# Patient Record
Sex: Male | Born: 1965
Health system: Southern US, Community
[De-identification: ages and names within clinical notes are randomized; demographics above are authoritative.]

## PROBLEM LIST (undated history)

## (undated) DIAGNOSIS — M1990 Unspecified osteoarthritis, unspecified site: Secondary | ICD-10-CM

## (undated) DIAGNOSIS — M25519 Pain in unspecified shoulder: Secondary | ICD-10-CM

## (undated) DIAGNOSIS — Z860100 Personal history of colon polyps, unspecified: Secondary | ICD-10-CM

## (undated) DIAGNOSIS — Z8709 Personal history of other diseases of the respiratory system: Secondary | ICD-10-CM

## (undated) DIAGNOSIS — Z8739 Personal history of other diseases of the musculoskeletal system and connective tissue: Secondary | ICD-10-CM

## (undated) DIAGNOSIS — K5140 Inflammatory polyps of colon without complications: Secondary | ICD-10-CM

## (undated) DIAGNOSIS — T7840XA Allergy, unspecified, initial encounter: Secondary | ICD-10-CM

## (undated) DIAGNOSIS — J45909 Unspecified asthma, uncomplicated: Secondary | ICD-10-CM

## (undated) DIAGNOSIS — S62101A Fracture of unspecified carpal bone, right wrist, initial encounter for closed fracture: Secondary | ICD-10-CM

## (undated) DIAGNOSIS — K625 Hemorrhage of anus and rectum: Secondary | ICD-10-CM

## (undated) DIAGNOSIS — K529 Noninfective gastroenteritis and colitis, unspecified: Secondary | ICD-10-CM

## (undated) HISTORY — DX: Pain in unspecified shoulder: M25.519

## (undated) HISTORY — PX: POLYPECTOMY: SHX149

## (undated) HISTORY — PX: OTHER SURGICAL HISTORY: SHX169

## (undated) HISTORY — PX: ROTATOR CUFF REPAIR: SHX139

## (undated) HISTORY — DX: Inflammatory polyps of colon without complications: K51.40

## (undated) HISTORY — PX: COLONOSCOPY: SHX174

## (undated) HISTORY — DX: Hemorrhage of anus and rectum: K62.5

## (undated) HISTORY — DX: Allergy, unspecified, initial encounter: T78.40XA

## (undated) HISTORY — DX: Noninfective gastroenteritis and colitis, unspecified: K52.9

---

## 1998-03-09 ENCOUNTER — Emergency Department (HOSPITAL_COMMUNITY): Admission: EM | Admit: 1998-03-09 | Discharge: 1998-03-09 | Payer: Self-pay | Admitting: Emergency Medicine

## 1999-08-07 ENCOUNTER — Emergency Department (HOSPITAL_COMMUNITY): Admission: EM | Admit: 1999-08-07 | Discharge: 1999-08-07 | Payer: Self-pay | Admitting: Emergency Medicine

## 1999-08-10 ENCOUNTER — Emergency Department (HOSPITAL_COMMUNITY): Admission: EM | Admit: 1999-08-10 | Discharge: 1999-08-10 | Payer: Self-pay | Admitting: Emergency Medicine

## 2000-05-28 HISTORY — PX: ANKLE SURGERY: SHX546

## 2001-01-01 ENCOUNTER — Ambulatory Visit (HOSPITAL_BASED_OUTPATIENT_CLINIC_OR_DEPARTMENT_OTHER): Admission: RE | Admit: 2001-01-01 | Discharge: 2001-01-01 | Payer: Self-pay | Admitting: Orthopedic Surgery

## 2004-11-03 ENCOUNTER — Emergency Department (HOSPITAL_COMMUNITY): Admission: EM | Admit: 2004-11-03 | Discharge: 2004-11-03 | Payer: Self-pay | Admitting: Emergency Medicine

## 2006-12-16 ENCOUNTER — Emergency Department (HOSPITAL_COMMUNITY): Admission: EM | Admit: 2006-12-16 | Discharge: 2006-12-16 | Payer: Self-pay | Admitting: Emergency Medicine

## 2008-05-07 ENCOUNTER — Ambulatory Visit (HOSPITAL_BASED_OUTPATIENT_CLINIC_OR_DEPARTMENT_OTHER): Admission: RE | Admit: 2008-05-07 | Discharge: 2008-05-07 | Payer: Self-pay | Admitting: Orthopedic Surgery

## 2008-05-07 HISTORY — PX: SHOULDER ARTHROSCOPY W/ SUBACROMIAL DECOMPRESSION AND DISTAL CLAVICLE EXCISION: SHX2401

## 2008-05-28 HISTORY — PX: SHOULDER SURGERY: SHX246

## 2009-05-10 ENCOUNTER — Emergency Department (HOSPITAL_COMMUNITY): Admission: EM | Admit: 2009-05-10 | Discharge: 2009-05-10 | Payer: Self-pay | Admitting: Emergency Medicine

## 2010-02-01 ENCOUNTER — Encounter (HOSPITAL_BASED_OUTPATIENT_CLINIC_OR_DEPARTMENT_OTHER): Admission: RE | Admit: 2010-02-01 | Discharge: 2010-02-07 | Payer: Self-pay | Admitting: Internal Medicine

## 2010-08-29 LAB — POCT CARDIAC MARKERS
CKMB, poc: <1 ng/mL — ABNORMAL LOW (ref 1.0–8.0)
Myoglobin, poc: 69.9 ng/mL (ref 12–200)
Troponin i, poc: <0.05 ng/mL (ref 0.00–0.09)

## 2010-08-29 LAB — TROPONIN I: Troponin I: 0.01 ng/mL (ref 0.00–0.06)

## 2010-08-29 LAB — D-DIMER, QUANTITATIVE: D-Dimer, Quant: 0.23 ug/mL-FEU (ref 0.00–0.48)

## 2010-10-07 ENCOUNTER — Emergency Department (HOSPITAL_COMMUNITY)
Admission: EM | Admit: 2010-10-07 | Discharge: 2010-10-08 | Disposition: A | Discharge status: Home / Self Care | Payer: Medicaid Other | Attending: Emergency Medicine | Admitting: Emergency Medicine

## 2010-10-07 DIAGNOSIS — K644 Residual hemorrhoidal skin tags: Secondary | ICD-10-CM | POA: Insufficient documentation

## 2010-10-07 DIAGNOSIS — K6289 Other specified diseases of anus and rectum: Secondary | ICD-10-CM | POA: Insufficient documentation

## 2010-10-10 NOTE — Op Note (Signed)
Randy Wilson, Randy Wilson                  ACCOUNT NO.:  [id number]   MEDICAL RECORD NO.:  [id number]          PATIENT TYPE:  AMB   LOCATION:  DSC                          FACILITY:  [facility]   PHYSICIAN:  Alta Corning, M.D.   DATE OF BIRTH:  Nov 18, 1965   DATE OF PROCEDURE:  05/07/2008  DATE OF DISCHARGE:                               OPERATIVE REPORT   PREOPERATIVE DIAGNOSES:  1. Impingement and acromioclavicular joint arthritis.  2. Suspect of the labral tear.   POSTOPERATIVE DIAGNOSES:  1. Impingement and acromioclavicular joint arthritis.  2. Suspect of the labral tear.  3. Superior labral tear anterior to posterior.   PERTINENT PROCEDURE:  1. Arthroscopic subacromial decompression for lateral and posterior      compartment.  2. Arthroscopic distal clavicle resection for the anterior      compartment.  3. Arthroscopic debridement of labral tear superior with anterior to      posterior extensions within the glenohumeral joint.   SURGEON:  Alta Corning, M.D.   ASSISTANT:  Gary Fleet, P.A.   ANESTHESIA:  General.   BRIEF HISTORY:  Randy Wilson is a gentleman with a long history of having  had significant left shoulder pain which was treated conservatively for  a period of time.  Injection therapy had controlled his pain quite well  and because of continuing complaints of pain and failure of conservative  care, he was also taken to the operating room for subacromial  decompression, distal clavicle resection, and evaluation of rotator  cuff.   PROCEDURE:  The patient was taken to the operating room after adequate  anesthesia was obtained under general anesthetic.  The patient was  placed supine on the operating table.  The left arm was prepped and  draped in usual sterile fashion.  After he was moved to the beach chair  position, all bony prominences were well padded.  After routine prep and  drape as outlined, the arthroscopic examination was performed and showed  that there  was an obvious labral tear anterior.  This went around under  the biceps tendon into the posterior superior region.  The biceps tendon  appear to be well anchored and could not be pushed down into the joint.  Once this was completed, attention was turned towards the rotator cuff  undersurface which had no evidence of full thickness tearing or even  high-grade partial.  The glenohumeral joint was then inspected and noted  to have no significant arthritic change.  After this, attention was  turned out to the glenohumeral joint and the subacromial space, an  arthroscopic subacromial decompression was performed from a lateral and  posterior compartment.  Once it was completed, attention was then turned  towards the clavicle where the distal clavicle was excised over 18 mm.  Once this was completed, an ArthroCare bovie was used to burn the end of  the clavicle to try to prevent any kind of regrowth.  Once this was  completed, attention was turned to the bursa.  The subtotal bursectomy  was performed and the rotator cuff was evaluated from  top side.  There  was some partial thickness tearing from the top side, but this was  probed at length and did not appear to have any full thickness extension  or even high-grade partial extension.  At this point, the shoulder was  copiously and thoroughly irrigated and suctioned dry.  The shoulder  portals were closed with a bandage.  Sterile compressive dressing was  applied and the patient taken to recovery and was noted to be in  satisfactory condition.  Estimated blood loss for this procedure was  none.      Alta Corning, M.D.  Electronically Signed     JLG/MEDQ  D:  05/07/2008  T:  05/07/2008  Job:  [id number]

## 2010-10-13 NOTE — Op Note (Signed)
. [facility]  Patient:    Randy Wilson, Randy Wilson                         MRN: [id number] Adm. Date:  [id number] Attending:  Debroah Baller                           Operative Report  Good morning, this is Frederik Pear dictating an operative note on [name]. Assuming this is the same person handling the dictation, the one I dictated on [name], medical record number [id number], was actually [name], medical record number [phone].  Please change the identity on the dictation that was on [name] to [employer]. DD:  01/01/01 TD:  01/02/01 Job: [id number]

## 2011-01-22 ENCOUNTER — Encounter (INDEPENDENT_AMBULATORY_CARE_PROVIDER_SITE_OTHER): Payer: Self-pay | Admitting: General Surgery

## 2011-01-24 ENCOUNTER — Ambulatory Visit (INDEPENDENT_AMBULATORY_CARE_PROVIDER_SITE_OTHER): Payer: Medicaid Other | Admitting: General Surgery

## 2011-01-24 ENCOUNTER — Encounter (INDEPENDENT_AMBULATORY_CARE_PROVIDER_SITE_OTHER): Payer: Self-pay | Admitting: General Surgery

## 2011-01-24 VITALS — BP 126/84 | HR 62 | Temp 97.0°F | Ht 74.25 in | Wt 222.0 lb

## 2011-01-24 DIAGNOSIS — K648 Other hemorrhoids: Secondary | ICD-10-CM

## 2011-01-24 NOTE — Progress Notes (Signed)
Chief Complaint  Patient presents with  . Other    new pt -eval of hemorrhoids     HPI Randy Wilson is a 45 y.o. male.  This patient is referred by Dr. Kenton Kingfisher for evaluation of bleeding and painful hemorrhoids. He has a history of what sounds like a thrombosed hemorrhoid which was treated with incision and drainage approximately 10 or 11 years ago. He has been fine since this time until approximately 4 months ago. At that time he began having increased bleeding and pain with bowel movements as well as bright red blood into the bowl with bowel movements. He had been taking Colace once a day and moves his bowels every other day. If he does not take Colace he has hard stools. He has been taking Advil for discomfort as well as taking suppositories, Tucks, as well as sitz baths. He has no family history of colon cancer and has had a normal colonoscopy roughly 6 years ago. HPI  Past Medical History  Diagnosis Date  . Asthma   . Shoulder pain   . Hemorrhoids   . Rectal bleeding     Past Surgical History  Procedure Date  . Shoulder surgery 2010    bone spurs removed   . Ankle surgery 2002    bone spurs removed    Family History  Problem Relation Age of Onset  . Asthma Mother 58    asthma attack     Social History History  Substance Use Topics  . Smoking status: Former Smoker    Quit date: 01/21/2006  . Smokeless tobacco: Former Systems developer  . Alcohol Use: 3.6 oz/week    6 Cans of beer per week    No Known Allergies  Current Outpatient Prescriptions  Medication Sig Dispense Refill  . PREDNISONE PO Take by mouth daily. Taking for poison ivy       . albuterol (PROAIR HFA) 108 (90 BASE) MCG/ACT inhaler Inhale 2 puffs into the lungs every 4 (four) hours as needed.        . triamcinolone (KENALOG) 0.1 % cream Apply topically 2 (two) times daily.          Review of Systems Review of Systems  Constitutional: Negative.   HENT: Negative.   Eyes: Negative.   Respiratory: Negative.     Cardiovascular: Negative.   Gastrointestinal: Positive for constipation and blood in stool.  Genitourinary: Negative.   Skin: Negative.   Endo/Heme/Allergies: Negative.   Psychiatric/Behavioral: Negative.   All other systems reviewed and are negative.    Blood pressure 126/84, pulse 62, temperature 97 F (36.1 C), height 6' 2.25" (1.886 m), weight 222 lb (100.699 kg).  Physical Exam Physical Exam  Constitutional: He is oriented to person, place, and time. He appears well-developed and well-nourished. No distress.  HENT:  Head: Normocephalic and atraumatic.  Mouth/Throat: No oropharyngeal exudate.  Eyes: Conjunctivae and EOM are normal. Right eye exhibits no discharge. Left eye exhibits no discharge. No scleral icterus.  Neck: Normal range of motion. No tracheal deviation present.  Cardiovascular: Normal rate, regular rhythm and normal heart sounds.   Respiratory: Effort normal and breath sounds normal. No respiratory distress. He has no wheezes.  GI: Soft. Bowel sounds are normal. He exhibits distension. He exhibits no mass. There is no tenderness. There is no guarding.  Genitourinary:       He has some external hemorrhoids visible greatest on the left lateral column. He is tight rectal tone and no internal masses no evidence of  bleeding no evidence of fissure tender rectal exam.  Musculoskeletal: Normal range of motion.  Neurological: He is alert and oriented to person, place, and time.  Skin: Skin is warm and dry. No rash noted. He is not diaphoretic. No erythema. No pallor.  Psychiatric: He has a normal mood and affect. His behavior is normal. Judgment and thought content normal.     Data Reviewed   Assessment    Internal and external hemorrhoids with bleeding and pain. At this point he has tried conservative management and is still having significant symptoms. I did recommend that he increase his fiber intake to 20-30 g of fiber daily as well as increase his water intake  and recommended that he increase his Colace twice daily if needed in order to titrate the effect to one bowel movement per day. I also discussed with him the options for surgical management of hemorrhoids. I think that he be a reasonable candidate for hemorrhoidectomy at this time. I did offer hemorrhoidectomy but at this time, I recommend that he try these conservative measures and if he desires to proceed with hemorrhoidectomy, then to give me a call.     Plan    He will continue with current conservative management and if he desires to proceed with hemorrhoidectomy and he will call me back in 3-4 weeks if he is ready for procedure       [place], [place] 01/24/2011, 9:36 AM

## 2013-09-23 ENCOUNTER — Ambulatory Visit: Payer: No Typology Code available for payment source

## 2013-09-23 ENCOUNTER — Ambulatory Visit (INDEPENDENT_AMBULATORY_CARE_PROVIDER_SITE_OTHER): Payer: No Typology Code available for payment source | Admitting: Family Medicine

## 2013-09-23 VITALS — BP 126/74 | HR 60 | Temp 98.2°F | Resp 17 | Ht 72.5 in | Wt 232.0 lb

## 2013-09-23 DIAGNOSIS — M940 Chondrocostal junction syndrome [Tietze]: Secondary | ICD-10-CM

## 2013-09-23 DIAGNOSIS — R079 Chest pain, unspecified: Secondary | ICD-10-CM

## 2013-09-23 NOTE — Patient Instructions (Signed)
It seems that you have strained the joint between your sternum and ribs (costochondriits).  Continue to use aleve as needed.  If your pain is not better in about one week or if it gets worse please let me know.    Costochondritis Costochondritis, sometimes called Tietze syndrome, is a swelling and irritation (inflammation) of the tissue (cartilage) that connects your ribs with your breastbone (sternum). It causes pain in the chest and rib area. Costochondritis usually goes away on its own over time. It can take up to 6 weeks or longer to get better, especially if you are unable to limit your activities. CAUSES  Some cases of costochondritis have no known cause. Possible causes include:  Injury (trauma).  Exercise or activity such as lifting.  Severe coughing. SIGNS AND SYMPTOMS  Pain and tenderness in the chest and rib area.  Pain that gets worse when coughing or taking deep breaths.  Pain that gets worse with specific movements. DIAGNOSIS  Your health care provider will do a physical exam and ask about your symptoms. Chest X-rays or other tests may be done to rule out other problems. TREATMENT  Costochondritis usually goes away on its own over time. Your health care provider may prescribe medicine to help relieve pain. HOME CARE INSTRUCTIONS   Avoid exhausting physical activity. Try not to strain your ribs during normal activity. This would include any activities using chest, abdominal, and side muscles, especially if heavy weights are used.  Apply ice to the affected area for the first 2 days after the pain begins.  Put ice in a plastic bag.  Place a towel between your skin and the bag.  Leave the ice on for 20 minutes, 2 3 times a day.  Only take over-the-counter or prescription medicines as directed by your health care provider. SEEK MEDICAL CARE IF:  You have redness or swelling at the rib joints. These are signs of infection.  Your pain does not go away despite rest or  medicine. SEEK IMMEDIATE MEDICAL CARE IF:   Your pain increases or you are very uncomfortable.  You have shortness of breath or difficulty breathing.  You cough up blood.  You have worse chest pains, sweating, or vomiting.  You have a fever or persistent symptoms for more than 2 3 days.  You have a fever and your symptoms suddenly get worse. MAKE SURE YOU:   Understand these instructions.  Will watch your condition.  Will get help right away if you are not doing well or get worse. Document Released: 02/21/2005 Document Revised: 03/04/2013 Document Reviewed: 12/16/2012 [facility] Patient Information 2014 [place].

## 2013-09-23 NOTE — Progress Notes (Signed)
[facility] [address], [place] [address] [phone]  Date:  09/23/2013   Name:  Randy Wilson   DOB:  08-21-1965   MRN:  [id number]  PCP:  Shirline Frees, MD    Chief Complaint: Chest Pain   History of Present Illness:  Randy Wilson is a 48 y.o. very pleasant male patient who presents with the following:  Today is Monday. He is here today with pain in his chest for about 3 days.  It feels "like I popped something loose."  He was working out on Saturday and did some chest work.  Started to notice the pain on Sunday or Monday.  The pain is present when he moves his chest or moves his arms.  Sitting at rest and still is ok.   He tried some aleve which did help He does not have any SOB He is generally healthy No history of heart problems.  No family history of heart problems.  He is quite active, usually able to exercise (run, etc) without any problem.  He has not noted any exertional CP.   Quit smoking nearly 10 years ago  There are no active problems to display for this patient.   Past Medical History  Diagnosis Date  . Asthma   . Shoulder pain   . Hemorrhoids   . Rectal bleeding     Past Surgical History  Procedure Laterality Date  . Shoulder surgery  2010    bone spurs removed   . Ankle surgery  2002    bone spurs removed    History  Substance Use Topics  . Smoking status: Former Smoker    Quit date: 01/21/2006  . Smokeless tobacco: Former Systems developer  . Alcohol Use: 3.6 oz/week    6 Cans of beer per week    Family History  Problem Relation Age of Onset  . Asthma Mother 72    asthma attack     No Known Allergies  Medication list has been reviewed and updated.  No current outpatient prescriptions on file prior to visit.   No current facility-administered medications on file prior to visit.    Review of Systems:  As per HPI- otherwise negative.   Physical Examination: Filed Vitals:   09/23/13 1022  BP: 126/74  Pulse: 60   Temp: 98.2 F (36.8 C)  Resp: 17   Filed Vitals:   09/23/13 1022  Height: 6' 0.5" (1.842 m)  Weight: 232 lb (105.235 kg)   Body mass index is 31.02 kg/(m^2). Ideal Body Weight: Weight in (lb) to have BMI = 25: 186.5  GEN: WDWN, NAD, Non-toxic, A & O x 3, looks well, fit build HEENT: Atraumatic, Normocephalic. Neck supple. No masses, No LAD. Ears and Nose: No external deformity. CV: RRR, No M/G/R. No JVD. No thrill. No extra heart sounds. PULM: CTA B, no wheezes, crackles, rhonchi. No retractions. No resp. distress. No accessory muscle use. He indicates pain over the left costochondral joints.  He is able to reproduce this pain by abducting his right arm.  No redness, swelling or evidence of cellulitis. EXTR: No c/c/e NEURO Normal gait.  PSYCH: Normally interactive. Conversant. Not depressed or anxious appearing.  Calm demeanor.   EKG: NSR, no ST elevation or depression  [facility] reading (PRIMARY) by  Dr. Lorelei Pont. SWN:IOEVOJJK Sternum: negative  CHEST 2 VIEW  COMPARISON: 05/10/2009  FINDINGS: Heart and mediastinal contours are within normal limits. No focal opacities or effusions. No acute bony abnormality.  IMPRESSION: No active cardiopulmonary disease.  STERNUM - 2+ VIEW  COMPARISON: Chest x-ray performed today.  FINDINGS: The sternum is actually better visualized on today's chest x-ray. No focal bony abnormality. No fracture or focal bone lesion.  IMPRESSION: No acute bony abnormality. d Assessment and Plan: Chest pain - Plan: EKG 12-Lead, DG Chest 2 View, DG Sternum  Costochondritis discussed in detail with pt.  Suspect his pain is MSK in origin, likely costochondritis.  He will continue to use aleve as needed, and expect he will be feeling better in the next few days.  Discussed the less likely possibility of an acute heart problem.  Offered to have him seen at the ED for a CP rule- out.  He declines at this time but will seek care for any worsening or  different CP  Signed Lamar Blinks, MD

## 2014-08-06 ENCOUNTER — Encounter: Payer: Self-pay | Admitting: Gastroenterology

## 2014-08-23 ENCOUNTER — Encounter (HOSPITAL_COMMUNITY): Payer: Self-pay | Admitting: Emergency Medicine

## 2014-08-23 ENCOUNTER — Emergency Department (HOSPITAL_COMMUNITY)
Admission: EM | Admit: 2014-08-23 | Discharge: 2014-08-24 | Disposition: A | Discharge status: Home / Self Care | Payer: 59 | Attending: Emergency Medicine | Admitting: Emergency Medicine

## 2014-08-23 DIAGNOSIS — Z87891 Personal history of nicotine dependence: Secondary | ICD-10-CM | POA: Insufficient documentation

## 2014-08-23 DIAGNOSIS — R519 Headache, unspecified: Secondary | ICD-10-CM

## 2014-08-23 DIAGNOSIS — Z8719 Personal history of other diseases of the digestive system: Secondary | ICD-10-CM | POA: Insufficient documentation

## 2014-08-23 DIAGNOSIS — R51 Headache: Secondary | ICD-10-CM | POA: Insufficient documentation

## 2014-08-23 DIAGNOSIS — Z8739 Personal history of other diseases of the musculoskeletal system and connective tissue: Secondary | ICD-10-CM | POA: Diagnosis not present

## 2014-08-23 DIAGNOSIS — J45909 Unspecified asthma, uncomplicated: Secondary | ICD-10-CM | POA: Diagnosis not present

## 2014-08-23 DIAGNOSIS — Z79899 Other long term (current) drug therapy: Secondary | ICD-10-CM | POA: Diagnosis not present

## 2014-08-23 NOTE — ED Notes (Signed)
Pt states has had a headache since yesterday, has taken multiple medications w/ no relief.

## 2014-08-23 NOTE — ED Provider Notes (Signed)
CSN: [id number]     Arrival date & time 08/23/14  2027 History   First MD Initiated Contact with Patient 08/23/14 2336     Chief Complaint  Patient presents with  . Headache     (Consider location/radiation/quality/duration/timing/severity/associated sxs/prior Treatment) HPI  This is a 49 year old male without a history of headaches. He is here with a headache that began yesterday evening about 7:30. The onset was fairly sudden. He describes the headache as feeling like someone is drilling a hole in the side of his head. It is located in the left temple and periorbital region. He taken Advil and Tylenol without relief. He describes the pain as severe. It is associated with photophobia, blurred vision and nausea but no vomiting. He denies any focal neurologic deficits.  Past Medical History  Diagnosis Date  . Asthma   . Shoulder pain   . Hemorrhoids   . Rectal bleeding    Past Surgical History  Procedure Laterality Date  . Shoulder surgery  2010    bone spurs removed   . Ankle surgery  2002    bone spurs removed   Family History  Problem Relation Age of Onset  . Asthma Mother 36    asthma attack    History  Substance Use Topics  . Smoking status: Former Smoker    Quit date: 01/21/2006  . Smokeless tobacco: Former Systems developer  . Alcohol Use: 3.6 oz/week    6 Cans of beer per week    Review of Systems  All other systems reviewed and are negative.   Allergies  Review of patient's allergies indicates no known allergies.  Home Medications   Prior to Admission medications   Medication Sig Start Date End Date Taking? Authorizing Provider  cetirizine (ZYRTEC) 10 MG tablet Take 10 mg by mouth daily.    Historical Provider, MD   BP 143/93 mmHg  Pulse 58  Temp(Src) 97.7 F (36.5 C) (Oral)  Resp 18  SpO2 99%   Physical Exam  General: Well-developed, well-nourished male in no acute distress; appearance consistent with age of record HENT: normocephalic; atraumatic Eyes:  pupils equal, round and reactive to light; extraocular muscles intact; photophobia Neck: supple Heart: regular rate and rhythm Lungs: clear to auscultation bilaterally Abdomen: soft; nondistended; nontender; bowel sounds present Extremities: No deformity; full range of motion; pulses normal Neurologic: Awake, alert and oriented; motor function intact in all extremities and symmetric; no facial droop; normal coordination speech; negative Romberg; normal finger to nose Skin: Warm and dry Psychiatric: Flat affect    ED Course  Procedures (including critical care time)   MDM  Nursing notes and vitals signs, including pulse oximetry, reviewed.  Summary of this visit's results, reviewed by myself:  Imaging Studies: Ct Head Wo Contrast  08/24/2014   CLINICAL DATA:  Acute onset of left temporal and parietal headache for 2 days. Initial encounter.  EXAM: CT HEAD WITHOUT CONTRAST  TECHNIQUE: Contiguous axial images were obtained from the base of the skull through the vertex without intravenous contrast.  COMPARISON:  None.  FINDINGS: There is no evidence of acute infarction, mass lesion, or intra- or extra-axial hemorrhage on CT.  The posterior fossa, including the cerebellum, brainstem and fourth ventricle, is within normal limits. The third and lateral ventricles, and basal ganglia are unremarkable in appearance. The cerebral hemispheres are symmetric in appearance, with normal gray-white differentiation. No mass effect or midline shift is seen.  There is no evidence of fracture; visualized osseous structures are unremarkable in appearance.  The orbits are within normal limits. The paranasal sinuses and mastoid air cells are well-aerated. No significant soft tissue abnormalities are seen.  IMPRESSION: Unremarkable noncontrast CT of the head.   Electronically Signed   By: Garald Balding M.D.   On: 08/24/2014 01:58   2:02 AM Headache resolved after IV fluids, IV Reglan and Benadryl and oxygen 100% by  nonrebreather. His relief with oxygen suggest cluster headache.     Shanon Rosser, MD 08/24/14 [phone]

## 2014-08-23 NOTE — ED Notes (Signed)
Pt states that he has a headache that started last night. Blurred vision. Alert and oriented.

## 2014-08-24 ENCOUNTER — Emergency Department (HOSPITAL_COMMUNITY): Payer: 59

## 2014-08-24 ENCOUNTER — Ambulatory Visit (AMBULATORY_SURGERY_CENTER): Payer: Self-pay | Admitting: *Deleted

## 2014-08-24 VITALS — Ht 74.0 in | Wt 241.4 lb

## 2014-08-24 DIAGNOSIS — Z1211 Encounter for screening for malignant neoplasm of colon: Secondary | ICD-10-CM

## 2014-08-24 MED ORDER — SODIUM CHLORIDE 0.9 % IV BOLUS (SEPSIS)
1000.0000 mL | Freq: Once | INTRAVENOUS | Status: AC
  Administered 2014-08-24: 1000 mL via INTRAVENOUS

## 2014-08-24 MED ORDER — DIPHENHYDRAMINE HCL 50 MG/ML IJ SOLN
25.0000 mg | Freq: Once | INTRAMUSCULAR | Status: AC
  Administered 2014-08-24: 25 mg via INTRAVENOUS
  Filled 2014-08-24: qty 1

## 2014-08-24 MED ORDER — METOCLOPRAMIDE HCL 5 MG/ML IJ SOLN
10.0000 mg | Freq: Once | INTRAMUSCULAR | Status: AC
  Administered 2014-08-24: 10 mg via INTRAVENOUS
  Filled 2014-08-24: qty 2

## 2014-08-24 MED ORDER — NA SULFATE-K SULFATE-MG SULF 17.5-3.13-1.6 GM/177ML PO SOLN: ORAL | Status: DC

## 2014-08-24 NOTE — Progress Notes (Signed)
No egg or soy allergy  No anesthesia or intubation problems per pt  No diet medications taken

## 2014-09-07 ENCOUNTER — Other Ambulatory Visit: Payer: Self-pay

## 2014-09-07 ENCOUNTER — Encounter: Payer: Self-pay | Admitting: Gastroenterology

## 2014-09-07 ENCOUNTER — Ambulatory Visit (AMBULATORY_SURGERY_CENTER): Payer: 59 | Admitting: Gastroenterology

## 2014-09-07 VITALS — BP 144/84 | HR 66 | Temp 97.7°F | Resp 19 | Ht 74.0 in | Wt 241.0 lb

## 2014-09-07 DIAGNOSIS — D120 Benign neoplasm of cecum: Secondary | ICD-10-CM

## 2014-09-07 DIAGNOSIS — K635 Polyp of colon: Secondary | ICD-10-CM | POA: Diagnosis not present

## 2014-09-07 DIAGNOSIS — Z1211 Encounter for screening for malignant neoplasm of colon: Secondary | ICD-10-CM

## 2014-09-07 MED ORDER — SODIUM CHLORIDE 0.9 % IV SOLN: 500.0000 mL | INTRAVENOUS | Status: DC

## 2014-09-07 NOTE — Progress Notes (Signed)
Called to room to assist during endoscopic procedure.  Patient ID and intended procedure confirmed with present staff. Received instructions for my participation in the procedure from the performing physician.  

## 2014-09-07 NOTE — Op Note (Signed)
[facility]  [address]. [place], [address]   COLONOSCOPY PROCEDURE REPORT  PATIENT: Randy Wilson, Randy Wilson  MR#: [id number] BIRTHDATE: [birth year] , 49  yrs. old GENDER: male ENDOSCOPIST: Inda Castle, MD REFERRED CA:REQJEAD Tisovec, M.D. PROCEDURE DATE:  09/07/2014 PROCEDURE:   Colonoscopy with biopsy, Colonoscopy with snare polypectomy, and Colonoscopy, screening First Screening Colonoscopy - Avg.  risk and is 50 yrs.  old or older Yes.  Prior Negative Screening - Now for repeat screening. N/A  History of Adenoma - Now for follow-up colonoscopy & has been > or = to 3 yrs.  N/A ASA CLASS:   Class I INDICATIONS:Colorectal Neoplasm Risk Assessment for this procedure is average risk. MEDICATIONS: Monitored anesthesia care and Propofol 450 mg IV  DESCRIPTION OF PROCEDURE:   After the risks benefits and alternatives of the procedure were thoroughly explained, informed consent was obtained.  The digital rectal exam revealed no abnormalities of the rectum.   The LB [id number]  endoscope was introduced through the anus and advanced to the ileum. No adverse events experienced.   The quality of the prep was (Suprep was used) good.  The instrument was then slowly withdrawn as the colon was fully examined.      COLON FINDINGS: A polypoid shaped mass measuring 3 X 5cm in size with friable surfaces was found at the ileocecal valve.  A polypectomy was performed in a piecemeal fashion using snare cautery.  The resection was incomplete but the polyp tissue was completely retrieved and sent to histology.  there appeared to be extension into the terminal ileum.  Nodular mucosa was noted in the terminal ileum.  Biopsies were taken. Internal hemorrhoids were found. The remainder of the colon was normal. Retroflexed views revealed no abnormalities. The time to cecum = 3.0 Withdrawal time = 28.4   The scope was withdrawn and the procedure completed. COMPLICATIONS: There were  no immediate complications.  ENDOSCOPIC IMPRESSION: mass (polyp) at the ileocecal valve extending into the terminal ileum Internal hemorrhoids  RECOMMENDATIONS: surgical consultation for resection Colonoscopy one year To consider band ligation of internal hemorrhoids at a later date  eSigned:  Inda Castle, MD 09/07/2014 2:22 PM   cc:   PATIENT NAME:  Nichalos, Brenton MR#: [id number]

## 2014-09-07 NOTE — Progress Notes (Signed)
Awake and alert. Pleased with MAC, Report to RN

## 2014-09-07 NOTE — Patient Instructions (Addendum)
YOU HAD AN ENDOSCOPIC PROCEDURE TODAY AT [facility]:   Refer to the procedure report that was given to you for any specific questions about what was found during the examination.  If the procedure report does not answer your questions, please call your gastroenterologist to clarify.  If you requested that your care partner not be given the details of your procedure findings, then the procedure report has been included in a sealed envelope for you to review at your convenience later.  YOU SHOULD EXPECT: Some feelings of bloating in the abdomen. Passage of more gas than usual.  Walking can help get rid of the air that was put into your GI tract during the procedure and reduce the bloating. If you had a lower endoscopy (such as a colonoscopy or flexible sigmoidoscopy) you may notice spotting of blood in your stool or on the toilet paper. If you underwent a bowel prep for your procedure, you may not have a normal bowel movement for a few days.  Please Note:  You might notice some irritation and congestion in your nose or some drainage.  This is from the oxygen used during your procedure.  There is no need for concern and it should clear up in a day or so.  SYMPTOMS TO REPORT IMMEDIATELY:   Following lower endoscopy (colonoscopy or flexible sigmoidoscopy):  Excessive amounts of blood in the stool  Significant tenderness or worsening of abdominal pains  Swelling of the abdomen that is new, acute  Fever of 100F or higher   For urgent or emergent issues, a gastroenterologist can be reached at any hour by calling [phone].   DIET: Your first meal following the procedure should be a small meal and then it is ok to progress to your normal diet. Heavy or fried foods are harder to digest and may make you feel nauseous or bloated.  Likewise, meals heavy in dairy and vegetables can increase bloating.  Drink plenty of fluids but you should avoid alcoholic beverages for 24  hours.  ACTIVITY:  You should plan to take it easy for the rest of today and you should NOT DRIVE or use heavy machinery until tomorrow (because of the sedation medicines used during the test).    FOLLOW UP: Our staff will call the number listed on your records the next business day following your procedure to check on you and address any questions or concerns that you may have regarding the information given to you following your procedure. If we do not reach you, we will leave a message.  However, if you are feeling well and you are not experiencing any problems, there is no need to return our call.  We will assume that you have returned to your regular daily activities without incident.  If any biopsies were taken you will be contacted by phone or by letter within the next 1-3 weeks.  Please call us at [phone] if you have not heard about the biopsies in 3 weeks.    SIGNATURES/CONFIDENTIALITY: You and/or your care partner have signed paperwork which will be entered into your electronic medical record.  These signatures attest to the fact that that the information above on your After Visit Summary has been reviewed and is understood.  Full responsibility of the confidentiality of this discharge information lies with you and/or your care-partner.  Recommendations Discharge instructions given to patient and/or care partner. Next colonoscopy in 1 year. Appointment with surgeon to be scheduled. Polyp handout provided.

## 2014-09-08 ENCOUNTER — Telehealth: Payer: Self-pay | Admitting: *Deleted

## 2014-09-08 ENCOUNTER — Telehealth: Payer: Self-pay | Admitting: Gastroenterology

## 2014-09-08 NOTE — Telephone Encounter (Signed)
  Follow up Call-  Call back number 09/07/2014  Post procedure Call Back phone  # [phone]  Permission to leave phone message Yes     Patient questions:  Do you have a fever, pain , or abdominal swelling? No. Pain Score  0 *  Have you tolerated food without any problems? Yes.    Have you been able to return to your normal activities? Yes.    Do you have any questions about your discharge instructions: Diet   No. Medications  No. Follow up visit  No.  Do you have questions or concerns about your Care? No.  Actions: * If pain score is 4 or above: No action needed, pain <4.

## 2014-09-08 NOTE — Telephone Encounter (Signed)
Left message for the patient. His PCP has been notified of the need for the referral to [facility], Dr Jonita Albee and his endoscopy report has been faxed. Awaiting notification of the referral from Maudie Mercury (2 message left on her voicemail).

## 2014-09-09 NOTE — Telephone Encounter (Signed)
PCP has done the referral for patient's insurance to assure his visits and surgery will be covered by his plan. Referral coordinator at [place] Becky Sax) is working on the appointments. She will call when she has this scheduled and I will notify the patient.

## 2014-09-10 ENCOUNTER — Other Ambulatory Visit: Payer: Self-pay | Admitting: *Deleted

## 2014-09-10 ENCOUNTER — Telehealth: Payer: Self-pay | Admitting: *Deleted

## 2014-09-10 DIAGNOSIS — K635 Polyp of colon: Secondary | ICD-10-CM

## 2014-09-10 NOTE — Telephone Encounter (Signed)
Dr Deatra Ina, Eustaquio Maize is in the middle of scheduling this appointment for patient, Patient is having insurance issues with the referral. Im confused about this. So the patient dont have cancer????  Im forwarding this message to you and beth.

## 2014-09-10 NOTE — Telephone Encounter (Signed)
Spoke with Dr Deatra Ina,   Scheduled patient an office visit for 09/17/2014   Dr Deatra Ina to discuss colon polyp and biospy with patient  Patient to have labs next week. Beth will contact the patient   Per Dr Deatra Ina, Pers[facility] Proud referral appointment with Dr Marcello Moores in May

## 2014-09-10 NOTE — Telephone Encounter (Signed)
==  View-only below this line===  ----- Message -----    From: Inda Castle, MD    Sent: 09/08/2014   9:27 AM      To: Tania Ade, RN, Oda Kilts, CMA  Shirlean Mylar, Please cancel this patient's appointment with Dr. Marcello Moores.  Inform him that there are no cancer cells or precancerous changes on the biopsies and that I am going to talk with other doctors about what, if anything, to do about the polyp.  He needs a CBC and office visit.  Manuela Schwartz, He is placed this patient on the GI cancer conference list for next week.  Diagnosis-inflammatory polyp

## 2014-09-10 NOTE — Telephone Encounter (Signed)
Patient is aware of an appointment with Dr Marcello Moores on 09/30/14 at 3:40 pm. See telephone note of 09/10/14.

## 2014-09-10 NOTE — Telephone Encounter (Signed)
Dr I see that a referral was done after this message. Do you still need me to cancel this appointment with Dr Marcello Moores. Im confused

## 2014-09-10 NOTE — Telephone Encounter (Signed)
I spoke with the patient. Advised him that the biopsy results show an inflammatory process. He agrees to come in 09/13/14 for CBC and on 09/17/14 at 10:30 to see Dr Deatra Ina. Patient is aware of the consult appointment with Dr Marcello Moores on 09/30/14 at 3:40 pm.

## 2014-09-13 ENCOUNTER — Other Ambulatory Visit (INDEPENDENT_AMBULATORY_CARE_PROVIDER_SITE_OTHER): Payer: 59

## 2014-09-13 DIAGNOSIS — K635 Polyp of colon: Secondary | ICD-10-CM

## 2014-09-13 LAB — CBC WITH DIFFERENTIAL/PLATELET
Basophils Absolute: 0 10*3/uL (ref 0.0–0.1)
Basophils Relative: 0.4 % (ref 0.0–3.0)
Eosinophils Absolute: 0.5 10*3/uL (ref 0.0–0.7)
Eosinophils Relative: 7.4 % — ABNORMAL HIGH (ref 0.0–5.0)
HCT: 40.8 % (ref 39.0–52.0)
Hemoglobin: 13.9 g/dL (ref 13.0–17.0)
Lymphocytes Relative: 24.6 % (ref 12.0–46.0)
Lymphs Abs: 1.7 10*3/uL (ref 0.7–4.0)
MCHC: 33.9 g/dL (ref 30.0–36.0)
MCV: 83.5 fl (ref 78.0–100.0)
Monocytes Absolute: 0.6 10*3/uL (ref 0.1–1.0)
Monocytes Relative: 9.1 % (ref 3.0–12.0)
NEUTROS PCT: 58.5 % (ref 43.0–77.0)
Neutro Abs: 4 10*3/uL (ref 1.4–7.7)
PLATELETS: 258 10*3/uL (ref 150.0–400.0)
RBC: 4.89 Mil/uL (ref 4.22–5.81)
RDW: 14.1 % (ref 11.5–15.5)
WBC: 6.8 10*3/uL (ref 4.0–10.5)

## 2014-09-17 ENCOUNTER — Encounter: Payer: Self-pay | Admitting: Gastroenterology

## 2014-09-17 ENCOUNTER — Ambulatory Visit (INDEPENDENT_AMBULATORY_CARE_PROVIDER_SITE_OTHER): Payer: 59 | Admitting: Gastroenterology

## 2014-09-17 VITALS — BP 110/76 | HR 64 | Ht 72.75 in | Wt 233.2 lb

## 2014-09-17 DIAGNOSIS — Z86010 Personal history of colonic polyps: Secondary | ICD-10-CM | POA: Insufficient documentation

## 2014-09-17 NOTE — Patient Instructions (Signed)
Follow up as needed

## 2014-09-17 NOTE — Progress Notes (Signed)
    _                                                                                                                History of Present Illness:  Randy Wilson is a  49 year old Afro-American male referred at the request of Dr. Rosana Hoes for colorectal cancer screening.  At colonoscopy a large poplyploid polyp was seen and partially removed in the area of the ileocecal valve.  I felt that he could not be removed in its entirety by colonoscopy.  To my surprise allergy demonstrated inflammatory changes only.  There were no adenomatous changes or atypia.  The patient feels well and has no GI complaints.  Recent CBC was normal.   Past Medical History  Diagnosis Date  . Shoulder pain   . Hemorrhoids   . Rectal bleeding   . Allergy    Past Surgical History  Procedure Laterality Date  . Shoulder surgery  2010    bone spurs removed   . Ankle surgery  2002    bone spurs removed   family history includes Asthma (age of onset: 11) in his mother. There is no history of Colon cancer, Stomach cancer, Rectal cancer, or Esophageal cancer. Current Outpatient Prescriptions  Medication Sig Dispense Refill  . cetirizine (ZYRTEC) 10 MG tablet Take 10 mg by mouth daily.    . Naproxen Sodium (ALEVE PO) Take by mouth as needed.     No current facility-administered medications for this visit.   Allergies as of 09/17/2014  . (No Known Allergies)    reports that he quit smoking about 8 years ago. He has quit using smokeless tobacco. He reports that he drinks about 3.6 oz of alcohol per week. He reports that he does not use illicit drugs.   Review of Systems: Pertinent positive and negative review of systems were noted in the above HPI section. All other review of systems were otherwise negative.  Vital signs were reviewed in today's medical record Physical Exam: General: Well developed , well nourished, no acute distress Skin: anicteric Head: Normocephalic and atraumatic Eyes:  sclerae  anicteric, EOMI Ears: Normal auditory acuity Mouth: No deformity or lesions Neck: Supple, no masses or thyromegaly Lymph Nodes: no lymphadenopathy Lungs: Clear throughout to auscultation Heart: Regular rate and rhythm; no murmurs, rubs or bruits Gastroinestinal: Soft, non tender and non distended. No masses, hepatosplenomegaly or hernias noted. Normal Bowel sounds Rectal:deferred Musculoskeletal: Symmetrical with no gross deformities  Skin: No lesions on visible extremities Pulses:  Normal pulses noted Extremities: No clubbing, cyanosis, edema or deformities noted Neurological: Alert oriented x 4, grossly nonfocal Cervical Nodes:  No significant cervical adenopathy Inguinal Nodes: No significant inguinal adenopathy Psychological:  Alert and cooperative. Normal mood and affect  See Assessment and Plan under Problem List

## 2014-09-17 NOTE — Assessment & Plan Note (Signed)
Unresectable (by colonoscopy) inflammatory polyp in the area of the ileocecal valve.  Case was discussed at GI tumor conference.  The consensus was that the polyp should be removed because of its potential for bleeding and obstruction.  He already has appointment to see Dr. Leighton Ruff from [facility] surgery.  Recommend follow-up colonoscopy in 10 years.

## 2014-09-23 ENCOUNTER — Ambulatory Visit (INDEPENDENT_AMBULATORY_CARE_PROVIDER_SITE_OTHER): Payer: 59 | Admitting: Emergency Medicine

## 2014-09-23 ENCOUNTER — Ambulatory Visit (HOSPITAL_COMMUNITY)
Admission: RE | Admit: 2014-09-23 | Discharge: 2014-09-23 | Disposition: A | Discharge status: Home / Self Care | Payer: 59 | Source: Ambulatory Visit | Attending: Emergency Medicine | Admitting: Emergency Medicine

## 2014-09-23 VITALS — BP 130/88 | HR 58 | Temp 97.9°F | Resp 16 | Ht 73.0 in | Wt 233.0 lb

## 2014-09-23 DIAGNOSIS — H538 Other visual disturbances: Secondary | ICD-10-CM | POA: Insufficient documentation

## 2014-09-23 DIAGNOSIS — J0140 Acute pansinusitis, unspecified: Secondary | ICD-10-CM | POA: Diagnosis not present

## 2014-09-23 DIAGNOSIS — R519 Headache, unspecified: Secondary | ICD-10-CM

## 2014-09-23 DIAGNOSIS — R51 Headache: Secondary | ICD-10-CM

## 2014-09-23 MED ORDER — PSEUDOEPHEDRINE-GUAIFENESIN ER 60-600 MG PO TB12: 1.0000 | ORAL_TABLET | Freq: Two times a day (BID) | ORAL | Status: DC

## 2014-09-23 MED ORDER — ACETAMINOPHEN-CODEINE #3 300-30 MG PO TABS: 1.0000 | ORAL_TABLET | ORAL | Status: DC | PRN

## 2014-09-23 MED ORDER — AMOXICILLIN-POT CLAVULANATE 875-125 MG PO TABS: 1.0000 | ORAL_TABLET | Freq: Two times a day (BID) | ORAL | Status: DC
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
## 2014-09-23 NOTE — Patient Instructions (Addendum)
Go to [facility] ER and register as an Outpatient for a MRI Brain without contrast NOW 09/23/14 Insurance prior [place] [phone] Call report [phone]

## 2014-09-24 ENCOUNTER — Telehealth: Payer: Self-pay | Admitting: Radiology

## 2014-09-24 NOTE — Telephone Encounter (Signed)
Called in rx and notified pt.

## 2014-09-28 ENCOUNTER — Other Ambulatory Visit: Payer: Self-pay | Admitting: Emergency Medicine

## 2014-09-28 ENCOUNTER — Telehealth: Payer: Self-pay

## 2014-09-28 DIAGNOSIS — R51 Headache: Principal | ICD-10-CM

## 2014-09-28 DIAGNOSIS — R519 Headache, unspecified: Secondary | ICD-10-CM

## 2014-09-28 NOTE — Telephone Encounter (Signed)
Pt is wanting to be referrered to dr lewitt   Best number is [phone]

## 2014-09-28 NOTE — Telephone Encounter (Signed)
Pt would like to be referred to neuro, Dr Catalina Gravel.

## 2014-09-28 NOTE — Telephone Encounter (Signed)
sent 

## 2014-10-01 ENCOUNTER — Telehealth: Payer: Self-pay | Admitting: Gastroenterology

## 2014-10-01 NOTE — Telephone Encounter (Signed)
Spoke with the patient. He has already been in contact with his insurance. He is waiting information on in-network surgeons that [facility] was going to email to him. He will call me with that information. Dr Samuel Germany of [facility] does participate with [facility].

## 2014-10-04 ENCOUNTER — Other Ambulatory Visit: Payer: Self-pay

## 2014-10-04 DIAGNOSIS — K621 Rectal polyp: Secondary | ICD-10-CM

## 2014-10-04 NOTE — Telephone Encounter (Signed)
Information given to the patient.

## 2014-10-04 NOTE — Telephone Encounter (Signed)
Left message for the patient to call back. Also left the information on Dr Samuel Germany in [facility] who is with [facility]

## 2014-10-05 ENCOUNTER — Ambulatory Visit (INDEPENDENT_AMBULATORY_CARE_PROVIDER_SITE_OTHER): Payer: 59 | Admitting: Family Medicine

## 2014-10-05 VITALS — BP 120/72 | HR 55 | Temp 97.9°F | Resp 18 | Ht 73.0 in | Wt 233.0 lb

## 2014-10-05 DIAGNOSIS — R51 Headache: Secondary | ICD-10-CM | POA: Diagnosis not present

## 2014-10-05 DIAGNOSIS — L608 Other nail disorders: Secondary | ICD-10-CM

## 2014-10-05 DIAGNOSIS — L609 Nail disorder, unspecified: Secondary | ICD-10-CM | POA: Diagnosis not present

## 2014-10-05 DIAGNOSIS — K0889 Other specified disorders of teeth and supporting structures: Secondary | ICD-10-CM

## 2014-10-05 DIAGNOSIS — K088 Other specified disorders of teeth and supporting structures: Secondary | ICD-10-CM | POA: Diagnosis not present

## 2014-10-05 DIAGNOSIS — J0100 Acute maxillary sinusitis, unspecified: Secondary | ICD-10-CM

## 2014-10-05 DIAGNOSIS — R519 Headache, unspecified: Secondary | ICD-10-CM

## 2014-10-05 MED ORDER — AMOXICILLIN-POT CLAVULANATE 875-125 MG PO TABS: 1.0000 | ORAL_TABLET | Freq: Two times a day (BID) | ORAL | Status: DC
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
## 2014-10-29 ENCOUNTER — Ambulatory Visit: Payer: 59 | Admitting: Podiatry

## 2014-11-08 DIAGNOSIS — Z86010 Personal history of colonic polyps: Secondary | ICD-10-CM | POA: Insufficient documentation

## 2014-11-22 ENCOUNTER — Ambulatory Visit: Payer: 59 | Admitting: Family Medicine

## 2014-12-01 ENCOUNTER — Telehealth: Payer: Self-pay | Admitting: Gastroenterology

## 2014-12-01 NOTE — Telephone Encounter (Signed)
Patient was told Dr Phineas Douglas had discussed his results with Dr Deatra Ina. Advised the patient Dr Deatra Ina is out of the office until next week. I contacted Dr Brown Human office. The plan is to contact the patient to give him information about his results. No results are here.

## 2014-12-07 ENCOUNTER — Ambulatory Visit (INDEPENDENT_AMBULATORY_CARE_PROVIDER_SITE_OTHER): Payer: 59

## 2014-12-07 ENCOUNTER — Ambulatory Visit (INDEPENDENT_AMBULATORY_CARE_PROVIDER_SITE_OTHER): Payer: 59 | Admitting: Family Medicine

## 2014-12-07 VITALS — BP 118/76 | HR 62 | Temp 97.7°F | Resp 18 | Ht 74.5 in | Wt 233.6 lb

## 2014-12-07 DIAGNOSIS — M542 Cervicalgia: Secondary | ICD-10-CM | POA: Diagnosis not present

## 2014-12-07 DIAGNOSIS — M4692 Unspecified inflammatory spondylopathy, cervical region: Secondary | ICD-10-CM

## 2014-12-07 DIAGNOSIS — M7541 Impingement syndrome of right shoulder: Secondary | ICD-10-CM | POA: Diagnosis not present

## 2014-12-07 DIAGNOSIS — M25511 Pain in right shoulder: Secondary | ICD-10-CM | POA: Diagnosis not present

## 2014-12-07 DIAGNOSIS — M47812 Spondylosis without myelopathy or radiculopathy, cervical region: Secondary | ICD-10-CM

## 2014-12-07 MED ORDER — CYCLOBENZAPRINE HCL 10 MG PO TABS: 10.0000 mg | ORAL_TABLET | Freq: Three times a day (TID) | ORAL | Status: DC | PRN

## 2014-12-07 MED ORDER — MELOXICAM 15 MG PO TABS: 15.0000 mg | ORAL_TABLET | Freq: Every day | ORAL | Status: DC

## 2014-12-07 MED ORDER — HYDROCODONE-ACETAMINOPHEN 5-325 MG PO TABS: 1.0000 | ORAL_TABLET | Freq: Four times a day (QID) | ORAL | Status: DC | PRN

## 2014-12-07 NOTE — Patient Instructions (Signed)

## 2014-12-07 NOTE — Progress Notes (Signed)
Subjective:    Patient ID: Randy Wilson, male    DOB: 04-27-1966, 49 y.o.   MRN: [id number]  HPI Patient presents for shoulder and neck pain.  Shoulder pain has been present for past 6 months and denies trauma, fall, overuse, or increased exercise. Pain is constant and 7/10. Denies loss of function/sensation, but endorses loss in ROM secondary to pain. Denies numbness/tingling of hands. Works as a Copywriter, advertising. Worse with activity.  Neck pain has been present for past month and denies trauma. Pain is constant and 9/10. Denies loss of function/sensation/ROM, numbness, tingling, or weakness. Feels better with stretching. Denies back pain.   Is right hand dominant.  NKDA.  Review of Systems  Musculoskeletal: Positive for myalgias, arthralgias, neck pain and neck stiffness. Negative for back pain and joint swelling.  Neurological: Positive for weakness (right shoulder). Negative for numbness and headaches.       Objective:   Physical Exam  Constitutional: He is oriented to person, place, and time. He appears well-developed and well-nourished. No distress.  Blood pressure 118/76, pulse 62, temperature 97.7 F (36.5 C), temperature source Oral, resp. rate 18, height 6' 2.5" (1.892 m), weight 233 lb 9.6 oz (105.96 kg), SpO2 98 %.  HENT:  Head: Normocephalic and atraumatic.  Right Ear: External ear normal.  Left Ear: External ear normal.  Eyes: Conjunctivae and EOM are normal. Pupils are equal, round, and reactive to light. Right eye exhibits no discharge. Left eye exhibits no discharge. No scleral icterus.  Neck: Normal range of motion. Neck supple. No thyromegaly present.  Pulmonary/Chest: Effort normal.  Musculoskeletal: Normal range of motion. He exhibits no edema or tenderness.       Right shoulder: He exhibits pain. He exhibits normal range of motion, no tenderness, no bony tenderness, no swelling, no effusion, no deformity, no laceration, no spasm, normal pulse  and normal strength.       Left shoulder: Normal.       Cervical back: He exhibits pain. He exhibits normal range of motion, no tenderness, no bony tenderness, no swelling, no edema, no deformity, no laceration, no spasm and normal pulse.  Positive Hawkins test. Negative O'brien, Speed's, crossover, Drop arm, and Yergasin tests.  Lymphadenopathy:    He has no cervical adenopathy.  Neurological: He is alert and oriented to person, place, and time. He has normal strength and normal reflexes. He displays no atrophy. No cranial nerve deficit. He exhibits normal muscle tone. Coordination normal.  Skin: Skin is warm and dry. No rash noted. He is not diaphoretic. No erythema. No pallor.  Psychiatric: He has a normal mood and affect. His behavior is normal. Judgment and thought content normal.   [facility] reading (PRIMARY) by  Dr. Brigitte Pulse. Degenerative changes at [facility] joint and between C3 and C4.    Assessment & Plan:  1. Shoulder impingement syndrome, right 2. Right shoulder pain Shoulder exercises given. - DG Shoulder Right; Future - cyclobenzaprine (FLEXERIL) 10 MG tablet; Take 1 tablet (10 mg total) by mouth 3 (three) times daily as needed for muscle spasms.  Dispense: 30 tablet; Refill: 0 - meloxicam (MOBIC) 15 MG tablet; Take 1 tablet (15 mg total) by mouth daily.  Dispense: 30 tablet; Refill: 1 - HYDROcodone-acetaminophen (NORCO) 5-325 MG per tablet; Take 1 tablet by mouth every 6 (six) hours as needed.  Dispense: 30 tablet; Refill: 0  3. Arthritis of neck 4. Cervical pain (neck) - DG Cervical Spine Complete; Future - cyclobenzaprine (FLEXERIL) 10  MG tablet; Take 1 tablet (10 mg total) by mouth 3 (three) times daily as needed for muscle spasms.  Dispense: 30 tablet; Refill: 0 - meloxicam (MOBIC) 15 MG tablet; Take 1 tablet (15 mg total) by mouth daily.  Dispense: 30 tablet; Refill: 1 - HYDROcodone-acetaminophen (NORCO) 5-325 MG per tablet; Take 1 tablet by mouth every 6 (six) hours as needed.   Dispense: 30 tablet; Refill: 0   Shakim Faith PA-C  [facility] 12/07/2014 1:53 PM

## 2015-01-11 ENCOUNTER — Other Ambulatory Visit: Payer: Self-pay | Admitting: Surgery

## 2015-01-11 DIAGNOSIS — R1032 Left lower quadrant pain: Secondary | ICD-10-CM

## 2015-01-14 ENCOUNTER — Other Ambulatory Visit: Payer: 59

## 2015-01-19 ENCOUNTER — Inpatient Hospital Stay: Admission: RE | Admit: 2015-01-19 | Payer: 59 | Source: Ambulatory Visit

## 2015-02-14 ENCOUNTER — Ambulatory Visit
Admission: RE | Admit: 2015-02-14 | Discharge: 2015-02-14 | Disposition: A | Discharge status: Home / Self Care | Payer: 59 | Source: Ambulatory Visit | Attending: Surgery | Admitting: Surgery

## 2015-02-14 DIAGNOSIS — R1032 Left lower quadrant pain: Secondary | ICD-10-CM

## 2015-02-14 MED ORDER — IOPAMIDOL (ISOVUE-300) INJECTION 61%: 125.0000 mL | Freq: Once | INTRAVENOUS | Status: DC | PRN

## 2015-02-18 ENCOUNTER — Telehealth: Payer: Self-pay | Admitting: Gastroenterology

## 2015-02-18 NOTE — Telephone Encounter (Signed)
Will route to dr Deatra Ina.Marland KitchenMarland Kitchen

## 2015-02-22 NOTE — Telephone Encounter (Signed)
Forwarding to [employer].Marland KitchenMarland Kitchen

## 2015-02-22 NOTE — Telephone Encounter (Signed)
Rod Mae at [facility]

## 2015-02-25 ENCOUNTER — Other Ambulatory Visit: Payer: Self-pay

## 2015-02-25 NOTE — Telephone Encounter (Signed)
Patient notified of referral to Dr Harlon Ditty at [facility]. Appointment 03/18/15 at 11:00 am. Records and CT report faxed to [phone]. Office number is [phone]. Patient will pick up the disc with his CT before the appointment and take it with him.

## 2015-04-24 ENCOUNTER — Ambulatory Visit (INDEPENDENT_AMBULATORY_CARE_PROVIDER_SITE_OTHER): Payer: 59 | Admitting: Emergency Medicine

## 2015-04-24 VITALS — BP 122/78 | HR 70 | Temp 98.4°F | Resp 16 | Ht 74.0 in | Wt 243.0 lb

## 2015-04-24 DIAGNOSIS — J45901 Unspecified asthma with (acute) exacerbation: Secondary | ICD-10-CM | POA: Insufficient documentation

## 2015-04-24 DIAGNOSIS — J209 Acute bronchitis, unspecified: Secondary | ICD-10-CM | POA: Diagnosis not present

## 2015-04-24 DIAGNOSIS — J4531 Mild persistent asthma with (acute) exacerbation: Secondary | ICD-10-CM

## 2015-04-24 DIAGNOSIS — J0140 Acute pansinusitis, unspecified: Secondary | ICD-10-CM

## 2015-04-24 MED ORDER — AMOXICILLIN-POT CLAVULANATE 875-125 MG PO TABS: 1.0000 | ORAL_TABLET | Freq: Two times a day (BID) | ORAL | Status: DC

## 2015-04-24 MED ORDER — PSEUDOEPHEDRINE-GUAIFENESIN ER 60-600 MG PO TB12: 1.0000 | ORAL_TABLET | Freq: Two times a day (BID) | ORAL | Status: DC

## 2015-04-24 MED ORDER — ALBUTEROL SULFATE HFA 108 (90 BASE) MCG/ACT IN AERS: 2.0000 | INHALATION_SPRAY | RESPIRATORY_TRACT | Status: DC | PRN

## 2015-04-24 MED ORDER — TRIAMCINOLONE ACETONIDE 0.1 % EX CREA: 1.0000 "application " | TOPICAL_CREAM | Freq: Two times a day (BID) | CUTANEOUS | Status: DC

## 2015-04-24 MED ORDER — HYDROCOD POLST-CPM POLST ER 10-8 MG/5ML PO SUER: 5.0000 mL | Freq: Two times a day (BID) | ORAL | Status: DC

## 2015-04-24 NOTE — Addendum Note (Signed)
Addended by: Kem Boroughs D on: 04/24/2015 03:09 PM   Modules accepted: Orders

## 2015-04-24 NOTE — Patient Instructions (Signed)

## 2015-04-24 NOTE — Progress Notes (Signed)
Subjective:  Patient ID: Randy Wilson, male    DOB: [birth year]  Age: 49 y.o. MRN: [id number]  CC: Sore Throat; Shortness of Breath; and Cough   HPI Randy Wilson presents  patient has a several day history of nasal congestion postnasal drainage is purulent in color. He has pressure in his cheeks and forehead. He has a cough productive of mucopurulent sputum. Has no wheezing but has some shortness of breath with exertion.. No fever or chills. No nausea vomiting or stool change. No rash. No improvement with over the counter medication.  History Randy Wilson has a past medical history of Shoulder pain; Hemorrhoids; Rectal bleeding; and Allergy.   He has past surgical history that includes Shoulder surgery (2010) and Ankle surgery (2002).   His  family history includes Asthma (age of onset: 2) in his mother. There is no history of Colon cancer, Stomach cancer, Rectal cancer, or Esophageal cancer.  He   reports that he quit smoking about 9 years ago. He has quit using smokeless tobacco. He reports that he drinks about 3.6 oz of alcohol per week. He reports that he does not use illicit drugs.  Outpatient Prescriptions Prior to Visit  Medication Sig Dispense Refill  . cetirizine (ZYRTEC) 10 MG tablet Take 10 mg by mouth daily.    Marland Kitchen acetaminophen-codeine (TYLENOL #3) 300-30 MG per tablet Take 1-2 tablets by mouth every 4 (four) hours as needed. (Patient not taking: Reported on 04/24/2015) 30 tablet 0  . amoxicillin-clavulanate (AUGMENTIN) 875-125 MG per tablet Take 1 tablet by mouth 2 (two) times daily. 20 tablet 0  . cyclobenzaprine (FLEXERIL) 10 MG tablet Take 1 tablet (10 mg total) by mouth 3 (three) times daily as needed for muscle spasms. 30 tablet 0  . HYDROcodone-acetaminophen (NORCO) 5-325 MG per tablet Take 1 tablet by mouth every 6 (six) hours as needed. 30 tablet 0  . meloxicam (MOBIC) 15 MG tablet Take 1 tablet (15 mg total) by mouth daily. 30 tablet 1  . Naproxen Sodium (ALEVE PO) Take  by mouth as needed.    . pseudoephedrine-guaifenesin (MUCINEX D) 60-600 MG per tablet Take 1 tablet by mouth every 12 (twelve) hours. (Patient not taking: Reported on 10/05/2014) 18 tablet 0   No facility-administered medications prior to visit.    Social History   Social History  . Marital Status: Single    Spouse Name: N/A  . Number of Children: N/A  . Years of Education: N/A   Social History Main Topics  . Smoking status: Former Smoker    Quit date: 01/21/2006  . Smokeless tobacco: Former Systems developer  . Alcohol Use: 3.6 oz/week    6 Cans of beer per week  . Drug Use: No  . Sexual Activity: Not Asked   Other Topics Concern  . None   Social History Narrative     Review of Systems  Constitutional: Positive for fatigue. Negative for fever, chills and appetite change.  HENT: Positive for congestion, postnasal drip, rhinorrhea, sinus pressure and sore throat. Negative for ear pain.   Eyes: Negative for pain and redness.  Respiratory: Positive for cough and wheezing. Negative for shortness of breath.   Cardiovascular: Negative for leg swelling.  Gastrointestinal: Negative for nausea, vomiting, abdominal pain, diarrhea, constipation and blood in stool.  Endocrine: Negative for polyuria.  Genitourinary: Negative for dysuria, urgency, frequency and flank pain.  Musculoskeletal: Negative for gait problem.  Skin: Negative for rash.  Neurological: Negative for weakness and headaches.  Psychiatric/Behavioral: Negative for  confusion and decreased concentration. The patient is not nervous/anxious.     Objective:  BP 122/78 mmHg  Pulse 70  Temp(Src) 98.4 F (36.9 C)  Resp 16  Ht 6' 2"  (1.88 m)  Wt 243 lb (110.224 kg)  BMI 31.19 kg/m2  SpO2 98%  Physical Exam  Constitutional: He is oriented to person, place, and time. He appears well-developed and well-nourished. No distress.  HENT:  Head: Normocephalic and atraumatic.  Right Ear: External ear normal.  Left Ear: External ear  normal.  Nose: Nose normal.  Eyes: Conjunctivae and EOM are normal. Pupils are equal, round, and reactive to light. No scleral icterus.  Neck: Normal range of motion. Neck supple. No tracheal deviation present.  Cardiovascular: Normal rate, regular rhythm and normal heart sounds.   Pulmonary/Chest: Effort normal. No respiratory distress. He has no wheezes. He has no rales.  Abdominal: He exhibits no mass. There is no tenderness. There is no rebound and no guarding.  Musculoskeletal: He exhibits no edema.  Lymphadenopathy:    He has no cervical adenopathy.  Neurological: He is alert and oriented to person, place, and time. Coordination normal.  Skin: Skin is warm and dry. No rash noted.  Psychiatric: He has a normal mood and affect. His behavior is normal.      Assessment & Plan:   Teresa was seen today for sore throat, shortness of breath and cough.  Diagnoses and all orders for this visit:  Asthma with acute exacerbation, mild persistent  Acute pansinusitis, recurrence not specified  Acute bronchitis, unspecified organism  Other orders -     Discontinue: amoxicillin-clavulanate (AUGMENTIN) 875-125 MG tablet; Take 1 tablet by mouth 2 (two) times daily. -     Discontinue: pseudoephedrine-guaifenesin (MUCINEX D) 60-600 MG 12 hr tablet; Take 1 tablet by mouth every 12 (twelve) hours. -     chlorpheniramine-HYDROcodone (TUSSIONEX PENNKINETIC ER) 10-8 MG/5ML SUER; Take 5 mLs by mouth 2 (two) times daily. -     Discontinue: albuterol (PROVENTIL HFA;VENTOLIN HFA) 108 (90 BASE) MCG/ACT inhaler; Inhale 2 puffs into the lungs every 4 (four) hours as needed for wheezing or shortness of breath (cough, shortness of breath or wheezing.). -     Discontinue: triamcinolone cream (KENALOG) 0.1 %; Apply 1 application topically 2 (two) times daily. -     albuterol (PROVENTIL HFA;VENTOLIN HFA) 108 (90 BASE) MCG/ACT inhaler; Inhale 2 puffs into the lungs every 4 (four) hours as needed for wheezing or  shortness of breath (cough, shortness of breath or wheezing.). -     amoxicillin-clavulanate (AUGMENTIN) 875-125 MG tablet; Take 1 tablet by mouth 2 (two) times daily. -     pseudoephedrine-guaifenesin (MUCINEX D) 60-600 MG 12 hr tablet; Take 1 tablet by mouth every 12 (twelve) hours. -     triamcinolone cream (KENALOG) 0.1 %; Apply 1 application topically 2 (two) times daily.   I have discontinued Mr. Crays Naproxen Sodium (ALEVE PO), acetaminophen-codeine, cyclobenzaprine, meloxicam, and HYDROcodone-acetaminophen. I am also having him start on chlorpheniramine-HYDROcodone. Additionally, I am having him maintain his cetirizine, albuterol, amoxicillin-clavulanate, pseudoephedrine-guaifenesin, and triamcinolone cream.  Meds ordered this encounter  Medications  . DISCONTD: amoxicillin-clavulanate (AUGMENTIN) 875-125 MG tablet    Sig: Take 1 tablet by mouth 2 (two) times daily.    Dispense:  20 tablet    Refill:  0  . DISCONTD: pseudoephedrine-guaifenesin (MUCINEX D) 60-600 MG 12 hr tablet    Sig: Take 1 tablet by mouth every 12 (twelve) hours.    Dispense:  18 tablet  Refill:  0  . chlorpheniramine-HYDROcodone (TUSSIONEX PENNKINETIC ER) 10-8 MG/5ML SUER    Sig: Take 5 mLs by mouth 2 (two) times daily.    Dispense:  60 mL    Refill:  0  . DISCONTD: albuterol (PROVENTIL HFA;VENTOLIN HFA) 108 (90 BASE) MCG/ACT inhaler    Sig: Inhale 2 puffs into the lungs every 4 (four) hours as needed for wheezing or shortness of breath (cough, shortness of breath or wheezing.).    Dispense:  1 Inhaler    Refill:  1  . DISCONTD: triamcinolone cream (KENALOG) 0.1 %    Sig: Apply 1 application topically 2 (two) times daily.    Dispense:  30 g    Refill:  0  . albuterol (PROVENTIL HFA;VENTOLIN HFA) 108 (90 BASE) MCG/ACT inhaler    Sig: Inhale 2 puffs into the lungs every 4 (four) hours as needed for wheezing or shortness of breath (cough, shortness of breath or wheezing.).    Dispense:  1 Inhaler     Refill:  1  . amoxicillin-clavulanate (AUGMENTIN) 875-125 MG tablet    Sig: Take 1 tablet by mouth 2 (two) times daily.    Dispense:  20 tablet    Refill:  0  . pseudoephedrine-guaifenesin (MUCINEX D) 60-600 MG 12 hr tablet    Sig: Take 1 tablet by mouth every 12 (twelve) hours.    Dispense:  18 tablet    Refill:  0  . triamcinolone cream (KENALOG) 0.1 %    Sig: Apply 1 application topically 2 (two) times daily.    Dispense:  30 g    Refill:  0    Appropriate red flag conditions were discussed with the patient as well as actions that should be taken.  Patient expressed his understanding.  Follow-up: Return if symptoms worsen or fail to improve.  Roselee Culver, MD

## 2015-05-29 DIAGNOSIS — K5000 Crohn's disease of small intestine without complications: Secondary | ICD-10-CM

## 2015-05-29 HISTORY — DX: Crohn's disease of small intestine without complications: K50.00

## 2015-06-02 ENCOUNTER — Ambulatory Visit (INDEPENDENT_AMBULATORY_CARE_PROVIDER_SITE_OTHER): Payer: BLUE CROSS/BLUE SHIELD | Admitting: Emergency Medicine

## 2015-06-02 VITALS — BP 126/80 | HR 76 | Temp 98.0°F | Resp 17 | Ht 73.5 in | Wt 235.0 lb

## 2015-06-02 DIAGNOSIS — L500 Allergic urticaria: Secondary | ICD-10-CM

## 2015-06-02 DIAGNOSIS — M5431 Sciatica, right side: Secondary | ICD-10-CM

## 2015-06-02 DIAGNOSIS — Z23 Encounter for immunization: Secondary | ICD-10-CM | POA: Diagnosis not present

## 2015-06-02 MED ORDER — NAPROXEN SODIUM 550 MG PO TABS: 550.0000 mg | ORAL_TABLET | Freq: Two times a day (BID) | ORAL | Status: DC

## 2015-06-02 MED ORDER — TRAMADOL HCL 50 MG PO TABS: 50.0000 mg | ORAL_TABLET | Freq: Four times a day (QID) | ORAL | Status: DC | PRN

## 2015-06-02 MED ORDER — HYDROXYZINE HCL 25 MG PO TABS: 25.0000 mg | ORAL_TABLET | Freq: Three times a day (TID) | ORAL | Status: DC | PRN

## 2015-06-02 NOTE — Patient Instructions (Signed)

## 2015-06-02 NOTE — Progress Notes (Signed)
Subjective:  Patient ID: Randy Wilson, male    DOB: [birth year]  Age: 50 y.o. MRN: [id number]  CC: Leg Pain; Rash; and Immunizations   HPI Randy Wilson presents  patient has a three-month history of pain in his calf. He said is worse when he sits or drives. He denies any back pain or history of injury. Has some tingling in his leg as well posteriorly. He has no weakness in his leg. No foot drop.  He's also developed a generalized skin rash that's pruritic. And he has dermatographia some when he scratches. He has recently changed his shampoo. And the rash began 2 days after he stopped using his previous product he has no antecedent illness or change and other products.  History Randy Wilson has a past medical history of Shoulder pain; Hemorrhoids; Rectal bleeding; and Allergy.   He has past surgical history that includes Shoulder surgery (2010) and Ankle surgery (2002).   His  family history includes Asthma (age of onset: 67) in his mother. There is no history of Colon cancer, Stomach cancer, Rectal cancer, or Esophageal cancer.  He   reports that he quit smoking about 9 years ago. He has quit using smokeless tobacco. He reports that he drinks about 3.6 oz of alcohol per week. He reports that he does not use illicit drugs.  Outpatient Prescriptions Prior to Visit  Medication Sig Dispense Refill  . albuterol (PROVENTIL HFA;VENTOLIN HFA) 108 (90 BASE) MCG/ACT inhaler Inhale 2 puffs into the lungs every 4 (four) hours as needed for wheezing or shortness of breath (cough, shortness of breath or wheezing.). 1 Inhaler 1  . cetirizine (ZYRTEC) 10 MG tablet Take 10 mg by mouth daily.    Marland Kitchen triamcinolone cream (KENALOG) 0.1 % Apply 1 application topically 2 (two) times daily. 30 g 0  . amoxicillin-clavulanate (AUGMENTIN) 875-125 MG tablet Take 1 tablet by mouth 2 (two) times daily. (Patient not taking: Reported on 06/02/2015) 20 tablet 0  . chlorpheniramine-HYDROcodone (TUSSIONEX PENNKINETIC ER) 10-8  MG/5ML SUER Take 5 mLs by mouth 2 (two) times daily. 60 mL 0  . pseudoephedrine-guaifenesin (MUCINEX D) 60-600 MG 12 hr tablet Take 1 tablet by mouth every 12 (twelve) hours. 18 tablet 0   No facility-administered medications prior to visit.    Social History   Social History  . Marital Status: Single    Spouse Name: N/A  . Number of Children: N/A  . Years of Education: N/A   Social History Main Topics  . Smoking status: Former Smoker    Quit date: 01/21/2006  . Smokeless tobacco: Former Systems developer  . Alcohol Use: 3.6 oz/week    6 Cans of beer per week  . Drug Use: No  . Sexual Activity: Not Asked   Other Topics Concern  . None   Social History Narrative     Review of Systems  Constitutional: Negative for fever, chills and appetite change.  HENT: Negative for congestion, ear pain, postnasal drip, sinus pressure and sore throat.   Eyes: Negative for pain and redness.  Respiratory: Negative for cough, shortness of breath and wheezing.   Cardiovascular: Negative for leg swelling.  Gastrointestinal: Negative for nausea, vomiting, abdominal pain, diarrhea, constipation and blood in stool.  Endocrine: Negative for polyuria.  Genitourinary: Negative for dysuria, urgency, frequency and flank pain.  Musculoskeletal: Negative for gait problem.  Skin: Positive for rash.  Neurological: Positive for numbness. Negative for weakness and headaches.  Psychiatric/Behavioral: Negative for confusion and decreased concentration. The patient is  not nervous/anxious.     Objective:  BP 126/80 mmHg  Pulse 76  Temp(Src) 98 F (36.7 C) (Oral)  Resp 17  Ht 6' 1.5" (1.867 m)  Wt 235 lb (106.595 kg)  BMI 30.58 kg/m2  SpO2 97%  Physical Exam  Constitutional: He is oriented to person, place, and time. He appears well-developed and well-nourished. No distress.  HENT:  Head: Normocephalic and atraumatic.  Right Ear: External ear normal.  Left Ear: External ear normal.  Nose: Nose normal.    Eyes: Conjunctivae and EOM are normal. Pupils are equal, round, and reactive to light. No scleral icterus.  Neck: Normal range of motion. Neck supple. No tracheal deviation present.  Cardiovascular: Normal rate, regular rhythm and normal heart sounds.   Pulmonary/Chest: Effort normal. No respiratory distress. He has no wheezes. He has no rales.  Abdominal: He exhibits no mass. There is no tenderness. There is no rebound and no guarding.  Musculoskeletal: He exhibits no edema.  Lymphadenopathy:    He has no cervical adenopathy.  Neurological: He is alert and oriented to person, place, and time. He exhibits abnormal muscle tone (right knee extension). Coordination normal. He displays no Babinski's sign on the right side. He displays no Babinski's sign on the left side.  Reflex Scores:      Patellar reflexes are 1+ on the right side and 2+ on the left side. Skin: Skin is warm and dry. No rash noted.  Psychiatric: He has a normal mood and affect. His behavior is normal.      Assessment & Plan:   Randy Wilson was seen today for leg pain, rash and immunizations.  Diagnoses and all orders for this visit:  Need for prophylactic vaccination and inoculation against influenza -     Flu Vaccine QUAD 36+ mos IM   I have discontinued Randy Wilson pseudoephedrine-guaifenesin and chlorpheniramine-HYDROcodone. I am also having him maintain his cetirizine, albuterol, amoxicillin-clavulanate, and triamcinolone cream.  No orders of the defined types were placed in this encounter.   I told we try him on some medication for his leg pain but it looks to me like a sciatic neuritis if the medication doesn't resolve his symptoms will come up with an MRI.   Appropriate red flag conditions were discussed with the patient as well as actions that should be taken.  Patient expressed his understanding.  Follow-up: No Follow-up on file.  Roselee Culver, MD

## 2015-07-26 ENCOUNTER — Ambulatory Visit (INDEPENDENT_AMBULATORY_CARE_PROVIDER_SITE_OTHER): Payer: BLUE CROSS/BLUE SHIELD | Admitting: Gastroenterology

## 2015-07-26 ENCOUNTER — Encounter: Payer: Self-pay | Admitting: Gastroenterology

## 2015-07-26 VITALS — BP 126/80 | HR 68 | Ht 73.5 in | Wt 243.6 lb

## 2015-07-26 DIAGNOSIS — K602 Anal fissure, unspecified: Secondary | ICD-10-CM | POA: Diagnosis not present

## 2015-07-26 DIAGNOSIS — K648 Other hemorrhoids: Secondary | ICD-10-CM

## 2015-07-26 DIAGNOSIS — K6289 Other specified diseases of anus and rectum: Secondary | ICD-10-CM

## 2015-07-26 MED ORDER — DILTIAZEM GEL 2 %: 1.0000 "application " | Freq: Two times a day (BID) | CUTANEOUS | Status: DC

## 2015-07-26 MED ORDER — NITROGLYCERIN 0.4 % RE OINT: 1.0000 "application " | TOPICAL_OINTMENT | Freq: Two times a day (BID) | RECTAL | Status: DC

## 2015-07-26 NOTE — Patient Instructions (Addendum)
Use diltiazem AND rectiv (lidocaine) ointment - a pea-sized amount of each, either on a gloved finger or applied to a glycerine suppository, twice daily for 6 weeks.  Then keep appointment with Dr Hilarie Fredrickson to reassess the fissure and do hemorrhoidal banding.

## 2015-07-26 NOTE — Progress Notes (Signed)
 GI Progress Note  Chief Complaint: rectal bleeding  Subjective History:   Last seen Randy Wilson 08/2014 - large ICV polyp on screening colon, not adenomatous (inflmatory pseudopolyp), but tumor conference rec resection due to risk of bleed/obstruction.  Randy Wilson did not take his insurance at the time, so he saw Randy Wilson at [facility], did colonoscopy 10/2014 - removed ?some of the polyp (report unclear in [place]).  Did another colonoscopy in Sept 2016, more Bx and then a CT scan?.  Patient frustrated and left his care. Now here for frequently bleeding and painful hemorrhoids x 2 months (worse than in past) Also had large int roids on 08/2014 colonoscopy. Has pain with BM and bleeding nearly every time.  ROS: Cardiovascular:  no chest pain Respiratory: no dyspnea  Past Medical History: Past Medical History  Diagnosis Date  . Shoulder pain   . Hemorrhoids   . Rectal bleeding   . Allergy     Objective:  Med list reviewed  Vital signs in last 24 hrs: Filed Vitals:   07/26/15 0928  BP: 126/80  Pulse: 68    Physical Exam   HEENT: sclera anicteric, oral mucosa moist without lesions  Neck: supple, no thyromegaly, JVD or lymphadenopathy  Cardiac: RRR without murmurs, S1S2 heard, no peripheral edema  Pulm: clear to auscultation bilaterally, normal RR and effort noted  Abdomen: soft, no tenderness, with active bowel sounds. No guarding or palpable hepatosplenomegaly  Skin; warm and dry, no jaundice or rash  Rectal/anosopy (performed by Randy Wilson, who came in for opinion): tender posterior, definitely right anterior HR, blood on scope.  Could not evaluate right posterior or left HR well due to patient discomfort.  Outside records reviewed as above.   @ASSESSMENTPLANBEGIN @ Assessment: Encounter Diagnoses  Name Primary?  . Internal hemorrhoid, bleeding Yes  . Rectal pain   . Anal fissure   non-adenomatous colon polyp (inflammatory  pseudopolyp)  Fissure likely cause of most of the pain now, HR are bleeding source. Must heal fissure before banding  Plan:  Diltiazem and rectiv ointment twice daily for 4-6 weeks Then see Randy Wilson for recheck and possible first banding. After all banding, needs repeat colonoscopy to assess ICV polyp and make management plan.  Over half of the 40 minute encounter was spent in counseling and coordination of care.   Topics discussed: hemorrhoid management (visual aids used), also fissure treatment.  Plan for later colonoscopy.   Randy Wilson

## 2015-07-27 ENCOUNTER — Telehealth: Payer: Self-pay

## 2015-07-27 MED ORDER — LIDOCAINE 5 % EX OINT: 1.0000 "application " | TOPICAL_OINTMENT | Freq: Two times a day (BID) | CUTANEOUS | Status: DC

## 2015-07-27 NOTE — Telephone Encounter (Signed)
NTG ointment is not the same as rectiv Perhaps they will give generic xylocaine 5% (or 2.5% if that is all they have) ointment.  If not, then just use the diltiazem gel

## 2015-07-27 NOTE — Telephone Encounter (Signed)
Xylocaine 5% ointment sent to [facility].

## 2015-07-27 NOTE — Telephone Encounter (Signed)
[facility] sent a fax stating Rectiv 0.4 % ointment was not covered with insurance. Alternative is NTG 0.4% ointment. Is it okay to change. Please advise.

## 2015-08-15 ENCOUNTER — Telehealth: Payer: Self-pay | Admitting: Gastroenterology

## 2015-08-15 NOTE — Telephone Encounter (Signed)
Pt has left sided abd pain for the past 3 weeks,  He saw  Dr Samuel Germany at [facility], did colonoscopy 10/2014 - removed ?some of the polyp (report unclear in [place]). Did another colonoscopy in Sept 2016, more Bx and then a CT scan?. Patient frustrated and left his care.  He now would like to see Dr Loletha Carrow again to reassess his left sided abd pain.  He does have an appt with Dr Hilarie Fredrickson for 08/29/15 for hemorrhoid banding.  I scheduled an appt for 09/16/15 with Dr Loletha Carrow to discuss the abd pain.  He will call back if the pain worsens or other symptoms develop.

## 2015-08-16 ENCOUNTER — Encounter: Payer: Self-pay | Admitting: *Deleted

## 2015-08-29 ENCOUNTER — Encounter: Payer: Self-pay | Admitting: Internal Medicine

## 2015-08-29 ENCOUNTER — Ambulatory Visit (INDEPENDENT_AMBULATORY_CARE_PROVIDER_SITE_OTHER): Payer: Self-pay | Admitting: Internal Medicine

## 2015-08-29 VITALS — BP 110/70 | HR 63 | Ht 72.75 in | Wt 240.8 lb

## 2015-08-29 DIAGNOSIS — K602 Anal fissure, unspecified: Secondary | ICD-10-CM

## 2015-08-29 DIAGNOSIS — Z86010 Personal history of colonic polyps: Secondary | ICD-10-CM

## 2015-08-29 MED ORDER — NA SULFATE-K SULFATE-MG SULF 17.5-3.13-1.6 GM/177ML PO SOLN: ORAL | Status: DC

## 2015-08-29 NOTE — Progress Notes (Signed)
Patient ID: Randy Wilson, male   DOB: Dec 03, 1965, 49 y.o.   MRN: [id number] Randy Wilson is a 50 year old male with a history of large IC valve polyp, anal fissure and internal hemorrhoids who presents for possible banding. Patient seen with Dr. Loletha Carrow at the time of office visit on 07/26/2015. At that visit he was having frequent bleeding and painful bowel movements over 2 month. We performed and a rectal exam and attempted endoscopy. This revealed internal hemorrhoids but also fissure felt is a subtle very tender pile. Primary symptoms causing symptoms at that time felt to be fissure. He was treated with diltiazem gel twice a day and returns for follow-up and consideration of banding All symptoms have resolved and he's had no further and rectal pain with defecation and no further bleeding. Time spent today discussing internal hemorrhoids and fissure disease. He definitely has internal hemorrhoids documented at the time of colonoscopy in April 2016 but currently they are asymptomatic. He has modified his diet and added fiber which has helped him avoid large hard stool. After our discussion he prefers to delay/defer hemorrhoidal banding given resolution of symptoms which were primarily fissure Dr. Loletha Carrow does wish to proceed with repeat colonoscopy to assess his previously seen IC valve polyp. We discussed the risks, benefits and alternatives to repeat colonoscopy, he is agreeable to proceed and this will be scheduled with Dr. Loletha Carrow.  15 minutes spent with the patient today. Greater than 50% was spent in counseling and coordination of care with the patient

## 2015-08-29 NOTE — Patient Instructions (Signed)
You have been scheduled for a colonoscopy with Dr Wilfrid Lund. Please follow written instructions given to you at your visit today.  Please pick up your prep supplies at the pharmacy within the next 1-3 days. If you use inhalers (even only as needed), please bring them with you on the day of your procedure. Your physician has requested that you go to www.startemmi.com and enter the access code given to you at your visit today. This web site gives a general overview about your procedure. However, you should still follow specific instructions given to you by our office regarding your preparation for the procedure.  If you are age 75 or older, your body mass index should be between 23-30. Your Body mass index is 31.98 kg/(m^2). If this is out of the aforementioned range listed, please consider follow up with your Primary Care Provider.  If you are age 82 or younger, your body mass index should be between 19-25. Your Body mass index is 31.98 kg/(m^2). If this is out of the aformentioned range listed, please consider follow up with your Primary Care Provider.

## 2015-09-06 ENCOUNTER — Telehealth: Payer: Self-pay | Admitting: Gastroenterology

## 2015-09-06 NOTE — Telephone Encounter (Signed)
Returned patient's call and he states that he ate a salad yesterday.  I advised him that we ask for patients not to eat those things 5 days prior and urged him not to eat anything today at all.  He states that he had a bag of chips an hour ago. He stated also that he lost his instructions and just now found them.  I advised him not to eat anything else today and to stay on clear liquids up until 5:30 am tomorrow morning, nothing after that.  He states that he would comply.  All questions were answered.

## 2015-09-07 ENCOUNTER — Encounter: Payer: Self-pay | Admitting: Gastroenterology

## 2015-09-07 ENCOUNTER — Ambulatory Visit (AMBULATORY_SURGERY_CENTER): Payer: BLUE CROSS/BLUE SHIELD | Admitting: Gastroenterology

## 2015-09-07 VITALS — BP 102/74 | HR 60 | Temp 97.1°F | Resp 12 | Ht 73.0 in | Wt 243.0 lb

## 2015-09-07 DIAGNOSIS — Z86010 Personal history of colonic polyps: Secondary | ICD-10-CM | POA: Diagnosis not present

## 2015-09-07 DIAGNOSIS — D120 Benign neoplasm of cecum: Secondary | ICD-10-CM

## 2015-09-07 DIAGNOSIS — K633 Ulcer of intestine: Secondary | ICD-10-CM | POA: Diagnosis not present

## 2015-09-07 MED ORDER — SODIUM CHLORIDE 0.9 % IV SOLN: 500.0000 mL | INTRAVENOUS | Status: DC

## 2015-09-07 NOTE — Patient Instructions (Signed)
Impression/recommendations:  Polyps lesion found at the ileocecal valve. Hemorrhoids (handout given)  YOU HAD AN ENDOSCOPIC PROCEDURE TODAY AT [facility]:   Refer to the procedure report that was given to you for any specific questions about what was found during the examination.  If the procedure report does not answer your questions, please call your gastroenterologist to clarify.  If you requested that your care partner not be given the details of your procedure findings, then the procedure report has been included in a sealed envelope for you to review at your convenience later.  YOU SHOULD EXPECT: Some feelings of bloating in the abdomen. Passage of more gas than usual.  Walking can help get rid of the air that was put into your GI tract during the procedure and reduce the bloating. If you had a lower endoscopy (such as a colonoscopy or flexible sigmoidoscopy) you may notice spotting of blood in your stool or on the toilet paper. If you underwent a bowel prep for your procedure, you may not have a normal bowel movement for a few days.  Please Note:  You might notice some irritation and congestion in your nose or some drainage.  This is from the oxygen used during your procedure.  There is no need for concern and it should clear up in a day or so.  SYMPTOMS TO REPORT IMMEDIATELY:   Following lower endoscopy (colonoscopy or flexible sigmoidoscopy):  Excessive amounts of blood in the stool  Significant tenderness or worsening of abdominal pains  Swelling of the abdomen that is new, acute  Fever of 100F or higher   For urgent or emergent issues, a gastroenterologist can be reached at any hour by calling [phone].   DIET: Your first meal following the procedure should be a small meal and then it is ok to progress to your normal diet. Heavy or fried foods are harder to digest and may make you feel nauseous or bloated.  Likewise, meals heavy in dairy and vegetables can  increase bloating.  Drink plenty of fluids but you should avoid alcoholic beverages for 24 hours.  ACTIVITY:  You should plan to take it easy for the rest of today and you should NOT DRIVE or use heavy machinery until tomorrow (because of the sedation medicines used during the test).    FOLLOW UP: Our staff will call the number listed on your records the next business day following your procedure to check on you and address any questions or concerns that you may have regarding the information given to you following your procedure. If we do not reach you, we will leave a message.  However, if you are feeling well and you are not experiencing any problems, there is no need to return our call.  We will assume that you have returned to your regular daily activities without incident.  If any biopsies were taken you will be contacted by phone or by letter within the next 1-3 weeks.  Please call us at [phone] if you have not heard about the biopsies in 3 weeks.    SIGNATURES/CONFIDENTIALITY: You and/or your care partner have signed paperwork which will be entered into your electronic medical record.  These signatures attest to the fact that that the information above on your After Visit Summary has been reviewed and is understood.  Full responsibility of the confidentiality of this discharge information lies with you and/or your care-partner.

## 2015-09-07 NOTE — Progress Notes (Signed)
A/ox3, pleased with MAC, report to RN 

## 2015-09-07 NOTE — Op Note (Signed)
[place] [place] Patient Name: Randy Wilson Procedure Date: 09/07/2015 8:21 AM MRN: [id number] Endoscopist: Nittany. Danis MD, MD Age: 50 Date of Birth: 1965-12-04 Gender: Male Procedure:                Colonoscopy Indications:              Follow up of an inflammatory polyp on the ICV Medicines:                Monitored Anesthesia Care Procedure:                Pre-Anesthesia Assessment:                           - Prior to the procedure, a History and Physical                            was performed, and patient medications and                            allergies were reviewed. The patient's tolerance of                            previous anesthesia was also reviewed. The risks                            and benefits of the procedure and the sedation                            options and risks were discussed with the patient.                            All questions were answered, and informed consent                            was obtained. Prior Anticoagulants: The patient has                            taken no previous anticoagulant or antiplatelet                            agents. ASA Grade Assessment: I - A normal, healthy                            patient. After reviewing the risks and benefits,                            the patient was deemed in satisfactory condition to                            undergo the procedure.                           After obtaining informed consent, the colonoscope  was passed under direct vision. Throughout the                            procedure, the patient's blood pressure, pulse, and                            oxygen saturations were monitored continuously. The                            Model CF-HQ190L [id number] scope was introduced                            through the anus and advanced to the the cecum,                            identified by appendiceal orifice and ileocecal        valve. The colonoscopy was performed without                            difficulty. The patient tolerated the procedure                            well. The quality of the bowel preparation was                            excellent. The ileocecal valve, appendiceal                            orifice, and rectum were photographed. Scope In: 8:29:03 AM Scope Out: 8:44:22 AM Scope Withdrawal Time: 0 hours 12 minutes 48 seconds  Total Procedure Duration: 0 hours 15 minutes 19 seconds  Findings:                 The perianal and digital rectal examinations were                            normal. Pertinent negatives include fissure has                            healed.                           A polypoid lesion was found at the ileocecal valve.                            The lesion was multi-lobulated. Inflamed and                            friable. It was approximately 2cm in greatest                            dimension, but irregular in shape and extending                            through the ICV  into the terminal ileum (as                            before). The scope could not be advanced into the                            TI, but the TI could be glimpsed with the scope                            positioned in the ICV. The polyp was biopsied with                            a cold forceps for histology to confirm that it                            remains an inflammatory polyp.                           The patient's most recent colonsocopy was done at                            an outside institution, and there are no photos                            from that for comparison. However, it is known that                            more tissue was removed from the polyp during that                            procedure.                           Internal hemorrhoids were found during                            retroflexion. The hemorrhoids were Grade I                             (internal hemorrhoids that do not prolapse).                           The exam was otherwise without abnormality. Complications:            No immediate complications. Estimated Blood Loss:     Estimated blood loss was minimal. Impression:               - Polypoid lesion at the ileocecal valve. Biopsied.                           - Internal hemorrhoids.                           - The examination was otherwise normal. Recommendation:           -  Patient has a contact number available for                            emergencies. The signs and symptoms of potential                            delayed complications were discussed with the                            patient. Return to normal activities tomorrow.                            Written discharge instructions were provided to the                            patient.                           - Resume previous diet.                           - Continue present medications.                           - Await pathology results.                           - Repeat colonoscopy is recommended for                            surveillance. The colonoscopy date will be                            determined after pathology results from today's                            exam become available for review.                           - Reconsider surgical resection due to ongoing risk                            of bleeding or obstruction from the polyp, as it                            cannot be removed endoscopically. [name] L. Danis MD, MD 09/07/2015 8:57:02 AM This report has been signed electronically.

## 2015-09-07 NOTE — Progress Notes (Signed)
Called to room to assist during endoscopic procedure.  Patient ID and intended procedure confirmed with present staff. Received instructions for my participation in the procedure from the performing physician.  

## 2015-09-08 ENCOUNTER — Telehealth: Payer: Self-pay | Admitting: *Deleted

## 2015-09-08 NOTE — Telephone Encounter (Signed)
  Follow up Call-  Call back number 09/07/2015 09/07/2014  Post procedure Call Back phone  # [phone]  Permission to leave phone message Yes Yes     Patient questions:  Do you have a fever, pain , or abdominal swelling? No. Pain Score  0 *  Have you tolerated food without any problems? Yes.    Have you been able to return to your normal activities? Yes.    Do you have any questions about your discharge instructions: Diet   No. Medications  No. Follow up visit  No.  Do you have questions or concerns about your Care? No.  Actions: * If pain score is 4 or above: No action needed, pain <4.

## 2015-09-15 ENCOUNTER — Encounter: Payer: Self-pay | Admitting: Gastroenterology

## 2015-09-16 ENCOUNTER — Encounter: Payer: Self-pay | Admitting: Gastroenterology

## 2015-09-16 ENCOUNTER — Ambulatory Visit (INDEPENDENT_AMBULATORY_CARE_PROVIDER_SITE_OTHER): Payer: BLUE CROSS/BLUE SHIELD | Admitting: Gastroenterology

## 2015-09-16 VITALS — BP 110/70 | HR 72 | Ht 72.75 in | Wt 235.4 lb

## 2015-09-16 DIAGNOSIS — R1032 Left lower quadrant pain: Secondary | ICD-10-CM

## 2015-09-16 DIAGNOSIS — K635 Polyp of colon: Secondary | ICD-10-CM

## 2015-09-16 NOTE — Patient Instructions (Signed)
We have you booked for a visit to see Dr Leighton Ruff at [facility] 12/26/13 230 pm arrival time.  Thank you for choosing  GI  Dr Wilfrid Lund III

## 2015-09-16 NOTE — Progress Notes (Signed)
[facility] Progress Note  Chief Complaint: Left lower quadrant pain  Subjective History:  Mr. Wandel follows up with me after his recent colonoscopy. About 2 weeks ago he was having persistent sharp left lower quadrant pain that lasted about a week, there were no clear triggers or relieving factors and it was nonradiating. There was no change in bowel habits or rectal bleeding. He says he had similar symptoms about 6 months prior which prompted a CT scan. That study was normal except for the abnormalities in the ileocecal region related to his inflammatory polyp. The left lower quadrant pain resolved about 10 days ago. He has not had right-sided abdominal pain nor diarrhea. His colonoscopy still showed a sizable inflammatory polyp arising from the IC valve and probably extending into the distal ileum. As a result, the TI could not be intubated and seen very well.  ROS: Cardiovascular:  no chest pain Respiratory: no dyspnea  The patient's Past Medical, Family and Social History were reviewed and are on file in the EMR.  Objective:  Med list reviewed  Vital signs in last 24 hrs: Filed Vitals:   09/16/15 1529  BP: 110/70  Pulse: 72    Physical Exam   HEENT: sclera anicteric, oral mucosa moist without lesions  Neck: supple, no thyromegaly, JVD or lymphadenopathy  Cardiac: RRR without murmurs, S1S2 heard, no peripheral edema  Pulm: clear to auscultation bilaterally, normal RR and effort noted  Abdomen: soft, No tenderness, with active bowel sounds. No guarding or palpable hepatosplenomegaly.  Skin; warm and dry, no jaundice or rash  Recent Labs:  Pathology confirms inflammatory polyp. I reviewed this result with the patient today.    CT scan of September 2016 was personally reviewed the findings as above  @ASSESSMENTPLANBEGIN @ Assessment: Encounter Diagnoses  Name Primary?  Marland Kitchen LLQ abdominal pain Yes  . Benign colon polyp     The nature of this inflammatory polyp is  unclear. He does not seem to have Crohn's disease, as he has no pain or diarrhea, and I have not seen a polypoid inflammatory reaction like that in the past from Crohn's. When he was first diagnosed last year, Dr. Deatra Ina sent him to surgery feeling he should have an ileocecectomy. There were concerns that he could develop obstruction or bleeding from this polyp. The time, he could not see [facility] surgery due to insurance issue, certainly wound up with a surgeon in [facility]. Incomplete records seem to indicate that he made 2 attempts to remove more of the polyp, but surgery was not entertained. I share the same concern that this polyp could eventually cause obstruction or bleeding, and surgical evaluation is warranted. He is going to see Dr. Leighton Ruff in about 2 weeks. I will have a discussion with her about it prior to that visit.     Nelida Meuse III

## 2015-09-26 ENCOUNTER — Other Ambulatory Visit: Payer: Self-pay | Admitting: General Surgery

## 2015-09-26 NOTE — H&P (Signed)
History of Present Illness Randy Ruff MD; 05/28/5518 3:18 PM) The patient is a 50 year old male who presents with a colonic polyp. 50 year old male who underwent a colonoscopy by Dr. Deatra Ina 1 year ago. This showed an inflammatory polyp of the ileocecal valve. It was recommended that he follow-up with surgery for resection. He ended up having to go to [place] due to insurance reasons. They performed 2 colonoscopies but were unable to endoscopically resect this. He went back to the bowel or GI and it was recommended that he undergo a repeat colonoscopy to evaluate for whether the polyp was still there. This showed a persistent inflammatory polyp. He is not thought to have Crohn's disease as he has no abdominal pain or diarrhea. His CT scan also shows inflammation in the ileocecal region. He denies any blood in his bowel movements.   Other Problems Marjean Donna, CMA; 09/26/2015 2:48 PM) Hemorrhoids No pertinent past medical history  Past Surgical History Marjean Donna, CMA; 09/26/2015 2:48 PM) Anal Fissure Repair Shoulder Surgery Left.  Diagnostic Studies History Marjean Donna, CMA; 09/26/2015 2:48 PM) Colonoscopy within last year  Allergies Marjean Donna, [place]; 09/26/2015 2:49 PM) Shellfish-derived Products  Medication History ([place]; 09/26/2015 2:49 PM) TraMADol HCl (50MG Tablet, Oral as needed) Active. Medications Reconciled  Social History Marjean Donna, CMA; 09/26/2015 2:48 PM) Alcohol use Occasional alcohol use. Caffeine use Coffee. Illicit drug use Remotely quit drug use. Tobacco use Former smoker.  Family History Marjean Donna, [place]; 09/26/2015 2:48 PM) Arthritis Mother. Arthritis Mother. Cerebrovascular Accident Brother. Hypertension Brother. Migraine Headache Sister. Respiratory Condition Mother.     Review of Systems Davy Pique Bynum CMA; 09/26/2015 2:48 PM) General Not Present- Appetite Loss, Chills, Fatigue, Fever, Night Sweats, Weight Gain and  Weight Loss. Skin Not Present- Change in Wart/Mole, Dryness, Hives, Jaundice, New Lesions, Non-Healing Wounds, Rash and Ulcer. HEENT Present- Seasonal Allergies. Not Present- Earache, Hearing Loss, Hoarseness, Nose Bleed, Oral Ulcers, Ringing in the Ears, Sinus Pain, Sore Throat, Visual Disturbances, Wears glasses/contact lenses and Yellow Eyes. Cardiovascular Not Present- Chest Pain, Difficulty Breathing Lying Down, Leg Cramps, Palpitations, Rapid Heart Rate, Shortness of Breath and Swelling of Extremities. Gastrointestinal Not Present- Abdominal Pain, Bloating, Bloody Stool, Change in Bowel Habits, Chronic diarrhea, Constipation, Difficulty Swallowing, Excessive gas, Gets full quickly at meals, Hemorrhoids, Indigestion, Nausea, Rectal Pain and Vomiting. Male Genitourinary Not Present- Blood in Urine, Change in Urinary Stream, Frequency, Impotence, Nocturia, Painful Urination, Urgency and Urine Leakage. Neurological Not Present- Decreased Memory, Fainting, Headaches, Numbness, Seizures, Tingling, Tremor, Trouble walking and Weakness. Psychiatric Not Present- Anxiety, Bipolar, Change in Sleep Pattern, Depression, Fearful and Frequent crying. Endocrine Not Present- Cold Intolerance, Excessive Hunger, Hair Changes, Heat Intolerance, Hot flashes and New Diabetes. Hematology Not Present- Easy Bruising, Excessive bleeding, Gland problems, HIV and Persistent Infections.  Vitals (Sonya Bynum CMA; 09/26/2015 2:49 PM) 09/26/2015 2:48 PM Weight: 238 lb Height: 73in Body Surface Area: 2.32 m Body Mass Index: 31.4 kg/m  Temp.: 68F(Temporal)  Pulse: 81 (Regular)  BP: 128/76 (Sitting, Left Arm, Standard)      Physical Exam Randy Ruff MD; 8/0/2233 4:03 PM)  General Mental Status-Alert. General Appearance-Not in acute distress. Build & Nutrition-Well nourished. Posture-Normal posture. Gait-Normal.  Head and Neck Head-normocephalic, atraumatic with no lesions or palpable  masses. Trachea-midline.  Chest and Lung Exam Chest and lung exam reveals -on auscultation, normal breath sounds, no adventitious sounds and normal vocal resonance.  Cardiovascular Cardiovascular examination reveals -normal heart sounds, regular rate and rhythm with no murmurs.  Abdomen Inspection Inspection of the abdomen reveals - No Hernias. Palpation/Percussion Palpation and Percussion of the abdomen reveal - Soft, Non Tender, No Rigidity (guarding), No hepatosplenomegaly and No Palpable abdominal masses.  Neurologic Neurologic evaluation reveals -alert and oriented x 3 with no impairment of recent or remote memory, normal attention span and ability to concentrate, normal sensation and normal coordination.  Musculoskeletal Normal Exam - Bilateral-Upper Extremity Strength Normal and Lower Extremity Strength Normal.    Assessment & Plan Randy Ruff MD; 07/05/9789 4:04 PM)  BENIGN NEOPLASM OF ILEOCECAL VALVE (D12.0) Impression: 50 year old male with ileocecal valve polyp seen on multiple colonoscopy studies. It is unable to be endoscopically resected. Biopsies show inflammatory conditions. There is concern for possible obstruction in the near future. I have recommended excision to prevent this from occurring. We will plan on doing a laparoscopic ileocecectomy. This was explained in detail to the patient and all questions were answered. The surgery and anatomy were described to the patient as well as the risks of surgery and the possible complications. These include: Bleeding, deep abdominal infections and possible wound complications such as hernia and infection, damage to adjacent structures, leak of surgical connections, which can lead to other surgeries and possibly an ostomy, possible need for other procedures, such as abscess drains in radiology, possible prolonged hospital stay, possible diarrhea from removal of part of the colon, possible constipation from narcotics,  possible bowel, bladder or sexual dysfunction if having rectal surgery, prolonged fatigue/weakness or appetite loss, possible early recurrence of of disease, possible complications of their medical problems such as heart disease or arrhythmias or lung problems, death (less than 1%). I believe the patient understands and wishes to proceed with the surgery.

## 2015-10-26 NOTE — Patient Instructions (Addendum)
EMAD BRECHTEL  10/26/2015   Your procedure is scheduled on: 6-717  Report to [facility] Main  Entrance take [facility]  elevators to 3rd floor to  [place] at 1030  AM.  Call this number if you have problems the morning of surgery [phone]   Remember: ONLY 1 PERSON MAY GO WITH YOU TO SHORT STAY TO GET  READY MORNING OF [place].  Do not eat food or drink liquids :After Midnight.     Take these medicines the morning of surgery with A SIP OF WATER: EYE DROP IF NEEDED             You may not have any metal on your body including hair pins and              piercings  Do not wear jewelry, make-up, lotions, powders or perfumes, deodorant             Do not wear nail polish.  Do not shave  48 hours prior to surgery.              Men may shave face and neck.   Do not bring valuables to the hospital. [place].  Contacts, dentures or bridgework may not be worn into surgery.  Leave suitcase in the car. After surgery it may be brought to your room.                  Please read over the following fact sheets you were given: _____________________________________________________________________             [facility] - Preparing for Surgery Before surgery, you can play an important role.  Because skin is not sterile, your skin needs to be as free of germs as possible.  You can reduce the number of germs on your skin by washing with CHG (chlorahexidine gluconate) soap before surgery.  CHG is an antiseptic cleaner which kills germs and bonds with the skin to continue killing germs even after washing. Please DO NOT use if you have an allergy to CHG or antibacterial soaps.  If your skin becomes reddened/irritated stop using the CHG and inform your nurse when you arrive at Short Stay. Do not shave (including legs and underarms) for at least 48 hours prior to the first CHG shower.  You may shave your face/neck. Please  follow these instructions carefully:  1.  Shower with CHG Soap the night before surgery and the  morning of Surgery.  2.  If you choose to wash your hair, wash your hair first as usual with your  normal  shampoo.  3.  After you shampoo, rinse your hair and body thoroughly to remove the  shampoo.                           4.  Use CHG as you would any other liquid soap.  You can apply chg directly  to the skin and wash                       Gently with a scrungie or clean washcloth.  5.  Apply the CHG Soap to your body ONLY FROM THE NECK DOWN.   Do not use on face/  open                           Wound or open sores. Avoid contact with eyes, ears mouth and genitals (private parts).                       Wash face,  Genitals (private parts) with your normal soap.             6.  Wash thoroughly, paying special attention to the area where your surgery  will be performed.  7.  Thoroughly rinse your body with warm water from the neck down.  8.  DO NOT shower/wash with your normal soap after using and rinsing off  the CHG Soap.                9.  Pat yourself dry with a clean towel.            10.  Wear clean pajamas.            11.  Place clean sheets on your bed the night of your first shower and do not  sleep with pets. Day of Surgery : Do not apply any lotions/deodorants the morning of surgery.  Please wear clean clothes to the hospital/surgery center.  FAILURE TO FOLLOW THESE INSTRUCTIONS MAY RESULT IN THE CANCELLATION OF YOUR SURGERY PATIENT SIGNATURE_________________________________  NURSE SIGNATURE__________________________________  ________________________________________________________________________  WHAT IS A BLOOD TRANSFUSION? Blood Transfusion Information  A transfusion is the replacement of blood or some of its parts. Blood is made up of multiple cells which provide different functions.  Red blood cells carry oxygen and are used for blood loss replacement.  White blood cells  fight against infection.  Platelets control bleeding.  Plasma helps clot blood.  Other blood products are available for specialized needs, such as hemophilia or other clotting disorders. BEFORE THE TRANSFUSION  Who gives blood for transfusions?   Healthy volunteers who are fully evaluated to make sure their blood is safe. This is blood bank blood. Transfusion therapy is the safest it has ever been in the practice of medicine. Before blood is taken from a donor, a complete history is taken to make sure that person has no history of diseases nor engages in risky social behavior (examples are intravenous drug use or sexual activity with multiple partners). The donor's travel history is screened to minimize risk of transmitting infections, such as malaria. The donated blood is tested for signs of infectious diseases, such as HIV and hepatitis. The blood is then tested to be sure it is compatible with you in order to minimize the chance of a transfusion reaction. If you or a relative donates blood, this is often done in anticipation of surgery and is not appropriate for emergency situations. It takes many days to process the donated blood. RISKS AND COMPLICATIONS Although transfusion therapy is very safe and saves many lives, the main dangers of transfusion include:   Getting an infectious disease.  Developing a transfusion reaction. This is an allergic reaction to something in the blood you were given. Every precaution is taken to prevent this. The decision to have a blood transfusion has been considered carefully by your caregiver before blood is given. Blood is not given unless the benefits outweigh the risks. AFTER THE TRANSFUSION  Right after receiving a blood transfusion, you will usually feel much better and more energetic. This is especially true if your red blood  cells have gotten low (anemic). The transfusion raises the level of the red blood cells which carry oxygen, and this usually  causes an energy increase.  The nurse administering the transfusion will monitor you carefully for complications. HOME CARE INSTRUCTIONS  No special instructions are needed after a transfusion. You may find your energy is better. Speak with your caregiver about any limitations on activity for underlying diseases you may have. SEEK MEDICAL CARE IF:   Your condition is not improving after your transfusion.  You develop redness or irritation at the intravenous (IV) site. SEEK IMMEDIATE MEDICAL CARE IF:  Any of the following symptoms occur over the next 12 hours:  Shaking chills.  You have a temperature by mouth above 102 F (38.9 C), not controlled by medicine.  Chest, back, or muscle pain.  People around you feel you are not acting correctly or are confused.  Shortness of breath or difficulty breathing.  Dizziness and fainting.  You get a rash or develop hives.  You have a decrease in urine output.  Your urine turns a dark color or changes to pink, red, or brown. Any of the following symptoms occur over the next 10 days:  You have a temperature by mouth above 102 F (38.9 C), not controlled by medicine.  Shortness of breath.  Weakness after normal activity.  The white part of the eye turns yellow (jaundice).  You have a decrease in the amount of urine or are urinating less often.  Your urine turns a dark color or changes to pink, red, or brown. Document Released: 05/11/2000 Document Revised: 08/06/2011 Document Reviewed: 12/29/2007 [facility] Patient Information 2014 [place], [place].  _______________________________________________________________________

## 2015-10-28 ENCOUNTER — Encounter (HOSPITAL_COMMUNITY)
Admission: RE | Admit: 2015-10-28 | Discharge: 2015-10-28 | Disposition: A | Discharge status: Home / Self Care | Payer: BLUE CROSS/BLUE SHIELD | Source: Ambulatory Visit | Attending: General Surgery | Admitting: General Surgery

## 2015-10-28 ENCOUNTER — Encounter (HOSPITAL_COMMUNITY): Payer: Self-pay

## 2015-10-28 DIAGNOSIS — Z01812 Encounter for preprocedural laboratory examination: Secondary | ICD-10-CM | POA: Diagnosis not present

## 2015-10-28 HISTORY — DX: Unspecified asthma, uncomplicated: J45.909

## 2015-10-28 LAB — CBC
HCT: 41.3 % (ref 39.0–52.0)
Hemoglobin: 14.3 g/dL (ref 13.0–17.0)
MCH: 28.4 pg (ref 26.0–34.0)
MCHC: 34.6 g/dL (ref 30.0–36.0)
MCV: 82.1 fL (ref 78.0–100.0)
PLATELETS: 206 10*3/uL (ref 150–400)
RBC: 5.03 MIL/uL (ref 4.22–5.81)
RDW: 13 % (ref 11.5–15.5)
WBC: 5.7 10*3/uL (ref 4.0–10.5)

## 2015-10-28 LAB — ABO/RH: ABO/RH(D): A POS

## 2015-10-29 LAB — HEMOGLOBIN A1C
Hgb A1c MFr Bld: 5.9 % — ABNORMAL HIGH (ref 4.8–5.6)
MEAN PLASMA GLUCOSE: 123 mg/dL

## 2015-10-29 NOTE — Anesthesia Preprocedure Evaluation (Addendum)
Anesthesia Evaluation  Patient identified by MRN, date of birth, ID band Patient awake    Reviewed: Allergy & Precautions, NPO status , Patient's Chart, lab work & pertinent test results  Airway Mallampati: II   Neck ROM: Full    Dental  (+) Partial Upper   Pulmonary neg pulmonary ROS, former smoker (quit 2007, 25 pack year hx),    breath sounds clear to auscultation       Cardiovascular negative cardio ROS   Rhythm:Regular     Neuro/Psych negative neurological ROS  negative psych ROS   GI/Hepatic negative GI ROS, Neg liver ROS, (+)     substance abuse (In past)  , Imflamatory polyp     Endo/Other  negative endocrine ROS  Renal/GU negative Renal ROS  negative genitourinary   Musculoskeletal negative musculoskeletal ROS (+)   Abdominal   Peds negative pediatric ROS (+)  Hematology negative hematology ROS (+) 14/41   Anesthesia Other Findings   Reproductive/Obstetrics negative OB ROS                            Anesthesia Physical Anesthesia Plan  ASA: II  Anesthesia Plan: General   Post-op Pain Management:    Induction: Intravenous  Airway Management Planned: Oral ETT  Additional Equipment:   Intra-op Plan:   Post-operative Plan: Extubation in OR  Informed Consent: I have reviewed the patients History and Physical, chart, labs and discussed the procedure including the risks, benefits and alternatives for the proposed anesthesia with the patient or authorized representative who has indicated his/her understanding and acceptance.     Plan Discussed with:   Anesthesia Plan Comments: (Multimodal pain RX)        Anesthesia Quick Evaluation

## 2015-11-02 ENCOUNTER — Inpatient Hospital Stay (HOSPITAL_COMMUNITY): Payer: BLUE CROSS/BLUE SHIELD | Admitting: Anesthesiology

## 2015-11-02 ENCOUNTER — Inpatient Hospital Stay (HOSPITAL_COMMUNITY)
Admission: RE | Admit: 2015-11-02 | Discharge: 2015-11-06 | DRG: 331 | Disposition: A | Discharge status: Home / Self Care | Payer: BLUE CROSS/BLUE SHIELD | Source: Ambulatory Visit | Attending: General Surgery | Admitting: General Surgery

## 2015-11-02 ENCOUNTER — Encounter (HOSPITAL_COMMUNITY)
Admission: RE | Disposition: A | Discharge status: Home / Self Care | Payer: Self-pay | Source: Ambulatory Visit | Attending: General Surgery

## 2015-11-02 ENCOUNTER — Encounter (HOSPITAL_COMMUNITY): Payer: Self-pay | Admitting: *Deleted

## 2015-11-02 DIAGNOSIS — Z87891 Personal history of nicotine dependence: Secondary | ICD-10-CM | POA: Diagnosis not present

## 2015-11-02 DIAGNOSIS — D120 Benign neoplasm of cecum: Secondary | ICD-10-CM | POA: Diagnosis present

## 2015-11-02 DIAGNOSIS — Z8261 Family history of arthritis: Secondary | ICD-10-CM | POA: Diagnosis not present

## 2015-11-02 DIAGNOSIS — Z91013 Allergy to seafood: Secondary | ICD-10-CM

## 2015-11-02 DIAGNOSIS — Z8249 Family history of ischemic heart disease and other diseases of the circulatory system: Secondary | ICD-10-CM | POA: Diagnosis not present

## 2015-11-02 DIAGNOSIS — Z823 Family history of stroke: Secondary | ICD-10-CM

## 2015-11-02 HISTORY — PX: LAPAROSCOPIC ILEOCECECTOMY: SHX5898

## 2015-11-02 SURGERY — EXCISION, CECUM WITH ILEUM, LAPAROSCOPIC
Anesthesia: General

## 2015-11-02 MED ORDER — BUPIVACAINE-EPINEPHRINE 0.25% -1:200000 IJ SOLN
INTRAMUSCULAR | Status: DC | PRN
  Administered 2015-11-02: 20 mL

## 2015-11-02 MED ORDER — MIDAZOLAM HCL 2 MG/2ML IJ SOLN
INTRAMUSCULAR | Status: AC
  Filled 2015-11-02: qty 2

## 2015-11-02 MED ORDER — HYDROMORPHONE HCL 1 MG/ML IJ SOLN
INTRAMUSCULAR | Status: AC
  Filled 2015-11-02: qty 1

## 2015-11-02 MED ORDER — FENTANYL CITRATE (PF) 100 MCG/2ML IJ SOLN
INTRAMUSCULAR | Status: AC
  Filled 2015-11-02: qty 2

## 2015-11-02 MED ORDER — LACTATED RINGERS IV SOLN
INTRAVENOUS | Status: DC
  Administered 2015-11-02 (×2): 1000 mL via INTRAVENOUS
  Administered 2015-11-02: 14:00:00 via INTRAVENOUS

## 2015-11-02 MED ORDER — MORPHINE SULFATE (PF) 2 MG/ML IV SOLN
2.0000 mg | INTRAVENOUS | Status: DC | PRN
  Administered 2015-11-02 – 2015-11-03 (×4): 6 mg via INTRAVENOUS
  Administered 2015-11-04: 2 mg via INTRAVENOUS
  Administered 2015-11-04 (×3): 4 mg via INTRAVENOUS
  Filled 2015-11-02 (×2): qty 3
  Filled 2015-11-02: qty 2
  Filled 2015-11-02: qty 1
  Filled 2015-11-02: qty 2
  Filled 2015-11-02: qty 3
  Filled 2015-11-02: qty 2
  Filled 2015-11-02: qty 3

## 2015-11-02 MED ORDER — 0.9 % SODIUM CHLORIDE (POUR BTL) OPTIME
TOPICAL | Status: DC | PRN
  Administered 2015-11-02: 1000 mL

## 2015-11-02 MED ORDER — MEPERIDINE HCL 50 MG/ML IJ SOLN: 6.2500 mg | INTRAMUSCULAR | Status: DC | PRN

## 2015-11-02 MED ORDER — DEXTROSE 5 % IV SOLN
2.0000 g | Freq: Two times a day (BID) | INTRAVENOUS | Status: AC
  Administered 2015-11-03: 2 g via INTRAVENOUS
  Filled 2015-11-02: qty 2

## 2015-11-02 MED ORDER — ALVIMOPAN 12 MG PO CAPS
12.0000 mg | ORAL_CAPSULE | Freq: Two times a day (BID) | ORAL | Status: DC
  Administered 2015-11-03 – 2015-11-06 (×7): 12 mg via ORAL
  Filled 2015-11-02 (×11): qty 1

## 2015-11-02 MED ORDER — ONDANSETRON HCL 4 MG PO TABS: 4.0000 mg | ORAL_TABLET | Freq: Four times a day (QID) | ORAL | Status: DC | PRN

## 2015-11-02 MED ORDER — FENTANYL CITRATE (PF) 250 MCG/5ML IJ SOLN
INTRAMUSCULAR | Status: AC
  Filled 2015-11-02: qty 5

## 2015-11-02 MED ORDER — DIPHENHYDRAMINE HCL 25 MG PO CAPS: 25.0000 mg | ORAL_CAPSULE | Freq: Four times a day (QID) | ORAL | Status: DC | PRN

## 2015-11-02 MED ORDER — CEFOTETAN DISODIUM-DEXTROSE 2-2.08 GM-% IV SOLR
INTRAVENOUS | Status: AC
  Filled 2015-11-02: qty 50

## 2015-11-02 MED ORDER — ROCURONIUM BROMIDE 100 MG/10ML IV SOLN
INTRAVENOUS | Status: DC | PRN
  Administered 2015-11-02 (×3): 10 mg via INTRAVENOUS
  Administered 2015-11-02: 50 mg via INTRAVENOUS
  Administered 2015-11-02: 20 mg via INTRAVENOUS

## 2015-11-02 MED ORDER — SUGAMMADEX SODIUM 500 MG/5ML IV SOLN
INTRAVENOUS | Status: AC
  Filled 2015-11-02: qty 5

## 2015-11-02 MED ORDER — BUPIVACAINE-EPINEPHRINE (PF) 0.25% -1:200000 IJ SOLN
INTRAMUSCULAR | Status: AC
  Filled 2015-11-02: qty 30

## 2015-11-02 MED ORDER — SODIUM CHLORIDE 0.9 % IJ SOLN
INTRAMUSCULAR | Status: AC
  Filled 2015-11-02: qty 20

## 2015-11-02 MED ORDER — SUGAMMADEX SODIUM 200 MG/2ML IV SOLN
INTRAVENOUS | Status: DC | PRN
  Administered 2015-11-02: 220 mg via INTRAVENOUS

## 2015-11-02 MED ORDER — ROCURONIUM BROMIDE 100 MG/10ML IV SOLN
INTRAVENOUS | Status: AC
  Filled 2015-11-02: qty 1

## 2015-11-02 MED ORDER — PROPOFOL 10 MG/ML IV BOLUS
INTRAVENOUS | Status: DC | PRN
  Administered 2015-11-02: 200 mg via INTRAVENOUS

## 2015-11-02 MED ORDER — MORPHINE SULFATE (PF) 2 MG/ML IV SOLN
2.0000 mg | INTRAVENOUS | Status: DC | PRN
  Administered 2015-11-02: 4 mg via INTRAVENOUS
  Filled 2015-11-02: qty 2

## 2015-11-02 MED ORDER — HYDRALAZINE HCL 20 MG/ML IJ SOLN
INTRAMUSCULAR | Status: DC | PRN
  Administered 2015-11-02 (×3): 5 mg via INTRAVENOUS

## 2015-11-02 MED ORDER — ALUM & MAG HYDROXIDE-SIMETH 200-200-20 MG/5ML PO SUSP: 30.0000 mL | Freq: Four times a day (QID) | ORAL | Status: DC | PRN

## 2015-11-02 MED ORDER — SUCCINYLCHOLINE CHLORIDE 20 MG/ML IJ SOLN
INTRAMUSCULAR | Status: DC | PRN
  Administered 2015-11-02: 140 mg via INTRAVENOUS

## 2015-11-02 MED ORDER — PROMETHAZINE HCL 25 MG/ML IJ SOLN: 6.2500 mg | INTRAMUSCULAR | Status: DC | PRN

## 2015-11-02 MED ORDER — ALVIMOPAN 12 MG PO CAPS
12.0000 mg | ORAL_CAPSULE | Freq: Once | ORAL | Status: AC
  Administered 2015-11-02: 12 mg via ORAL
  Filled 2015-11-02: qty 1

## 2015-11-02 MED ORDER — METOPROLOL TARTRATE 5 MG/5ML IV SOLN: 5.0000 mg | Freq: Four times a day (QID) | INTRAVENOUS | Status: DC | PRN

## 2015-11-02 MED ORDER — LACTATED RINGERS IR SOLN
Status: DC | PRN
  Administered 2015-11-02 (×2): 1000 mL

## 2015-11-02 MED ORDER — ENOXAPARIN SODIUM 40 MG/0.4ML ~~LOC~~ SOLN
40.0000 mg | SUBCUTANEOUS | Status: DC
  Administered 2015-11-03 – 2015-11-06 (×4): 40 mg via SUBCUTANEOUS
  Filled 2015-11-02 (×4): qty 0.4

## 2015-11-02 MED ORDER — HYDRALAZINE HCL 20 MG/ML IJ SOLN
INTRAMUSCULAR | Status: AC
  Filled 2015-11-02: qty 1

## 2015-11-02 MED ORDER — CETYLPYRIDINIUM CHLORIDE 0.05 % MT LIQD
7.0000 mL | Freq: Two times a day (BID) | OROMUCOSAL | Status: DC
  Administered 2015-11-02 – 2015-11-05 (×5): 7 mL via OROMUCOSAL

## 2015-11-02 MED ORDER — DEXTROSE 5 % IV SOLN
2.0000 g | INTRAVENOUS | Status: AC
  Administered 2015-11-02: 2 g via INTRAVENOUS

## 2015-11-02 MED ORDER — LABETALOL HCL 5 MG/ML IV SOLN
INTRAVENOUS | Status: AC
  Filled 2015-11-02: qty 4

## 2015-11-02 MED ORDER — MIDAZOLAM HCL 5 MG/5ML IJ SOLN
INTRAMUSCULAR | Status: DC | PRN
  Administered 2015-11-02: 2 mg via INTRAVENOUS

## 2015-11-02 MED ORDER — KCL IN DEXTROSE-NACL 20-5-0.45 MEQ/L-%-% IV SOLN
INTRAVENOUS | Status: DC
  Administered 2015-11-02 – 2015-11-05 (×4): via INTRAVENOUS
  Filled 2015-11-02 (×8): qty 1000

## 2015-11-02 MED ORDER — LABETALOL HCL 5 MG/ML IV SOLN
INTRAVENOUS | Status: DC | PRN
  Administered 2015-11-02 (×2): 5 mg via INTRAVENOUS

## 2015-11-02 MED ORDER — EPHEDRINE SULFATE 50 MG/ML IJ SOLN
INTRAMUSCULAR | Status: AC
  Filled 2015-11-02: qty 1

## 2015-11-02 MED ORDER — FENTANYL CITRATE (PF) 100 MCG/2ML IJ SOLN
25.0000 ug | INTRAMUSCULAR | Status: DC | PRN
  Administered 2015-11-02 (×3): 50 ug via INTRAVENOUS

## 2015-11-02 MED ORDER — KETOROLAC TROMETHAMINE 15 MG/ML IJ SOLN
15.0000 mg | Freq: Three times a day (TID) | INTRAMUSCULAR | Status: DC | PRN
  Administered 2015-11-03 – 2015-11-05 (×2): 30 mg via INTRAVENOUS
  Filled 2015-11-02 (×2): qty 2

## 2015-11-02 MED ORDER — FENTANYL CITRATE (PF) 100 MCG/2ML IJ SOLN
INTRAMUSCULAR | Status: DC | PRN
  Administered 2015-11-02: 100 ug via INTRAVENOUS
  Administered 2015-11-02 (×8): 50 ug via INTRAVENOUS

## 2015-11-02 MED ORDER — ONDANSETRON HCL 4 MG/2ML IJ SOLN
4.0000 mg | Freq: Four times a day (QID) | INTRAMUSCULAR | Status: DC | PRN
  Administered 2015-11-04 – 2015-11-05 (×2): 4 mg via INTRAVENOUS
  Filled 2015-11-02 (×2): qty 2

## 2015-11-02 MED ORDER — LIDOCAINE HCL (CARDIAC) 20 MG/ML IV SOLN
INTRAVENOUS | Status: AC
  Filled 2015-11-02: qty 5

## 2015-11-02 MED ORDER — DIPHENHYDRAMINE HCL 50 MG/ML IJ SOLN: 25.0000 mg | Freq: Four times a day (QID) | INTRAMUSCULAR | Status: DC | PRN

## 2015-11-02 MED ORDER — HYDROMORPHONE HCL 1 MG/ML IJ SOLN
0.2500 mg | INTRAMUSCULAR | Status: DC | PRN
  Administered 2015-11-02: 0.25 mg via INTRAVENOUS
  Administered 2015-11-02: 0.5 mg via INTRAVENOUS
  Administered 2015-11-02: 0.25 mg via INTRAVENOUS
  Administered 2015-11-02: 0.5 mg via INTRAVENOUS

## 2015-11-02 MED ORDER — PROPOFOL 10 MG/ML IV BOLUS
INTRAVENOUS | Status: AC
  Filled 2015-11-02: qty 20

## 2015-11-02 MED ORDER — ACETAMINOPHEN 500 MG PO TABS
1000.0000 mg | ORAL_TABLET | Freq: Four times a day (QID) | ORAL | Status: AC
Start: 2015-11-02 — End: 2015-11-03
  Administered 2015-11-02 – 2015-11-03 (×4): 1000 mg via ORAL
  Filled 2015-11-02 (×4): qty 2

## 2015-11-02 SURGICAL SUPPLY — 80 items
APPLIER CLIP 5 13 M/L LIGAMAX5 (MISCELLANEOUS)
APR CLP MED LRG 5 ANG JAW (MISCELLANEOUS)
BLADE EXTENDED COATED 6.5IN (ELECTRODE) ×1 IMPLANT
CABLE HIGH FREQUENCY MONO STRZ (ELECTRODE) IMPLANT
CELLS DAT CNTRL 66122 CELL SVR (MISCELLANEOUS) ×1 IMPLANT
CHLORAPREP W/TINT 26ML (MISCELLANEOUS) ×1 IMPLANT
CLIP APPLIE 5 13 M/L LIGAMAX5 (MISCELLANEOUS) IMPLANT
COVER MAYO STAND STRL (DRAPES) ×6 IMPLANT
COVER SURGICAL LIGHT HANDLE (MISCELLANEOUS) ×2 IMPLANT
DECANTER SPIKE VIAL GLASS SM (MISCELLANEOUS) ×1 IMPLANT
DRAIN CHANNEL 19F RND (DRAIN) IMPLANT
DRAPE LAPAROSCOPIC ABDOMINAL (DRAPES) ×2 IMPLANT
DRAPE SURG IRRIG POUCH 19X23 (DRAPES) ×2 IMPLANT
DRSG DERMACEA 8X12 NADH (GAUZE/BANDAGES/DRESSINGS) ×1 IMPLANT
DRSG OPSITE POSTOP 4X10 (GAUZE/BANDAGES/DRESSINGS) IMPLANT
DRSG OPSITE POSTOP 4X6 (GAUZE/BANDAGES/DRESSINGS) ×2 IMPLANT
DRSG OPSITE POSTOP 4X8 (GAUZE/BANDAGES/DRESSINGS) IMPLANT
ELECT PENCIL ROCKER SW 15FT (MISCELLANEOUS) ×4 IMPLANT
ELECT REM PT RETURN 15FT ADLT (MISCELLANEOUS) ×2 IMPLANT
ENDOLOOP SUT PDS II  0 18 (SUTURE)
ENDOLOOP SUT PDS II 0 18 (SUTURE) IMPLANT
EVACUATOR SILICONE 100CC (DRAIN) IMPLANT
GAUZE SPONGE 4X4 12PLY STRL (GAUZE/BANDAGES/DRESSINGS) IMPLANT
GLOVE BIO SURGEON STRL SZ 6.5 (GLOVE) ×4 IMPLANT
GLOVE BIOGEL PI IND STRL 7.0 (GLOVE) ×2 IMPLANT
GLOVE BIOGEL PI INDICATOR 7.0 (GLOVE) ×2
GOWN STRL REUS W/TWL 2XL LVL3 (GOWN DISPOSABLE) ×6 IMPLANT
GOWN STRL REUS W/TWL XL LVL3 (GOWN DISPOSABLE) ×12 IMPLANT
HANDLE SUCTION POOLE (INSTRUMENTS) IMPLANT
LEGGING LITHOTOMY PAIR STRL (DRAPES) ×1 IMPLANT
LIGASURE IMPACT 36 18CM CVD LR (INSTRUMENTS) IMPLANT
LUBRICANT JELLY K Y 4OZ (MISCELLANEOUS) ×2 IMPLANT
PACK COLON (CUSTOM PROCEDURE TRAY) ×2 IMPLANT
PAD POSITIONING PINK XL (MISCELLANEOUS) ×2 IMPLANT
PORT LAP GEL ALEXIS MED 5-9CM (MISCELLANEOUS) ×2 IMPLANT
POSITIONER SURGICAL ARM (MISCELLANEOUS) IMPLANT
RELOAD PROXIMATE 75MM BLUE (ENDOMECHANICALS) ×4 IMPLANT
RELOAD STAPLE 75 3.8 BLU REG (ENDOMECHANICALS) IMPLANT
RETRACTOR WND ALEXIS 18 MED (MISCELLANEOUS) IMPLANT
RTRCTR WOUND ALEXIS 18CM MED (MISCELLANEOUS) ×2
SCISSORS LAP 5X35 DISP (ENDOMECHANICALS) ×2 IMPLANT
SEALER TISSUE G2 STRG ARTC 35C (ENDOMECHANICALS) ×1 IMPLANT
SET IRRIG TUBING LAPAROSCOPIC (IRRIGATION / IRRIGATOR) ×2 IMPLANT
SLEEVE XCEL OPT CAN 5 100 (ENDOMECHANICALS) ×2 IMPLANT
SPONGE LAP 18X18 X RAY DECT (DISPOSABLE) IMPLANT
STAPLER GUN LINEAR PROX 60 (STAPLE) ×1 IMPLANT
STAPLER PROXIMATE 75MM BLUE (STAPLE) ×2 IMPLANT
STAPLER VISISTAT 35W (STAPLE) IMPLANT
SUCTION POOLE HANDLE (INSTRUMENTS)
SUT CHROMIC 0 BP (SUTURE) ×1 IMPLANT
SUT ETHILON 2 0 PS N (SUTURE) IMPLANT
SUT NOVA 1 T20/GS 25DT (SUTURE) ×3 IMPLANT
SUT NOVA NAB DX-16 0-1 5-0 T12 (SUTURE) IMPLANT
SUT PDS AB 1 CTX 36 (SUTURE) IMPLANT
SUT PDS AB 1 TP1 96 (SUTURE) IMPLANT
SUT PROLENE 2 0 KS (SUTURE) ×1 IMPLANT
SUT SILK 2 0 (SUTURE) ×4
SUT SILK 2 0 SH CR/8 (SUTURE) IMPLANT
SUT SILK 2-0 18XBRD TIE 12 (SUTURE) ×1 IMPLANT
SUT SILK 3 0 (SUTURE) ×2
SUT SILK 3 0 SH CR/8 (SUTURE) ×2 IMPLANT
SUT SILK 3-0 18XBRD TIE 12 (SUTURE) ×1 IMPLANT
SUT VIC AB 2-0 SH 18 (SUTURE) ×3 IMPLANT
SUT VIC AB 4-0 PS2 27 (SUTURE) ×4 IMPLANT
SUT VICRYL 0 UR6 27IN ABS (SUTURE) ×2 IMPLANT
SUT VICRYL 2 0 18  UND BR (SUTURE)
SUT VICRYL 2 0 18 UND BR (SUTURE) IMPLANT
SYS LAPSCP GELPORT 120MM (MISCELLANEOUS)
SYSTEM LAPSCP GELPORT 120MM (MISCELLANEOUS) IMPLANT
TAPE CLOTH 4X10 WHT NS (GAUZE/BANDAGES/DRESSINGS) IMPLANT
TOWEL OR 17X26 10 PK STRL BLUE (TOWEL DISPOSABLE) IMPLANT
TOWEL OR NON WOVEN STRL DISP B (DISPOSABLE) IMPLANT
TRAY FOLEY W/METER SILVER 14FR (SET/KITS/TRAYS/PACK) IMPLANT
TRAY FOLEY W/METER SILVER 16FR (SET/KITS/TRAYS/PACK) ×2 IMPLANT
TROCAR BLADELESS OPT 5 100 (ENDOMECHANICALS) ×2 IMPLANT
TROCAR XCEL 12X100 BLDLESS (ENDOMECHANICALS) IMPLANT
TROCAR XCEL BLUNT TIP 100MML (ENDOMECHANICALS) IMPLANT
TROCAR XCEL NON-BLD 11X100MML (ENDOMECHANICALS) IMPLANT
TUBING CONNECTING 10 (TUBING) ×2 IMPLANT
TUBING INSUF HEATED (TUBING) ×3 IMPLANT

## 2015-11-02 NOTE — Anesthesia Procedure Notes (Signed)
Procedure Name: Intubation Date/Time: 11/02/2015 12:44 PM Performed by: Anne Fu Pre-anesthesia Checklist: Patient identified, Emergency Drugs available, Suction available, Patient being monitored and Timeout performed Patient Re-evaluated:Patient Re-evaluated prior to inductionOxygen Delivery Method: Circle system utilized Preoxygenation: Pre-oxygenation with 100% oxygen Intubation Type: IV induction Ventilation: Mask ventilation without difficulty Laryngoscope Size: Mac and 4 Grade View: Grade I Tube type: Oral Tube size: 8.0 mm Number of attempts: 1 Airway Equipment and Method: Stylet Placement Confirmation: ETT inserted through vocal cords under direct vision,  positive ETCO2,  CO2 detector and breath sounds checked- equal and bilateral Secured at: 23 cm Tube secured with: Tape Dental Injury: Teeth and Oropharynx as per pre-operative assessment

## 2015-11-02 NOTE — Op Note (Signed)
11/02/2015  2:49 PM  PATIENT:  Randy Wilson  50 y.o. male  Patient Care Team: Roselee Culver, MD as PCP - General (Family Medicine)  PRE-OPERATIVE DIAGNOSIS:  ileocecal valve polyp  POST-OPERATIVE DIAGNOSIS:  ileocecal valve polyp  PROCEDURE:  LAPAROSCOPIC ILEOCECECTOMY  Surgeon(s): Leighton Ruff, MD  ASSISTANT: none   ANESTHESIA:   general  EBL:  Total I/O In: 1000 [I.V.:1000] Out: 200 [Urine:200]  SPECIMEN:  Source of Specimen:  terminal ileum and cecum  DISPOSITION OF SPECIMEN:  PATHOLOGY  COUNTS:  YES  PLAN OF CARE: Admit to inpatient   PATIENT DISPOSITION:  PACU - hemodynamically stable.   INDICATIONS: This is a 50 y.o. male who presented to my office with a terminal ileum inflammatory polyp which was partially obstructive. The risk and benefits and alternative treatments were explained to the patient prior to the OR and the patient has elected to proceed with laparoscopic ileocecectomy.  Consent was signed and placed on chart prior to the OR.   OR FINDINGS: Chronically inflamed ileocecal valve, friable mucosa, no creeping fat identified  DESCRIPTION:  The patient was identified & brought into the operating room. The patient was positioned supine with both arms tucked. SCDs were active during the entire case. The patient underwent general anesthesia without any difficulty. A foley catheter was inserted under sterile conditions. The abdomen was prepped and draped in a sterile fashion. A Surgical Timeout confirmed our plan.  I made a vertical incision around the umbilicus. Dissection was carried down to the fascia using cautery. The fascia was divided at midline and the peritoneum was entered using Metzenbaum scissors.  The fascia was enlarged and an [facility] wound protector was placed.  The cap was placed on the wound protector and the abdomen was insufflated to approximately 15 mmHg. Camera inspection revealed no injury. I placed additional ports under direct  laparoscopic visualization.  I evaluated the entire abdomen laparoscopically.  The liver appeared normal, the large and small bowel were normal as well.  The small bowel was normal with no creeping fat. There was no sign of any other disease except for some peritoneal sigmoid adhesions.  I then freed the appendix off its attachments to the pelvic wall. I mobilized the terminal ileum.  I took care to avoid injuring any retroperitoneal structures.  After this I began to mobilize laterally down the white line of [place] and to the hepatic flexure using the Enseal device. I mobilized the omentum off of the right transverse colon. The entire colon was then flipped medially and mobilized off of the retroperitoneal structures until I could mobilize the cecum past the midline incision.  At that point, the terminal ileum and right colon were then removed from the wound. The terminal ileum was transected using a GIA blue load stapler. The remaining mesentery was divided close to the bowel using the Enseal device. I identified a portion of the ascending colon just distal to the cecum. This was transected using another blue load GIA stapler.  An anastomosis was created between the terminal ileum and the ascending colon using a GIA blue load stapler.  The common enterotomy channel was closed using a TA 60 blue load stapler. Hemostasis was good at the staple line. Several 3-0 silk sutures were used to imbricate the edge of the anastomosis. An anti-tension suture was placed in the crotch of the anastomosis. This was then placed back into the abdomen.  The mesentery was closed gently with a 0 chromic suture to control some  mild oozing.  The abdomen was then irrigated with normal saline. All 4 quadrants of the abdomen were inspected. There was no signs of additional bleeding.  The omentum was then brought down over the anastomosis. The Alexis wound protector was removed, and we switched to clean instruments, gowns and drapes.  The  fascia was then closed using #0 Novafil interrupted sutures.  The subcutaneous tissue of the umbilical incision was closed using interrupted 2-0 Vicryl sutures. The skin was then closed using 4-0 Vicryl sutures. Dermabond was placed on the port sites and a sterile dressing was placed over the abdominal incision. All counts were correct per operating room staff. The patient was then awakened from anesthesia and sent to the post anesthesia care unit in stable condition.

## 2015-11-02 NOTE — H&P (Signed)
History of Present Illness  The patient is a 49 year old male who presents with a colonic polyp. 50 year old male who underwent a colonoscopy by Dr. Deatra Ina 1 year ago. This showed an inflammatory polyp of the ileocecal valve. It was recommended that he follow-up with surgery for resection. He ended up having to go to [place] due to insurance reasons. They performed 2 colonoscopies but were unable to endoscopically resect this. He went back to the bowel or GI and it was recommended that he undergo a repeat colonoscopy to evaluate for whether the polyp was still there. This showed a persistent inflammatory polyp. He is not thought to have Crohn's disease as he has no abdominal pain or diarrhea. His CT scan also shows inflammation in the ileocecal region. He denies any blood in his bowel movements.   Other Problems Marjean Donna, CMA; 09/26/2015 2:48 PM) Hemorrhoids No pertinent past medical history  Past Surgical History Marjean Donna, CMA; 09/26/2015 2:48 PM) Anal Fissure Repair Shoulder Surgery Left.  Diagnostic Studies History Marjean Donna, CMA; 09/26/2015 2:48 PM) Colonoscopy within last year  Allergies Marjean Donna, [place]; 09/26/2015 2:49 PM) Shellfish-derived Products  Medication History ([place]; 09/26/2015 2:49 PM) TraMADol HCl (50MG Tablet, Oral as needed) Active. Medications Reconciled  Social History Marjean Donna, CMA; 09/26/2015 2:48 PM) Alcohol use Occasional alcohol use. Caffeine use Coffee. Illicit drug use Remotely quit drug use. Tobacco use Former smoker.  Family History Marjean Donna, [place]; 09/26/2015 2:48 PM) Arthritis Mother. Arthritis Mother. Cerebrovascular Accident Brother. Hypertension Brother. Migraine Headache Sister. Respiratory Condition Mother.     Review of Systems  General Not Present- Appetite Loss, Chills, Fatigue, Fever, Night Sweats, Weight Gain and Weight Loss. Skin Not Present- Change in Wart/Mole, Dryness, Hives,  Jaundice, New Lesions, Non-Healing Wounds, Rash and Ulcer. HEENT Present- Seasonal Allergies. Not Present- Earache, Hearing Loss, Hoarseness, Nose Bleed, Oral Ulcers, Ringing in the Ears, Sinus Pain, Sore Throat, Visual Disturbances, Wears glasses/contact lenses and Yellow Eyes. Cardiovascular Not Present- Chest Pain, Difficulty Breathing Lying Down, Leg Cramps, Palpitations, Rapid Heart Rate, Shortness of Breath and Swelling of Extremities. Gastrointestinal Not Present- Abdominal Pain, Bloating, Bloody Stool, Change in Bowel Habits, Chronic diarrhea, Constipation, Difficulty Swallowing, Excessive gas, Gets full quickly at meals, Hemorrhoids, Indigestion, Nausea, Rectal Pain and Vomiting. Male Genitourinary Not Present- Blood in Urine, Change in Urinary Stream, Frequency, Impotence, Nocturia, Painful Urination, Urgency and Urine Leakage. Neurological Not Present- Decreased Memory, Fainting, Headaches, Numbness, Seizures, Tingling, Tremor, Trouble walking and Weakness. Psychiatric Not Present- Anxiety, Bipolar, Change in Sleep Pattern, Depression, Fearful and Frequent crying. Endocrine Not Present- Cold Intolerance, Excessive Hunger, Hair Changes, Heat Intolerance, Hot flashes and New Diabetes. Hematology Not Present- Easy Bruising, Excessive bleeding, Gland problems, HIV and Persistent Infections.  BP 127/84 mmHg  Pulse 61  Temp(Src) 98.3 F (36.8 C) (Oral)  Resp 18  Ht 6' 1.5" (1.867 m)  Wt 108.863 kg (240 lb)  BMI 31.23 kg/m2  SpO2 97%    Physical Exam   General Mental Status-Alert. General Appearance-Not in acute distress. Build & Nutrition-Well nourished. Posture-Normal posture. Gait-Normal.  Head and Neck Head-normocephalic, atraumatic with no lesions or palpable masses. Trachea-midline.  Chest and Lung Exam Chest and lung exam reveals -on auscultation, normal breath sounds, no adventitious sounds and normal vocal resonance.  Cardiovascular Cardiovascular  examination reveals -normal heart sounds, regular rate and rhythm with no murmurs.  Abdomen Inspection Inspection of the abdomen reveals - No Hernias. Palpation/Percussion Palpation and Percussion of the abdomen reveal - Soft,  Non Tender, No Rigidity (guarding), No hepatosplenomegaly and No Palpable abdominal masses.  Neurologic Neurologic evaluation reveals -alert and oriented x 3 with no impairment of recent or remote memory, normal attention span and ability to concentrate, normal sensation and normal coordination.  Musculoskeletal Normal Exam - Bilateral-Upper Extremity Strength Normal and Lower Extremity Strength Normal.    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 It is unable to be endoscopically resected. Biopsies show inflammatory conditions. There is concern for possible obstruction in the near future. I have recommended excision to prevent this from occurring. We will plan on doing a laparoscopic ileocecectomy. This was explained in detail to the patient and all questions were answered. The surgery and anatomy were described to the patient as well as the risks of surgery and the possible complications. These include: Bleeding, deep abdominal infections and possible wound complications such as hernia and infection, damage to adjacent structures, leak of surgical connections, which can lead to other surgeries and possibly an ostomy, possible need for other procedures, such as abscess drains in radiology, possible prolonged hospital stay, possible diarrhea from removal of part of the colon, possible constipation from narcotics, possible bowel, bladder or sexual dysfunction if having rectal surgery, prolonged fatigue/weakness or appetite loss, possible early recurrence of of disease, possible complications of their medical problems such as heart disease or arrhythmias or  lung problems, death (less than 1%). I believe the patient understands and wishes to proceed with the surgery.

## 2015-11-02 NOTE — Anesthesia Postprocedure Evaluation (Signed)
Anesthesia Post Note  Patient: Randy Wilson  Procedure(s) Performed: Procedure(s) (LRB): LAPAROSCOPIC ILEOCECECTOMY (N/A)  Patient location during evaluation: PACU Anesthesia Type: General Level of consciousness: awake and alert Pain management: pain level controlled Vital Signs Assessment: post-procedure vital signs reviewed and stable Respiratory status: spontaneous breathing, nonlabored ventilation, respiratory function stable and patient connected to nasal cannula oxygen Cardiovascular status: blood pressure returned to baseline and stable Postop Assessment: no signs of nausea or vomiting Anesthetic complications: no    Last Vitals:  Filed Vitals:   11/02/15 1545 11/02/15 1600  BP: 134/84 147/79  Pulse: 77 83  Temp:  36.8 C  Resp: 11 13    Last Pain:  Filed Vitals:   11/02/15 1604  PainSc: [employer]

## 2015-11-02 NOTE — Transfer of Care (Signed)
Immediate Anesthesia Transfer of Care Note  Patient: Randy Wilson  Procedure(s) Performed: Procedure(s): LAPAROSCOPIC ILEOCECECTOMY (N/A)  Patient Location: PACU  Anesthesia Type:General  Level of Consciousness:  sedated, patient cooperative and responds to stimulation  Airway & Oxygen Therapy:Patient Spontanous Breathing and Patient connected to face mask oxgen  Post-op Assessment:  Report given to PACU RN and Post -op Vital signs reviewed and stable  Post vital signs:  Reviewed and stable  Last Vitals:  Filed Vitals:   11/02/15 1025  BP: 127/84  Pulse: 61  Temp: 36.8 C  Resp: 18    Complications: No apparent anesthesia complications

## 2015-11-03 LAB — BASIC METABOLIC PANEL
ANION GAP: 8 (ref 5–15)
BUN: 12 mg/dL (ref 6–20)
CHLORIDE: 101 mmol/L (ref 101–111)
CO2: 27 mmol/L (ref 22–32)
CREATININE: 1.07 mg/dL (ref 0.61–1.24)
Calcium: 8.8 mg/dL — ABNORMAL LOW (ref 8.9–10.3)
GFR calc non Af Amer: >60 mL/min (ref >60)
Glucose, Bld: 129 mg/dL — ABNORMAL HIGH (ref 65–99)
Potassium: 3.9 mmol/L (ref 3.5–5.1)
SODIUM: 136 mmol/L (ref 135–145)

## 2015-11-03 LAB — CBC
HCT: 37.9 % — ABNORMAL LOW (ref 39.0–52.0)
HEMOGLOBIN: 13.2 g/dL (ref 13.0–17.0)
MCH: 28.3 pg (ref 26.0–34.0)
MCHC: 34.8 g/dL (ref 30.0–36.0)
MCV: 81.3 fL (ref 78.0–100.0)
PLATELETS: 249 10*3/uL (ref 150–400)
RBC: 4.66 MIL/uL (ref 4.22–5.81)
RDW: 12.9 % (ref 11.5–15.5)
WBC: 10.5 10*3/uL (ref 4.0–10.5)

## 2015-11-03 NOTE — Progress Notes (Signed)
1 Day Post-Op Lap Ileocecectomy Subjective: Had quite a bit of pain overnight.  Somewhat better this am.  No nausea.  Objective: Vital signs in last 24 hours: Temp:  [97.2 F (36.2 C)-99 F (37.2 C)] 98 F (36.7 C) (06/08 0953) Pulse Rate:  [71-88] 71 (06/08 0953) Resp:  [11-20] 16 (06/08 0953) BP: (123-167)/(66-98) 132/79 mmHg (06/08 0953) SpO2:  [94 %-100 %] 97 % (06/08 0953)   Intake/Output from previous day: 06/07 0701 - 06/08 0700 In: 3173.8 [I.V.:3173.8] Out: 2225 [Urine:2175; Blood:50] Intake/Output this shift:     General appearance: alert and cooperative GI: soft, appropriately tender  Incision: no significant drainage, no significant erythema  Lab Results:   Recent Labs  11/03/15 0418  WBC 10.5  HGB 13.2  HCT 37.9*  PLT 249   BMET  Recent Labs  11/03/15 0418  NA 136  K 3.9  CL 101  CO2 27  GLUCOSE 129*  BUN 12  CREATININE 1.07  CALCIUM 8.8*   PT/INR No results for input(s): LABPROT, INR in the last 72 hours. ABG No results for input(s): PHART, HCO3 in the last 72 hours.  Invalid input(s): PCO2, PO2  MEDS, Scheduled . alvimopan  12 mg Oral BID  . antiseptic oral rinse  7 mL Mouth Rinse BID  . enoxaparin (LOVENOX) injection  40 mg Subcutaneous Q24H    Studies/Results: No results found.  Assessment: s/p Procedure(s): LAPAROSCOPIC ILEOCECECTOMY Patient Active Problem List   Diagnosis Date Noted  . 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 Asthma 04/24/2015  . History of colonic polyps 09/17/2014    Expected post op course  Plan: Advance diet to clears Decrease MIV Ambulate and OOB Incentive spirometry   LOS: 1 day     .Rosario Adie, [facility], [place] [phone]   11/03/2015 11:29 AM

## 2015-11-04 LAB — CBC
HCT: 38.5 % — ABNORMAL LOW (ref 39.0–52.0)
HEMOGLOBIN: 13 g/dL (ref 13.0–17.0)
MCH: 28.6 pg (ref 26.0–34.0)
MCHC: 33.8 g/dL (ref 30.0–36.0)
MCV: 84.8 fL (ref 78.0–100.0)
PLATELETS: 236 10*3/uL (ref 150–400)
RBC: 4.54 MIL/uL (ref 4.22–5.81)
RDW: 13.2 % (ref 11.5–15.5)
WBC: 7.5 10*3/uL (ref 4.0–10.5)

## 2015-11-04 LAB — TYPE AND SCREEN
ABO/RH(D): A POS
Antibody Screen: NEGATIVE

## 2015-11-04 LAB — BASIC METABOLIC PANEL
Anion gap: 5 (ref 5–15)
BUN: 12 mg/dL (ref 6–20)
CHLORIDE: 105 mmol/L (ref 101–111)
CO2: 28 mmol/L (ref 22–32)
CREATININE: 1 mg/dL (ref 0.61–1.24)
Calcium: 8.8 mg/dL — ABNORMAL LOW (ref 8.9–10.3)
GFR calc non Af Amer: >60 mL/min (ref >60)
GLUCOSE: 107 mg/dL — AB (ref 65–99)
Potassium: 4.3 mmol/L (ref 3.5–5.1)
Sodium: 138 mmol/L (ref 135–145)

## 2015-11-04 NOTE — Progress Notes (Signed)
2 Days Post-Op Lap Ileocecectomy Subjective: Pain better. Drinking some water.  No nausea.  Good UOP  Objective: Vital signs in last 24 hours: Temp:  [98 F (36.7 C)-99 F (37.2 C)] 99 F (37.2 C) (06/09 0610) Pulse Rate:  [66-71] 70 (06/09 0610) Resp:  [16] 16 (06/09 0610) BP: (129-141)/(76-81) 129/79 mmHg (06/09 0610) SpO2:  [96 %-98 %] 97 % (06/09 0610)   Intake/Output from previous day: 06/08 0701 - 06/09 0700 In: 1499.2 [P.O.:960; I.V.:539.2] Out: 2050 [Urine:2050] Intake/Output this shift:   General appearance: alert and cooperative GI: soft, appropriately tender  Incision: no significant drainage, no significant erythema  Lab Results:   Recent Labs  11/03/15 0418 11/04/15 0349  WBC 10.5 7.5  HGB 13.2 13.0  HCT 37.9* 38.5*  PLT 249 236   BMET  Recent Labs  11/03/15 0418 11/04/15 0349  NA 136 138  K 3.9 4.3  CL 101 105  CO2 27 28  GLUCOSE 129* 107*  BUN 12 12  CREATININE 1.07 1.00  CALCIUM 8.8* 8.8*   PT/INR No results for input(s): LABPROT, INR in the last 72 hours. ABG No results for input(s): PHART, HCO3 in the last 72 hours.  Invalid input(s): PCO2, PO2  MEDS, Scheduled . alvimopan  12 mg Oral BID  . antiseptic oral rinse  7 mL Mouth Rinse BID  . enoxaparin (LOVENOX) injection  40 mg Subcutaneous Q24H    Studies/Results: No results found.  Assessment: s/p Procedure(s): LAPAROSCOPIC ILEOCECECTOMY Patient Active Problem List   Diagnosis Date Noted  . 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 Asthma 04/24/2015  . History of colonic polyps 09/17/2014    Expected post op course  Plan: Cont clears, advance diet to regular once tolerating clears better. Cont MIV Ambulate and OOB Incentive spirometry   LOS: 2 days    .Rosario Adie, [facility], [place]   11/04/2015 7:21 AM

## 2015-11-05 LAB — BASIC METABOLIC PANEL
ANION GAP: 7 (ref 5–15)
BUN: 12 mg/dL (ref 6–20)
CHLORIDE: 103 mmol/L (ref 101–111)
CO2: 26 mmol/L (ref 22–32)
Calcium: 9 mg/dL (ref 8.9–10.3)
Creatinine, Ser: 1.08 mg/dL (ref 0.61–1.24)
GFR calc Af Amer: >60 mL/min (ref >60)
GLUCOSE: 114 mg/dL — AB (ref 65–99)
POTASSIUM: 3.9 mmol/L (ref 3.5–5.1)
Sodium: 136 mmol/L (ref 135–145)

## 2015-11-05 LAB — CBC
HEMATOCRIT: 38.8 % — AB (ref 39.0–52.0)
HEMOGLOBIN: 13.3 g/dL (ref 13.0–17.0)
MCH: 28.8 pg (ref 26.0–34.0)
MCHC: 34.3 g/dL (ref 30.0–36.0)
MCV: 84 fL (ref 78.0–100.0)
Platelets: 255 10*3/uL (ref 150–400)
RBC: 4.62 MIL/uL (ref 4.22–5.81)
RDW: 13 % (ref 11.5–15.5)
WBC: 6.9 10*3/uL (ref 4.0–10.5)

## 2015-11-05 NOTE — Progress Notes (Signed)
[facility] Office:  [phone] General Surgery Progress Note   LOS: 3 days  POD -  3 Days Post-Op  Assessment/Plan: 1  LAPAROSCOPIC ILEOCECECTOMY - 11/04/2015 Marcello Moores  On Entereg  Looks good - not very sore  On Clear liquids  2. DVT prophylaxis - Lovenox   Active Problems:   Benign tumor of ileocecal valve   Subjective:  Doing well.  Minimal discomfort.  Objective:   Filed Vitals:   11/04/15 2120 11/05/15 0500  BP: 135/77 135/82  Pulse: 76 73  Temp: 98.6 F (37 C) 98.2 F (36.8 C)  Resp: 16 16     Intake/Output from previous day:  06/09 0701 - 06/10 0700 In: 2000 [I.V.:2000] Out: 3150 [Urine:2950; Emesis/NG output:200]  Intake/Output this shift:      Physical Exam:   General: WN AA M who is alert and oriented.    HEENT: Normal. Pupils equal. .   Lungs: Clear.  IS - 2,000+cc   Abdomen: Soft. Quiet.   Wound: Clean.   Lab Results:    Recent Labs  11/04/15 0349 11/05/15 0403  WBC 7.5 6.9  HGB 13.0 13.3  HCT 38.5* 38.8*  PLT 236 255    BMET   Recent Labs  11/04/15 0349 11/05/15 0403  NA 138 136  K 4.3 3.9  CL 105 103  CO2 28 26  GLUCOSE 107* 114*  BUN 12 12  CREATININE 1.00 1.08  CALCIUM 8.8* 9.0    PT/INR  No results for input(s): LABPROT, INR in the last 72 hours.  ABG  No results for input(s): PHART, HCO3 in the last 72 hours.  Invalid input(s): PCO2, PO2   Studies/Results:  No results found.   Anti-infectives:   Anti-infectives    Start     Dose/Rate Route Frequency Ordered Stop   11/02/15 2359  cefoTEtan (CEFOTAN) 2 g in dextrose 5 % 50 mL IVPB     2 g 100 mL/hr over 30 Minutes Intravenous Every 12 hours 11/02/15 1648 11/03/15 0047   11/02/15 1022  cefoTEtan (CEFOTAN) 2 g in dextrose 5 % 50 mL IVPB     2 g 100 mL/hr over 30 Minutes Intravenous On call to O.R. 11/02/15 1022 11/02/15 1248      Alphonsa Overall, MD, FACS Pager: [facility] Office: [phone] 11/05/2015

## 2015-11-06 MED ORDER — HYDROCODONE-ACETAMINOPHEN 5-325 MG PO TABS: 1.0000 | ORAL_TABLET | Freq: Four times a day (QID) | ORAL | Status: DC | PRN

## 2015-11-06 NOTE — Discharge Instructions (Signed)
[facility] - DISCHARGE INSTRUCTIONS TO PATIENT  Activity:  Driving - May drive in 3 or 4 days, if doing well and off pain meds   Lifting - No lifting more than 15 pounds for 2 weeks, then no limit  Wound Care:   May shower  Diet:  As tolerated. Eat light for a couple of days  Follow up appointment:  Call Dr. Manon Hilding office [facility]) at [phone] for an appointment in 2 to 3 weeks.  Medications and dosages:  Resume your home medications.  You have a prescription for:  Vicodin  Call Dr. Marcello Moores or his office  [phone]) if you have:  Temperature greater than 100.4,  Persistent nausea and vomiting,  Severe uncontrolled pain,  Redness, tenderness, or signs of infection (pain, swelling, redness, odor or green/yellow discharge around the site),  Difficulty breathing, headache or visual disturbances,  Any other questions or concerns you may have after discharge.  In an emergency, call 911 or go to an Emergency Department at a nearby hospital.

## 2015-11-06 NOTE — Progress Notes (Signed)
Noted pt with low grade temperature 99.4, 99.7. Will follow up with day shift RN to monitor.

## 2015-11-06 NOTE — Discharge Summary (Signed)
Physician Discharge Summary  Patient ID:  Randy Wilson  MRN: [id number]  DOB/AGE: [birth year] 50 y.o.  Admit date: 11/02/2015 Discharge date: 11/06/2015  Discharge Diagnoses:   Active Problems:   Benign tumor of ileocecal valve  (final path pending)  Operation: Procedure(s): LAPAROSCOPIC ILEOCECECTOMY on 11/02/2015 - A. Thomas  Discharged Condition: good  Hospital Course: Randy Wilson is an 50 y.o. male whose primary care physician is Roselee Culver, MD and who was admitted 11/02/2015 with a chief complaint of tumor of ileocecum. He was brought to the operating room on 11/02/2015 and underwent  LAPAROSCOPIC ILEOCECECTOMY.   He has done well post op.  He is tolerating liquids, passing flatus, and having minimal abdominal pain.  He will start some regular food today and, if tolerated, he is ready to go home.  The discharge instructions were reviewed with the patient.  Consults: None  Significant Diagnostic Studies: Results for orders placed or performed during the hospital encounter of 76/73/41  Basic metabolic panel  Result Value Ref Range   Sodium 136 135 - 145 mmol/L   Potassium 3.9 3.5 - 5.1 mmol/L   Chloride 101 101 - 111 mmol/L   CO2 27 22 - 32 mmol/L   Glucose, Bld 129 (H) 65 - 99 mg/dL   BUN 12 6 - 20 mg/dL   Creatinine, Ser 1.07 0.61 - 1.24 mg/dL   Calcium 8.8 (L) 8.9 - 10.3 mg/dL   GFR calc non Af Amer >60 >60 mL/min   GFR calc Af Amer >60 >60 mL/min   Anion gap 8 5 - 15  CBC  Result Value Ref Range   WBC 10.5 4.0 - 10.5 K/uL   RBC 4.66 4.22 - 5.81 MIL/uL   Hemoglobin 13.2 13.0 - 17.0 g/dL   HCT 37.9 (L) 39.0 - 52.0 %   MCV 81.3 78.0 - 100.0 fL   MCH 28.3 26.0 - 34.0 pg   MCHC 34.8 30.0 - 36.0 g/dL   RDW 12.9 11.5 - 15.5 %   Platelets 249 150 - 400 K/uL  Basic metabolic panel  Result Value Ref Range   Sodium 138 135 - 145 mmol/L   Potassium 4.3 3.5 - 5.1 mmol/L   Chloride 105 101 - 111 mmol/L   CO2 28 22 - 32 mmol/L   Glucose, Bld 107 (H) 65 - 99 mg/dL   BUN 12 6 - 20 mg/dL   Creatinine, Ser 1.00 0.61 - 1.24 mg/dL   Calcium 8.8 (L) 8.9 - 10.3 mg/dL   GFR calc non Af Amer >60 >60 mL/min   GFR calc Af Amer >60 >60 mL/min   Anion gap 5 5 - 15  CBC  Result Value Ref Range   WBC 7.5 4.0 - 10.5 K/uL   RBC 4.54 4.22 - 5.81 MIL/uL   Hemoglobin 13.0 13.0 - 17.0 g/dL   HCT 38.5 (L) 39.0 - 52.0 %   MCV 84.8 78.0 - 100.0 fL   MCH 28.6 26.0 - 34.0 pg   MCHC 33.8 30.0 - 36.0 g/dL   RDW 13.2 11.5 - 15.5 %   Platelets 236 150 - 400 K/uL  Basic metabolic panel  Result Value Ref Range   Sodium 136 135 - 145 mmol/L   Potassium 3.9 3.5 - 5.1 mmol/L   Chloride 103 101 - 111 mmol/L   CO2 26 22 - 32 mmol/L   Glucose, Bld 114 (H) 65 - 99 mg/dL   BUN 12 6 - 20 mg/dL   Creatinine, Ser  1.08 0.61 - 1.24 mg/dL   Calcium 9.0 8.9 - 10.3 mg/dL   GFR calc non Af Amer >60 >60 mL/min   GFR calc Af Amer >60 >60 mL/min   Anion gap 7 5 - 15  CBC  Result Value Ref Range   WBC 6.9 4.0 - 10.5 K/uL   RBC 4.62 4.22 - 5.81 MIL/uL   Hemoglobin 13.3 13.0 - 17.0 g/dL   HCT 38.8 (L) 39.0 - 52.0 %   MCV 84.0 78.0 - 100.0 fL   MCH 28.8 26.0 - 34.0 pg   MCHC 34.3 30.0 - 36.0 g/dL   RDW 13.0 11.5 - 15.5 %   Platelets 255 150 - 400 K/uL    No results found.  Discharge Exam:  Filed Vitals:   11/05/15 2220 11/06/15 0443  BP:  141/73  Pulse:  58  Temp: 99.4 F (37.4 C) 99.7 F (37.6 C)  Resp:  16    General: WN AA M who is alert and generally healthy appearing.  Lungs: Clear to auscultation and symmetric breath sounds. Heart:  RRR. No murmur or rub. Abdomen: Soft. No hernia. Normal bowel sounds. His incisions look good. Extremities:  Good strength and ROM  in upper and lower extremities.  Discharge Medications:     Medication List    TAKE these medications        cetirizine 10 MG tablet  Commonly known as:  ZYRTEC  Take 10 mg by mouth every evening.     HYDROcodone-acetaminophen 5-325 MG tablet  Commonly known as:  NORCO/VICODIN  Take 1-2 tablets  by mouth every 6 (six) hours as needed.     mineral oil-hydrophilic petrolatum ointment  Apply 1 application topically daily as needed (Applies to area for tattoo removal.).     tetrahydrozoline 0.05 % ophthalmic solution  Place 1 drop into both eyes 4 (four) times daily as needed (For eye irritation.).     traMADol 50 MG tablet  Commonly known as:  ULTRAM  Take 1 tablet (50 mg total) by mouth every 6 (six) hours as needed.        Disposition: 01-Home or Self Care      Discharge Instructions    Diet - low sodium heart healthy    Complete by:  As directed      Increase activity slowly    Complete by:  As directed             Activity:  Driving - May drive in 3 or 4 days, if doing well and off pain meds   Lifting - No lifting more than 15 pounds for 2 weeks, then no limit  Wound Care:   May shower  Diet:  As tolerated. Eat light for a couple of days  Follow up appointment:  Call Dr. Manon Hilding office [facility]) at [phone] for an appointment in 2 to 3 weeks.  Medications and dosages:  Resume your home medications.  You have a prescription for:  Vicodin   Signed: Alphonsa Overall, M.D., [facility] Office:  [phone]  11/06/2015, 7:52 AM

## 2015-11-06 NOTE — Progress Notes (Signed)
Assessment unchanged. Pt verbalized understanding of dc instructions through teach back including follow up care and when to call the doctor. Script provided by MD. Julienne Kass breakfast and lunch with no problems. Discharged via foot per request to front entrance to meet awaiting vehicle to carry home. Accompanied by NT and family.

## 2015-11-06 NOTE — Progress Notes (Signed)
[facility] Office:  [phone] General Surgery Progress Note   LOS: 4 days  POD -  4 Days Post-Op  Assessment/Plan: 1  LAPAROSCOPIC ILEOCECECTOMY - 11/04/2015 Randy Wilson  On Entereg  Looks good - not very sore  On Clear liquids - to advance to reg diet.  If he does well with diet, will go home this afternoon.  2. DVT prophylaxis - Lovenox   Active Problems:   Benign tumor of ileocecal valve   Subjective:  Doing well.  Passing a lot of flatus, no BM yet. But he is ready to eat more and go home.  Objective:   Filed Vitals:   11/05/15 2220 11/06/15 0443  BP:  141/73  Pulse:  58  Temp: 99.4 F (37.4 C) 99.7 F (37.6 C)  Resp:  16     Intake/Output from previous day:  06/10 0701 - 06/11 0700 In: 1300 [P.O.:900; I.V.:400] Out: 1025 [Urine:1025]  Intake/Output this shift:      Physical Exam:   General: WN AA M who is alert and oriented.    HEENT: Normal. Pupils equal. .   Lungs: Clear.  IS - 2,000+cc   Abdomen: Soft. Quiet.   Wound: Incisions look good.   Lab Results:     Recent Labs  11/04/15 0349 11/05/15 0403  WBC 7.5 6.9  HGB 13.0 13.3  HCT 38.5* 38.8*  PLT 236 255    BMET    Recent Labs  11/04/15 0349 11/05/15 0403  NA 138 136  K 4.3 3.9  CL 105 103  CO2 28 26  GLUCOSE 107* 114*  BUN 12 12  CREATININE 1.00 1.08  CALCIUM 8.8* 9.0    PT/INR  No results for input(s): LABPROT, INR in the last 72 hours.  ABG  No results for input(s): PHART, HCO3 in the last 72 hours.  Invalid input(s): PCO2, PO2   Studies/Results:  No results found.   Anti-infectives:   Anti-infectives    Start     Dose/Rate Route Frequency Ordered Stop   11/02/15 2359  cefoTEtan (CEFOTAN) 2 g in dextrose 5 % 50 mL IVPB     2 g 100 mL/hr over 30 Minutes Intravenous Every 12 hours 11/02/15 1648 11/03/15 0047   11/02/15 1022  cefoTEtan (CEFOTAN) 2 g in dextrose 5 % 50 mL IVPB     2 g 100 mL/hr over 30 Minutes Intravenous On call to O.R. 11/02/15 1022  11/02/15 1248      Alphonsa Overall, MD, FACS Pager: [facility] Office: [phone] 11/06/2015

## 2015-11-11 ENCOUNTER — Telehealth: Payer: Self-pay | Admitting: Gastroenterology

## 2015-11-11 NOTE — Telephone Encounter (Signed)
reviewed the report of surgical specimen forwarded to me by Dr. Marcello Moores.    Please arrange for Randy Wilson to see me in about 4 months to see how he is feeling. Also put him in for a recall colonoscopy a year from now

## 2015-11-11 NOTE — Telephone Encounter (Signed)
Recall ROV and Colon in EPIC as requested per Dr Loletha Carrow

## 2016-01-19 ENCOUNTER — Encounter: Payer: Self-pay | Admitting: Gastroenterology

## 2016-01-19 ENCOUNTER — Ambulatory Visit: Payer: BLUE CROSS/BLUE SHIELD | Admitting: Gastroenterology

## 2016-01-19 ENCOUNTER — Ambulatory Visit (INDEPENDENT_AMBULATORY_CARE_PROVIDER_SITE_OTHER): Payer: BLUE CROSS/BLUE SHIELD | Admitting: Gastroenterology

## 2016-01-19 VITALS — BP 124/80 | HR 68 | Ht 72.75 in | Wt 228.5 lb

## 2016-01-19 DIAGNOSIS — K648 Other hemorrhoids: Secondary | ICD-10-CM | POA: Diagnosis not present

## 2016-01-19 DIAGNOSIS — D120 Benign neoplasm of cecum: Secondary | ICD-10-CM

## 2016-01-19 DIAGNOSIS — D126 Benign neoplasm of colon, unspecified: Secondary | ICD-10-CM | POA: Diagnosis not present

## 2016-01-19 NOTE — Patient Instructions (Addendum)
Colonoscopy is due in June 2018.  If you are age 52 or older, your body mass index should be between 23-30. Your Body mass index is 30.35 kg/m. If this is out of the aforementioned range listed, please consider follow up with your Primary Care Provider.  If you are age 60 or younger, your body mass index should be between 19-25. Your Body mass index is 30.35 kg/m. If this is out of the aformentioned range listed, please consider follow up with your Primary Care Provider.   Thank you for choosing [facility]  Dr Wilfrid Lund III

## 2016-01-19 NOTE — Progress Notes (Signed)
[facility] Progress Note  Chief Complaint: giant inflammatory polyp of ICV  Subjective  History:  Underwent ileocecectomy for large inflammatory polyp.  Now feels well.  Has had 2 episodes of LLQ pain during recovery, otherwise feels well.  Denies rectal bleeding since anal fissure treated.  BMs regular.  Appetite good, no weight loss  ROS: Cardiovascular:  no chest pain Respiratory: no dyspnea  The patient's Past Medical, Family and Social History were reviewed and are on file in the EMR.  Objective:  Med list reviewed  Vital signs in last 24 hrs: Vitals:   01/19/16 0926  BP: 124/80  Pulse: 68    Physical Exam     Cardiac: RRR without murmurs, S1S2 heard, no peripheral edema  Pulm: clear to auscultation bilaterally, normal RR and effort noted  Abdomen: soft, no tenderness, with active bowel sounds. No guarding or palpable hepatosplenomegaly.    Recent Labs:  Pathology report reviewed.  No clear evidence of Crohn's   @ASSESSMENTPLANBEGIN @ Assessment: Encounter Diagnoses  Name Primary?  . Benign tumor of ileocecal valve Yes  . Internal hemorrhoids     Feeling well.  No ongoing symptoms.  Discussed how this could have been indolent , unusual presentation of CD.  Still, no symptoms now.  Plan:  Colonoscopy next June/July and see if any evidence CD.  Call sooner as needed.  Total time 15 minutes, over half spent in counseling and coordination of care.   Nelida Meuse III

## 2016-04-05 ENCOUNTER — Ambulatory Visit (INDEPENDENT_AMBULATORY_CARE_PROVIDER_SITE_OTHER): Payer: BLUE CROSS/BLUE SHIELD

## 2016-04-05 ENCOUNTER — Ambulatory Visit (INDEPENDENT_AMBULATORY_CARE_PROVIDER_SITE_OTHER): Payer: BLUE CROSS/BLUE SHIELD | Admitting: Physician Assistant

## 2016-04-05 VITALS — BP 126/86 | HR 59 | Temp 97.7°F | Resp 16 | Ht 73.0 in | Wt 229.0 lb

## 2016-04-05 DIAGNOSIS — I700 Atherosclerosis of aorta: Secondary | ICD-10-CM | POA: Diagnosis not present

## 2016-04-05 DIAGNOSIS — R0789 Other chest pain: Secondary | ICD-10-CM

## 2016-04-05 MED ORDER — MELOXICAM 15 MG PO TABS: 15.0000 mg | ORAL_TABLET | Freq: Every day | ORAL | 0 refills | Status: DC

## 2016-04-05 MED ORDER — CYCLOBENZAPRINE HCL 5 MG PO TABS: 5.0000 mg | ORAL_TABLET | Freq: Three times a day (TID) | ORAL | 0 refills | Status: DC | PRN

## 2016-04-05 NOTE — Progress Notes (Signed)
Randy Wilson  MRN: [id number] DOB: 1966/01/05  Subjective:  Randy Wilson is a 50 y.o. male seen in office today for a chief complaint of left sided dull chest pain x 4 days.Notes that it comes and goes and will last for about 45 seconds. Typically appears when he is winding down for the day,especially when he is lying down.  Denies SOB, nausea, diaphoresis, cough, palpitations. Pt walks about 6 miles a day and never has exertional chest pain. Pt does note that he was lifting his daughter up about a week ago and he twisted in an awkward position. No recent illness, hospitalization, lower leg swelling or pain, malignancy, or travel.  He has not tried anything for relief. Pt not currently having any symptoms in office.   Pt is a former smoker. Quit in 2007. Smoked 1 ppd x 25 years prior to this. Pt drinks about a 6 pack of beer a week.   He has no history of heart disease in the family.   Review of Systems  Constitutional: Negative for chills and fever.  HENT: Negative for congestion.   Respiratory: Negative for wheezing.     Patient Active Problem List   Diagnosis Date Noted  . 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 Asthma 04/24/2015  . History of colonic polyps 09/17/2014    Current Outpatient Prescriptions on File Prior to Visit  Medication Sig Dispense Refill  . mineral oil-hydrophilic petrolatum (AQUAPHOR) ointment Apply 1 application topically daily as needed (Applies to area for tattoo removal.).     No current facility-administered medications on file prior to visit.     Allergies  Allergen Reactions  . Tramadol Itching  . Shrimp [Shellfish Allergy] Hives      Social History   Social History  . Marital status: Single    Spouse name: N/A  . Number of children: N/A  . Years of education: N/A   Occupational History  . Not on file.   Social History Main Topics  . Smoking status: Former Smoker    Packs/day: 1.00    Years: 25.00    Types: Cigarettes    Quit  date: 01/21/2006  . Smokeless tobacco: Never Used  . Alcohol use 3.6 oz/week    6 Cans of beer per week     Comment: OCC  . Drug use: No  . Sexual activity: Not on file   Other Topics Concern  . Not on file   Social History Narrative  . No narrative on file    Objective:  BP 126/86 (BP Location: Right Arm, Cuff Size: Large)   Pulse (!) 59   Temp 97.7 F (36.5 C) (Oral)   Resp 16   Ht 6' 1"  (1.854 m)   Wt 229 lb (103.9 kg)   SpO2 96%   BMI 30.21 kg/m   Physical Exam  Constitutional: He is oriented to person, place, and time and well-developed, well-nourished, and in no distress.  HENT:  Head: Normocephalic and atraumatic.  Eyes: Conjunctivae are normal.  Neck: Trachea normal and normal range of motion.  Cardiovascular: Regular rhythm, normal heart sounds and intact distal pulses.  Bradycardia present.   Pulmonary/Chest: Effort normal and breath sounds normal.    Musculoskeletal:       Right lower leg: He exhibits no tenderness and no swelling.       Left lower leg: He exhibits no tenderness and no swelling.  Neurological: He is alert and oriented to person, place, and time.  Gait normal.  Skin: Skin is warm and dry.  Psychiatric: Affect normal.  Vitals reviewed.  EKG shows sinus rhythm at 55 bpm with no acute ST or T wave changes. EKG findings comparable to prior EKG on 09/23/13. EKG findings presented to Dr. Everlene Farrier.   Dg Chest 2 View  Result Date: 04/05/2016 CLINICAL DATA:  Chest pain for 5 days EXAM: CHEST  2 VIEW COMPARISON:  September 23, 2013 FINDINGS: There is no edema or consolidation. The heart size and pulmonary vascularity are normal. No adenopathy. No bone lesions. There is atherosclerotic calcification in the aorta. IMPRESSION: Aortic atherosclerosis.  No edema or consolidation. Electronically Signed   By: Lowella Grip III M.D.   On: 04/05/2016 09:53   Assessment and Plan :  1. Other chest pain -Likely musculoskeletal etiology as pain is reproducible on  exam. Will treat as muscle strain. Use heat to affected area. NSAIDs and muscle relaxer for inflammation/pain. Begin stretching once pain resolves.  - EKG 12-Lead - DG Chest 2 View; Future - meloxicam (MOBIC) 15 MG tablet; Take 1 tablet (15 mg total) by mouth daily.  Dispense: 14 tablet; Refill: 0 - cyclobenzaprine (FLEXERIL) 5 MG tablet; Take 1 tablet (5 mg total) by mouth 3 (three) times daily as needed for muscle spasms.  Dispense: 60 tablet; Refill: 0 -Return in one week if no improvement in symptoms.   2. Aortic atherosclerosis (HCC) -Incidental finding on CXR, will refer to cardiology for further evaluation as patient is 50 years old and is a former smoker with a 25 pack year history.  - Ambulatory referral to Cardiology  Tenna Delaine PA-C  [facility] 04/05/2016 9:31 AM

## 2016-04-05 NOTE — Patient Instructions (Addendum)
  Pt to schedule an appointment with me in December on a Wednesday in [address] for annual physical exam.    For chest discomfort, take meloxicam daily and use flexeril as needed for spasms. Use heat to the affected area 4-5 times a day until pain subsides. Start doing torso exercises once your pain goes away to help strengthen those muscles.    Follow up if no improvement with chest musculoskeletal pain in one week.    IF you received an x-ray today, you will receive an invoice from [facility]. Please contact [facility] at [phone] with questions or concerns regarding your invoice.   IF you received labwork today, you will receive an invoice from [employer]. Please contact [facility] at [phone] with questions or concerns regarding your invoice.   Our billing staff will not be able to assist you with questions regarding bills from these companies.  You will be contacted with the lab results as soon as they are available. The fastest way to get your results is to activate your My Chart account. Instructions are located on the last page of this paperwork. If you have not heard from [place] regarding the results in 2 weeks, please contact this office.

## 2016-05-02 ENCOUNTER — Encounter: Payer: Self-pay | Admitting: Physician Assistant

## 2016-05-02 ENCOUNTER — Ambulatory Visit (INDEPENDENT_AMBULATORY_CARE_PROVIDER_SITE_OTHER): Payer: BLUE CROSS/BLUE SHIELD | Admitting: Physician Assistant

## 2016-05-02 VITALS — BP 110/74 | HR 64 | Temp 98.3°F | Resp 16 | Ht 73.0 in | Wt 233.8 lb

## 2016-05-02 DIAGNOSIS — Z87898 Personal history of other specified conditions: Secondary | ICD-10-CM

## 2016-05-02 DIAGNOSIS — Z13228 Encounter for screening for other metabolic disorders: Secondary | ICD-10-CM

## 2016-05-02 DIAGNOSIS — Z1329 Encounter for screening for other suspected endocrine disorder: Secondary | ICD-10-CM | POA: Diagnosis not present

## 2016-05-02 DIAGNOSIS — Z23 Encounter for immunization: Secondary | ICD-10-CM

## 2016-05-02 DIAGNOSIS — Z0000 Encounter for general adult medical examination without abnormal findings: Secondary | ICD-10-CM

## 2016-05-02 DIAGNOSIS — Z13220 Encounter for screening for lipoid disorders: Secondary | ICD-10-CM | POA: Diagnosis not present

## 2016-05-02 DIAGNOSIS — Z130 Encounter for screening for diseases of the blood and blood-forming organs and certain disorders involving the immune mechanism: Secondary | ICD-10-CM

## 2016-05-02 MED ORDER — FLUTICASONE PROPIONATE 50 MCG/ACT NA SUSP
2.0000 | Freq: Every day | NASAL | 6 refills | Status: DC
Start: 2016-05-02 — End: 2017-03-07

## 2016-05-02 NOTE — Patient Instructions (Addendum)
We will contact you with your lab results.  Keep up the good work! Limit your sweets, increase your fruits, and keep that positive attitude! Thank you for letting me participate in your health and well being!!!   IF you received an x-ray today, you will receive an invoice from [facility]. Please contact [facility] at [phone] with questions or concerns regarding your invoice.   IF you received labwork today, you will receive an invoice from [employer]. Please contact [facility] at [phone] with questions or concerns regarding your invoice.   Our billing staff will not be able to assist you with questions regarding bills from these companies.  You will be contacted with the lab results as soon as they are available. The fastest way to get your results is to activate your My Chart account. Instructions are located on the last page of this paperwork. If you have not heard from [place] regarding the results in 2 weeks, please contact this office.

## 2016-05-02 NOTE — Progress Notes (Signed)
Randy Wilson  MRN: [id number] DOB: June 27, 1965  Subjective:  Pt is a 50 y.o. male who presents for annual physical exam.  Diet: Pt notes he has changed his diet a whole lot. He has decreased his salt intake. Started to eat more baked and grilled meat. Likes vegetables a lot but does not eat a lot of fruit. They have cut down eating out, he is only going 2-3 times a week now. He does like to eat sweets a lot.  Drinks water and ginger ale. He does not take a multivitamin.  Exercise: He walks 6 miles a day. He lifts weights 3 times a week.   Social: Pt grew up in [place]. He is married with three children (all girls). 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 He has a really good friend group here.   Bowel movements: Has a bowel movement once a day. Notes they are regular.   Prediabetes:He has been eating more sweets recently because he did not realize he was prediabetic. His last A1c was 6 months ago and it was 5.9.    Last annual exam: 6-7 years ago.  Last dental exam: 08/2015 Last vision exam: 06/2015 Last colonoscopy: 09/07/2014 Vaccinations      Tetanus: >10 years ago      Pt is not fasting today. His last meal was a hamburger at 11am Patient Active Problem List   Diagnosis Date Noted  . 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 Asthma 04/24/2015  . Hx of colonic polyp 11/08/2014  . History of colonic polyps 09/17/2014    Current Outpatient Prescriptions on File Prior to Visit  Medication Sig Dispense Refill  . mineral oil-hydrophilic petrolatum (AQUAPHOR) ointment Apply 1 application topically daily as needed (Applies to area for tattoo removal.).     No current facility-administered medications on file prior to visit.     Allergies  Allergen Reactions  . Tramadol Itching  . Shrimp [Shellfish Allergy] Hives    Social History   Social History  . Marital status: Single    Spouse name: N/A  . Number of children: N/A  . Years of education: N/A   Social  History Main Topics  . Smoking status: Former Smoker    Packs/day: 1.00    Years: 25.00    Types: Cigarettes    Quit date: 01/21/2006  . Smokeless tobacco: Never Used  . Alcohol use 3.6 oz/week    6 Cans of beer per week     Comment: OCC  . Drug use: No  . Sexual activity: Not Asked   Other Topics Concern  . None   Social History Narrative  . None    Past Surgical History:  Procedure Laterality Date  . ANKLE SURGERY Left 2002   bone spurs removed  . COLONSCOPY     X 4  . LAPAROSCOPIC ILEOCECECTOMY N/A 11/02/2015   Procedure: LAPAROSCOPIC ILEOCECECTOMY;  Surgeon: Leighton Ruff, MD;  Location: [facility];  Service: General;  Laterality: N/A;  . SHOULDER SURGERY Left 2010   bone spurs removed     Family History  Problem Relation Age of Onset  . Asthma Mother 29    asthma attack   . Colon cancer Neg Hx   . Stomach cancer Neg Hx   . Rectal cancer Neg Hx   . Esophageal cancer Neg Hx     Review of Systems  Constitutional: Negative.   HENT: Negative.   Eyes: Negative.   Respiratory: Negative.  Cardiovascular: Negative.   Gastrointestinal: Negative.   Endocrine: Negative.   Genitourinary: Negative.   Musculoskeletal: Negative.   Skin: Negative.   Allergic/Immunologic: Negative.   Neurological: Negative.   Hematological: Negative.   Psychiatric/Behavioral: Negative.     Objective:  BP 110/74 (BP Location: Right Arm, Patient Position: Sitting, Cuff Size: Large)   Pulse 64   Temp 98.3 F (36.8 C) (Oral)   Resp 16   Ht _0  (1.854 m)   Wt 233 lb 12.8 oz (106.1 kg)   SpO2 98%   BMI 30.85 kg/m   Physical Exam  Constitutional: He is oriented to person, place, and time and well-developed, well-nourished, and in no distress.  HENT:  Head: Normocephalic and atraumatic.  Right Ear: Hearing, tympanic membrane, external ear and ear canal normal.  Left Ear: Hearing, tympanic membrane, external ear and ear canal normal.  Nose: Mucosal edema ( more prominent on right)  present.  Mouth/Throat: Uvula is midline and mucous membranes are normal. Posterior oropharyngeal erythema present. No oropharyngeal exudate.  Eyes: Conjunctivae and EOM are normal. Pupils are equal, round, and reactive to light.  Neck: Trachea normal and normal range of motion.  Cardiovascular: Normal rate, regular rhythm, normal heart sounds and intact distal pulses.   Pulmonary/Chest: Effort normal and breath sounds normal.  Abdominal: Soft. Normal appearance and bowel sounds are normal.  Genitourinary:  Genitourinary Comments: Rectal exam deferred.    Musculoskeletal: Normal range of motion.  Lymphadenopathy:       Head (right side): No submental, no submandibular, no tonsillar, no preauricular, no posterior auricular and no occipital adenopathy present.       Head (left side): No submental, no submandibular, no tonsillar, no preauricular, no posterior auricular and no occipital adenopathy present.    He has no cervical adenopathy.       Right: No supraclavicular adenopathy present.       Left: No supraclavicular adenopathy present.  Neurological: He is alert and oriented to person, place, and time. He has normal sensation, normal strength and normal reflexes. Gait normal.  Skin: Skin is warm and dry.  Psychiatric: Affect normal.  Vitals reviewed.  No exam data present  Assessment and Plan :  Discussed healthy lifestyle, diet, exercise, preventative care, vaccinations, and addressed patient's concerns. Plan for follow up in one year. Otherwise, plan for specific conditions below.  1. Annual physical exam Await lab results   2. Need for prophylactic vaccination and inoculation against influenza - Flu Vaccine QUAD 36+ mos PF IM (Fluarix & Fluzone Quad PF)  3. Screening for metabolic disorder - [id number]  4. Screening for lipid disorders - Lipid panel  5. Screening for thyroid disorder - TSH  6. Screening, anemia, deficiency, iron - CBC with Differential/Platelet  7.  History of prediabetes - Hemoglobin A1c  Tenna Delaine PA-C  [facility] 05/02/2016 5:32 PM

## 2016-05-03 LAB — CBC WITH DIFFERENTIAL/PLATELET
BASOS: 0 %
Basophils Absolute: 0 10*3/uL (ref 0.0–0.2)
EOS (ABSOLUTE): 0.4 10*3/uL (ref 0.0–0.4)
EOS: 5 %
HEMATOCRIT: 42.8 % (ref 37.5–51.0)
Hemoglobin: 14.7 g/dL (ref 13.0–17.7)
IMMATURE GRANS (ABS): 0 10*3/uL (ref 0.0–0.1)
IMMATURE GRANULOCYTES: 1 %
Lymphocytes Absolute: 2.5 10*3/uL (ref 0.7–3.1)
Lymphs: 34 %
MCH: 28.7 pg (ref 26.6–33.0)
MCHC: 34.3 g/dL (ref 31.5–35.7)
MCV: 83 fL (ref 79–97)
MONOS ABS: 0.8 10*3/uL (ref 0.1–0.9)
Monocytes: 11 %
NEUTROS ABS: 3.6 10*3/uL (ref 1.4–7.0)
Neutrophils: 49 %
PLATELETS: 271 10*3/uL (ref 150–379)
RBC: 5.13 x10E6/uL (ref 4.14–5.80)
RDW: 14.4 % (ref 12.3–15.4)
WBC: 7.2 10*3/uL (ref 3.4–10.8)

## 2016-05-03 LAB — CMP14+EGFR
ALT: 37 IU/L (ref 0–44)
AST: 37 IU/L (ref 0–40)
Albumin/Globulin Ratio: 1.5 (ref 1.2–2.2)
Albumin: 4.8 g/dL (ref 3.5–5.5)
Alkaline Phosphatase: 78 IU/L (ref 39–117)
BILIRUBIN TOTAL: 0.5 mg/dL (ref 0.0–1.2)
BUN/Creatinine Ratio: 19 (ref 9–20)
BUN: 19 mg/dL (ref 6–24)
CALCIUM: 9.5 mg/dL (ref 8.7–10.2)
CHLORIDE: 102 mmol/L (ref 96–106)
CO2: 23 mmol/L (ref 18–29)
Creatinine, Ser: 0.99 mg/dL (ref 0.76–1.27)
GFR, EST AFRICAN AMERICAN: 101 mL/min/{1.73_m2} (ref >59)
GFR, EST NON AFRICAN AMERICAN: 87 mL/min/{1.73_m2} (ref >59)
GLOBULIN, TOTAL: 3.2 g/dL (ref 1.5–4.5)
Glucose: 88 mg/dL (ref 65–99)
POTASSIUM: 4.3 mmol/L (ref 3.5–5.2)
SODIUM: 141 mmol/L (ref 134–144)
TOTAL PROTEIN: 8 g/dL (ref 6.0–8.5)

## 2016-05-03 LAB — HEMOGLOBIN A1C
ESTIMATED AVERAGE GLUCOSE: 111 mg/dL
HEMOGLOBIN A1C: 5.5 % (ref 4.8–5.6)

## 2016-05-03 LAB — LIPID PANEL
CHOL/HDL RATIO: 3.5 ratio (ref 0.0–5.0)
Cholesterol, Total: 191 mg/dL (ref 100–199)
HDL: 54 mg/dL (ref >39)
LDL Calculated: 119 mg/dL — ABNORMAL HIGH (ref 0–99)
Triglycerides: 92 mg/dL (ref 0–149)
VLDL Cholesterol Cal: 18 mg/dL (ref 5–40)

## 2016-05-03 LAB — TSH: TSH: 1.86 u[IU]/mL (ref 0.450–4.500)

## 2016-05-05 ENCOUNTER — Encounter: Payer: Self-pay | Admitting: *Deleted

## 2016-05-09 ENCOUNTER — Other Ambulatory Visit: Payer: Self-pay | Admitting: Physician Assistant

## 2016-05-09 DIAGNOSIS — Z87891 Personal history of nicotine dependence: Secondary | ICD-10-CM

## 2016-06-08 NOTE — Progress Notes (Signed)
Referring: Tenna Delaine   HPI: 51 yo male for evaluation of chest pain. Chest xray 11/17 showed aortic atherosclerosis and no edema. Labs 12/17 showed normal renal function, LFTs, Hgb; LDL 119. Over the past 2 months he has had occasional pain in the left breast area. It is described as a sharp pain lasting 1-2 seconds. He notes it with walking at times. No radiation or associated symptoms. No other exertional chest pain. No dyspnea on exertion, orthopnea, PND, pedal edema, exertional chest pain or syncope. Because of the above we were asked to evaluate.  Current Outpatient Prescriptions  Medication Sig Dispense Refill  . fluticasone (FLONASE) 50 MCG/ACT nasal spray Place 2 sprays into both nostrils daily. 16 g 6  . mineral oil-hydrophilic petrolatum (AQUAPHOR) ointment Apply 1 application topically daily as needed (Applies to area for tattoo removal.).     No current facility-administered medications for this visit.     Allergies  Allergen Reactions  . Tramadol Itching  . Shrimp [Shellfish Allergy] Hives     Past Medical History:  Diagnosis Date  . Allergy   . Asthma AS CHILD   NONE NOW  . Chronic enteritis   . Hemorrhoids   . Inflammatory polyps of colon ([place])   . Rectal bleeding NONE IN LAST 3 MONTHS  . Shoulder pain    RIGHT    Past Surgical History:  Procedure Laterality Date  . ANKLE SURGERY Left 2002   bone spurs removed  . COLONSCOPY     X 4  . LAPAROSCOPIC ILEOCECECTOMY N/A 11/02/2015   Procedure: LAPAROSCOPIC ILEOCECECTOMY;  Surgeon: Leighton Ruff, MD;  Location: [facility];  Service: General;  Laterality: N/A;  . SHOULDER SURGERY Left 2010   bone spurs removed     Social History   Social History  . Marital status: Married    Spouse name: N/A  . Number of children: 3  . Years of education: N/A   Occupational History  . Not on file.   Social History Main Topics  . Smoking status: Former Smoker    Packs/day: 1.00    Years: 25.00    Types:  Cigarettes    Quit date: 01/21/2006  . Smokeless tobacco: Never Used  . Alcohol use 3.6 oz/week    6 Cans of beer per week     Comment: OCC  . Drug use: Yes    Types: Marijuana  . Sexual activity: Yes     Comment: with monogamous wife, she takes OCP   Other Topics Concern  . Not on file   Social History Narrative  . No narrative on file    Family History  Problem Relation Age of Onset  . Asthma Mother 43    asthma attack   . Stroke Brother     Myocardial infarction  . Stroke Brother   . Colon cancer Neg Hx   . Stomach cancer Neg Hx   . Rectal cancer Neg Hx   . Esophageal cancer Neg Hx     ROS: no fevers or chills, productive cough, hemoptysis, dysphasia, odynophagia, melena, hematochezia, dysuria, hematuria, rash, seizure activity, orthopnea, PND, pedal edema, claudication. Remaining systems are negative.  Physical Exam:   Blood pressure 120/90, pulse 68, height 6' 1"  (1.854 m), weight 242 lb (109.8 kg).  General:  Well developed/well nourished in NAD Skin warm/dry Patient not depressed No peripheral clubbing Back-normal HEENT-normal/normal eyelids Neck supple/normal carotid upstroke bilaterally; no bruits; no JVD; no thyromegaly chest - CTA/ normal expansion CV -  RRR/normal S1 and S2; no murmurs, rubs or gallops;  PMI nondisplaced Abdomen -NT/ND, no HSM, no mass, + bowel sounds, no bruit 2+ femoral pulses, no bruits Ext-no edema, chords, 2+ DP Neuro-grossly nonfocal  ECG 04/05/16-sinus bradycardia, no ST changes Today's electrocardiogram shows sinus rhythm with no ST changes.  A/P  1 chest pain-symptoms are extremely atypical. Likely not cardiac. His echocardiogram shows no ST changes. We discussed the possibility of an exercise treadmill today. He would like to defer for now. If he has more symptoms he will contact as and we will consider functional study at that point.  2 History of colon polyps-no recent symptoms. Management per primary care.   Kirk Ruths, MD

## 2016-06-18 ENCOUNTER — Encounter: Payer: Self-pay | Admitting: Cardiology

## 2016-06-18 ENCOUNTER — Ambulatory Visit (INDEPENDENT_AMBULATORY_CARE_PROVIDER_SITE_OTHER): Payer: BLUE CROSS/BLUE SHIELD | Admitting: Cardiology

## 2016-06-18 VITALS — BP 120/90 | HR 68 | Ht 73.0 in | Wt 242.0 lb

## 2016-06-18 DIAGNOSIS — R072 Precordial pain: Secondary | ICD-10-CM | POA: Diagnosis not present

## 2016-06-18 NOTE — Patient Instructions (Signed)
Your physician recommends that you schedule a follow-up appointment in: AS NEEDED  

## 2016-06-19 ENCOUNTER — Ambulatory Visit (INDEPENDENT_AMBULATORY_CARE_PROVIDER_SITE_OTHER): Payer: BLUE CROSS/BLUE SHIELD | Admitting: Physician Assistant

## 2016-06-19 VITALS — BP 128/80 | HR 58 | Temp 97.9°F | Resp 17 | Ht 74.5 in | Wt 242.0 lb

## 2016-06-19 DIAGNOSIS — M25561 Pain in right knee: Secondary | ICD-10-CM | POA: Diagnosis not present

## 2016-06-19 MED ORDER — MELOXICAM 15 MG PO TABS: 15.0000 mg | ORAL_TABLET | Freq: Every day | ORAL | 1 refills | Status: DC

## 2016-06-19 NOTE — Progress Notes (Signed)
   [name]  MRN: [id number] DOB: Dec 19, 1965  Subjective:  Randy Wilson is a 51 y.o. male seen in office today for a chief complaint of right knee pain x 2 weeks. Had associated swelling and tightness. Denies acute injury, warmth, redness, and loss of ROM. 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 States he tried ice with moderate relief. States the pain is actually not even present today.    Review of Systems  Constitutional: Negative for chills, diaphoresis and fever.  Musculoskeletal: Negative for gait problem.  Neurological: Negative for weakness and numbness.     Patient Active Problem List   Diagnosis Date Noted  . 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 Asthma 04/24/2015  . Hx of colonic polyp 11/08/2014  . History of colonic polyps 09/17/2014    Current Outpatient Prescriptions on File Prior to Visit  Medication Sig Dispense Refill  . fluticasone (FLONASE) 50 MCG/ACT nasal spray Place 2 sprays into both nostrils daily. 16 g 6   No current facility-administered medications on file prior to visit.     Allergies  Allergen Reactions  . Tramadol Itching  . Shrimp [Shellfish Allergy] Hives     Objective:  BP 128/80 (BP Location: Right Arm, Patient Position: Sitting, Cuff Size: Normal)   Pulse (!) 58   Temp 97.9 F (36.6 C)   Resp 17   Ht 6' 2.5" (1.892 m)   Wt 242 lb (109.8 kg)   SpO2 97%   BMI 30.66 kg/m   Physical Exam  Constitutional: He is oriented to person, place, and time and well-developed, well-nourished, and in no distress.  HENT:  Head: Normocephalic and atraumatic.  Eyes: Conjunctivae are normal.  Neck: Normal range of motion.  Pulmonary/Chest: Effort normal.  Musculoskeletal:       Right knee: He exhibits swelling (mild in suprapatellar region). He exhibits normal range of motion, no ecchymosis, no erythema and normal patellar mobility. No tenderness found.       Left knee: Normal.    Negative anterior/posterior drawer, McMurray, and valgus and varus test of right knee.   Neurological: He is alert and oriented to person, place, and time. Gait normal.  Skin: Skin is warm and dry.  Psychiatric: Affect normal.  Vitals reviewed.   Assessment and Plan :  1. Acute pain of right knee -Resolved today. Physical exam reassuring. Pt does exhibit some minor swelling in the right knee, which could be due to overuse injury. ACE bandage applied today in office. Instructed to use ice for the next week and to use NSAIDs daily for one week. Advance exercise as tolerated. Return to clinic if symptoms return, worsen, or as needed - meloxicam (MOBIC) 15 MG tablet; Take 1 tablet (15 mg total) by mouth daily.  Dispense: 30 tablet; Refill: [place] PA-C  [facility] 06/19/2016 9:40 AM

## 2016-06-19 NOTE — Patient Instructions (Addendum)
Start taking meloxicam daily for the next week. Use ice to affected area for 30 minutes at a time 4-5 times a day. Use the compression wrap over the next week while you are on your feet. Start doing exercise gradually. If pain persists, or returns to a more severe degree, come back and see me immediately. Keep your head up with your kids! You can do it!    IF you received an x-ray today, you will receive an invoice from [facility]. Please contact [facility] at [phone] with questions or concerns regarding your invoice.   IF you received labwork today, you will receive an invoice from [facility]. Please contact [facility] at [phone] with questions or concerns regarding your invoice.   Our billing staff will not be able to assist you with questions regarding bills from these companies.  You will be contacted with the lab results as soon as they are available. The fastest way to get your results is to activate your My Chart account. Instructions are located on the last page of this paperwork. If you have not heard from [place] regarding the results in 2 weeks, please contact this office.      Knee Effusion Introduction Knee effusion means that you have extra fluid in your knee. This can cause pain. Your knee may be more difficult to bend and move. Follow these instructions at home:  Use crutches as told by your doctor.  Wear a knee brace as told by your doctor.  Apply ice to the swollen area:  Put ice in a plastic bag.  Place a towel between your skin and the bag.  Leave the ice on for 20 minutes, 2-3 times per day.  Keep your knee raised (elevated) when you are sitting or lying down.  Take medicines only as told by your doctor.  Do any rehabilitation or strengthening exercises as told by your doctor.  Rest your knee as told by your doctor. You may start doing your normal activities again when your doctor says it is okay.  Keep all follow-up visits as told by  your doctor. This is important. Contact a doctor if:  You continue to have pain in your knee. Get help right away if:  You have increased swelling or redness of your knee.  You have severe pain in your knee.  You have a fever. This information is not intended to replace advice given to you by your health care provider. Make sure you discuss any questions you have with your health care provider. Document Released: 06/16/2010 Document Revised: 10/20/2015 Document Reviewed: 12/28/2013  2017 [employer]

## 2016-09-11 ENCOUNTER — Encounter: Payer: Self-pay | Admitting: Gastroenterology

## 2016-09-13 ENCOUNTER — Ambulatory Visit (INDEPENDENT_AMBULATORY_CARE_PROVIDER_SITE_OTHER): Payer: BLUE CROSS/BLUE SHIELD | Admitting: Physician Assistant

## 2016-09-13 VITALS — BP 132/86 | HR 60 | Temp 98.2°F | Resp 16 | Ht 74.5 in | Wt 239.8 lb

## 2016-09-13 DIAGNOSIS — R42 Dizziness and giddiness: Secondary | ICD-10-CM

## 2016-09-13 LAB — POCT URINALYSIS DIP (MANUAL ENTRY)
BILIRUBIN UA: NEGATIVE
Glucose, UA: NEGATIVE mg/dL
Ketones, POC UA: NEGATIVE mg/dL
LEUKOCYTES UA: NEGATIVE
NITRITE UA: NEGATIVE
PROTEIN UA: NEGATIVE mg/dL
Spec Grav, UA: 1.015 (ref 1.010–1.025)
Urobilinogen, UA: 0.2 E.U./dL
pH, UA: 6 (ref 5.0–8.0)

## 2016-09-13 LAB — POCT CBC
Granulocyte percent: 62.9 %G (ref 37–80)
HEMATOCRIT: 41.5 % — AB (ref 43.5–53.7)
Hemoglobin: 13.9 g/dL — AB (ref 14.1–18.1)
LYMPH, POC: 1.7 (ref 0.6–3.4)
MCH, POC: 29 pg (ref 27–31.2)
MCHC: 33.5 g/dL (ref 31.8–35.4)
MCV: 86.5 fL (ref 80–97)
MID (cbc): 0.5 (ref 0–0.9)
MPV: 7.6 fL (ref 0–99.8)
POC GRANULOCYTE: 3.7 (ref 2–6.9)
POC LYMPH %: 28.5 % (ref 10–50)
POC MID %: 8.6 % (ref 0–12)
Platelet Count, POC: 242 10*3/uL (ref 142–424)
RBC: 4.79 M/uL (ref 4.69–6.13)
RDW, POC: 13.2 %
WBC: 5.9 10*3/uL (ref 4.6–10.2)

## 2016-09-13 LAB — POC MICROSCOPIC URINALYSIS (UMFC): MUCUS RE: ABSENT

## 2016-09-13 MED ORDER — MECLIZINE HCL 32 MG PO TABS: 32.0000 mg | ORAL_TABLET | Freq: Three times a day (TID) | ORAL | 0 refills | Status: DC | PRN

## 2016-09-13 NOTE — Patient Instructions (Addendum)
Your physical exam findings and point of care labs are reassuring. This is likely BPPV, I have given you information on this below. If you ever have this again I have also given you info on how to perform the maneuver we did today in office. You can always come back here if that does not work. I have given you a prescription for medication to use for dizziness if you need it. If your symptoms persist or worsen, please come back.   In terms of elevated blood pressure, I would like you to check your blood pressure at least a couple times over the next week outside of the office and document these values. It is best if you check the blood pressure at different times in the day. Your goal is <140/90. If your values are consistently above this goal, please return to office for further evaluation. If you start to have chest pain, blurred vision, shortness of breath, severe headache, lower leg swelling, or nausea/vomiting please seek care immediately here or at the ED.    Benign Positional Vertigo Vertigo is the feeling that you or your surroundings are moving when they are not. Benign positional vertigo is the most common form of vertigo. The cause of this condition is not serious (is benign). This condition is triggered by certain movements and positions (is positional). This condition can be dangerous if it occurs while you are doing something that could endanger you or others, such as driving. What are the causes? In many cases, the cause of this condition is not known. It may be caused by a disturbance in an area of the inner ear that helps your brain to sense movement and balance. This disturbance can be caused by a viral infection (labyrinthitis), head injury, or repetitive motion. What increases the risk? This condition is more likely to develop in:  Women.  People who are 68 years of age or older. What are the signs or symptoms? Symptoms of this condition usually happen when you move your head or  your eyes in different directions. Symptoms may start suddenly, and they usually last for less than a minute. Symptoms may include:  Loss of balance and falling.  Feeling like you are spinning or moving.  Feeling like your surroundings are spinning or moving.  Nausea and vomiting.  Blurred vision.  Dizziness.  Involuntary eye movement (nystagmus). Symptoms can be mild and cause only slight annoyance, or they can be severe and interfere with daily life. Episodes of benign positional vertigo may return (recur) over time, and they may be triggered by certain movements. Symptoms may improve over time. How is this diagnosed? This condition is usually diagnosed by medical history and a physical exam of the head, neck, and ears. You may be referred to a health care provider who specializes in ear, nose, and throat (ENT) problems (otolaryngologist) or a provider who specializes in disorders of the nervous system (neurologist). You may have additional testing, including:  MRI.  A CT scan.  Eye movement tests. Your health care provider may ask you to change positions quickly while he or she watches you for symptoms of benign positional vertigo, such as nystagmus. Eye movement may be tested with an electronystagmogram (ENG), caloric stimulation, the Dix-Hallpike test, or the roll test.  An electroencephalogram (EEG). This records electrical activity in your brain.  Hearing tests. How is this treated? Usually, your health care provider will treat this by moving your head in specific positions to adjust your inner ear back  to normal. Surgery may be needed in severe cases, but this is rare. In some cases, benign positional vertigo may resolve on its own in 2-4 weeks. Follow these instructions at home: Safety   Move slowly.Avoid sudden body or head movements.  Avoid driving.  Avoid operating heavy machinery.  Avoid doing any tasks that would be dangerous to you or others if a vertigo episode  would occur.  If you have trouble walking or keeping your balance, try using a cane for stability. If you feel dizzy or unstable, sit down right away.  Return to your normal activities as told by your health care provider. Ask your health care provider what activities are safe for you. General instructions   Take over-the-counter and prescription medicines only as told by your health care provider.  Avoid certain positions or movements as told by your health care provider.  Drink enough fluid to keep your urine clear or pale yellow.  Keep all follow-up visits as told by your health care provider. This is important. Contact a health care provider if:  You have a fever.  Your condition gets worse or you develop new symptoms.  Your family or friends notice any behavioral changes.  Your nausea or vomiting gets worse.  You have numbness or a "pins and needles" sensation. Get help right away if:  You have difficulty speaking or moving.  You are always dizzy.  You faint.  You develop severe headaches.  You have weakness in your legs or arms.  You have changes in your hearing or vision.  You develop a stiff neck.  You develop sensitivity to light. This information is not intended to replace advice given to you by your health care provider. Make sure you discuss any questions you have with your health care provider. Document Released: 02/19/2006 Document Revised: 10/20/2015 Document Reviewed: 09/06/2014 Elsevier Interactive Patient Education  2017 [place]. How to Perform the Epley Maneuver The Epley maneuver is an exercise that relieves symptoms of vertigo. Vertigo is the feeling that you or your surroundings are moving when they are not. When you feel vertigo, you may feel like the room is spinning and have trouble walking. Dizziness is a little different than vertigo. When you are dizzy, you may feel unsteady or light-headed. You can do this maneuver at home whenever you  have symptoms of vertigo. You can do it up to 3 times a day until your symptoms go away. Even though the Epley maneuver may relieve your vertigo for a few weeks, it is possible that your symptoms will return. This maneuver relieves vertigo, but it does not relieve dizziness. What are the risks? If it is done correctly, the Epley maneuver is considered safe. Sometimes it can lead to dizziness or nausea that goes away after a short time. If you develop other symptoms, such as changes in vision, weakness, or numbness, stop doing the maneuver and call your health care provider. How to perform the Epley maneuver 1. Sit on the edge of a bed or table with your back straight and your legs extended or hanging over the edge of the bed or table. 2. Turn your head halfway toward the affected ear or side. 3. Lie backward quickly with your head turned until you are lying flat on your back. You may want to position a pillow under your shoulders. 4. Hold this position for 30 seconds. You may experience an attack of vertigo. This is normal. 5. Turn your head to the opposite direction until  your unaffected ear is facing the floor. 6. Hold this position for 30 seconds. You may experience an attack of vertigo. This is normal. Hold this position until the vertigo stops. 7. Turn your whole body to the same side as your head. Hold for another 30 seconds. 8. Sit back up. You can repeat this exercise up to 3 times a day. Follow these instructions at home:  After doing the Epley maneuver, you can return to your normal activities.  Ask your health care provider if there is anything you should do at home to prevent vertigo. He or she may recommend that you:  Keep your head raised (elevated) with two or more pillows while you sleep.  Do not sleep on the side of your affected ear.  Get up slowly from bed.  Avoid sudden movements during the day.  Avoid extreme head movement, like looking up or bending over. Contact a  health care provider if:  Your vertigo gets worse.  You have other symptoms, including:  Nausea.  Vomiting.  Headache. Get help right away if:  You have vision changes.  You have a severe or worsening headache or neck pain.  You cannot stop vomiting.  You have new numbness or weakness in any part of your body. Summary  Vertigo is the feeling that you or your surroundings are moving when they are not.  The Epley maneuver is an exercise that relieves symptoms of vertigo.  If the Epley maneuver is done correctly, it is considered safe. You can do it up to 3 times a day. This information is not intended to replace advice given to you by your health care provider. Make sure you discuss any questions you have with your health care provider. Document Released: 05/19/2013 Document Revised: 04/03/2016 Document Reviewed: 04/03/2016 Elsevier Interactive Patient Education  2017 [employer].    IF you received an x-ray today, you will receive an invoice from [facility]. Please contact [facility] at [phone] with questions or concerns regarding your invoice.   IF you received labwork today, you will receive an invoice from [place]. Please contact [facility] at [phone] with questions or concerns regarding your invoice.   Our billing staff will not be able to assist you with questions regarding bills from these companies.  You will be contacted with the lab results as soon as they are available. The fastest way to get your results is to activate your My Chart account. Instructions are located on the last page of this paperwork. If you have not heard from [place] regarding the results in 2 weeks, please contact this office.

## 2016-09-13 NOTE — Progress Notes (Signed)
Randy Wilson  MRN: [id number] DOB: 06-May-1966  Subjective:  Randy Wilson is a 51 y.o. male seen in office today for a chief complaint of dizziness x 2 days ago when he tried to get up from bed. Felt like the "room was spinning." It stops a few seconds after he stops moving his head. Has associated headache. Denies blurred vision, chest pian, SOB, nausea, vomiting, hematuria, and lower leg swelling. His allergies have been bothering him lately and he felt really stopped up a few days ago. He notes he has drank enough water today.  Review of Systems  Constitutional: Negative for chills, diaphoresis, fatigue and fever.  HENT: Negative for ear pain, hearing loss and tinnitus.   Cardiovascular: Negative for palpitations.  Genitourinary: Negative for difficulty urinating and dysuria.  Neurological: Negative for tremors, seizures, syncope, facial asymmetry, speech difficulty, weakness and numbness.  Psychiatric/Behavioral: Negative for confusion.    Patient Active Problem List   Diagnosis Date Noted  . 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 Asthma 04/24/2015  . Hx of colonic polyp 11/08/2014  . History of colonic polyps 09/17/2014    Current Outpatient Prescriptions on File Prior to Visit  Medication Sig Dispense Refill  . fluticasone (FLONASE) 50 MCG/ACT nasal spray Place 2 sprays into both nostrils daily. 16 g 6  . meloxicam (MOBIC) 15 MG tablet Take 1 tablet (15 mg total) by mouth daily. (Patient not taking: Reported on 09/13/2016) 30 tablet 1   No current facility-administered medications on file prior to visit.     Allergies  Allergen Reactions  . Tramadol Itching  . Shrimp [Shellfish Allergy] Hives     Objective:  BP 132/86   Pulse 60   Temp 98.2 F (36.8 C) (Oral)   Resp 16   Ht 6' 2.5" (1.892 m)   Wt 239 lb 12.8 oz (108.8 kg)   SpO2 98%   BMI 30.38 kg/m   Physical Exam  Constitutional: He is oriented to person, place, and time and well-developed,  well-nourished, and in no distress. No distress.  HENT:  Head: Normocephalic and atraumatic.  Right Ear: Tympanic membrane, external ear and ear canal normal.  Left Ear: Tympanic membrane, external ear and ear canal normal.  Nose: Mucosal edema (mild bilaterally) present.  Mouth/Throat: Uvula is midline, oropharynx is clear and moist and mucous membranes are normal.  Eyes: Conjunctivae are normal.  Neck: Normal range of motion.  Cardiovascular: Normal rate, regular rhythm, normal heart sounds and intact distal pulses.   Pulmonary/Chest: Effort normal and breath sounds normal. He has no wheezes. He has no rales.  Neurological: He is alert and oriented to person, place, and time. He has normal sensation, normal strength, normal reflexes and intact cranial nerves. He displays no weakness, no tremor and facial symmetry. He has a normal Heel to [employer] and a normal Tandem Gait Test. Gait normal. Gait normal.  +Dix Hallpike to the left.    Skin: Skin is warm and dry.  Psychiatric: Affect normal.  Vitals reviewed.   BP Readings from Last 3 Encounters:  09/13/16 132/86  06/19/16 128/80  06/18/16 120/90   EKG shows sinus bradycardia at 56 bpm. No acute ST or T wave changes. Findings presented to Dr. Mitchel Honour.   Orthostatic VS for the past 24 hrs:  BP- Lying Pulse- Lying BP- Sitting Pulse- Sitting BP- Standing at 0 minutes Pulse- Standing at 0 minutes  09/13/16 1136 131/81 56 131/87 58 132/86 64   Results for orders  placed or performed in visit on 09/13/16 (from the past 24 hour(s))  POCT CBC     Status: Abnormal   Collection Time: 09/13/16 11:49 AM  Result Value Ref Range   WBC 5.9 4.6 - 10.2 K/uL   Lymph, poc 1.7 0.6 - 3.4   POC LYMPH PERCENT 28.5 10 - 50 %L   MID (cbc) 0.5 0 - 0.9   POC MID % 8.6 0 - 12 %M   POC Granulocyte 3.7 2 - 6.9   Granulocyte percent 62.9 37 - 80 %G   RBC 4.79 4.69 - 6.13 M/uL   Hemoglobin 13.9 (A) 14.1 - 18.1 g/dL   HCT, POC 41.5 (A) 43.5 - 53.7 %   MCV  86.5 80 - 97 fL   MCH, POC 29.0 27 - 31.2 pg   MCHC 33.5 31.8 - 35.4 g/dL   RDW, POC 13.2 %   Platelet Count, POC 242 142 - 424 K/uL   MPV 7.6 0 - 99.8 fL  POCT urinalysis dipstick     Status: Abnormal   Collection Time: 09/13/16 11:51 AM  Result Value Ref Range   Color, UA yellow yellow   Clarity, UA clear clear   Glucose, UA negative negative mg/dL   Bilirubin, UA negative negative   Ketones, POC UA negative negative mg/dL   Spec Grav, UA 1.015 1.010 - 1.025   Blood, UA trace-intact (A) negative   pH, UA 6.0 5.0 - 8.0   Protein Ur, POC negative negative mg/dL   Urobilinogen, UA 0.2 0.2 or 1.0 E.U./dL   Nitrite, UA Negative Negative   Leukocytes, UA Negative Negative  POCT Microscopic Urinalysis ([facility])     Status: Abnormal   Collection Time: 09/13/16 12:01 PM  Result Value Ref Range   WBC,UR,HPF,POC None None WBC/hpf   RBC,UR,HPF,POC None None RBC/hpf   Bacteria None None, Too numerous to count   Mucus Absent Absent   Epithelial Cells, UR Per Microscopy Few (A) None, Too numerous to count cells/hpf   Epley Maneuver successfully performed.   Assessment and Plan :  1. Dizziness History and PE findings consistent with BPPV. Dizziness resolved in office with Epley Maneuver. POCT labs show mild anemia. Will repeat in 4-6 weeks. Instructed to return to clinic if symptoms worsen, do not improve, or as needed - POCT CBC - POCT urinalysis dipstick - POCT Microscopic Urinalysis ([facility]) - EKG 12-Lead - Orthostatic vital signs - CMP14+EGFR - meclizine (ANTIVERT) 32 MG tablet; Take 1 tablet (32 mg total) by mouth 3 (three) times daily as needed.  Dispense: 30 tablet; Refill: 0 - [place] PA-C  [facility] 09/13/2016 3:27 PM

## 2016-09-14 ENCOUNTER — Encounter: Payer: Self-pay | Admitting: Gastroenterology

## 2016-09-14 LAB — CMP14+EGFR
ALBUMIN: 4.5 g/dL (ref 3.5–5.5)
ALK PHOS: 67 IU/L (ref 39–117)
ALT: 19 IU/L (ref 0–44)
AST: 19 IU/L (ref 0–40)
Albumin/Globulin Ratio: 1.8 (ref 1.2–2.2)
BUN/Creatinine Ratio: 14 (ref 9–20)
BUN: 13 mg/dL (ref 6–24)
Bilirubin Total: 0.6 mg/dL (ref 0.0–1.2)
CO2: 25 mmol/L (ref 18–29)
CREATININE: 0.96 mg/dL (ref 0.76–1.27)
Calcium: 9.3 mg/dL (ref 8.7–10.2)
Chloride: 102 mmol/L (ref 96–106)
GFR calc Af Amer: 104 mL/min/{1.73_m2} (ref >59)
GFR calc non Af Amer: 90 mL/min/{1.73_m2} (ref >59)
GLUCOSE: 82 mg/dL (ref 65–99)
Globulin, Total: 2.5 g/dL (ref 1.5–4.5)
Potassium: 4.5 mmol/L (ref 3.5–5.2)
Sodium: 139 mmol/L (ref 134–144)
TOTAL PROTEIN: 7 g/dL (ref 6.0–8.5)

## 2016-09-15 ENCOUNTER — Telehealth: Payer: Self-pay

## 2016-09-15 NOTE — Telephone Encounter (Signed)
vertin 32  Needs pa  [id number] covermymeds

## 2016-09-17 ENCOUNTER — Encounter: Payer: Self-pay | Admitting: Emergency Medicine

## 2016-09-24 ENCOUNTER — Telehealth: Payer: Self-pay | Admitting: Physician Assistant

## 2016-09-24 DIAGNOSIS — R42 Dizziness and giddiness: Secondary | ICD-10-CM

## 2016-09-24 NOTE — Telephone Encounter (Signed)
PATIENT STATES HE SAW BRITTANY WISEMAN ABOUT A WEEK AGO FOR DIZZINESS. SHE GAVE HIM A PRESCRIPTION BUT HE HAS LOST IT. HE WOULD LIKE HER TO CALL IT INTO HIS PHARMACY. THE MEDICINE WAS MECLIZINE (ANTIVERT) 32 MG. PHARMACY CHOICE IS [facility] ON [address]. [place]

## 2016-09-25 NOTE — Telephone Encounter (Signed)
Regarding previous message, patient has not lost his RX because the pharmacy sent over a PA request for it today.  I am working on it now.

## 2016-09-25 NOTE — Telephone Encounter (Signed)
Thank you Renay!

## 2016-09-26 NOTE — Telephone Encounter (Signed)
Checked cover my meds, there was a cancellation on the medicine request.  This means that either they will not cover the medication or that the patient does not have active insurance.  I called the patient and he said that will just pay cash for it.

## 2016-09-26 NOTE — Telephone Encounter (Signed)
PT CALLING ABOUT MED REFILL HE STATES THAT DIZZINESS IS GETTING WORST

## 2016-09-27 MED ORDER — MECLIZINE HCL 32 MG PO TABS: 32.0000 mg | ORAL_TABLET | Freq: Three times a day (TID) | ORAL | 0 refills | Status: DC | PRN

## 2016-09-27 NOTE — Telephone Encounter (Signed)
I will give him a refill but if it is getting worse please inform him that he should return to clinic for further evaluation.

## 2016-09-28 NOTE — Telephone Encounter (Signed)
Pt advised, using otc meclizine now with relief. Will come see [place] if cont or worse dizziness

## 2016-10-29 ENCOUNTER — Ambulatory Visit (AMBULATORY_SURGERY_CENTER): Payer: Self-pay | Admitting: *Deleted

## 2016-10-29 VITALS — Ht 74.0 in | Wt 236.0 lb

## 2016-10-29 DIAGNOSIS — Z86010 Personal history of colonic polyps: Secondary | ICD-10-CM

## 2016-10-29 MED ORDER — NA SULFATE-K SULFATE-MG SULF 17.5-3.13-1.6 GM/177ML PO SOLN: 1.0000 | Freq: Once | ORAL | 0 refills | Status: AC

## 2016-10-29 NOTE — Progress Notes (Signed)
No egg or soy allergy known to patient  No issues with past sedation with any surgeries  or procedures, no intubation problems  No diet pills per patient No home 02 use per patient  No blood thinners per patient  Pt denies issues with constipation  No A fib or A flutter  EMMI video declined- pt states has had 4- PNM than 50$ coupon to pt for suprep

## 2016-11-12 ENCOUNTER — Encounter: Payer: BLUE CROSS/BLUE SHIELD | Admitting: Gastroenterology

## 2016-11-19 ENCOUNTER — Encounter: Payer: Self-pay | Admitting: Gastroenterology

## 2016-11-19 ENCOUNTER — Encounter: Payer: BLUE CROSS/BLUE SHIELD | Admitting: Gastroenterology

## 2016-12-03 ENCOUNTER — Encounter: Payer: BLUE CROSS/BLUE SHIELD | Admitting: Gastroenterology

## 2016-12-03 ENCOUNTER — Encounter: Payer: Self-pay | Admitting: Gastroenterology

## 2016-12-03 ENCOUNTER — Ambulatory Visit (AMBULATORY_SURGERY_CENTER): Payer: BLUE CROSS/BLUE SHIELD | Admitting: Gastroenterology

## 2016-12-03 VITALS — BP 118/77 | HR 58 | Temp 98.7°F | Resp 19 | Ht 74.5 in | Wt 239.0 lb

## 2016-12-03 DIAGNOSIS — Z86010 Personal history of colonic polyps: Secondary | ICD-10-CM

## 2016-12-03 MED ORDER — SODIUM CHLORIDE 0.9 % IV SOLN: 500.0000 mL | INTRAVENOUS | Status: DC

## 2016-12-03 NOTE — Patient Instructions (Signed)
YOU HAD AN ENDOSCOPIC PROCEDURE TODAY AT [facility]:   Refer to the procedure report that was given to you for any specific questions about what was found during the examination.  If the procedure report does not answer your questions, please call your gastroenterologist to clarify.  If you requested that your care partner not be given the details of your procedure findings, then the procedure report has been included in a sealed envelope for you to review at your convenience later.  YOU SHOULD EXPECT: Some feelings of bloating in the abdomen. Passage of more gas than usual.  Walking can help get rid of the air that was put into your GI tract during the procedure and reduce the bloating. If you had a lower endoscopy (such as a colonoscopy or flexible sigmoidoscopy) you may notice spotting of blood in your stool or on the toilet paper. If you underwent a bowel prep for your procedure, you may not have a normal bowel movement for a few days.  Please Note:  You might notice some irritation and congestion in your nose or some drainage.  This is from the oxygen used during your procedure.  There is no need for concern and it should clear up in a day or so.  SYMPTOMS TO REPORT IMMEDIATELY:   Following lower endoscopy (colonoscopy or flexible sigmoidoscopy):  Excessive amounts of blood in the stool  Significant tenderness or worsening of abdominal pains  Swelling of the abdomen that is new, acute  Fever of 100F or higher  For urgent or emergent issues, a gastroenterologist can be reached at any hour by calling [phone].   DIET:  We do recommend a small meal at first, but then you may proceed to your regular diet.  Drink plenty of fluids but you should avoid alcoholic beverages for 24 hours.  ACTIVITY:  You should plan to take it easy for the rest of today and you should NOT DRIVE or use heavy machinery until tomorrow (because of the sedation medicines used during the test).     FOLLOW UP: Our staff will call the number listed on your records the next business day following your procedure to check on you and address any questions or concerns that you may have regarding the information given to you following your procedure. If we do not reach you, we will leave a message.  However, if you are feeling well and you are not experiencing any problems, there is no need to return our call.  We will assume that you have returned to your regular daily activities without incident.  If any biopsies were taken you will be contacted by phone or by letter within the next 1-3 weeks.  Please call us at [phone] if you have not heard about the biopsies in 3 weeks.   Repeat Colonoscopy in 1 year to assess disease activity  SIGNATURES/CONFIDENTIALITY: You and/or your care partner have signed paperwork which will be entered into your electronic medical record.  These signatures attest to the fact that that the information above on your After Visit Summary has been reviewed and is understood.  Full responsibility of the confidentiality of this discharge information lies with you and/or your care-partner.

## 2016-12-03 NOTE — Progress Notes (Signed)
To PACU, vss patent aw report to rn

## 2016-12-03 NOTE — Op Note (Signed)
[place] Patient Name: Randy Wilson Procedure Date: 12/03/2016 3:32 PM MRN: [id number] Endoscopist: Beverly. Loletha Carrow , MD Age: 51 Referring MD:  Date of Birth: 18-Sep-1965 Gender: Male Account #: [id number] Procedure:                Colonoscopy Indications:              High risk colon cancer surveillance: Personal                            history of colonic polyps (giant inflammatory polyp                            resected in 2017) Repeat evaluation to see if                            underlying Crohn's disease. Medicines:                Monitored Anesthesia Care Procedure:                Pre-Anesthesia Assessment:                           - Prior to the procedure, a History and Physical                            was performed, and patient medications and                            allergies were reviewed. The patient's tolerance of                            previous anesthesia was also reviewed. The risks                            and benefits of the procedure and the sedation                            options and risks were discussed with the patient.                            All questions were answered, and informed consent                            was obtained. Prior Anticoagulants: The patient has                            taken no previous anticoagulant or antiplatelet                            agents. ASA Grade Assessment: II - A patient with                            mild systemic disease. After reviewing the risks  and benefits, the patient was deemed in                            satisfactory condition to undergo the procedure.                           After obtaining informed consent, the colonoscope                            was passed under direct vision. Throughout the                            procedure, the patient's blood pressure, pulse, and                            oxygen saturations were monitored continuously.  The                            Colonoscope was introduced through the anus and                            advanced to the the ileocolonic anastomosis. The                            colonoscopy was performed without difficulty. The                            patient tolerated the procedure well. The quality                            of the bowel preparation was good. The neo-terminal                            ileum, anastomosis and rectum were photographed. Scope In: 3:38:41 PM Scope Out: 3:49:10 PM Scope Withdrawal Time: 0 hours 7 minutes 55 seconds  Total Procedure Duration: 0 hours 10 minutes 29 seconds  Findings:                 The perianal and digital rectal examinations were                            normal.                           There was evidence of a prior side-to-side                            ileo-colonic anastomosis in the ascending colon.                            This was patent and was characterized by a few                            small erosions and an intact staple line.  Deeper intubation of the neo-terminal ileum was                            normal.                           The exam was otherwise without abnormality on                            direct and retroflexion views. Complications:            No immediate complications. Estimated Blood Loss:     Estimated blood loss: none. Impression:               - Patent side-to-side ileo-colonic anastomosis,                            characterized by erosion and an intact staple line.                           - The examination was otherwise normal on direct                            and retroflexion views.                           - No specimens collected. Recommendation:           - Patient has a contact number available for                            emergencies. The signs and symptoms of potential                            delayed complications were discussed with the                             patient. Return to normal activities tomorrow.                            Written discharge instructions were provided to the                            patient.                           - Resume previous diet.                           - Continue present medications.                           - Repeat colonoscopy in 1 year to assess disease                            activity. Henry L. Loletha Carrow, MD 12/03/2016 3:57:59 PM This report has been signed electronically.

## 2016-12-04 ENCOUNTER — Telehealth: Payer: Self-pay | Admitting: *Deleted

## 2016-12-04 NOTE — Telephone Encounter (Signed)
  Follow up Call-  Call back number 12/03/2016 09/07/2015 09/07/2014  Post procedure Call Back phone  # [phone]  Permission to leave phone message Yes Yes Yes  Some recent data might be hidden     Patient questions:  Do you have a fever, pain , or abdominal swelling? No. Pain Score  0 *  Have you tolerated food without any problems? Yes.    Have you been able to return to your normal activities? Yes.    Do you have any questions about your discharge instructions: Diet   No. Medications  No. Follow up visit  No.  Do you have questions or concerns about your Care? No.  Actions: * If pain score is 4 or above: No action needed, pain <4.

## 2016-12-21 ENCOUNTER — Ambulatory Visit (INDEPENDENT_AMBULATORY_CARE_PROVIDER_SITE_OTHER): Payer: BLUE CROSS/BLUE SHIELD

## 2016-12-21 ENCOUNTER — Ambulatory Visit (INDEPENDENT_AMBULATORY_CARE_PROVIDER_SITE_OTHER): Payer: BLUE CROSS/BLUE SHIELD | Admitting: Family Medicine

## 2016-12-21 VITALS — BP 115/78 | HR 53 | Temp 98.0°F | Resp 18 | Ht 74.5 in | Wt 235.8 lb

## 2016-12-21 DIAGNOSIS — M25512 Pain in left shoulder: Secondary | ICD-10-CM

## 2016-12-21 MED ORDER — FAMOTIDINE 20 MG PO TABS: 20.0000 mg | ORAL_TABLET | Freq: Two times a day (BID) | ORAL | 0 refills | Status: DC

## 2016-12-21 MED ORDER — PREDNISONE 10 MG (21) PO TBPK
ORAL_TABLET | ORAL | 0 refills | Status: DC
Start: 2016-12-21 — End: 2017-01-03

## 2016-12-21 MED ORDER — MELOXICAM 15 MG PO TABS: 15.0000 mg | ORAL_TABLET | Freq: Every day | ORAL | 1 refills | Status: DC

## 2016-12-21 NOTE — Progress Notes (Signed)
Chief Complaint  Patient presents with  . 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 Allergy   . Asthma AS CHILD   NONE NOW  . Chronic enteritis   . Hemorrhoids   . Inflammatory polyps of colon ([place])   . Rectal bleeding NONE IN LAST 3 MONTHS  . Shoulder pain    RIGHT    Current Outpatient Prescriptions  Medication Sig Dispense Refill  . acetaminophen (TYLENOL) 650 MG CR tablet Take 650 mg by mouth every 8 (eight) hours as needed for pain.    . cetirizine (ZYRTEC) 10 MG chewable tablet Chew 10 mg by mouth daily.    . famotidine (PEPCID) 20 MG tablet Take 1 tablet (20 mg total) by mouth 2 (two) times daily. 60 tablet 0  . fluticasone (FLONASE) 50 MCG/ACT nasal spray Place 2 sprays into both nostrils daily. (Patient not taking: Reported on 12/21/2016) 16 g 6  . meclizine (ANTIVERT) 32 MG tablet Take 1 tablet (32 mg total) by mouth 3 (three) times daily as needed. (Patient not taking: Reported on 12/21/2016) 30 tablet 0  . meloxicam (MOBIC) 15 MG tablet Take 1 tablet (15 mg total) by mouth daily. 30 tablet 1  . predniSONE (STERAPRED UNI-PAK 21 TAB) 10 MG (21) TBPK tablet Take 6 tablets on day 1, 5 tabs on day 2, 4 tabs on day 3, 3 tabs on day 4, 2 tabs on day 5, 1 tab on day 6 21 tablet 0   Current Facility-Administered Medications  Medication Dose Route Frequency  Provider Last Rate Last Dose  . 0.9 %  sodium chloride infusion  500 mL Intravenous Continuous Doran Stabler, MD        Allergies:  Allergies  Allergen Reactions  . Tramadol Itching  . Shrimp [Shellfish Allergy] Hives    Past Surgical History:  Procedure Laterality Date  . ANKLE SURGERY Left 2002   bone spurs removed  . COLONOSCOPY    . COLONSCOPY     X 4  . LAPAROSCOPIC ILEOCECECTOMY N/A 11/02/2015   Procedure: LAPAROSCOPIC ILEOCECECTOMY;  Surgeon: Leighton Ruff, MD;  Location: [facility];  Service: General;  Laterality: N/A;  . POLYPECTOMY    . SHOULDER SURGERY Left 2010   bone spurs removed     Social History   Social History  . Marital status: Married    Spouse name: N/A  . Number of children: 3  . Years of education: N/A   Social History Main Topics  . Smoking status: Former Smoker    Packs/day: 1.00    Years: 25.00    Types: Cigarettes    Quit date: 01/21/2006  . Smokeless tobacco: Never Used  . Alcohol use 3.6 oz/week    6 Cans of beer per week  Comment: OCC  . Drug use: No  . Sexual activity: Yes     Comment: with monogamous wife, she takes OCP   Other Topics Concern  . Not on file   Social History Narrative  . No narrative on file    ROS Review of Systems See HPI Constitution: No fevers or chills No malaise No diaphoresis Skin: No rash or itching Eyes: no blurry vision, no double vision GU: no dysuria or hematuria Neuro: no dizziness or headaches  Objective: Vitals:   12/21/16 1040  BP: 115/78  Pulse: (!) 53  Resp: 18  Temp: 98 F (36.7 C)  TempSrc: Oral  SpO2: 97%  Weight: 235 lb 12.8 oz (107 kg)  Height: 6' 2.5" (1.892 m)    Physical Exam  Constitutional: He is oriented to person, place, and time. He appears well-developed and well-nourished.  HENT:  Head: Normocephalic and atraumatic.  Eyes: Conjunctivae and EOM are normal.  Cardiovascular: Normal rate, regular rhythm and normal heart sounds.   Pulmonary/Chest: Effort  normal and breath sounds normal. No respiratory distress. He has no wheezes.  Musculoskeletal:       Arms: Neurological: He is alert and oriented to person, place, and time.      CLINICAL DATA:  51 year old male with a history of anterior shoulder pain.  EXAM: LEFT SHOULDER - 2+ VIEW  COMPARISON:  Chest x-ray 09/23/2013  FINDINGS: No acute fracture identified. Glenohumeral joint appears congruent. No focal soft tissue swelling. No radiopaque foreign body.  Osteolysis of the distal left clavicle, which was present on prior chest x-ray 09/23/2013.  IMPRESSION: No acute bony abnormality.  Left distal clavicle osteolysis, which was present on the plain film of the chest 09/23/2013.   Electronically Signed   By: Corrie Mckusick D.O.   On: 12/21/2016 11:26     Assessment and Plan Emrick was seen today for shoulder pain.  Diagnoses and all orders for this visit:  Left anterior shoulder pain- antiinflammatory prednisone with topical pain relieve with biofreeze  After completing prednisone can start meloxicam pepcid for reflux  Follow up with Orthopedics -     DG Shoulder Left -     meloxicam (MOBIC) 15 MG tablet; Take 1 tablet (15 mg total) by mouth daily. -     Ambulatory referral to Orthopedic Surgery  Other orders -     Discontinue: meloxicam (MOBIC) 15 MG tablet; Take 1 tablet (15 mg total) by mouth daily. -     predniSONE (STERAPRED UNI-PAK 21 TAB) 10 MG (21) TBPK tablet; Take 6 tablets on day 1, 5 tabs on day 2, 4 tabs on day 3, 3 tabs on day 4, 2 tabs on day 5, 1 tab on day 6 -     famotidine (PEPCID) 20 MG tablet; Take 1 tablet (20 mg total) by mouth 2 (two) times daily.  A total of 28 minutes were spent face-to-face with the patient during this encounter and over half of that time was spent on counseling and coordination of care.    [facility]

## 2016-12-21 NOTE — Patient Instructions (Addendum)
Try biofreeze topically    IF you received an x-ray today, you will receive an invoice from [facility]. Please contact [facility] at [phone] with questions or concerns regarding your invoice.   IF you received labwork today, you will receive an invoice from [facility]. Please contact [facility] at [phone] with questions or concerns regarding your invoice.   Our billing staff will not be able to assist you with questions regarding bills from these companies.  You will be contacted with the lab results as soon as they are available. The fastest way to get your results is to activate your My Chart account. Instructions are located on the last page of this paperwork. If you have not heard from [place] regarding the results in 2 weeks, please contact this office.     Shoulder Pain Many things can cause shoulder pain, including:  An injury to the area.  Overuse of the shoulder.  Arthritis.  The source of the pain can be:  Inflammation.  An injury to the shoulder joint.  An injury to a tendon, ligament, or bone.  Follow these instructions at home: Take these actions to help with your pain:  Squeeze a soft ball or a foam pad as much as possible. This helps to keep the shoulder from swelling. It also helps to strengthen the arm.  Take over-the-counter and prescription medicines only as told by your health care provider.  If directed, apply ice to the area: ? Put ice in a plastic bag. ? Place a towel between your skin and the bag. ? Leave the ice on for 20 minutes, 2-3 times per day. Stop applying ice if it does not help with the pain.  If you were given a shoulder sling or immobilizer: ? Wear it as told. ? Remove it to shower or bathe. ? Move your arm as little as possible, but keep your hand moving to prevent swelling.  Contact a health care provider if:  Your pain gets worse.  Your pain is not relieved with medicines.  New pain develops in your arm,  hand, or fingers. Get help right away if:  Your arm, hand, or fingers: ? Tingle. ? Become numb. ? Become swollen. ? Become painful. ? Turn white or blue. This information is not intended to replace advice given to you by your health care provider. Make sure you discuss any questions you have with your health care provider. Document Released: 02/21/2005 Document Revised: 01/08/2016 Document Reviewed: 09/06/2014 Elsevier Interactive Patient Education  2017 [employer].

## 2017-01-03 ENCOUNTER — Telehealth: Payer: Self-pay | Admitting: Family Medicine

## 2017-01-03 MED ORDER — PREDNISONE 10 MG (21) PO TBPK: ORAL_TABLET | ORAL | 0 refills | Status: DC

## 2017-01-03 NOTE — Telephone Encounter (Signed)
Please notify the patient that his prescription was sent in to the pharmacy.

## 2017-01-03 NOTE — Telephone Encounter (Signed)
PATIENT STATES HE SAW DR. Nolon Rod ABOUT 2 WEEKS AGO FOR (L) SHOULDER PAIN. SHE PRESCRIBED HIM TO HAVE PREDNISONE 10 MG. HE STATES HE HAS MISPLACED IT AND WOULD LIKE TO HAVE IT CALLED INTO HIS PHARMACY AGAIN. BEST PHONE [phone] (CELL) PHARMACY CHOICE IS [facility] ON [address]. [place]

## 2017-01-03 NOTE — Telephone Encounter (Signed)
Please advise 

## 2017-01-05 NOTE — Telephone Encounter (Signed)
Pt.notified

## 2017-01-30 ENCOUNTER — Telehealth: Payer: Self-pay | Admitting: Physician Assistant

## 2017-01-30 NOTE — Telephone Encounter (Signed)
Pt is needing a copy of his left shoulder xray to take with him to [facility] around 9

## 2017-01-30 NOTE — Telephone Encounter (Signed)
Patient received copy this am

## 2017-03-05 ENCOUNTER — Other Ambulatory Visit: Payer: Self-pay | Admitting: Orthopedic Surgery

## 2017-03-05 DIAGNOSIS — M7542 Impingement syndrome of left shoulder: Secondary | ICD-10-CM

## 2017-03-07 ENCOUNTER — Encounter: Payer: Self-pay | Admitting: Physician Assistant

## 2017-03-07 ENCOUNTER — Ambulatory Visit (INDEPENDENT_AMBULATORY_CARE_PROVIDER_SITE_OTHER): Payer: BLUE CROSS/BLUE SHIELD | Admitting: Physician Assistant

## 2017-03-07 VITALS — BP 112/82 | HR 66 | Resp 16 | Ht 74.5 in | Wt 237.0 lb

## 2017-03-07 DIAGNOSIS — M62838 Other muscle spasm: Secondary | ICD-10-CM

## 2017-03-07 DIAGNOSIS — M542 Cervicalgia: Secondary | ICD-10-CM | POA: Diagnosis not present

## 2017-03-07 DIAGNOSIS — Z23 Encounter for immunization: Secondary | ICD-10-CM

## 2017-03-07 MED ORDER — CYCLOBENZAPRINE HCL 5 MG PO TABS: 5.0000 mg | ORAL_TABLET | Freq: Three times a day (TID) | ORAL | 0 refills | Status: DC | PRN

## 2017-03-07 MED ORDER — NAPROXEN 500 MG PO TABS: 500.0000 mg | ORAL_TABLET | Freq: Two times a day (BID) | ORAL | 0 refills | Status: DC

## 2017-03-07 NOTE — Progress Notes (Signed)
Randy Wilson  MRN: [id number] DOB: 26-Jan-1966  Subjective:     Randy Wilson is a 51 y.o. male who presents for evaluation of neck pain. Event that precipitated these symptoms: sleeping in odd position on multiple pillows. Onset of symptoms was 4 days ago, and have been gradually improving since that time. Current symptoms are stiffness in left side of neck. Patient denies acute trauma, numbness in left arm, paresthesias in left arm and weakness in left arm. Patient has had recurrent self limited episodes of neck pain in the past. Previous treatments: NSAID, muscle relaxant and massage therapy. Has not tried anything this time for pain.   Review of Systems  Constitutional: Negative for chills, diaphoresis, fatigue and fever.  Cardiovascular: Negative for chest pain.  Musculoskeletal: Negative for back pain and gait problem.  Neurological: Negative for dizziness and headaches.    Patient Active Problem List   Diagnosis Date Noted  . 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 Asthma 04/24/2015  . Hx of colonic polyp 11/08/2014  . History of colonic polyps 09/17/2014    Current Outpatient Prescriptions on File Prior to Visit  Medication Sig Dispense Refill  . acetaminophen (TYLENOL) 650 MG CR tablet Take 650 mg by mouth every 8 (eight) hours as needed for pain.    . cetirizine (ZYRTEC) 10 MG chewable tablet Chew 10 mg by mouth daily.    . famotidine (PEPCID) 20 MG tablet Take 1 tablet (20 mg total) by mouth 2 (two) times daily. (Patient not taking: Reported on 03/07/2017) 60 tablet 0  . fluticasone (FLONASE) 50 MCG/ACT nasal spray Place 2 sprays into both nostrils daily. (Patient not taking: Reported on 12/21/2016) 16 g 6  . meclizine (ANTIVERT) 32 MG tablet Take 1 tablet (32 mg total) by mouth 3 (three) times daily as needed. (Patient not taking: Reported on 12/21/2016) 30 tablet 0  . meloxicam (MOBIC) 15 MG tablet Take 1 tablet (15 mg total) by mouth daily. (Patient not taking: Reported  on 03/07/2017) 30 tablet 1  . predniSONE (STERAPRED UNI-PAK 21 TAB) 10 MG (21) TBPK tablet Take 6 tablets on day 1, 5 tabs on day 2, 4 tabs on day 3, 3 tabs on day 4, 2 tabs on day 5, 1 tab on day 6 (Patient not taking: Reported on 03/07/2017) 21 tablet 0   Current Facility-Administered Medications on File Prior to Visit  Medication Dose Route Frequency Provider Last Rate Last Dose  . 0.9 %  sodium chloride infusion  500 mL Intravenous Continuous Doran Stabler, MD        Allergies  Allergen Reactions  . Tramadol Itching  . Shrimp [Shellfish Allergy] Hives     Objective:  BP 112/82   Pulse 66   Resp 16   Ht 6' 2.5" (1.892 m)   Wt 237 lb (107.5 kg)   SpO2 96%   BMI 30.02 kg/m   Physical Exam  Constitutional: He is oriented to person, place, and time and well-developed, well-nourished, and in no distress.  HENT:  Head: Normocephalic and atraumatic.  Eyes: Conjunctivae are normal.  Neck: Normal range of motion and full passive range of motion without pain. Muscular tenderness (and tightness noted with palpation of left sternocleidomastoid) present. No spinous process tenderness present. No neck rigidity. No edema, no erythema and normal range of motion present.  Cardiovascular: Normal rate, regular rhythm and normal heart sounds.   Pulses:      Carotid pulses are 2+ on the right  side, and 2+ on the left side. Pulmonary/Chest: Effort normal and breath sounds normal. He has no decreased breath sounds. He has no wheezes. He has no rhonchi. He has no rales.  Musculoskeletal:       Right shoulder: Normal.       Left shoulder: He exhibits decreased range of motion (baseline, seeing orthopedics for this) and tenderness (baseline, seeing orthopedics for this).       Cervical back: He exhibits decreased range of motion and spasm (with palpation of left sided trapezius).  Neurological: He is alert and oriented to person, place, and time. He has normal sensation and normal strength. Gait  normal.  Reflex Scores:      Tricep reflexes are 2+ on the right side and 2+ on the left side.      Bicep reflexes are 2+ on the right side and 2+ on the left side.      Brachioradialis reflexes are 2+ on the right side and 2+ on the left side.      Patellar reflexes are 2+ on the right side and 2+ on the left side.      Achilles reflexes are 2+ on the right side and 2+ on the left side. Skin: Skin is warm and dry.  Psychiatric: Affect normal.  Vitals reviewed.      Assessment and Plan :  1. Neck pain Consistent with muscle tightness and spasm from sleeping in odd position. No injury noted. Reassuring that pt is doing better since it started. He does have some left sided sternocleidomastoid tightness and spasm of the trapezius noted on exam. No other acute findings noted. I recommend stretching and heat pack. Given Rx for NSAID and muscle relaxant to use as needed. Given educational material for neck stretches. Pt encouraged to return to clinic if symptoms worsen, do not improve, or as needed - naproxen (NAPROSYN) 500 MG tablet; Take 1 tablet (500 mg total) by mouth 2 (two) times daily with a meal.  Dispense: 30 tablet; Refill: 0  2. Muscle spasm - cyclobenzaprine (FLEXERIL) 5 MG tablet; Take 1 tablet (5 mg total) by mouth 3 (three) times daily as needed for muscle spasms.  Dispense: 60 tablet; Refill: 0  3. Need for prophylactic vaccination and inoculation against influenza - Flu Vaccine QUAD 6+ mos PF IM (Fluarix Quad PF)  Tenna Delaine PA-C  [facility] 03/07/2017 10:00 AM

## 2017-03-07 NOTE — Patient Instructions (Addendum)
I recommend resting today. However, tomorrow I would begin walking and moving around as much as tolerated. Begin stretching in a couple of days. The worse thing you can do for muscle stiffness is avoid movement Use medications as needed.   Just to know, flexeril can cause side effects that may impair your thinking or reactions. Be careful if you drive or do anything that requires you to be awake and alert. Avoid drinking alcohol, which can increase some of the side effects of Flexeril. If 5 mg tablets do not do anything for you, you can take 40m tablets at one time up to three times a day.   NSAIDs like meloxicam have common side effects of heartburn, stomach pain, indigestion, and headache. Could lead to renal insufficiency, stroke, or GI bleed if taken excess amounts outside of what is recommended on label long term.    You should avoid heavy lifting or strenuous repetitive activity. I recommned applying a heating pad to affected area 4-5 x a day. If you do not have one at home, put rice in an old sock and put in microwave for 30 secs for a cheap heating pad. Do not apply directly to skin, use barrier such as towel over the skin. Leave on for 15-20 minutes.  Please perform exercises below. Stretches are to be performed for 2 sets, holding 10-15 seconds each. Recommended to perform this rehab twice daily within pain tolerance for 2 weeks.  If no improvement or symptoms worsen, return to clinic. Thank you for letting me participate in your health and well being.  Cervical Strain and Sprain Rehab Ask your health care provider which exercises are safe for you. Do exercises exactly as told by your health care provider and adjust them as directed. It is normal to feel mild stretching, pulling, tightness, or discomfort as you do these exercises, but you should stop right away if you feel sudden pain or your pain gets worse.Do not begin these exercises until told by your health care provider. Stretching  and range of motion exercises These exercises warm up your muscles and joints and improve the movement and flexibility of your neck. These exercises also help to relieve pain, numbness, and tingling. Exercise A: Cervical side bend  1. Using good posture, sit on a stable chair or stand up. 2. Without moving your shoulders, slowly tilt your left / right ear to your shoulder until you feel a stretch in your neck muscles. You should be looking straight ahead. 3. Hold for __________ seconds. 4. Repeat with the other side of your neck. Repeat __________ times. Complete this exercise __________ times a day. Exercise B: Cervical rotation  1. Using good posture, sit on a stable chair or stand up. 2. Slowly turn your head to the side as if you are looking over your left / right shoulder. ? Keep your eyes level with the ground. ? Stop when you feel a stretch along the side and the back of your neck. 3. Hold for __________ seconds. 4. Repeat this by turning to your other side. Repeat __________ times. Complete this exercise __________ times a day. Exercise C: Thoracic extension and pectoral stretch 1. Roll a towel or a small blanket so it is about 4 inches (10 cm) in diameter. 2. Lie down on your back on a firm surface. 3. Put the towel lengthwise, under your spine in the middle of your back. It should not be not under your shoulder blades. The towel should line up with your  spine from your middle back to your lower back. 4. Put your hands behind your head and let your elbows fall out to your sides. 5. Hold for __________ seconds. Repeat __________ times. Complete this exercise __________ times a day. Strengthening exercises These exercises build strength and endurance in your neck. Endurance is the ability to use your muscles for a long time, even after your muscles get tired. Exercise D: Upper cervical flexion, isometric 1. Lie on your back with a thin pillow behind your head and a small rolled-up  towel under your neck. 2. Gently tuck your chin toward your chest and nod your head down to look toward your feet. Do not lift your head off the pillow. 3. Hold for __________ seconds. 4. Release the tension slowly. Relax your neck muscles completely before you repeat this exercise. Repeat __________ times. Complete this exercise __________ times a day. Exercise E: Cervical extension, isometric  1. Stand about 6 inches (15 cm) away from a wall, with your back facing the wall. 2. Place a soft object, about 6-8 inches (15-20 cm) in diameter, between the back of your head and the wall. A soft object could be a small pillow, a ball, or a folded towel. 3. Gently tilt your head back and press into the soft object. Keep your jaw and forehead relaxed. 4. Hold for __________ seconds. 5. Release the tension slowly. Relax your neck muscles completely before you repeat this exercise. Repeat __________ times. Complete this exercise __________ times a day. Posture and body mechanics  Body mechanics refers to the movements and positions of your body while you do your daily activities. Posture is part of body mechanics. Good posture and healthy body mechanics can help to relieve stress in your body's tissues and joints. Good posture means that your spine is in its natural S-curve position (your spine is neutral), your shoulders are pulled back slightly, and your head is not tipped forward. The following are general guidelines for applying improved posture and body mechanics to your everyday activities. Standing  When standing, keep your spine neutral and keep your feet about hip-width apart. Keep a slight bend in your knees. Your ears, shoulders, and hips should line up.  When you do a task in which you stand in one place for a long time, place one foot up on a stable object that is 2-4 inches (5-10 cm) high, such as a footstool. This helps keep your spine neutral. Sitting   When sitting, keep your spine  neutral and your keep feet flat on the floor. Use a footrest, if necessary, and keep your thighs parallel to the floor. Avoid rounding your shoulders, and avoid tilting your head forward.  When working at a desk or a computer, keep your desk at a height where your hands are slightly lower than your elbows. Slide your chair under your desk so you are close enough to maintain good posture.  When working at a computer, place your monitor at a height where you are looking straight ahead and you do not have to tilt your head forward or downward to look at the screen. Resting When lying down and resting, avoid positions that are most painful for you. Try to support your neck in a neutral position. You can use a contour pillow or a small rolled-up towel. Your pillow should support your neck but not push on it. This information is not intended to replace advice given to you by your health care provider. Make sure you discuss  any questions you have with your health care provider. Document Released: 05/14/2005 Document Revised: 01/19/2016 Document Reviewed: 04/20/2015 Elsevier Interactive Patient Education  2018 [employer].      IF you received an x-ray today, you will receive an invoice from [facility]. Please contact [facility] at [phone] with questions or concerns regarding your invoice.   IF you received labwork today, you will receive an invoice from [facility]. Please contact [facility] at [phone] with questions or concerns regarding your invoice.   Our billing staff will not be able to assist you with questions regarding bills from these companies.  You will be contacted with the lab results as soon as they are available. The fastest way to get your results is to activate your My Chart account. Instructions are located on the last page of this paperwork. If you have not heard from [place] regarding the results in 2 weeks, please contact this office.

## 2017-03-07 NOTE — Progress Notes (Deleted)
Subjective:     Randy Wilson is a 51 y.o. male who presents for evaluation of neck pain. Event that precipitated these symptoms: {inciting event:14349}. Onset of symptoms was {1-12:10294} {time; units:19136} ago, and have been {course:17::"unchanged"} since that time. Current symptoms are {back symptoms:16447}. Patient denies {back symptoms:16447}. Patient has had {prior neck pain:5777::"no prior neck problems"}. Previous treatments: {prior neck tx:10608::"none"}.  {Common ambulatory SmartLinks:19316}  Review of Systems {ros; complete:30496}    Objective:    BP 112/82   Pulse 66   Resp 16   Ht 6' 2.5" (1.892 m)   Wt 237 lb (107.5 kg)   SpO2 96%   BMI 30.02 kg/m  General:   {gen appearance:16600}  External Deformity:  {severity:19437::"absent"}  ROM Cervical Spine:  {neck rom:31875::"normal range of motion"}  Midline Tenderness:  {severity:19437} {right/left/midline:19183}  Paraspinous tenderness:  {severity:19437} {right/left/midline:19183}  UE Neurologic Exam:  {upper extremity neuro exam:14064}   X-ray of the cervical spine: {cervical x-ray results:60091}    Assessment:    {neck pain dx list:14428}    Plan:    {neck pain treatment plan:14429}

## 2017-03-20 ENCOUNTER — Ambulatory Visit
Admission: RE | Admit: 2017-03-20 | Discharge: 2017-03-20 | Disposition: A | Discharge status: Home / Self Care | Payer: BLUE CROSS/BLUE SHIELD | Source: Ambulatory Visit | Attending: Orthopedic Surgery | Admitting: Orthopedic Surgery

## 2017-03-20 DIAGNOSIS — M7542 Impingement syndrome of left shoulder: Secondary | ICD-10-CM

## 2017-05-09 ENCOUNTER — Encounter: Payer: Self-pay | Admitting: Physician Assistant

## 2017-05-09 ENCOUNTER — Ambulatory Visit (INDEPENDENT_AMBULATORY_CARE_PROVIDER_SITE_OTHER): Payer: BLUE CROSS/BLUE SHIELD | Admitting: Physician Assistant

## 2017-05-09 VITALS — BP 132/80 | HR 74 | Temp 98.0°F | Resp 16 | Ht 74.5 in | Wt 227.0 lb

## 2017-05-09 DIAGNOSIS — H8110 Benign paroxysmal vertigo, unspecified ear: Secondary | ICD-10-CM

## 2017-05-09 DIAGNOSIS — G8929 Other chronic pain: Secondary | ICD-10-CM

## 2017-05-09 DIAGNOSIS — R42 Dizziness and giddiness: Secondary | ICD-10-CM

## 2017-05-09 DIAGNOSIS — M25512 Pain in left shoulder: Secondary | ICD-10-CM

## 2017-05-09 MED ORDER — MECLIZINE HCL 32 MG PO TABS: 32.0000 mg | ORAL_TABLET | Freq: Three times a day (TID) | ORAL | 0 refills | Status: DC | PRN

## 2017-05-09 NOTE — Progress Notes (Signed)
     IF you received an x-ray today, you will receive an invoice from [facility]. Please contact [facility] at [phone] with questions or concerns regarding your invoice.   IF you received labwork today, you will receive an invoice from [facility]. Please contact [facility] at [phone] with questions or concerns regarding your invoice.   Our billing staff will not be able to assist you with questions regarding bills from these companies.  You will be contacted with the lab results as soon as they are available. The fastest way to get your results is to activate your My Chart account. Instructions are located on the last page of this paperwork. If you have not heard from us regarding the results in 2 weeks, please contact this office.     

## 2017-05-09 NOTE — Patient Instructions (Signed)
Your history and physical exam findings are consistent with BPPV.  This is dizziness that is elicited with head movements.  I have given you a prescription for meclizine to use.  However, the main treatment is the Epley maneuver.  We tried this in the office today but it may take additional attempts to help fully resolve her symptoms.  I have given you information on how to perform the Epley move maneuver below.  I have also given you a referral for physical therapy for vestibular rehabilitation.  While waiting for this referral, if you develop any vomiting, headache, weakness, or slurred speech seek care immediately.     Benign Positional Vertigo Vertigo is the feeling that you or your surroundings are moving when they are not. Benign positional vertigo is the most common form of vertigo. The cause of this condition is not serious (is benign). This condition is triggered by certain movements and positions (is positional). This condition can be dangerous if it occurs while you are doing something that could endanger you or others, such as driving. What are the causes? In many cases, the cause of this condition is not known. It may be caused by a disturbance in an area of the inner ear that helps your brain to sense movement and balance. This disturbance can be caused by a viral infection (labyrinthitis), head injury, or repetitive motion. What increases the risk? This condition is more likely to develop in:  Women.  People who are 8 years of age or older.  What are the signs or symptoms? Symptoms of this condition usually happen when you move your head or your eyes in different directions. Symptoms may start suddenly, and they usually last for less than a minute. Symptoms may include:  Loss of balance and falling.  Feeling like you are spinning or moving.  Feeling like your surroundings are spinning or moving.  Nausea and vomiting.  Blurred vision.  Dizziness.  Involuntary eye  movement (nystagmus).  Symptoms can be mild and cause only slight annoyance, or they can be severe and interfere with daily life. Episodes of benign positional vertigo may return (recur) over time, and they may be triggered by certain movements. Symptoms may improve over time. How is this diagnosed? This condition is usually diagnosed by medical history and a physical exam of the head, neck, and ears. You may be referred to a health care provider who specializes in ear, nose, and throat (ENT) problems (otolaryngologist) or a provider who specializes in disorders of the nervous system (neurologist). You may have additional testing, including:  MRI.  A CT scan.  Eye movement tests. Your health care provider may ask you to change positions quickly while he or she watches you for symptoms of benign positional vertigo, such as nystagmus. Eye movement may be tested with an electronystagmogram (ENG), caloric stimulation, the Dix-Hallpike test, or the roll test.  An electroencephalogram (EEG). This records electrical activity in your brain.  Hearing tests.  How is this treated? Usually, your health care provider will treat this by moving your head in specific positions to adjust your inner ear back to normal. Surgery may be needed in severe cases, but this is rare. In some cases, benign positional vertigo may resolve on its own in 2-4 weeks. Follow these instructions at home: Safety  Move slowly.Avoid sudden body or head movements.  Avoid driving.  Avoid operating heavy machinery.  Avoid doing any tasks that would be dangerous to you or others if a vertigo episode  would occur.  If you have trouble walking or keeping your balance, try using a cane for stability. If you feel dizzy or unstable, sit down right away.  Return to your normal activities as told by your health care provider. Ask your health care provider what activities are safe for you. General instructions  Take over-the-counter  and prescription medicines only as told by your health care provider.  Avoid certain positions or movements as told by your health care provider.  Drink enough fluid to keep your urine clear or pale yellow.  Keep all follow-up visits as told by your health care provider. This is important. Contact a health care provider if:  You have a fever.  Your condition gets worse or you develop new symptoms.  Your family or friends notice any behavioral changes.  Your nausea or vomiting gets worse.  You have numbness or a "pins and needles" sensation. Get help right away if:  You have difficulty speaking or moving.  You are always dizzy.  You faint.  You develop severe headaches.  You have weakness in your legs or arms.  You have changes in your hearing or vision.  You develop a stiff neck.  You develop sensitivity to light. This information is not intended to replace advice given to you by your health care provider. Make sure you discuss any questions you have with your health care provider. Document Released: 02/19/2006 Document Revised: 10/20/2015 Document Reviewed: 09/06/2014 Elsevier Interactive Patient Education  2018 [employer].    How to Perform the Epley Maneuver The Epley maneuver is an exercise that relieves symptoms of vertigo. Vertigo is the feeling that you or your surroundings are moving when they are not. When you feel vertigo, you may feel like the room is spinning and have trouble walking. Dizziness is a little different than vertigo. When you are dizzy, you may feel unsteady or light-headed. You can do this maneuver at home whenever you have symptoms of vertigo. You can do it up to 3 times a day until your symptoms go away. Even though the Epley maneuver may relieve your vertigo for a few weeks, it is possible that your symptoms will return. This maneuver relieves vertigo, but it does not relieve dizziness. What are the risks? If it is done correctly, the  Epley maneuver is considered safe. Sometimes it can lead to dizziness or nausea that goes away after a short time. If you develop other symptoms, such as changes in vision, weakness, or numbness, stop doing the maneuver and call your health care provider. How to perform the Epley maneuver 1. Sit on the edge of a bed or table with your back straight and your legs extended or hanging over the edge of the bed or table. 2. Turn your head halfway toward the affected ear or side. 3. Lie backward quickly with your head turned until you are lying flat on your back. You may want to position a pillow under your shoulders. 4. Hold this position for 30 seconds. You may experience an attack of vertigo. This is normal. 5. Turn your head to the opposite direction until your unaffected ear is facing the floor. 6. Hold this position for 30 seconds. You may experience an attack of vertigo. This is normal. Hold this position until the vertigo stops. 7. Turn your whole body to the same side as your head. Hold for another 30 seconds. 8. Sit back up. You can repeat this exercise up to 3 times a day. Follow these  instructions at home:  After doing the Epley maneuver, you can return to your normal activities.  Ask your health care provider if there is anything you should do at home to prevent vertigo. He or she may recommend that you: ? Keep your head raised (elevated) with two or more pillows while you sleep. ? Do not sleep on the side of your affected ear. ? Get up slowly from bed. ? Avoid sudden movements during the day. ? Avoid extreme head movement, like looking up or bending over. Contact a health care provider if:  Your vertigo gets worse.  You have other symptoms, including: ? Nausea. ? Vomiting. ? Headache. Get help right away if:  You have vision changes.  You have a severe or worsening headache or neck pain.  You cannot stop vomiting.  You have new numbness or weakness in any part of your  body. Summary  Vertigo is the feeling that you or your surroundings are moving when they are not.  The Epley maneuver is an exercise that relieves symptoms of vertigo.  If the Epley maneuver is done correctly, it is considered safe. You can do it up to 3 times a day. This information is not intended to replace advice given to you by your health care provider. Make sure you discuss any questions you have with your health care provider. Document Released: 05/19/2013 Document Revised: 04/03/2016 Document Reviewed: 04/03/2016 Elsevier Interactive Patient Education  2017 [employer].
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
## 2017-05-13 ENCOUNTER — Encounter: Payer: Self-pay | Admitting: Physician Assistant

## 2017-05-16 ENCOUNTER — Encounter: Payer: Self-pay | Admitting: Rehabilitative and Restorative Service Providers"

## 2017-05-16 ENCOUNTER — Telehealth: Payer: Self-pay | Admitting: *Deleted

## 2017-05-16 ENCOUNTER — Ambulatory Visit
Payer: BLUE CROSS/BLUE SHIELD | Attending: Physician Assistant | Admitting: Rehabilitative and Restorative Service Providers"

## 2017-05-16 DIAGNOSIS — H8111 Benign paroxysmal vertigo, right ear: Secondary | ICD-10-CM

## 2017-05-16 DIAGNOSIS — R2689 Other abnormalities of gait and mobility: Secondary | ICD-10-CM

## 2017-05-16 DIAGNOSIS — R42 Dizziness and giddiness: Secondary | ICD-10-CM

## 2017-05-16 NOTE — Telephone Encounter (Signed)
Copied from [place] [id number]. Topic: Inquiry >> May 16, 2017 11:02 AM Oliver Pila B wrote: Reason for CRM: Judeen Hammans from [facility] called b/c they will not be able to abide by the referral b/c the pt has an outstanding balance that has been sent to collections so they are going to disregard the referral

## 2017-05-16 NOTE — Telephone Encounter (Signed)
We please call patient let him know this.  Thank you

## 2017-05-16 NOTE — Patient Instructions (Addendum)
Habituation - Tip Card  1.The goal of habituation training is to assist in decreasing symptoms of vertigo, dizziness, or nausea provoked by specific head and body motions. 2.These exercises may initially increase symptoms; however, be persistent and work through symptoms. With repetition and time, the exercises will assist in reducing or eliminating symptoms. 3.Exercises should be stopped and discussed with the therapist if you experience any of the following: - Sudden change or fluctuation in hearing - New onset of ringing in the ears, or increase in current intensity - Any fluid discharge from the ear - Severe pain in neck or back - Extreme nausea  Copyright  VHI. All rights reserved.   Habituation - Sit to Side-Lying   Sit on edge of bed. Lie down onto the right side and hold until dizziness stops, plus 20 seconds.  Return to sitting and wait until dizziness stops, plus 20 seconds.  Repeat to the left side. Repeat sequence 5 times per session. Do 2 sessions per day.  Copyright  VHI. All rights reserved.    Gaze Stabilization - Tip Card  1.Target must remain in focus, not blurry, and appear stationary while head is in motion. 2.Perform exercises with small head movements (45 to either side of midline). 3.Increase speed of head motion so long as target is in focus. 4.If you wear eyeglasses, be sure you can see target through lens (therapist will give specific instructions for bifocal / progressive lenses). 5.These exercises may provoke dizziness or nausea. Work through these symptoms. If too dizzy, slow head movement slightly. Rest between each exercise. 6.Exercises demand concentration; avoid distractions. 7.For safety, perform standing exercises close to a counter, wall, corner, or next to someone.  Copyright  VHI. All rights reserved.   Gaze Stabilization - Standing Feet Apart   Feet shoulder width apart, keeping eyes on target on wall 3 feet away, tilt head down slightly and  move head side to side for 30 seconds. Repeat while moving head up and down for 30 seconds. *Work up to tolerating 60 seconds, as able. Do 2-3 sessions per day.   Copyright  VHI. All rights reserved.

## 2017-05-17 NOTE — Therapy (Signed)
[facility] [address] [place], [place], [address] Phone: [phone]   Fax:  [phone]  Physical Therapy Treatment  Patient Details  Name: Randy Wilson MRN: [id number] Date of Birth: 12/25/1965 Referring Provider: Tenna Delaine, PA   Encounter Date: 05/16/2017  PT End of Session - 05/17/17 1030    Visit Number  1    Number of Visits  6    Date for PT Re-Evaluation  06/30/17    Authorization Type  Private insurance    PT Start Time  1320    PT Stop Time  1400    PT Time Calculation (min)  40 min    Activity Tolerance  Patient tolerated treatment well    Behavior During Therapy  [facility] for tasks assessed/performed       Past Medical History:  Diagnosis Date  . Allergy   . Asthma AS CHILD   NONE NOW  . Chronic enteritis   . Hemorrhoids   . Inflammatory polyps of colon ([place])   . Rectal bleeding NONE IN LAST 3 MONTHS  . Shoulder pain    RIGHT    Past Surgical History:  Procedure Laterality Date  . ANKLE SURGERY Left 2002   bone spurs removed  . COLONOSCOPY    . COLONSCOPY     X 4  . LAPAROSCOPIC ILEOCECECTOMY N/A 11/02/2015   Procedure: LAPAROSCOPIC ILEOCECECTOMY;  Surgeon: Leighton Ruff, MD;  Location: [facility];  Service: General;  Laterality: N/A;  . POLYPECTOMY    . SHOULDER SURGERY Left 2010   bone spurs removed     There were no vitals filed for this visit.  Subjective Assessment - 05/16/17 1321    Subjective  The patient had intiial onset of vertigo in 08/2016.  He notes symptoms lasted for 3 weeks and were present when lying down and moving his head.  He reports it resolved on its own.  He reports it returned 3 weeks ago and it is happening all of the time. He thinks the R side is more symptomatic.  He notes getting into/out of bed aggravaytes symtpoms and looking up aggravates symptoms.     Patient Stated Goals  Get rid of the dizziness.      Currently in Pain?  No/denies         [facility] PT  Assessment - 05/16/17 1323      Assessment   Medical Diagnosis  BPPV    Referring Provider  Tenna Delaine, [place]    Onset Date/Surgical Date  -- 08/2016    Prior Therapy  none      Precautions   Precautions  None      Restrictions   Weight Bearing Restrictions  No      Balance Screen   Has the patient fallen in the past 6 months  No    Has the patient had a decrease in activity level because of a fear of falling?   No    Is the patient reluctant to leave their home because of a fear of falling?   No      Home Environment   Living Environment  Private residence      Prior Function   Level of Independence  Independent      Observation/Other Assessments   Focus on Therapeutic Outcomes (FOTO)   97%         Vestibular Assessment - 05/16/17 1324      Vestibular Assessment   General Observation  Walks independently  into clinic      Symptom Behavior   Type of Dizziness  Spinning    Frequency of Dizziness  regularly    Duration of Dizziness  seconds    Aggravating Factors  Looking up to the ceiling;Rolling to left;Rolling to right    Relieving Factors  Head stationary      Occulomotor Exam   Occulomotor Alignment  Abnormal patient has right eye hypertropia (mild)    Spontaneous  Absent    Gaze-induced  Absent    Smooth Pursuits  Intact    Saccades  Intact    Comment  Notes intermittent tinnitus      Vestibulo-Occular Reflex   VOR 1 Head Only (x 1 viewing)  slow gaze feels like it is going to provoke symptoms-- tolerated at slow pace x 4 reps    Comment  Head impulse test=positive head impulse test to the right side for refixation saccade.  With faster pace, the patient is unable to maintain fixation on target.  He also notes subjective symptoms of mild provocation of symptoms.       Visual Acuity   Static  line 7    Dynamic   line 2 5 line difference, >2 is significant for hypofunction      Positional Testing   Dix-Hallpike  Dix-Hallpike Right;Dix-Hallpike Left     Horizontal Canal Testing  Horizontal Canal Right;Horizontal Canal Left      Dix-Hallpike Right   Dix-Hallpike Right Duration  16    Dix-Hallpike Right Symptoms  Upbeat, right rotatory nystagmus      Dix-Hallpike Left   Dix-Hallpike Left Duration  0    Dix-Hallpike Left Symptoms  No nystagmus      Horizontal Canal Right   Horizontal Canal Right Duration  0    Horizontal Canal Right Symptoms  Normal      Horizontal Canal Left   Horizontal Canal Left Duration  0    Horizontal Canal Left Symptoms  Normal               Vestibular Treatment/Exercise - 05/16/17 1340      Vestibular Treatment/Exercise   Vestibular Treatment Provided  Canalith Repositioning;Habituation;Gaze    Canalith Repositioning  Epley Manuever Right    Habituation Exercises  Loss adjuster, chartered    Gaze Exercises  X1 Viewing Horizontal       EPLEY MANUEVER RIGHT   Number of Reps   2    Overall Response  No change    Response Details   Symptoms did not resolve with 2 reps of Epley's      Nestor Lewandowsky   Number of Reps   3    Symptom Description   longest duration of symptoms with return to sit from the right side; reduces with repetition.to <6 secons on 2nd rep      X1 Viewing Horizontal   Foot Position  standing    Comments  x 30 seconds with cues on maintaining visual fixation.            PT Education - 05/17/17 1029    Education provided  Yes    Education Details  HEP: habituation brandt daroff and gaze adaptation     Person(s) Educated  Patient    Methods  Explanation;Demonstration;Handout    Comprehension  Verbalized understanding;Returned demonstration       PT Short Term Goals - 05/17/17 1031      PT SHORT TERM GOAL #1   Title  The patient will be indep with  HEP for habituation, gaze and balance, if indicated.    Time  4    Period  Weeks    Target Date  06/15/17      PT SHORT TERM GOAL #2   Title  The patient will be further assessed on balance and goals to follow, if indicated.     Time  4    Period  Weeks    Target Date  06/15/17        PT Long Term Goals - 05/17/17 1031      PT LONG TERM GOAL #1   Title  The patient will have negative positional testing noting resolution of BPPV    Time  6    Period  Weeks    Target Date  06/30/17      PT LONG TERM GOAL #2   Title  The patient will verbalize understanding of self mgmt of recurring vertigo due to recent recurrence.    Time  6    Period  Weeks    Target Date  06/30/17            Plan - 05/16/17 1350    Clinical Impression Statement  The patient is a 51 year old male presenting to OP rehab with R posterior canalithiasis that did not respond to 2 reps of Epley's maneuver today.  PT instructed him in habituation HEP to continue and gaze adaptation to address VOR deficits identified her positive head impulse test and 5 line SVA vs DVA.      Clinical Presentation  Stable    Clinical Presentation due to:  independent with mobility    Clinical Decision Making  Low    Rehab Potential  Good    PT Frequency  1x / week    PT Duration  6 weeks    PT Treatment/Interventions  ADLs/Self Care Home Management;Balance training;Neuromuscular re-education;Patient/family education;Therapeutic activities;Therapeutic exercise;Vestibular;Canalith Repostioning;Gait training    PT Next Visit Plan  Recheck R BPPV, check habituation, assess balance.    Consulted and Agree with Plan of Care  Patient       Patient will benefit from skilled therapeutic intervention in order to improve the following deficits and impairments:  Dizziness, Decreased balance  Visit Diagnosis: BPPV (benign paroxysmal positional vertigo), right  Dizziness and giddiness  Other abnormalities of gait and mobility     Problem List Patient Active Problem List   Diagnosis Date Noted  . 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 Asthma 04/24/2015  . Hx of colonic polyp 11/08/2014  . History of colonic polyps 09/17/2014     Alijah Akram , PT 05/17/2017, 10:35 AM  [facility] [address] [place], [place], [address] Phone: [phone]   Fax:  [phone]  Name: Randy Wilson MRN: [id number] Date of Birth: 27-Jul-1965

## 2017-05-20 NOTE — Telephone Encounter (Signed)
Patient advised.

## 2017-06-06 ENCOUNTER — Encounter: Payer: Self-pay | Admitting: Rehabilitative and Restorative Service Providers"

## 2017-06-06 ENCOUNTER — Ambulatory Visit
Payer: BLUE CROSS/BLUE SHIELD | Attending: Physician Assistant | Admitting: Rehabilitative and Restorative Service Providers"

## 2017-06-06 DIAGNOSIS — R42 Dizziness and giddiness: Secondary | ICD-10-CM | POA: Insufficient documentation

## 2017-06-06 DIAGNOSIS — H8111 Benign paroxysmal vertigo, right ear: Secondary | ICD-10-CM | POA: Diagnosis not present

## 2017-06-06 NOTE — Therapy (Signed)
[facility] [address] [place], [place], [address] Phone: [phone]   Fax:  [phone]  Physical Therapy Treatment and Discharge Summary  Patient Details  Name: Randy Wilson MRN: [id number] Date of Birth: 07-Apr-1966 Referring Provider: Tenna Delaine, PA   Encounter Date: 06/06/2017  PT End of Session - 06/06/17 1338    Visit Number  2    Number of Visits  6    Date for PT Re-Evaluation  06/30/17    Authorization Type  private insurance    PT Start Time  1318    PT Stop Time  1331    PT Time Calculation (min)  13 min       Past Medical History:  Diagnosis Date  . Allergy   . Asthma AS CHILD   NONE NOW  . Chronic enteritis   . Hemorrhoids   . Inflammatory polyps of colon ([place])   . Rectal bleeding NONE IN LAST 3 MONTHS  . Shoulder pain    RIGHT    Past Surgical History:  Procedure Laterality Date  . ANKLE SURGERY Left 2002   bone spurs removed  . COLONOSCOPY    . COLONSCOPY     X 4  . LAPAROSCOPIC ILEOCECECTOMY N/A 11/02/2015   Procedure: LAPAROSCOPIC ILEOCECECTOMY;  Surgeon: Leighton Ruff, MD;  Location: [facility];  Service: General;  Laterality: N/A;  . POLYPECTOMY    . SHOULDER SURGERY Left 2010   bone spurs removed     There were no vitals filed for this visit.  Subjective Assessment - 06/06/17 1321    Subjective  The patient reports he has not had any further episodes of vertigo since evaluation/initial treatment.  He tried exercises intermittently without difficulty.     Patient Stated Goals  Get rid of the dizziness.      Currently in Pain?  No/denies             Vestibular Assessment - 06/06/17 1322      Positional Testing   Dix-Hallpike  Dix-Hallpike Right;Dix-Hallpike Left    Horizontal Canal Testing  Horizontal Canal Right;Horizontal Canal Left      Dix-Hallpike Right   Dix-Hallpike Right Duration  0    Dix-Hallpike Right Symptoms  No nystagmus      Dix-Hallpike Left   Dix-Hallpike Left Duration  0    Dix-Hallpike Left Symptoms  No nystagmus      Horizontal Canal Right   Horizontal Canal Right Duration  0    Horizontal Canal Right Symptoms  Normal      Horizontal Canal Left   Horizontal Canal Left Duration  0    Horizontal Canal Left Symptoms  Normal              [facility] Adult PT Treatment/Exercise - 06/06/17 1335      Self-Care   Self-Care  Other Self-Care Comments    Other Self-Care Comments   The patient and PT discussed possibility of recurrence since he has had 2 episodes in the past year.  PT provided updated copy of brandt daroff and recommended if reoccurs, he should be evaluated by MD to ensure vertigo and then can return to home exercises.  If those do not treat, can return to vestibular rehab as indicated.             PT Education - 06/06/17 1338    Education provided  Yes    Education Details  reviewed habituation and what to do if recurrence happens  Person(s) Educated  Patient    Methods  Explanation    Comprehension  Verbalized understanding       PT Short Term Goals - 06/06/17 1329      PT SHORT TERM GOAL #1   Title  The patient will be indep with HEP for habituation, gaze and balance, if indicated.    Time  4    Period  Weeks    Status  Achieved      PT SHORT TERM GOAL #2   Title  The patient will be further assessed on balance and goals to follow, if indicated.    Time  4    Period  Weeks    Status  Deferred        PT Long Term Goals - 06/06/17 1329      PT LONG TERM GOAL #1   Title  The patient will have negative positional testing noting resolution of BPPV    Time  6    Period  Weeks    Status  Achieved      PT LONG TERM GOAL #2   Title  The patient will verbalize understanding of self mgmt of recurring vertigo due to recent recurrence.    Time  6    Period  Weeks    Status  Achieved            Plan - 06/06/17 1338    Clinical Impression Statement  The patient met STGs and LTGs for  physical therapy.  He notes no further incidence of vertigo since initial treatment.  PT and patient discussed self mgmt of recurring symptoms.  No further therapy indicated at this time.     PT Treatment/Interventions  ADLs/Self Care Home Management;Balance training;Neuromuscular re-education;Patient/family education;Therapeutic activities;Therapeutic exercise;Vestibular;Canalith Repostioning;Gait training    PT Next Visit Plan  Discharge today    Consulted and Agree with Plan of Care  Patient       Patient will benefit from skilled therapeutic intervention in order to improve the following deficits and impairments:  Dizziness, Decreased balance  Visit Diagnosis: BPPV (benign paroxysmal positional vertigo), right  Dizziness and giddiness    PHYSICAL THERAPY DISCHARGE SUMMARY  Visits from Start of Care: 2  Current functional level related to goals / functional outcomes: See above- all goals met   Remaining deficits: None   Education / Equipment: Home program for future self mgmt of symptoms.  Plan: Patient agrees to discharge.  Patient goals were met. Patient is being discharged due to meeting the stated rehab goals.  ?????          Thank you for the referral of this patient. Rudell Cobb, MPT  Saleema Weppler 06/06/2017, 1:40 PM  [facility] [address] [place], [place], [address] Phone: [phone]   Fax:  [phone]  Name: Randy Wilson MRN: [id number] Date of Birth: 08-May-1966

## 2017-06-19 ENCOUNTER — Ambulatory Visit: Payer: BLUE CROSS/BLUE SHIELD | Admitting: Rehabilitative and Restorative Service Providers"

## 2017-10-19 ENCOUNTER — Encounter: Payer: 59 | Admitting: Physician Assistant

## 2017-10-24 ENCOUNTER — Ambulatory Visit (INDEPENDENT_AMBULATORY_CARE_PROVIDER_SITE_OTHER): Payer: 59 | Admitting: Physician Assistant

## 2017-10-24 ENCOUNTER — Encounter: Payer: Self-pay | Admitting: Physician Assistant

## 2017-10-24 ENCOUNTER — Other Ambulatory Visit: Payer: Self-pay

## 2017-10-24 VITALS — BP 130/84 | HR 63 | Temp 98.4°F | Resp 20 | Ht 73.7 in | Wt 233.6 lb

## 2017-10-24 DIAGNOSIS — Z1389 Encounter for screening for other disorder: Secondary | ICD-10-CM

## 2017-10-24 DIAGNOSIS — Z13220 Encounter for screening for lipoid disorders: Secondary | ICD-10-CM | POA: Diagnosis not present

## 2017-10-24 DIAGNOSIS — N529 Male erectile dysfunction, unspecified: Secondary | ICD-10-CM | POA: Diagnosis not present

## 2017-10-24 DIAGNOSIS — Z13228 Encounter for screening for other metabolic disorders: Secondary | ICD-10-CM

## 2017-10-24 DIAGNOSIS — Z130 Encounter for screening for diseases of the blood and blood-forming organs and certain disorders involving the immune mechanism: Secondary | ICD-10-CM

## 2017-10-24 DIAGNOSIS — Z0000 Encounter for general adult medical examination without abnormal findings: Secondary | ICD-10-CM | POA: Diagnosis not present

## 2017-10-24 DIAGNOSIS — Z23 Encounter for immunization: Secondary | ICD-10-CM | POA: Diagnosis not present

## 2017-10-24 DIAGNOSIS — Z91013 Allergy to seafood: Secondary | ICD-10-CM

## 2017-10-24 DIAGNOSIS — Z1329 Encounter for screening for other suspected endocrine disorder: Secondary | ICD-10-CM | POA: Diagnosis not present

## 2017-10-24 LAB — POCT URINALYSIS DIP (MANUAL ENTRY)
BILIRUBIN UA: NEGATIVE
BILIRUBIN UA: NEGATIVE mg/dL
GLUCOSE UA: NEGATIVE mg/dL
LEUKOCYTES UA: NEGATIVE
NITRITE UA: NEGATIVE
PH UA: 5.5 (ref 5.0–8.0)
Protein Ur, POC: NEGATIVE mg/dL
Spec Grav, UA: 1.025 (ref 1.010–1.025)
Urobilinogen, UA: 0.2 E.U./dL

## 2017-10-24 LAB — POC MICROSCOPIC URINALYSIS (UMFC): Mucus: ABSENT

## 2017-10-24 MED ORDER — TADALAFIL 5 MG PO TABS: 5.0000 mg | ORAL_TABLET | Freq: Every day | ORAL | 0 refills | Status: DC | PRN

## 2017-10-24 MED ORDER — ZOSTER VAC RECOMB ADJUVANTED 50 MCG/0.5ML IM SUSR: 0.5000 mL | Freq: Once | INTRAMUSCULAR | 0 refills | Status: AC

## 2017-10-24 NOTE — Progress Notes (Signed)
Randy Wilson  MRN: [id number] DOB: Sep 29, 1965  Subjective:  Pt is a 52 y.o. male who presents for annual physical exam. Pt is fasting today.   Exercise: 3 x a week at the gym, lifting weights. Walks at least 10 miles a day. Diet: Mostly grilled foods. Cooks at home mostly. Drinks mostly water. Likes ginger ales.   Sleep: 5 hours a night, works 2nd shift BM: Daily Urinary issues: Having some issues achieving and maintaining erections occasionally.  Has tried Viagra and Cialis in the past.  Viagra gave him a headache.  He really liked Cialis.  Will occasionally have to go the bathroom during the night if he drinks lots of water.  Denies urinary frequency, urgency, urinary dribbling, weak urinary stream, and difficulty starting urination.  He has past medical history of mildly elevated LDL.  No past medical history of hypertension, diabetes, MI, stroke, CHF.  Last EKG 08/2016 was normal.  Former smoker, quit 2007.  Drinks alcohol occasionally, does report slight increase in alcohol consumption over the past few weeks.  Denies chest pain, shortness of breath, diaphoresis, dizziness, lower leg swelling, and headache.   Last vision exam: 8 months ago. Was told to get reading glasses. He got them.   Last colonoscopy: 2018, has to repeat in 11/2017 Vaccinations      Tetanus: 2017      Shingrix: Never  Patient Active Problem List   Diagnosis Date Noted  . 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 Asthma 04/24/2015  . Hx of colonic polyp 11/08/2014  . History of colonic polyps 09/17/2014    Current Outpatient Medications on File Prior to Visit  Medication Sig Dispense Refill  . acetaminophen (TYLENOL) 650 MG CR tablet Take 650 mg by mouth every 8 (eight) hours as needed for pain.    . cetirizine (ZYRTEC) 10 MG chewable tablet Chew 10 mg by mouth daily.     Current Facility-Administered Medications on File Prior to Visit  Medication Dose Route Frequency Provider Last Rate Last Dose  .  0.9 %  sodium chloride infusion  500 mL Intravenous Continuous Doran Stabler, MD        Allergies  Allergen Reactions  . Tramadol Itching  . Shrimp [Shellfish Allergy] Hives    Social History   Socioeconomic History  . Marital status: Married    Spouse name: Kristeen Miss   . Number of children: 3  . Years of education: Not on file  . Highest education level: High school graduate  Occupational History  . Occupation: Recieving Scientist, clinical (histocompatibility and immunogenetics)    Comment: [employer]  Social Needs  . Financial resource strain: Not hard at all  . Food insecurity:    Worry: Never true    Inability: Never true  . Transportation needs:    Medical: No    Non-medical: No  Tobacco Use  . Smoking status: Former Smoker    Packs/day: 1.00    Years: 25.00    Pack years: 25.00    Types: Cigarettes    Last attempt to quit: 01/21/2006    Years since quitting: 11.7  . Smokeless tobacco: Never Used  Substance and Sexual Activity  . Alcohol use: Yes    Alcohol/week: 3.6 oz    Types: 6 Cans of beer per week    Comment: [facility]  . Drug use: No    Types: Marijuana  . Sexual activity: Yes    Comment: with monogamous wife, she takes OCP  Lifestyle  . Physical activity:  Days per week: 4 days    Minutes per session: 60 min  . Stress: Not at all  Relationships  . Social connections:    Talks on phone: More than three times a week    Gets together: More than three times a week    Attends religious service: 1 to 4 times per year    Active member of club or organization: No    Attends meetings of clubs or organizations: Never    Relationship status: Married  Other Topics Concern  . Not on file  Social History Narrative   Pt is from [place].     Past Surgical History:  Procedure Laterality Date  . ANKLE SURGERY Left 2002   bone spurs removed  . COLONOSCOPY    . COLONSCOPY     X 4  . LAPAROSCOPIC ILEOCECECTOMY N/A 11/02/2015   Procedure: LAPAROSCOPIC ILEOCECECTOMY;  Surgeon: Leighton Ruff, MD;  Location: [facility];  Service: General;  Laterality: N/A;  . POLYPECTOMY    . SHOULDER SURGERY Left 2010   bone spurs removed     Family History  Problem Relation Age of Onset  . Asthma Mother 64       asthma attack   . Stroke Brother        Myocardial infarction  . Stroke Brother   . Colon cancer Neg Hx   . Stomach cancer Neg Hx   . Rectal cancer Neg Hx   . Esophageal cancer Neg Hx   . Colon polyps Neg Hx     Review of Systems  Constitutional: Negative for activity change, appetite change, chills, diaphoresis, fatigue, fever and unexpected weight change.  HENT: Negative for congestion, dental problem, drooling, ear discharge, ear pain, facial swelling, hearing loss, mouth sores, nosebleeds, postnasal drip, rhinorrhea, sinus pressure, sinus pain, sneezing, sore throat, tinnitus, trouble swallowing and voice change.   Eyes: Negative for photophobia, pain, discharge, redness, itching and visual disturbance.  Respiratory: Negative for apnea, cough, choking, chest tightness, shortness of breath, wheezing and stridor.   Cardiovascular: Negative for chest pain, palpitations and leg swelling.  Gastrointestinal: Negative for abdominal distention, abdominal pain, anal bleeding, blood in stool, constipation, diarrhea, nausea, rectal pain and vomiting.  Endocrine: Negative for cold intolerance, heat intolerance, polydipsia, polyphagia and polyuria.  Genitourinary: Negative for decreased urine volume, difficulty urinating, discharge, dysuria, enuresis, flank pain, frequency, genital sores, hematuria, penile pain, penile swelling, scrotal swelling, testicular pain and urgency.  Musculoskeletal: Negative for arthralgias, back pain, gait problem, joint swelling, myalgias, neck pain and neck stiffness.  Skin: Negative for color change, pallor, rash and wound.  Allergic/Immunologic: Negative for environmental allergies, food allergies and immunocompromised state.  Neurological: Negative for dizziness, tremors,  seizures, syncope, facial asymmetry, speech difficulty, weakness, light-headedness, numbness and headaches.  Hematological: Negative for adenopathy. Does not bruise/bleed easily.  Psychiatric/Behavioral: Negative for agitation, behavioral problems, confusion, decreased concentration, dysphoric mood, hallucinations, self-injury, sleep disturbance and suicidal ideas. The patient is not nervous/anxious and is not hyperactive.     Objective:  BP 130/84 (BP Location: Left Arm, Patient Position: Sitting, Cuff Size: Large)   Pulse 63   Temp 98.4 F (36.9 C) (Oral)   Resp 20   Ht 6' 1.7" (1.872 m)   Wt 233 lb 9.6 oz (106 kg)   SpO2 97%   BMI 30.24 kg/m   Physical Exam  Constitutional: He is oriented to person, place, and time.  HENT:  Head: Normocephalic and atraumatic.  Right Ear: Hearing, tympanic membrane, external ear  and ear canal normal.  Left Ear: Hearing, tympanic membrane, external ear and ear canal normal.  Nose: Nose normal.  Mouth/Throat: Uvula is midline, oropharynx is clear and moist and mucous membranes are normal. No oropharyngeal exudate.  Eyes: Pupils are equal, round, and reactive to light. Conjunctivae and EOM are normal.  Neck: Trachea normal and normal range of motion.  Cardiovascular: Normal rate, regular rhythm, normal heart sounds and intact distal pulses.  Pulmonary/Chest: Effort normal and breath sounds normal.  Abdominal: Soft. Normal appearance and bowel sounds are normal.  Musculoskeletal: Normal range of motion.  Lymphadenopathy:       Head (right side): No submental, no submandibular, no tonsillar, no preauricular, no posterior auricular and no occipital adenopathy present.       Head (left side): No submental, no submandibular, no tonsillar, no preauricular, no posterior auricular and no occipital adenopathy present.    He has no cervical adenopathy.       Right: No supraclavicular adenopathy present.       Left: No supraclavicular adenopathy present.    Neurological: He is alert and oriented to person, place, and time. He has normal strength and normal reflexes.  Skin: Skin is warm and dry.  Vitals reviewed.   Visual Acuity Screening   Right eye Left eye Both eyes  Without correction: 20/50 20/50 20/30   With correction:       Wt Readings from Last 3 Encounters:  10/24/17 233 lb 9.6 oz (106 kg)  05/09/17 227 lb (103 kg)  03/07/17 237 lb (107.5 kg)   Results for orders placed or performed in visit on 10/24/17 (from the past 24 hour(s))  POCT urinalysis dipstick     Status: Abnormal   Collection Time: 10/24/17 11:48 AM  Result Value Ref Range   Color, UA yellow yellow   Clarity, UA clear clear   Glucose, UA negative negative mg/dL   Bilirubin, UA negative negative   Ketones, POC UA negative negative mg/dL   Spec Grav, UA 1.025 1.010 - 1.025   Blood, UA trace-lysed (A) negative   pH, UA 5.5 5.0 - 8.0   Protein Ur, POC negative negative mg/dL   Urobilinogen, UA 0.2 0.2 or 1.0 E.U./dL   Nitrite, UA Negative Negative   Leukocytes, UA Negative Negative  POCT Microscopic Urinalysis ([facility])     Status: Abnormal   Collection Time: 10/24/17 12:33 PM  Result Value Ref Range   WBC,UR,HPF,POC None None WBC/hpf   RBC,UR,HPF,POC None None RBC/hpf   Bacteria None None, Too numerous to count   Mucus Absent Absent   Epithelial Cells, UR Per Microscopy Few (A) None, Too numerous to count cells/hpf    Assessment and Plan :  Discussed healthy lifestyle, diet, exercise, preventative care, vaccinations, and addressed patient's concerns. Plan for follow up in one year.  Otherwise, plan for specific conditions below.  1. Annual physical exam Await lab results.   2. Screening for metabolic disorder - [id number]  3. Screening for lipid disorders - Lipid panel  4. Screening for thyroid disorder - TSH  5. Screening, anemia, deficiency, iron - CBC with Differential/Platelet  6. Screening for hematuria or proteinuria - POCT urinalysis  dipstick  7. Shellfish allergy - Ambulatory referral to Allergy  8. Erectile dysfunction, unspecified erectile dysfunction type Normal blood pressure.  Labs pending.  Discussed lifestyle modifications such as decreasing alcohol consumption.  Patient would like to attempt trial of Cialis.  Prescription provided.   - Testosterone  - tadalafil (CIALIS) 5 MG tablet;  Take 1 tablet (5 mg total) by mouth daily as needed for erectile dysfunction. May increase to 69m prn. Do not exceed 257min 24 hours.  Dispense: 10 tablet; Refill: 0  9. Need for shingles vaccine - Zoster Vaccine Adjuvanted ([facility]; Inject 0.5 mLs into the muscle once for 1 dose.  Dispense: 0.5 mL; Refill: 0  Side effects, risks, benefits, and alternatives of the medications and treatment plan prescribed today were discussed, and patient expressed understanding of the instructions given. No barriers to understanding were identified. Red flags discussed in detail. Pt expressed understanding regarding what to do in case of emergency/urgent symptoms.    BrTenna DelainePA-C  [facility] 10/24/2017 12:58 PM

## 2017-10-24 NOTE — Patient Instructions (Addendum)
Health Maintenance, Male A healthy lifestyle and preventive care is important for your health and wellness. Ask your health care provider about what schedule of regular examinations is right for you. What should I know about weight and diet? Eat a Healthy Diet  Eat plenty of vegetables, fruits, whole grains, low-fat dairy products, and lean protein.  Do not eat a lot of foods high in solid fats, added sugars, or salt.  Maintain a Healthy Weight Regular exercise can help you achieve or maintain a healthy weight. You should:  Do at least 150 minutes of exercise each week. The exercise should increase your heart rate and make you sweat (moderate-intensity exercise).  Do strength-training exercises at least twice a week.  Watch Your Levels of Cholesterol and Blood Lipids  Have your blood tested for lipids and cholesterol every 5 years starting at 54 years of age. If you are at high risk for heart disease, you should start having your blood tested when you are 54 years old. You may need to have your cholesterol levels checked more often if: ? Your lipid or cholesterol levels are high. ? You are older than 54 years of age. ? You are at high risk for heart disease.  What should I know about cancer screening? Many types of cancers can be detected early and may often be prevented. Lung Cancer  You should be screened every year for lung cancer if: ? You are a current smoker who has smoked for at least 30 years. ? You are a former smoker who has quit within the past 15 years.  Talk to your health care provider about your screening options, when you should start screening, and how often you should be screened.  Colorectal Cancer  Routine colorectal cancer screening usually begins at 54 years of age and should be repeated every 5-10 years until you are 54 years old. You may need to be screened more often if early forms of precancerous polyps or small growths are found. Your health care  provider may recommend screening at an earlier age if you have risk factors for colon cancer.  Your health care provider may recommend using home test kits to check for hidden blood in the stool.  A small camera at the end of a tube can be used to examine your colon (sigmoidoscopy or colonoscopy). This checks for the earliest forms of colorectal cancer.  Prostate and Testicular Cancer  Depending on your age and overall health, your health care provider may do certain tests to screen for prostate and testicular cancer.  Talk to your health care provider about any symptoms or concerns you have about testicular or prostate cancer.  Skin Cancer  Check your skin from head to toe regularly.  Tell your health care provider about any new moles or changes in moles, especially if: ? There is a change in a mole's size, shape, or color. ? You have a mole that is larger than a pencil eraser.  Always use sunscreen. Apply sunscreen liberally and repeat throughout the day.  Protect yourself by wearing long sleeves, pants, a wide-brimmed hat, and sunglasses when outside.  What should I know about heart disease, diabetes, and high blood pressure?  If you are 44-22 years of age, have your blood pressure checked every 3-5 years. If you are 63 years of age or older, have your blood pressure checked every year. You should have your blood pressure measured twice-once when you are at a hospital or clinic, and once  when you are not at a hospital or clinic. Record the average of the two measurements. To check your blood pressure when you are not at a hospital or clinic, you can use: ? An automated blood pressure machine at a pharmacy. ? A home blood pressure monitor.  Talk to your health care provider about your target blood pressure.  If you are between 69-70 years old, ask your health care provider if you should take aspirin to prevent heart disease.  Have regular diabetes screenings by checking your  fasting blood sugar level. ? If you are at a normal weight and have a low risk for diabetes, have this test once every three years after the age of 49. ? If you are overweight and have a high risk for diabetes, consider being tested at a younger age or more often.  A one-time screening for abdominal aortic aneurysm (AAA) by ultrasound is recommended for men aged 64-75 years who are current or former smokers. What should I know about preventing infection? Hepatitis B If you have a higher risk for hepatitis B, you should be screened for this virus. Talk with your health care provider to find out if you are at risk for hepatitis B infection. Hepatitis C Blood testing is recommended for:  Everyone born from [birth year] through [birth year].  Anyone with known risk factors for hepatitis C.  Sexually Transmitted Diseases (STDs)  You should be screened each year for STDs including gonorrhea and chlamydia if: ? You are sexually active and are younger than 54 years of age. ? You are older than 54 years of age and your health care provider tells you that you are at risk for this type of infection. ? Your sexual activity has changed since you were last screened and you are at an increased risk for chlamydia or gonorrhea. Ask your health care provider if you are at risk.  Talk with your health care provider about whether you are at high risk of being infected with HIV. Your health care provider may recommend a prescription medicine to help prevent HIV infection.  What else can I do?  Schedule regular health, dental, and eye exams.  Stay current with your vaccines (immunizations).  Do not use any tobacco products, such as cigarettes, chewing tobacco, and e-cigarettes. If you need help quitting, ask your health care provider.  Limit alcohol intake to no more than 2 drinks per day. One drink equals 12 ounces of beer, 5 ounces of wine, or 1 ounces of hard liquor.  Do not use street drugs.  Do not share  needles.  Ask your health care provider for help if you need support or information about quitting drugs.  Tell your health care provider if you often feel depressed.  Tell your health care provider if you have ever been abused or do not feel safe at home. This information is not intended to replace advice given to you by your health care provider. Make sure you discuss any questions you have with your health care provider. Document Released: 11/10/2007 Document Revised: 01/11/2016 Document Reviewed: 02/15/2015 Elsevier Interactive Patient Education  2018 [employer].  Tadalafil tablets (Cialis) What is this medicine? TADALAFIL (tah DA la fil) is used to treat erection problems in men. It is also used for enlargement of the prostate gland in men, a condition called benign prostatic hyperplasia or BPH. This medicine improves urine flow and reduces BPH symptoms. This medicine can also treat both erection problems and BPH when they occur together.  This medicine may be used for other purposes; ask your health care provider or pharmacist if you have questions. COMMON BRAND NAME(S): Cialis What should I tell my health care provider before I take this medicine? They need to know if you have any of these conditions: -bleeding disorders -eye or vision problems, including a rare inherited eye disease called retinitis pigmentosa -anatomical deformation of the penis, Peyronie's disease, or history of priapism (painful and prolonged erection) -heart disease, angina, a history of heart attack, irregular heart beats, or other heart problems -high or low blood pressure -history of blood diseases, like sickle cell anemia or leukemia -history of stomach bleeding -kidney disease -liver disease -stroke -an unusual or allergic reaction to tadalafil, other medicines, foods, dyes, or preservatives -pregnant or trying to get pregnant -breast-feeding How should I use this medicine? Take this medicine by  mouth with a glass of water. Follow the directions on the prescription label. You may take this medicine with or without meals. When this medicine is used for erection problems, your doctor may prescribe it to be taken once daily or as needed. If you are taking the medicine as needed, you may be able to have sexual activity 30 minutes after taking it and for up to 36 hours after taking it. Whether you are taking the medicine as needed or once daily, you should not take more than one dose per day. If you are taking this medicine for symptoms of benign prostatic hyperplasia (BPH) or to treat both BPH and an erection problem, take the dose once daily at about the same time each day. Do not take your medicine more often than directed. Talk to your pediatrician regarding the use of this medicine in children. Special care may be needed. Overdosage: If you think you have taken too much of this medicine contact a poison control center or emergency room at once. NOTE: This medicine is only for you. Do not share this medicine with others. What if I miss a dose? If you are taking this medicine as needed for erection problems, this does not apply. If you miss a dose while taking this medicine once daily for an erection problem, benign prostatic hyperplasia, or both, take it as soon as you remember, but do not take more than one dose per day. What may interact with this medicine? Do not take this medicine with any of the following medications: -nitrates like amyl nitrite, isosorbide dinitrate, isosorbide mononitrate, nitroglycerin -other medicines for erectile dysfunction like avanafil, sildenafil, vardenafil -other tadalafil products (Adcirca) -riociguat This medicine may also interact with the following medications: -certain drugs for high blood pressure -certain drugs for the treatment of HIV infection or AIDS -certain drugs used for fungal or yeast infections, like fluconazole, itraconazole, ketoconazole, and  voriconazole -certain drugs used for seizures like carbamazepine, phenytoin, and phenobarbital -grapefruit juice -macrolide antibiotics like clarithromycin, erythromycin, troleandomycin -medicines for prostate problems -rifabutin, rifampin or rifapentine This list may not describe all possible interactions. Give your health care provider a list of all the medicines, herbs, non-prescription drugs, or dietary supplements you use. Also tell them if you smoke, drink alcohol, or use illegal drugs. Some items may interact with your medicine. What should I watch for while using this medicine? If you notice any changes in your vision while taking this drug, call your doctor or health care professional as soon as possible. Stop using this medicine and call your health care provider right away if you have a loss of sight in one  or both eyes. Contact your doctor or health care professional right away if the erection lasts longer than 4 hours or if it becomes painful. This may be a sign of serious problem and must be treated right away to prevent permanent damage. If you experience symptoms of nausea, dizziness, chest pain or arm pain upon initiation of sexual activity after taking this medicine, you should refrain from further activity and call your doctor or health care professional as soon as possible. Do not drink alcohol to excess (examples, 5 glasses of wine or 5 shots of whiskey) when taking this medicine. When taken in excess, alcohol can increase your chances of getting a headache or getting dizzy, increasing your heart rate or lowering your blood pressure. Using this medicine does not protect you or your partner against HIV infection (the virus that causes AIDS) or other sexually transmitted diseases. What side effects may I notice from receiving this medicine? Side effects that you should report to your doctor or health care professional as soon as possible: -allergic reactions like skin rash, itching  or hives, swelling of the face, lips, or tongue -breathing problems -changes in hearing -changes in vision -chest pain -fast, irregular heartbeat -prolonged or painful erection -seizures Side effects that usually do not require medical attention (report to your doctor or health care professional if they continue or are bothersome): -back pain -dizziness -flushing -headache -indigestion -muscle aches -nausea -stuffy or runny nose This list may not describe all possible side effects. Call your doctor for medical advice about side effects. You may report side effects to FDA at 1-800-FDA-1088. Where should I keep my medicine? Keep out of the reach of children. Store at room temperature between 15 and 30 degrees C (59 and 86 degrees F). Throw away any unused medicine after the expiration date. NOTE: This sheet is a summary. It may not cover all possible information. If you have questions about this medicine, talk to your doctor, pharmacist, or health care provider.  2018 Elsevier/Gold Standard (2013-10-02 13:15:49)   IF you received an x-ray today, you will receive an invoice from [facility]. Please contact [facility] at [phone] with questions or concerns regarding your invoice.   IF you received labwork today, you will receive an invoice from [facility]. Please contact [facility] at [phone] with questions or concerns regarding your invoice.   Our billing staff will not be able to assist you with questions regarding bills from these companies.  You will be contacted with the lab results as soon as they are available. The fastest way to get your results is to activate your My Chart account. Instructions are located on the last page of this paperwork. If you have not heard from [place] regarding the results in 2 weeks, please contact this office.

## 2017-10-25 LAB — CMP14+EGFR
ALBUMIN: 4.6 g/dL (ref 3.5–5.5)
ALT: 21 IU/L (ref 0–44)
AST: 20 IU/L (ref 0–40)
Albumin/Globulin Ratio: 1.6 (ref 1.2–2.2)
Alkaline Phosphatase: 72 IU/L (ref 39–117)
BILIRUBIN TOTAL: 0.6 mg/dL (ref 0.0–1.2)
BUN / CREAT RATIO: 12 (ref 9–20)
BUN: 13 mg/dL (ref 6–24)
CO2: 19 mmol/L — ABNORMAL LOW (ref 20–29)
Calcium: 9.4 mg/dL (ref 8.7–10.2)
Chloride: 104 mmol/L (ref 96–106)
Creatinine, Ser: 1.13 mg/dL (ref 0.76–1.27)
GFR calc non Af Amer: 73 mL/min/{1.73_m2} (ref >59)
GFR, EST AFRICAN AMERICAN: 85 mL/min/{1.73_m2} (ref >59)
Globulin, Total: 2.9 g/dL (ref 1.5–4.5)
Glucose: 88 mg/dL (ref 65–99)
Potassium: 4.3 mmol/L (ref 3.5–5.2)
Sodium: 140 mmol/L (ref 134–144)
TOTAL PROTEIN: 7.5 g/dL (ref 6.0–8.5)

## 2017-10-25 LAB — TESTOSTERONE: TESTOSTERONE: 325 ng/dL (ref 264–916)

## 2017-10-25 LAB — CBC WITH DIFFERENTIAL/PLATELET
BASOS: 1 %
Basophils Absolute: 0 10*3/uL (ref 0.0–0.2)
EOS (ABSOLUTE): 0.3 10*3/uL (ref 0.0–0.4)
EOS: 5 %
HEMATOCRIT: 42.6 % (ref 37.5–51.0)
HEMOGLOBIN: 14.4 g/dL (ref 13.0–17.7)
Immature Grans (Abs): 0.1 10*3/uL (ref 0.0–0.1)
Immature Granulocytes: 1 %
LYMPHS: 27 %
Lymphocytes Absolute: 1.6 10*3/uL (ref 0.7–3.1)
MCH: 29.2 pg (ref 26.6–33.0)
MCHC: 33.8 g/dL (ref 31.5–35.7)
MCV: 86 fL (ref 79–97)
MONOCYTES: 11 %
Monocytes Absolute: 0.6 10*3/uL (ref 0.1–0.9)
NEUTROS ABS: 3.3 10*3/uL (ref 1.4–7.0)
Neutrophils: 55 %
Platelets: 266 10*3/uL (ref 150–450)
RBC: 4.93 x10E6/uL (ref 4.14–5.80)
RDW: 14.2 % (ref 12.3–15.4)
WBC: 5.9 10*3/uL (ref 3.4–10.8)

## 2017-10-25 LAB — TSH: TSH: 1.83 u[IU]/mL (ref 0.450–4.500)

## 2017-10-25 LAB — LIPID PANEL
Chol/HDL Ratio: 3.6 ratio (ref 0.0–5.0)
Cholesterol, Total: 179 mg/dL (ref 100–199)
HDL: 50 mg/dL (ref >39)
LDL CALC: 115 mg/dL — AB (ref 0–99)
Triglycerides: 72 mg/dL (ref 0–149)
VLDL CHOLESTEROL CAL: 14 mg/dL (ref 5–40)

## 2017-10-28 ENCOUNTER — Encounter: Payer: Self-pay | Admitting: *Deleted

## 2017-11-07 ENCOUNTER — Encounter: Payer: Self-pay | Admitting: Allergy & Immunology

## 2017-11-07 ENCOUNTER — Encounter: Payer: Self-pay | Admitting: Gastroenterology

## 2017-11-07 ENCOUNTER — Ambulatory Visit (INDEPENDENT_AMBULATORY_CARE_PROVIDER_SITE_OTHER): Payer: 59 | Admitting: Allergy & Immunology

## 2017-11-07 VITALS — BP 120/78 | HR 60 | Temp 97.6°F | Resp 16 | Ht 72.5 in | Wt 236.6 lb

## 2017-11-07 DIAGNOSIS — T7800XD Anaphylactic reaction due to unspecified food, subsequent encounter: Secondary | ICD-10-CM | POA: Diagnosis not present

## 2017-11-07 MED ORDER — EPINEPHRINE 0.3 MG/0.3ML IJ SOAJ: 0.3000 mg | Freq: Once | INTRAMUSCULAR | 2 refills | Status: AC

## 2017-11-07 NOTE — Progress Notes (Signed)
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 Anaphylactic shock due to food - ? shellfish - We will get blood testing for shellfish and call you in 1-2 weeks with the results of the testing. - We will send in an [place] (epinephrine) prescription. - They will call you in 1-2 days to confirm your shipping address.  - If the testing is negative, I anticipate that we can introduce this at home in a safe environment (his wife is a Marine scientist). - I did encourage them to keep the [place] close at hand if they try to do this at home. - Risks and benefits reviewed in detail.   2. Return in about 1 year (around 11/08/2018).   Subjective:   Randy Wilson is a 52 y.o. male presenting today for evaluation of  Chief Complaint  Patient presents with  . Urticaria    ?shellfish  . Shortness of Breath    when ate shrimp 1-2 years ago    Randy Wilson has a history of the following: Patient Active Problem List   Diagnosis Date Noted  . 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 Asthma 04/24/2015  . Hx of colonic polyp 11/08/2014  . History of colonic polyps 09/17/2014    History obtained from: chart review and patient.  Randy Wilson was referred by Leonie Douglas, PA-C.     Randy Wilson is a 52 y.o. male presenting for an evaluation of possible shellfish allergy. Symptoms started around 18 months ago when he was eating shrimp. He is eating fish with scales without a problems. He is eating scallops and oysters without a problem. He had this one other time when he went to [employer] where he had a shrimp dish there (early 2017). He had a breakout at that time. Then the second episode occurred months later when they went to [facility] and had indigestion with an all you can eat seafood (2017 or so). He reports that he broke out in hives within 30-60 minutes  of eating the shrimp. He has avoided all shellfish since that time. He has been to [employer] since that time but has avoided ordering shellfish. He has not had any reactions in these settings.    His wife thinks that this might be related to a seasoning. However one episode was Asian spices whereas the others were [place]. However, he has tolerated spices in multiple other forms. He tolerates all of the other major allergens without adverse event. He does not have an EpiPen.   He denies asthma. He has no allergic rhinitis symptoms. He does not get antibiotics often at all. Otherwise, there is no history of other atopic diseases, including drug allergies, stinging insect allergies, or urticaria. There is no significant infectious history. Vaccinations are up to date.    Past Medical History: Patient Active Problem List   Diagnosis Date Noted  . 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 Asthma 04/24/2015  . Hx of colonic polyp 11/08/2014  . History of colonic polyps 09/17/2014    Medication List:  Allergies as of 11/07/2017      Reactions   Tramadol Itching   Shrimp [shellfish Allergy] Hives      Medication List        Accurate as of 11/07/17  9:24 AM. Always use your most recent med list.  acetaminophen 650 MG CR tablet Commonly known as:  TYLENOL Take 650 mg by mouth every 8 (eight) hours as needed for pain.   cetirizine 10 MG tablet Commonly known as:  ZYRTEC Take 10 mg by mouth daily.   tadalafil 5 MG tablet Commonly known as:  CIALIS Take 1 tablet (5 mg total) by mouth daily as needed for erectile dysfunction. May increase to 15m prn. Do not exceed 247min 24 hours.       Birth History: non-contributory.  Developmental History: non-contributory.   Past Surgical History: Past Surgical History:  Procedure Laterality Date  . ANKLE SURGERY Left 2002   bone spurs removed  . COLONOSCOPY    . COLONSCOPY     X 4  . LAPAROSCOPIC ILEOCECECTOMY N/A  11/02/2015   Procedure: LAPAROSCOPIC ILEOCECECTOMY;  Surgeon: AlLeighton RuffMD;  Location: [facility];  Service: General;  Laterality: N/A;  . POLYPECTOMY    . SHOULDER SURGERY Left 2010   bone spurs removed      Family History: Family History  Problem Relation Age of Onset  . Asthma Mother [id number]     asthma attack   . Stroke Brother        Myocardial infarction  . Stroke Brother   . Asthma Sister   . Colon cancer Neg Hx   . Stomach cancer Neg Hx   . Rectal cancer Neg Hx   . Esophageal cancer Neg Hx   . Colon polyps Neg Hx   . Allergic rhinitis Neg Hx   . Angioedema Neg Hx   . Eczema Neg Hx   . Urticaria Neg Hx      Social History: Randy Wilson lives at home with his wife and three girls. He does have a male dog. 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 This [place] in [place]     Review of Systems: a 14-point review of systems is pertinent for what is mentioned in HPI.  Otherwise, all other systems were negative. Constitutional: negative other than that listed in the HPI Eyes: negative other than that listed in the HPI Ears, nose, mouth, throat, and face: negative other than that listed in the HPI Respiratory: negative other than that listed in the HPI Cardiovascular: negative other than that listed in the HPI Gastrointestinal: negative other than that listed in the HPI Genitourinary: negative other than that listed in the HPI Integument: negative other than that listed in the HPI Hematologic: negative other than that listed in the HPI Musculoskeletal: negative other than that listed in the HPI Neurological: negative other than that listed in the HPI Allergy/Immunologic: negative other than that listed in the HPI    Objective:   Blood pressure 120/78, pulse 60, temperature 97.6 F (36.4 C), temperature source Oral, resp. rate 16, height 6' 0.5" (1.842 m), weight 236 lb 9.6 oz (107.3 kg). Body mass index is 31.65 kg/m.   Physical  Exam:  General: Alert, interactive, in no acute distress. Pleasant male.  Eyes: No conjunctival injection bilaterally, no discharge on the right, no discharge on the left and no Horner-Trantas dots present. PERRL bilaterally. EOMI without pain. No photophobia.  Ears: Right TM pearly gray with normal light reflex, Left TM pearly gray with normal light reflex, Right TM intact without perforation and Left TM intact without perforation.  Nose/Throat: External nose within normal limits and septum midline. Turbinates edematous with clear discharge. Posterior oropharynx erythematous without cobblestoning in the posterior oropharynx. Tonsils  2+ without exudates.  Tongue without thrush. Neck: Supple without thyromegaly. Trachea midline. Adenopathy: no enlarged lymph nodes appreciated in the anterior cervical, occipital, axillary, epitrochlear, inguinal, or popliteal regions. Lungs: Clear to auscultation without wheezing, rhonchi or rales. No increased work of breathing. CV: Normal S1/S2. No murmurs. Capillary refill <2 seconds.  Abdomen: Nondistended, nontender. No guarding or rebound tenderness. Bowel sounds present in all fields and hypoactive  Skin: Warm and dry, without lesions or rashes. Extremities:  No clubbing, cyanosis or edema. Neuro:   Grossly intact. No focal deficits appreciated. Responsive to questions.  Diagnostic studies: deferred due to recent antihistamine use    Salvatore Marvel, MD Allergy and [place] [facility] [place]

## 2017-11-07 NOTE — Patient Instructions (Addendum)
1. Anaphylactic shock due to food - ? shellfish - We will get blood testing for shellfish and call you in 1-2 weeks with the results of the testing. - We will send in an [place] (epinephrine) prescription. - They will call you in 1-2 days to confirm your shipping address.   2. Return in about 1 year (around 11/08/2018).   Please inform us of any Emergency Department visits, hospitalizations, or changes in symptoms. Call us before going to the ED for breathing or allergy symptoms since we might be able to fit you in for a sick visit. Feel free to contact us anytime with any questions, problems, or concerns.  It was a pleasure to meet you and your family today!  Websites that have reliable patient information: 1. American Academy of Asthma, Allergy, and Immunology: www.aaaai.org 2. Food Allergy Research and Education (FARE): foodallergy.org 3. Mothers of Asthmatics: http://www.asthmacommunitynetwork.org 4. American College of Allergy, Asthma, and Immunology: MonthlyElectricBill.co.uk   Make sure you are registered to vote!

## 2017-11-07 NOTE — Addendum Note (Signed)
Addended by: Valentina Shaggy on: 11/07/2017 09:47 AM   Modules accepted: Orders

## 2017-11-10 LAB — ALLERGEN PROFILE, SHELLFISH
Clam IgE: 2.74 kU/L — AB
F023-IGE CRAB: 3.14 kU/L — AB
F080-IGE LOBSTER: 0.96 kU/L — AB
F290-IGE OYSTER: 3.24 kU/L — AB
Scallop IgE: 4.43 kU/L — AB
Shrimp IgE: 46.9 kU/L — AB

## 2017-11-15 ENCOUNTER — Encounter: Payer: Self-pay | Admitting: Physician Assistant

## 2017-11-15 ENCOUNTER — Ambulatory Visit (INDEPENDENT_AMBULATORY_CARE_PROVIDER_SITE_OTHER): Payer: 59

## 2017-11-15 ENCOUNTER — Ambulatory Visit (INDEPENDENT_AMBULATORY_CARE_PROVIDER_SITE_OTHER): Payer: 59 | Admitting: Physician Assistant

## 2017-11-15 ENCOUNTER — Ambulatory Visit: Payer: 59

## 2017-11-15 ENCOUNTER — Other Ambulatory Visit: Payer: Self-pay

## 2017-11-15 VITALS — BP 132/68 | HR 76 | Temp 98.1°F | Resp 16 | Ht 72.5 in | Wt 232.4 lb

## 2017-11-15 DIAGNOSIS — G8929 Other chronic pain: Secondary | ICD-10-CM

## 2017-11-15 DIAGNOSIS — M25562 Pain in left knee: Secondary | ICD-10-CM | POA: Diagnosis not present

## 2017-11-15 DIAGNOSIS — M1712 Unilateral primary osteoarthritis, left knee: Secondary | ICD-10-CM | POA: Diagnosis not present

## 2017-11-15 DIAGNOSIS — R9389 Abnormal findings on diagnostic imaging of other specified body structures: Secondary | ICD-10-CM

## 2017-11-15 MED ORDER — NAPROXEN 500 MG PO TABS: 500.0000 mg | ORAL_TABLET | Freq: Two times a day (BID) | ORAL | 0 refills | Status: DC

## 2017-11-15 MED ORDER — DICLOFENAC SODIUM 1 % TD GEL: 2.0000 g | Freq: Four times a day (QID) | TRANSDERMAL | 0 refills | Status: DC

## 2017-11-15 NOTE — Patient Instructions (Addendum)
I recommend using knee brace while bowling. You can experiment with topical voltaren gel or oral naproxen to see what helps for pain. After you do a lot on your knees, make sure you ice, elevate, and rest. If you ever have pain that will not go away with this treatment or worsening swelling, please seek care immediately.    IF you received an x-ray today, you will receive an invoice from [facility]. Please contact [facility] at [phone] with questions or concerns regarding your invoice.   IF you received labwork today, you will receive an invoice from [place]. Please contact [facility] at [phone] with questions or concerns regarding your invoice.   Our billing staff will not be able to assist you with questions regarding bills from these companies.  You will be contacted with the lab results as soon as they are available. The fastest way to get your results is to activate your My Chart account. Instructions are located on the last page of this paperwork. If you have not heard from [place] regarding the results in 2 weeks, please contact this office.      Osteoarthritis Osteoarthritis is a type of arthritis that affects tissue that covers the ends of bones in joints (cartilage). Cartilage acts as a cushion between the bones and helps them move smoothly. Osteoarthritis results when cartilage in the joints gets worn down. Osteoarthritis is sometimes called "wear and tear" arthritis. Osteoarthritis is the most common form of arthritis. It often occurs in older people. It is a condition that gets worse over time (a progressive condition). Joints that are most often affected by this condition are in:  Fingers.  Toes.  Hips.  Knees.  Spine, including neck and lower back.  What are the causes? This condition is caused by age-related wearing down of cartilage that covers the ends of bones. What increases the risk? The following factors may make you more likely to develop  this condition:  Older age.  Being overweight or obese.  Overuse of joints, such as in athletes.  Past injury of a joint.  Past surgery on a joint.  Family history of osteoarthritis.  What are the signs or symptoms? The main symptoms of this condition are pain, swelling, and stiffness in the joint. The joint may lose its shape over time. Small pieces of bone or cartilage may break off and float inside of the joint, which may cause more pain and damage to the joint. Small deposits of bone (osteophytes) may grow on the edges of the joint. Other symptoms may include:  A grating or scraping feeling inside the joint when you move it.  Popping or creaking sounds when you move.  Symptoms may affect one or more joints. Osteoarthritis in a major joint, such as your knee or hip, can make it painful to walk or exercise. If you have osteoarthritis in your hands, you might not be able to grip items, twist your hand, or control small movements of your hands and fingers (fine motor skills). How is this diagnosed? This condition may be diagnosed based on:  Your medical history.  A physical exam.  Your symptoms.  X-rays of the affected joint(s).  Blood tests to rule out other types of arthritis.  How is this treated? There is no cure for this condition, but treatment can help to control pain and improve joint function. Treatment plans may include:  A prescribed exercise program that allows for rest and joint relief. You may work with a physical therapist.  A weight control plan.  Pain relief techniques, such as: ? Applying heat and cold to the joint. ? Electric pulses delivered to nerve endings under the skin (transcutaneous electrical nerve stimulation, or TENS). ? Massage. ? Certain nutritional supplements.  NSAIDs or prescription medicines to help relieve pain.  Medicine to help relieve pain and inflammation (corticosteroids). This can be given by mouth (orally) or as an  injection.  Assistive devices, such as a brace, wrap, splint, specialized glove, or cane.  Surgery, such as: ? An osteotomy. This is done to reposition the bones and relieve pain or to remove loose pieces of bone and cartilage. ? Joint replacement surgery. You may need this surgery if you have very bad (advanced) osteoarthritis.  Follow these instructions at home: Activity  Rest your affected joints as directed by your health care provider.  Do not drive or use heavy machinery while taking prescription pain medicine.  Exercise as directed. Your health care provider or physical therapist may recommend specific types of exercise, such as: ? Strengthening exercises. These are done to strengthen the muscles that support joints that are affected by arthritis. They can be performed with weights or with exercise bands to add resistance. ? Aerobic activities. These are exercises, such as brisk walking or water aerobics, that get your heart pumping. ? Range-of-motion activities. These keep your joints easy to move. ? Balance and agility exercises. Managing pain, stiffness, and swelling  If directed, apply heat to the affected area as often as told by your health care provider. Use the heat source that your health care provider recommends, such as a moist heat pack or a heating pad. ? If you have a removable assistive device, remove it as told by your health care provider. ? Place a towel between your skin and the heat source. If your health care provider tells you to keep the assistive device on while you apply heat, place a towel between the assistive device and the heat source. ? Leave the heat on for 20-30 minutes. ? Remove the heat if your skin turns bright red. This is especially important if you are unable to feel pain, heat, or cold. You may have a greater risk of getting burned.  If directed, put ice on the affected joint: ? If you have a removable assistive device, remove it as told by  your health care provider. ? Put ice in a plastic bag. ? Place a towel between your skin and the bag. If your health care provider tells you to keep the assistive device on during icing, place a towel between the assistive device and the bag. ? Leave the ice on for 20 minutes, 2-3 times a day. General instructions  Take over-the-counter and prescription medicines only as told by your health care provider.  Maintain a healthy weight. Follow instructions from your health care provider for weight control. These may include dietary restrictions.  Do not use any products that contain nicotine or tobacco, such as cigarettes and e-cigarettes. These can delay bone healing. If you need help quitting, ask your health care provider.  Use assistive devices as directed by your health care provider.  Keep all follow-up visits as told by your health care provider. This is important. Where to find more information:  [employer] of Arthritis and Musculoskeletal and Skin Diseases: www.niams.SouthExposed.es  [employer] on Aging: http://kim-miller.com/  American College of Rheumatology: www.rheumatology.org Contact a health care provider if:  Your skin turns red.  You develop a rash.  You  have pain that gets worse.  You have a fever along with joint or muscle aches. Get help right away if:  You lose a lot of weight.  You suddenly lose your appetite.  You have night sweats. Summary  Osteoarthritis is a type of arthritis that affects tissue covering the ends of bones in joints (cartilage).  This condition is caused by age-related wearing down of cartilage that covers the ends of bones.  The main symptom of this condition is pain, swelling, and stiffness in the joint.  There is no cure for this condition, but treatment can help to control pain and improve joint function. This information is not intended to replace advice given to you by your health care provider. Make sure you discuss any  questions you have with your health care provider. Document Released: 05/14/2005 Document Revised: 01/16/2016 Document Reviewed: 01/16/2016 Elsevier Interactive Patient Education  [employer].
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
## 2017-11-20 ENCOUNTER — Telehealth: Payer: Self-pay | Admitting: Allergy & Immunology

## 2017-11-20 NOTE — Telephone Encounter (Signed)
Pt called and wanted to see if lab results had come in. [phone].

## 2017-11-20 NOTE — Telephone Encounter (Signed)
Spoke to patient advised of lab results patient does have [place] on hand. Patient verbalized understanding.

## 2017-11-21 ENCOUNTER — Telehealth: Payer: Self-pay

## 2017-11-21 NOTE — Telephone Encounter (Signed)
-----   Message from Leonie Douglas, PA-C sent at 11/21/2017 11:17 AM EDT ----- Please call patient and remind him that he does need to come in for an x-ray of his femur.  Please let me know when he is wanting to have this done. ----- Message ----- From: SYSTEM Sent: 11/20/2017  12:07 AM To: Leonie Douglas, PA-C

## 2017-11-21 NOTE — Telephone Encounter (Signed)
LMOVM with Brittany's message to come in for xray of femur.

## 2017-12-06 ENCOUNTER — Ambulatory Visit (INDEPENDENT_AMBULATORY_CARE_PROVIDER_SITE_OTHER): Payer: 59

## 2017-12-06 ENCOUNTER — Ambulatory Visit: Payer: 59

## 2017-12-06 ENCOUNTER — Other Ambulatory Visit: Payer: Self-pay | Admitting: Physician Assistant

## 2017-12-06 DIAGNOSIS — R9389 Abnormal findings on diagnostic imaging of other specified body structures: Secondary | ICD-10-CM | POA: Diagnosis not present

## 2017-12-06 DIAGNOSIS — M79652 Pain in left thigh: Secondary | ICD-10-CM | POA: Diagnosis not present

## 2017-12-06 NOTE — Progress Notes (Signed)
Orders Placed This Encounter  Procedures  . DG FEMUR MIN 2 VIEWS LEFT    Standing Status:   Future    Standing Expiration Date:   02/05/2019    Order Specific Question:   Reason for Exam (SYMPTOM  OR DIAGNOSIS REQUIRED)    Answer:   Incompletely imaged bone lesion in the distal femoral shaft    Order Specific Question:   Preferred imaging location?    Answer:   External

## 2017-12-07 ENCOUNTER — Telehealth: Payer: Self-pay | Admitting: Physician Assistant

## 2017-12-07 NOTE — Telephone Encounter (Signed)
Attempted to contact patient to discuss lab results.  No answer.  Will attempt to call back later.

## 2017-12-11 ENCOUNTER — Encounter: Payer: Self-pay | Admitting: Physician Assistant

## 2017-12-11 ENCOUNTER — Other Ambulatory Visit: Payer: Self-pay | Admitting: Physician Assistant

## 2017-12-11 ENCOUNTER — Telehealth: Payer: Self-pay | Admitting: Physician Assistant

## 2017-12-11 DIAGNOSIS — R9389 Abnormal findings on diagnostic imaging of other specified body structures: Secondary | ICD-10-CM

## 2017-12-11 NOTE — Progress Notes (Signed)
Orders Placed This Encounter  Procedures  . MR FEMUR LEFT W WO CONTRAST    Standing Status:   Future    Standing Expiration Date:   02/12/2019    Order Specific Question:   If indicated for the ordered procedure, I authorize the administration of contrast media per Radiology protocol    Answer:   Yes    Order Specific Question:   What is the patient's sedation requirement?    Answer:   No Sedation    Order Specific Question:   Does the patient have a pacemaker or implanted devices?    Answer:   No    Order Specific Question:   Radiology Contrast Protocol - do NOT remove file path    Answer:   [email or web address]    Order Specific Question:   Preferred imaging location?    Answer:   External

## 2017-12-11 NOTE — Telephone Encounter (Signed)
Copied from [place] [id number]. Topic: Quick Communication - Office Called Patient >> Dec 11, 2017 11:08 AM Cecelia Byars, NT wrote: Reason for CRM: Patient said he missed a call from the practice in regards to his x rays and would like a call back as possible [phone]

## 2017-12-11 NOTE — Telephone Encounter (Signed)
I talked with pt about  x-ray is most consistent with a benign bone tumor.  MRI has been ordered to ensure that this is benign and not a concern for any type of malignant lesion. That pt will be contacted within the next 2 weeks. Pt states understanding and say's tell [name] hello.

## 2017-12-12 ENCOUNTER — Ambulatory Visit (AMBULATORY_SURGERY_CENTER): Payer: Self-pay

## 2017-12-12 ENCOUNTER — Other Ambulatory Visit: Payer: Self-pay

## 2017-12-12 VITALS — Ht 74.0 in | Wt 230.2 lb

## 2017-12-12 DIAGNOSIS — Z86010 Personal history of colonic polyps: Secondary | ICD-10-CM

## 2017-12-12 MED ORDER — NA SULFATE-K SULFATE-MG SULF 17.5-3.13-1.6 GM/177ML PO SOLN: 1.0000 | Freq: Once | ORAL | 0 refills | Status: AC

## 2017-12-12 NOTE — Progress Notes (Signed)
Denies allergies to eggs or soy products. Denies complication of anesthesia or sedation. Denies use of weight loss medication. Denies use of O2.   Emmi instructions declined.  

## 2017-12-13 ENCOUNTER — Encounter: Payer: Self-pay | Admitting: Gastroenterology

## 2017-12-25 ENCOUNTER — Ambulatory Visit (AMBULATORY_SURGERY_CENTER): Payer: 59 | Admitting: Gastroenterology

## 2017-12-25 ENCOUNTER — Encounter: Payer: Self-pay | Admitting: Gastroenterology

## 2017-12-25 VITALS — BP 106/67 | HR 62 | Temp 98.6°F | Resp 11 | Ht 74.0 in | Wt 230.0 lb

## 2017-12-25 DIAGNOSIS — Z86010 Personal history of colonic polyps: Secondary | ICD-10-CM | POA: Diagnosis not present

## 2017-12-25 DIAGNOSIS — K633 Ulcer of intestine: Secondary | ICD-10-CM | POA: Diagnosis not present

## 2017-12-25 DIAGNOSIS — K5000 Crohn's disease of small intestine without complications: Secondary | ICD-10-CM | POA: Diagnosis not present

## 2017-12-25 MED ORDER — SODIUM CHLORIDE 0.9 % IV SOLN: 500.0000 mL | Freq: Once | INTRAVENOUS | Status: DC

## 2017-12-25 NOTE — Patient Instructions (Signed)
Biopsies taken to rule out Crohn's.   YOU HAD AN ENDOSCOPIC PROCEDURE TODAY AT [facility]:   Refer to the procedure report that was given to you for any specific questions about what was found during the examination.  If the procedure report does not answer your questions, please call your gastroenterologist to clarify.  If you requested that your care partner not be given the details of your procedure findings, then the procedure report has been included in a sealed envelope for you to review at your convenience later.  YOU SHOULD EXPECT: Some feelings of bloating in the abdomen. Passage of more gas than usual.  Walking can help get rid of the air that was put into your GI tract during the procedure and reduce the bloating. If you had a lower endoscopy (such as a colonoscopy or flexible sigmoidoscopy) you may notice spotting of blood in your stool or on the toilet paper. If you underwent a bowel prep for your procedure, you may not have a normal bowel movement for a few days.  Please Note:  You might notice some irritation and congestion in your nose or some drainage.  This is from the oxygen used during your procedure.  There is no need for concern and it should clear up in a day or so.  SYMPTOMS TO REPORT IMMEDIATELY:   Following lower endoscopy (colonoscopy or flexible sigmoidoscopy):  Excessive amounts of blood in the stool  Significant tenderness or worsening of abdominal pains  Swelling of the abdomen that is new, acute  Fever of 100F or higher   For urgent or emergent issues, a gastroenterologist can be reached at any hour by calling [phone].   DIET:  We do recommend a small meal at first, but then you may proceed to your regular diet.  Drink plenty of fluids but you should avoid alcoholic beverages for 24 hours.  ACTIVITY:  You should plan to take it easy for the rest of today and you should NOT DRIVE or use heavy machinery until tomorrow (because of the  sedation medicines used during the test).    FOLLOW UP: Our staff will call the number listed on your records the next business day following your procedure to check on you and address any questions or concerns that you may have regarding the information given to you following your procedure. If we do not reach you, we will leave a message.  However, if you are feeling well and you are not experiencing any problems, there is no need to return our call.  We will assume that you have returned to your regular daily activities without incident.  If any biopsies were taken you will be contacted by phone or by letter within the next 1-3 weeks.  Please call us at [phone] if you have not heard about the biopsies in 3 weeks.    SIGNATURES/CONFIDENTIALITY: You and/or your care partner have signed paperwork which will be entered into your electronic medical record.  These signatures attest to the fact that that the information above on your After Visit Summary has been reviewed and is understood.  Full responsibility of the confidentiality of this discharge information lies with you and/or your care-partner.

## 2017-12-25 NOTE — Progress Notes (Signed)
A/ox3, pleased with MAC, report to RN 

## 2017-12-25 NOTE — Op Note (Signed)
[place] Patient Name: Randy Wilson Procedure Date: 12/25/2017 1:43 PM MRN: [id number] Endoscopist: [name] , MD Age: 52 Referring MD:  Date of Birth: [birth year] Gender: Male Account #: [id number] Procedure:                Colonoscopy Indications:              Follow-up for history of inflammatory polyps in the                            colon - resected in 2017. colonoscopy 11/2016 with a                            few shwllow ulcers at the anastomosis. This exam is                            to reassess ulcers. Medicines:                Monitored Anesthesia Care Procedure:                Pre-Anesthesia Assessment:                           - Prior to the procedure, a History and Physical                            was performed, and patient medications and                            allergies were reviewed. The patient's tolerance of                            previous anesthesia was also reviewed. The risks                            and benefits of the procedure and the sedation                            options and risks were discussed with the patient.                            All questions were answered, and informed consent                            was obtained. Prior Anticoagulants: The patient has                            taken no previous anticoagulant or antiplatelet                            agents. ASA Grade Assessment: II - A patient with                            mild systemic disease. After reviewing the risks  and benefits, the patient was deemed in                            satisfactory condition to undergo the procedure.                           After obtaining informed consent, the colonoscope                            was passed under direct vision. Throughout the                            procedure, the patient's blood pressure, pulse, and                            oxygen saturations were monitored  continuously. The                            Colonoscope was introduced through the anus and                            advanced to the the ileocolonic anastomosis and                            neo-terminal ileum. The colonoscopy was performed                            without difficulty. The patient tolerated the                            procedure well. The quality of the bowel                            preparation was excellent. The rectum and                            Neo-terminal ileum and anastomosis were                            photographed. Scope In: 1:53:03 PM Scope Out: 2:07:16 PM Scope Withdrawal Time: 0 hours 10 minutes 53 seconds  Total Procedure Duration: 0 hours 14 minutes 13 seconds  Findings:                 The perianal and digital rectal examinations were                            normal.                           The ileal surgical anastomosis contained multiple                            ulcers. No bleeding was present. Biopsies were  taken with a cold forceps for histology. The                            neo-terminal ileum had a few tiny shallow ulcers.                            It was difficult to deeply intubate due to                            eccentric location and scope looping.                           Internal hemorrhoids were found. The hemorrhoids                            were Grade I (internal hemorrhoids that do not                            prolapse).                           The exam was otherwise without abnormality on                            direct and retroflexion views. Complications:            No immediate complications. Estimated Blood Loss:     Estimated blood loss was minimal. Impression:               - Multiple ulcers at the ileal surgical                            anastomosis. Biopsied.                           - Internal hemorrhoids.                           - The examination was otherwise  normal on direct                            and retroflexion views.                           The ulceration is more impressive than a year ago.                            Given that , and the pathology of the initial large                            inflammatory polyp, this appears to be Crohn's                            disease. Recommendation:           - Patient has a contact number available for  emergencies. The signs and symptoms of potential                            delayed complications were discussed with the                            patient. Return to normal activities tomorrow.                            Written discharge instructions were provided to the                            patient.                           - Resume previous diet.                           - Continue present medications.                           - Await pathology results, then office visit to                            review and discuss treatment.                           - Repeat colonoscopy is recommended. The                            colonoscopy date will be determined after pathology                            results from today's exam become available for                            review, and depending on patient's clinical course. Henry L. Loletha Carrow, MD 12/25/2017 2:23:56 PM This report has been signed electronically.

## 2017-12-25 NOTE — Progress Notes (Signed)
Pt's states no medical or surgical changes since previsit or office visit. 

## 2017-12-25 NOTE — Progress Notes (Signed)
Called to room to assist during endoscopic procedure.  Patient ID and intended procedure confirmed with present staff. Received instructions for my participation in the procedure from the performing physician.  

## 2017-12-26 ENCOUNTER — Telehealth: Payer: Self-pay

## 2017-12-26 NOTE — Telephone Encounter (Signed)
  Follow up Call-  Call back number 12/25/2017 12/03/2016 09/07/2015  Post procedure Call Back phone  # [phone]  Permission to leave phone message Yes Yes Yes  Some recent data might be hidden     Patient questions:  Do you have a fever, pain , or abdominal swelling? No. Pain Score  0 *  Have you tolerated food without any problems? Yes.    Have you been able to return to your normal activities? Yes.    Do you have any questions about your discharge instructions: Diet   No. Medications  No. Follow up visit  No.  Do you have questions or concerns about your Care? No.  Actions: * If pain score is 4 or above: No action needed, pain <4.

## 2017-12-26 NOTE — Telephone Encounter (Signed)
Follow up call, number identifier, left a voicemail.

## 2018-01-06 ENCOUNTER — Ambulatory Visit
Admission: RE | Admit: 2018-01-06 | Discharge: 2018-01-06 | Disposition: A | Discharge status: Home / Self Care | Payer: 59 | Source: Ambulatory Visit | Attending: Physician Assistant | Admitting: Physician Assistant

## 2018-01-06 DIAGNOSIS — R9389 Abnormal findings on diagnostic imaging of other specified body structures: Secondary | ICD-10-CM

## 2018-01-06 DIAGNOSIS — R2242 Localized swelling, mass and lump, left lower limb: Secondary | ICD-10-CM | POA: Diagnosis not present

## 2018-01-06 MED ORDER — GADOBENATE DIMEGLUMINE 529 MG/ML IV SOLN
20.0000 mL | Freq: Once | INTRAVENOUS | Status: AC | PRN
Start: 2018-01-06 — End: 2018-01-06
  Administered 2018-01-06: 20 mL via INTRAVENOUS

## 2018-01-07 ENCOUNTER — Other Ambulatory Visit: Payer: Self-pay

## 2018-01-07 DIAGNOSIS — K529 Noninfective gastroenteritis and colitis, unspecified: Secondary | ICD-10-CM

## 2018-01-08 ENCOUNTER — Ambulatory Visit: Payer: 59 | Admitting: Physician Assistant

## 2018-01-08 NOTE — Progress Notes (Deleted)
     MRN: [id number] DOB: 1965-08-07  Subjective:   Randy Wilson is a 52 y.o. male presenting for chief complaint of No chief complaint on file. .  Reports *** history of {URI Symptoms :210800001}, {Systemic Symptoms:[phone]}. Has tried *** relief. Denies ***fever, {URI Symptoms :210800001}, {Systemic Symptoms:[phone]}. Has *** had *** sick contact with ***. *** history of seasonal allergies, history of asthma. Patient *** flu shot this season. *** smoking, *** alcohol. Denies any other aggravating or relieving factors, no other questions or concerns.  Randy Wilson has a current medication list which includes the following prescription(s): acetaminophen, cetirizine, diclofenac sodium, naproxen, and tadalafil, and the following Facility-Administered Medications: sodium chloride. Also is allergic to tramadol and shrimp [shellfish allergy].  Randy Wilson  has a past medical history of Allergy, Asthma (AS CHILD), Chronic enteritis, Hemorrhoids, Inflammatory polyps of colon ([place]), Rectal bleeding (NONE IN LAST 3 MONTHS), and Shoulder pain. Also  has a past surgical history that includes Shoulder surgery (Left, 2010); Ankle surgery (Left, 2002); COLONSCOPY; Laparoscopic ileocecectomy (N/A, 11/02/2015); Colonoscopy; and Polypectomy.   Objective:   Vitals: There were no vitals taken for this visit.  Physical Exam  No results found for this or any previous visit (from the past 24 hour(s)).  Assessment and Plan :  There are no diagnoses linked to this encounter.  Tenna Delaine, PA-C  [facility] 01/08/2018 7:27 AM

## 2018-01-09 ENCOUNTER — Other Ambulatory Visit: Payer: Self-pay | Admitting: Physician Assistant

## 2018-01-09 DIAGNOSIS — D169 Benign neoplasm of bone and articular cartilage, unspecified: Secondary | ICD-10-CM

## 2018-01-09 NOTE — Progress Notes (Signed)
Patient advised Voiced understanding

## 2018-01-09 NOTE — Progress Notes (Signed)
Please call patient.  I sent him a my chart message regarding his MRI results.  I have placed a referral to orthopedics for follow-up due to the size of the benign lesion.  I know he has been seen by [facility] before but I just wanted to make sure that they get this referral regarding this lesion.  Please let me know if he has any questions or concerns.

## 2018-01-15 ENCOUNTER — Inpatient Hospital Stay: Admission: RE | Admit: 2018-01-15 | Payer: 59 | Source: Ambulatory Visit

## 2018-01-20 ENCOUNTER — Other Ambulatory Visit: Payer: 59

## 2018-01-23 ENCOUNTER — Ambulatory Visit (INDEPENDENT_AMBULATORY_CARE_PROVIDER_SITE_OTHER)
Admission: RE | Admit: 2018-01-23 | Discharge: 2018-01-23 | Disposition: A | Discharge status: Home / Self Care | Payer: 59 | Source: Ambulatory Visit | Attending: Gastroenterology | Admitting: Gastroenterology

## 2018-01-23 DIAGNOSIS — K529 Noninfective gastroenteritis and colitis, unspecified: Secondary | ICD-10-CM | POA: Diagnosis not present

## 2018-01-23 DIAGNOSIS — K6389 Other specified diseases of intestine: Secondary | ICD-10-CM | POA: Diagnosis not present

## 2018-01-23 MED ORDER — IOPAMIDOL (ISOVUE-300) INJECTION 61%
100.0000 mL | Freq: Once | INTRAVENOUS | Status: AC | PRN
  Administered 2018-01-23: 100 mL via INTRAVENOUS

## 2018-03-07 ENCOUNTER — Other Ambulatory Visit: Payer: 59

## 2018-03-07 ENCOUNTER — Ambulatory Visit (INDEPENDENT_AMBULATORY_CARE_PROVIDER_SITE_OTHER): Payer: 59 | Admitting: Gastroenterology

## 2018-03-07 ENCOUNTER — Encounter: Payer: Self-pay | Admitting: Gastroenterology

## 2018-03-07 VITALS — BP 124/84 | HR 62 | Ht 74.0 in | Wt 230.6 lb

## 2018-03-07 DIAGNOSIS — K5000 Crohn's disease of small intestine without complications: Secondary | ICD-10-CM | POA: Diagnosis not present

## 2018-03-07 NOTE — Progress Notes (Signed)
[facility] Progress Note  Chief Complaint: Small bowel Crohn's disease  Subjective  History:  Mr. Jeanbaptiste follows up after his recent colonoscopy.  It showed worsened ulcerations at the ileocolonic anastomosis, the neoterminal ileum was difficult to intubate and evaluate with the scope due to its eccentric position and scope looping.  Overall, this appeared to be slowly worsening Crohn's disease. Currently, he continues to feel well with no chronic abdominal pain, diarrhea, nausea, vomiting or weight loss.  ROS: Cardiovascular:  no chest pain Respiratory: no dyspnea  The patient's Past Medical, Family and Social History were reviewed and are on file in the EMR.  Objective:  Med list reviewed  Current Outpatient Medications:  .  acetaminophen (TYLENOL) 650 MG CR tablet, Take 650 mg by mouth every 8 (eight) hours as needed for pain., Disp: , Rfl:  .  cetirizine (ZYRTEC) 10 MG tablet, Take 10 mg by mouth daily., Disp: , Rfl:  .  diclofenac sodium (VOLTAREN) 1 % GEL, Apply 2 g topically 4 (four) times daily., Disp: 100 g, Rfl: 0  Current Facility-Administered Medications:  .  0.9 %  sodium chloride infusion, 500 mL, Intravenous, Once, Danis, Estill Cotta III, MD   Vital signs in last 24 hrs: Vitals:   03/07/18 1605  BP: 124/84  Pulse: 62  SpO2: 96%    Physical Exam  He looks well  HEENT: sclera anicteric, oral mucosa moist without lesions  Neck: supple, no thyromegaly, JVD or lymphadenopathy  Cardiac: RRR without murmurs, S1S2 heard, no peripheral edema  Pulm: clear to auscultation bilaterally, normal RR and effort noted  Abdomen: soft, no tenderness, with active bowel sounds. No guarding or palpable hepatosplenomegaly.  Skin; warm and dry, no jaundice or rash  Recent Labs:  Anastomosis biopsies showing chronic inflammation  CT enterography showed mucosal thickening the anastomosis and in the neoterminal ileum.  There were a few mildly enlarged reactive lymph  nodes adjacent to that area.  No stenosis or obstruction.  The surgical pathology report from June 2017 showed a 3 cm inflammatory polyp with transmural ulceration and fibrosis.  @ASSESSMENTPLANBEGIN @ Assessment: Encounter Diagnosis  Name Primary?  . Crohn's disease of small intestine without complication ([place]) Yes   This has been an unusual presentation for small bowel Crohn's with a giant inflammatory polyp at the ileocecal valve.  He feels like in retrospect it probably was causing some constipation/low-grade obstructive symptoms, since his bowel habits are now daily versus every 2 or 3 days before.  Nevertheless, he has no symptoms at present but needs therapy since his endoscopically worsening over the last year.   Plan: We discussed the nature of Crohn's disease in his usual need for chronic therapy.  We also discussed uncertainties about what inflammatory molecules may be most active patients.  Overall, I think anti-TNF therapy would be most appropriate for him.  I would like him to consider Humira, and gave him some reading material about it.  We discussed how it is administered, and also the risks such as immune suppression and less so cancer such as small bowel lymphoma.  He would like to give it some further consideration, and I have sent him for TB testing as well as hepatitis A and B serologies to see if he needs hepatitis vaccination.  If he decides to pursue therapy, he also would need pneumococcal vaccine and shingles vaccine.  We will contact him with test results and work on approval for Humira if he is agreeable.  Total time  25 minutes, over half spent face-to-face with patient in counseling and coordination of care.   Nelida Meuse III

## 2018-03-07 NOTE — Patient Instructions (Signed)
If you are age 54 or older, your body mass index should be between 23-30. Your Body mass index is 29.61 kg/m. If this is out of the aforementioned range listed, please consider follow up with your Primary Care Provider.  If you are age 54 or younger, your body mass index should be between 19-25. Your Body mass index is 29.61 kg/m. If this is out of the aformentioned range listed, please consider follow up with your Primary Care Provider.   Your provider has requested that you go to the basement level for lab work before leaving today. Press "B" on the elevator. The lab is located at the first door on the left as you exit the elevator.  It was a pleasure to see you today!  Dr. Loletha Carrow

## 2018-03-09 LAB — HEPATITIS B SURFACE ANTIGEN: Hepatitis B Surface Ag: NONREACTIVE

## 2018-03-09 LAB — QUANTIFERON-TB GOLD PLUS
NIL: 0.05 IU/mL
QuantiFERON-TB Gold Plus: NEGATIVE
TB1-NIL: 0.03 [IU]/mL
TB2-NIL: 0 [IU]/mL

## 2018-03-09 LAB — HEPATITIS A ANTIBODY, TOTAL: HEPATITIS A AB,TOTAL: NONREACTIVE

## 2018-03-09 LAB — HEPATITIS B SURFACE ANTIBODY,QUALITATIVE: Hep B S Ab: NONREACTIVE

## 2018-03-17 ENCOUNTER — Ambulatory Visit: Payer: Self-pay

## 2018-03-17 ENCOUNTER — Telehealth: Payer: Self-pay | Admitting: Gastroenterology

## 2018-03-17 ENCOUNTER — Telehealth: Payer: Self-pay | Admitting: Physician Assistant

## 2018-03-17 NOTE — Telephone Encounter (Signed)
Copied from [place] [id number]. Topic: Quick Communication - See Telephone Encounter >> Mar 17, 2018  2:03 PM Parke Poisson wrote: CRM for notification. See Telephone encounter for: 03/17/18.   Pt states that he is needing to get several shot before he can start on humira shingles PCV 13 Hepatitis A and B Flu shot

## 2018-03-17 NOTE — Telephone Encounter (Signed)
Called pt left VM to call office. Per office Hassan Rowan [facility] advised to schedule appointment with PCP. [facility] agent can schedule appointment with Ms Adolm Joseph for vaccines. Pt can walk in for flu vaccine.

## 2018-03-27 ENCOUNTER — Ambulatory Visit: Payer: 59 | Admitting: Physician Assistant

## 2018-03-27 DIAGNOSIS — M79671 Pain in right foot: Secondary | ICD-10-CM | POA: Diagnosis not present

## 2018-03-27 DIAGNOSIS — M19071 Primary osteoarthritis, right ankle and foot: Secondary | ICD-10-CM | POA: Diagnosis not present

## 2018-03-28 ENCOUNTER — Encounter: Payer: Self-pay | Admitting: Physician Assistant

## 2018-03-28 ENCOUNTER — Ambulatory Visit (INDEPENDENT_AMBULATORY_CARE_PROVIDER_SITE_OTHER): Payer: 59 | Admitting: Physician Assistant

## 2018-03-28 VITALS — BP 130/74 | HR 63 | Temp 98.0°F | Resp 16 | Ht 74.0 in | Wt 236.8 lb

## 2018-03-28 DIAGNOSIS — Z23 Encounter for immunization: Secondary | ICD-10-CM

## 2018-03-28 DIAGNOSIS — Z8739 Personal history of other diseases of the musculoskeletal system and connective tissue: Secondary | ICD-10-CM

## 2018-03-28 DIAGNOSIS — K5010 Crohn's disease of large intestine without complications: Secondary | ICD-10-CM | POA: Diagnosis not present

## 2018-03-28 MED ORDER — ZOSTER VAC RECOMB ADJUVANTED 50 MCG/0.5ML IM SUSR: 0.5000 mL | Freq: Once | INTRAMUSCULAR | 0 refills | Status: AC

## 2018-03-28 MED ORDER — INDOMETHACIN 50 MG PO CAPS: ORAL_CAPSULE | ORAL | 0 refills | Status: DC

## 2018-03-28 NOTE — Progress Notes (Signed)
Randy Wilson  MRN: [id number] DOB: 05/26/1966  Subjective:  Randy Wilson is a 52 y.o. male seen in office today for a chief complaint of newly dx of Crohn's disease.  Recently diagnosed by [facility].  Was told that he needed to update himself on all vaccines before he starts Humira.  Was given a list by Dr. Loletha Carrow of vaccines needed.  Is going to get Twinrix at their office.  Needs Rx for Shingrix vaccine.  Needs pneumococcal 13 today and then 23 in 2 months.  Also needs flu vaccine.  He is feeling overall well today.  Denies fever, chills, nausea, vomiting, abdominal pain, nasal congestion, sore throat, and cough.  Was diagnosed with gout yesterday in urgent care.  Given Rx for prednisone taper.  This is his first incident of gout.  Has been drinking more alcohol and eating more fish recently.  His toe pain is improving with prednisone use.  No other questions or concerns today.  Review of Systems  Per HPI  Patient Active Problem List   Diagnosis Date Noted  . 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 Asthma 04/24/2015  . Hx of colonic polyp 11/08/2014  . History of colonic polyps 09/17/2014    Current Outpatient Medications on File Prior to Visit  Medication Sig Dispense Refill  . acetaminophen (TYLENOL) 650 MG CR tablet Take 650 mg by mouth every 8 (eight) hours as needed for pain.    . cetirizine (ZYRTEC) 10 MG tablet Take 10 mg by mouth daily.    . diclofenac sodium (VOLTAREN) 1 % GEL Apply 2 g topically 4 (four) times daily. (Patient not taking: Reported on 03/28/2018) 100 g 0   Current Facility-Administered Medications on File Prior to Visit  Medication Dose Route Frequency Provider Last Rate Last Dose  . 0.9 %  sodium chloride infusion  500 mL Intravenous Once Doran Stabler, MD        Allergies  Allergen Reactions  . Tramadol Itching  . Shrimp [Shellfish Allergy] Hives     Objective:  BP 130/74 (BP Location: Right Arm, Patient Position: Sitting, Cuff Size:  Large)   Pulse 63   Temp 98 F (36.7 C) (Oral)   Resp 16   Ht 6' 2"  (1.88 m)   Wt 236 lb 12.8 oz (107.4 kg)   SpO2 96%   BMI 30.40 kg/m   Physical Exam  Constitutional: He is oriented to person, place, and time. He appears well-developed and well-nourished. No distress.  HENT:  Head: Normocephalic and atraumatic.  Eyes: Conjunctivae are normal.  Neck: Normal range of motion.  Pulmonary/Chest: Effort normal.  Neurological: He is alert and oriented to person, place, and time.  Skin: Skin is warm and dry.  Psychiatric: He has a normal mood and affect.  Vitals reviewed.   Assessment and Plan :  1. Crohn's disease of colon without complication ([place]) Prevnar 13 and flu vaccine given.  Shingrix vaccine sent to pharmacy.  Per review of Dr. Corena Pilgrim recommendations, follow-up in 2 months for Pneumovax 23.  Will receive Twinrix vaccine at Dr. Corena Pilgrim office. - Zoster Vaccine Adjuvanted [facility]) injection; Inject 0.5 mLs into the muscle once for 1 dose.  Dispense: 0.5 mL; Refill: 0 - Pneumococcal conjugate vaccine 13-valent IM - Flu Vaccine QUAD 36+ mos IM  2. History of gout First incident of gout.  Encourage patient to complete prednisone taper.  Given education material on low purine diet.  Given Rx for indomethacin to use  for future flareups.  Follow-up as needed. - indomethacin (INDOCIN) 50 MG capsule; Start 50 mg 3 times daily; initiate within 24 to 48 hours of flare onset preferably; discontinue 2 to 3 days after resolution of clinical signs; usual duration: 5 to 7 days  Dispense: 30 capsule; Refill: 0   Tenna Delaine PA-C  [facility] 03/28/2018 5:24 PM

## 2018-03-28 NOTE — Patient Instructions (Addendum)
Today, you had flu and PCV 13 vaccine. Shingrix is at your pharmacy.  Have Twinrix at GI office.  Return here in 2 months for 2nd pneumonia shot.   Make an appointment for orthopedics to take about bone lesion.   Continue prednisone for gout. If it comes back right after stopping prednisone, come see me.   In the future, can use indomethacin for flare ups.  Dosing below: 50 mg 3 times daily; initiate within 24 to 48 hours of flare onset preferably; discontinue 2 to 3 days after resolution of clinical signs; usual duration: 5 to 7 days     If you have lab work done today you will be contacted with your lab results within the next 2 weeks.  If you have not heard from [place] then please contact us. The fastest way to get your results is to register for My Chart.    Low-Purine Diet Purines are compounds that affect the level of uric acid in your body. A low-purine diet is a diet that is low in purines. Eating a low-purine diet can prevent the level of uric acid in your body from getting too high and causing gout or kidney stones or both. What do I need to know about this diet?  Choose low-purine foods. Examples of low-purine foods are listed in the next section.  Drink plenty of fluids, especially water. Fluids can help remove uric acid from your body. Try to drink 8-16 cups (1.9-3.8 L) a day.  Limit foods high in fat, especially saturated fat, as fat makes it harder for the body to get rid of uric acid. Foods high in saturated fat include pizza, cheese, ice cream, whole milk, fried foods, and gravies. Choose foods that are lower in fat and lean sources of protein. Use olive oil when cooking as it contains healthy fats that are not high in saturated fat.  Limit alcohol. Alcohol interferes with the elimination of uric acid from your body. If you are having a gout attack, avoid all alcohol.  Keep in mind that different people's bodies react differently to different foods. You will probably  learn over time which foods do or do not affect you. If you discover that a food tends to cause your gout to flare up, avoid eating that food. You can more freely enjoy foods that do not cause problems. If you have any questions about a food item, talk to your dietitian or health care provider. Which foods are low, moderate, and high in purines? The following is a list of foods that are low, moderate, and high in purines. You can eat any amount of the foods that are low in purines. You may be able to have small amounts of foods that are moderate in purines. Ask your health care provider how much of a food moderate in purines you can have. Avoid foods high in purines. Grains  Foods low in purines: Enriched white bread, pasta, rice, cake, cornbread, popcorn.  Foods moderate in purines: Whole-grain breads and cereals, wheat germ, bran, oatmeal. Uncooked oatmeal. Dry wheat bran or wheat germ.  Foods high in purines: Pancakes, [place] toast, biscuits, muffins. Vegetables  Foods low in purines: All vegetables, except those that are moderate in purines.  Foods moderate in purines: Asparagus, cauliflower, spinach, mushrooms, green peas. Fruits  All fruits are low in purines. Meats and other Protein Foods  Foods low in purines: Eggs, nuts, peanut butter.  Foods moderate in purines: 80-90% lean beef, lamb, veal, pork, poultry, fish,  eggs, peanut butter, nuts. Crab, lobster, oysters, and shrimp. Cooked dried beans, peas, and lentils.  Foods high in purines: Anchovies, sardines, herring, mussels, tuna, codfish, scallops, trout, and haddock. Berniece Salines. Organ meats (such as liver or kidney). Tripe. Game meat. Goose. Sweetbreads. Dairy  All dairy foods are low in purines. Low-fat and fat-free dairy products are best because they are low in saturated fat. Beverages  Drinks low in purines: Water, carbonated beverages, tea, coffee, cocoa.  Drinks moderate in purines: Soft drinks and other drinks sweetened  with high-fructose corn syrup. Juices. To find whether a food or drink is sweetened with high-fructose corn syrup, look at the ingredients list.  Drinks high in purines: Alcoholic beverages (such as beer). Condiments  Foods low in purines: Salt, herbs, olives, pickles, relishes, vinegar.  Foods moderate in purines: Butter, margarine, oils, mayonnaise. Fats and Oils  Foods low in purines: All types, except gravies and sauces made with meat.  Foods high in purines: Gravies and sauces made with meat. Other Foods  Foods low in purines: Sugars, sweets, gelatin. Cake. Soups made without meat.  Foods moderate in purines: Meat-based or fish-based soups, broths, or bouillons. Foods and drinks sweetened with high-fructose corn syrup.  Foods high in purines: High-fat desserts (such as ice cream, cookies, cakes, pies, doughnuts, and chocolate). Contact your dietitian for more information on foods that are not listed here. This information is not intended to replace advice given to you by your health care provider. Make sure you discuss any questions you have with your health care provider. Document Released: 09/08/2010 Document Revised: 10/20/2015 Document Reviewed: 04/20/2013 Elsevier Interactive Patient Education  2017 [employer].   IF you received an x-ray today, you will receive an invoice from [facility]. Please contact [facility] at [phone] with questions or concerns regarding your invoice.   IF you received labwork today, you will receive an invoice from [place]. Please contact [facility] at [phone] with questions or concerns regarding your invoice.   Our billing staff will not be able to assist you with questions regarding bills from these companies.  You will be contacted with the lab results as soon as they are available. The fastest way to get your results is to activate your My Chart account. Instructions are located on the last page of this paperwork.  If you have not heard from [place] regarding the results in 2 weeks, please contact this office.    Influenza (Flu) Vaccine (Inactivated or Recombinant): What You Need to Know 1. Why get vaccinated? Influenza ("flu") is a contagious disease that spreads around the [place] every year, usually between October and May. Flu is caused by influenza viruses, and is spread mainly by coughing, sneezing, and close contact. Anyone can get flu. Flu strikes suddenly and can last several days. Symptoms vary by age, but can include:  fever/chills  sore throat  muscle aches  fatigue  cough  headache  runny or stuffy nose  Flu can also lead to pneumonia and blood infections, and cause diarrhea and seizures in children. If you have a medical condition, such as heart or lung disease, flu can make it worse. Flu is more dangerous for some people. Infants and young children, people 70 years of age and older, pregnant women, and people with certain health conditions or a weakened immune system are at greatest risk. Each year thousands of people in the [place] die from flu, and many more are hospitalized. Flu vaccine can:  keep you from getting flu,  make flu less severe if you do get it, and  keep you from spreading flu to your family and other people. 2. Inactivated and recombinant flu vaccines A dose of flu vaccine is recommended every flu season. Children 6 months through 49 years of age may need two doses during the same flu season. Everyone else needs only one dose each flu season. Some inactivated flu vaccines contain a very small amount of a mercury-based preservative called thimerosal. Studies have not shown thimerosal in vaccines to be harmful, but flu vaccines that do not contain thimerosal are available. There is no live flu virus in flu shots. They cannot cause the flu. There are many flu viruses, and they are always changing. Each year a new flu vaccine is made to protect against  three or four viruses that are likely to cause disease in the upcoming flu season. But even when the vaccine doesn't exactly match these viruses, it may still provide some protection. Flu vaccine cannot prevent:  flu that is caused by a virus not covered by the vaccine, or  illnesses that look like flu but are not.  It takes about 2 weeks for protection to develop after vaccination, and protection lasts through the flu season. 3. Some people should not get this vaccine Tell the person who is giving you the vaccine:  If you have any severe, life-threatening allergies. If you ever had a life-threatening allergic reaction after a dose of flu vaccine, or have a severe allergy to any part of this vaccine, you may be advised not to get vaccinated. Most, but not all, types of flu vaccine contain a small amount of egg protein.  If you ever had Guillain-Barr Syndrome (also called GBS). Some people with a history of GBS should not get this vaccine. This should be discussed with your doctor.  If you are not feeling well. It is usually okay to get flu vaccine when you have a mild illness, but you might be asked to come back when you feel better.  4. Risks of a vaccine reaction With any medicine, including vaccines, there is a chance of reactions. These are usually mild and go away on their own, but serious reactions are also possible. Most people who get a flu shot do not have any problems with it. Minor problems following a flu shot include:  soreness, redness, or swelling where the shot was given  hoarseness  sore, red or itchy eyes  cough  fever  aches  headache  itching  fatigue  If these problems occur, they usually begin soon after the shot and last 1 or 2 days. More serious problems following a flu shot can include the following:  There may be a small increased risk of Guillain-Barre Syndrome (GBS) after inactivated flu vaccine. This risk has been estimated at 1 or 2 additional  cases per million people vaccinated. This is much lower than the risk of severe complications from flu, which can be prevented by flu vaccine.  Young children who get the flu shot along with pneumococcal vaccine (PCV13) and/or DTaP vaccine at the same time might be slightly more likely to have a seizure caused by fever. Ask your doctor for more information. Tell your doctor if a child who is getting flu vaccine has ever had a seizure.  Problems that could happen after any injected vaccine:  People sometimes faint after a medical procedure, including vaccination. Sitting or lying down for about 15 minutes can help prevent fainting, and injuries caused  by a fall. Tell your doctor if you feel dizzy, or have vision changes or ringing in the ears.  Some people get severe pain in the shoulder and have difficulty moving the arm where a shot was given. This happens very rarely.  Any medication can cause a severe allergic reaction. Such reactions from a vaccine are very rare, estimated at about 1 in a million doses, and would happen within a few minutes to a few hours after the vaccination. As with any medicine, there is a very remote chance of a vaccine causing a serious injury or death. The safety of vaccines is always being monitored. For more information, visit: http://www.aguilar.org/ 5. What if there is a serious reaction? What should I look for? Look for anything that concerns you, such as signs of a severe allergic reaction, very high fever, or unusual behavior. Signs of a severe allergic reaction can include hives, swelling of the face and throat, difficulty breathing, a fast heartbeat, dizziness, and weakness. These would start a few minutes to a few hours after the vaccination. What should I do?  If you think it is a severe allergic reaction or other emergency that can't wait, call 9-1-1 and get the person to the nearest hospital. Otherwise, call your doctor.  Reactions should be reported  to the Vaccine Adverse Event Reporting System (VAERS). Your doctor should file this report, or you can do it yourself through the VAERS web site at www.vaers.SamedayNews.es, or by calling (262)209-8338. ? VAERS does not give medical advice. 6. The National Vaccine Injury Compensation Program The [employer] Vaccine Injury Compensation Program (VICP) is a federal program that was created to compensate people who may have been injured by certain vaccines. Persons who believe they may have been injured by a vaccine can learn about the program and about filing a claim by calling (872) 448-6618 or visiting the [place] website at GoldCloset.com.ee. There is a time limit to file a claim for compensation. 7. How can I learn more?  Ask your healthcare provider. He or she can give you the vaccine package insert or suggest other sources of information.  Call your local or state health department.  Contact the Centers for Disease Control and Prevention (CDC): ? Call (857)780-4611 (1-800-CDC-INFO) or ? Visit CDC's website at https://gibson.com/ Vaccine Information Statement, Inactivated Influenza Vaccine (01/01/2014) This information is not intended to replace advice given to you by your health care provider. Make sure you discuss any questions you have with your health care provider. Document Released: 03/08/2006 Document Revised: 02/02/2016 Document Reviewed: 02/02/2016 Elsevier Interactive Patient Education  2017 [place]. Pneumococcal Conjugate Vaccine suspension for injection What is this medicine? PNEUMOCOCCAL VACCINE (NEU mo KOK al vak SEEN) is a vaccine used to prevent pneumococcus bacterial infections. These bacteria can cause serious infections like pneumonia, meningitis, and blood infections. This vaccine will lower your chance of getting pneumonia. If you do get pneumonia, it can make your symptoms milder and your illness shorter. This vaccine will not treat an infection and will not cause  infection. This vaccine is recommended for infants and young children, adults with certain medical conditions, and adults 61 years or older. This medicine may be used for other purposes; ask your health care provider or pharmacist if you have questions. COMMON BRAND NAME(S): Prevnar, Prevnar 13 What should I tell my health care provider before I take this medicine? They need to know if you have any of these conditions: -bleeding problems -fever -immune system problems -an unusual or allergic reaction  to pneumococcal vaccine, diphtheria toxoid, other vaccines, latex, other medicines, foods, dyes, or preservatives -pregnant or trying to get pregnant -breast-feeding How should I use this medicine? This vaccine is for injection into a muscle. It is given by a health care professional. A copy of Vaccine Information Statements will be given before each vaccination. Read this sheet carefully each time. The sheet may change frequently. Talk to your pediatrician regarding the use of this medicine in children. While this drug may be prescribed for children as young as 2 weeks old for selected conditions, precautions do apply. Overdosage: If you think you have taken too much of this medicine contact a poison control center or emergency room at once. NOTE: This medicine is only for you. Do not share this medicine with others. What if I miss a dose? It is important not to miss your dose. Call your doctor or health care professional if you are unable to keep an appointment. What may interact with this medicine? -medicines for cancer chemotherapy -medicines that suppress your immune function -steroid medicines like prednisone or cortisone This list may not describe all possible interactions. Give your health care provider a list of all the medicines, herbs, non-prescription drugs, or dietary supplements you use. Also tell them if you smoke, drink alcohol, or use illegal drugs. Some items may interact with  your medicine. What should I watch for while using this medicine? Mild fever and pain should go away in 3 days or less. Report any unusual symptoms to your doctor or health care professional. What side effects may I notice from receiving this medicine? Side effects that you should report to your doctor or health care professional as soon as possible: -allergic reactions like skin rash, itching or hives, swelling of the face, lips, or tongue -breathing problems -confused -fast or irregular heartbeat -fever over 102 degrees F -seizures -unusual bleeding or bruising -unusual muscle weakness Side effects that usually do not require medical attention (report to your doctor or health care professional if they continue or are bothersome): -aches and pains -diarrhea -fever of 102 degrees F or less -headache -irritable -loss of appetite -pain, tender at site where injected -trouble sleeping This list may not describe all possible side effects. Call your doctor for medical advice about side effects. You may report side effects to FDA at 1-800-FDA-1088. Where should I keep my medicine? This does not apply. This vaccine is given in a clinic, pharmacy, doctor's office, or other health care setting and will not be stored at home. NOTE: This sheet is a summary. It may not cover all possible information. If you have questions about this medicine, talk to your doctor, pharmacist, or health care provider.  2018 Elsevier/Gold Standard (2014-02-18 10:27:27)

## 2018-04-02 ENCOUNTER — Telehealth: Payer: Self-pay | Admitting: Gastroenterology

## 2018-04-02 NOTE — Telephone Encounter (Signed)
Looking at letter mailed to him on 10/22, he is probably calling to schedule the TwinRx (hep A & B) injection series of 3 here at our office. Asked him to call back.

## 2018-04-02 NOTE — Telephone Encounter (Signed)
Pt needs to schedule some injections. Pls call him.

## 2018-04-04 ENCOUNTER — Other Ambulatory Visit: Payer: Self-pay

## 2018-04-04 ENCOUNTER — Telehealth: Payer: Self-pay | Admitting: Gastroenterology

## 2018-04-04 NOTE — Telephone Encounter (Signed)
Spoke to patient he still needs Shingrix vaccine and TwinRx. He will go to [facility] for [facility]rix and have scheduled him for 1st TwinRx injection on 11/12.

## 2018-04-17 DIAGNOSIS — Z719 Counseling, unspecified: Secondary | ICD-10-CM | POA: Diagnosis not present

## 2018-04-30 ENCOUNTER — Telehealth: Payer: Self-pay | Admitting: Physician Assistant

## 2018-04-30 NOTE — Telephone Encounter (Signed)
Talked with pt, done.

## 2018-04-30 NOTE — Telephone Encounter (Signed)
Copied from [place] [id number]. Topic: Medical Record Request - Other >> Apr 30, 2018 11:55 AM Virl Axe D wrote: Patient Name/DOB/MRN #: Randy Wilson/[id number]/02/18/[id number] Requestor Name/Agency: Dr. Jackqulyn Livings Orthopedic Surgeon / [facility] Call Back #: Fax [phone] / Contact pt with further questions Cb#[phone]  Information Requested: X-rays   Route to [place] for [place] clinics. For all other clinics, route to the clinic's PEC Pool.

## 2018-05-10 ENCOUNTER — Telehealth: Payer: Self-pay

## 2018-05-10 NOTE — Telephone Encounter (Signed)
Copied from [place] [phone]. Topic: Medical Record Request - Other >> Apr 30, 2018 11:55 AM Virl Axe D wrote: Patient Name/DOB/MRN #: Randy Wilson/[id number]/12/[id number] Requestor Name/Agency: Dr. Jackqulyn Livings Orthopedic Surgeon / [facility] Call Back #: Fax [phone] / Contact pt with further questions Cb#[phone]  Information Requested: X-rays   Route to [place] for [place] clinics. For all other clinics, route to the clinic's PEC Pool.   Per note in chart - [place] w/Medical records talked w/pt - states done.

## 2018-06-03 ENCOUNTER — Ambulatory Visit (INDEPENDENT_AMBULATORY_CARE_PROVIDER_SITE_OTHER): Payer: 59 | Admitting: Gastroenterology

## 2018-06-03 DIAGNOSIS — Z23 Encounter for immunization: Secondary | ICD-10-CM

## 2018-06-03 MED ORDER — PNEUMOCOCCAL VAC POLYVALENT 25 MCG/0.5ML IJ INJ
0.5000 mL | INJECTION | Freq: Once | INTRAMUSCULAR | Status: AC
  Administered 2018-06-03: 0.5 mL via INTRAMUSCULAR

## 2018-06-03 NOTE — Progress Notes (Signed)
Both standard Twinrix #1 and pneumovax 23 given to patient today with Dr Loletha Carrow authorization. Patient already had pneumovax 13 03/2018 so is due for pneumo 23. Patient has been advised that pneumovax 23 was next shot in progression and that he needs 2 more twinrix shots to complete hep a/b series which he verbalizes understanding of.

## 2018-06-05 ENCOUNTER — Other Ambulatory Visit: Payer: Self-pay | Admitting: Gastroenterology

## 2018-06-05 NOTE — Telephone Encounter (Signed)
Ryan from [employer] called to verify [employer].

## 2018-06-05 NOTE — Telephone Encounter (Signed)
I spoke with the pharmacist at [facility] and they needed  information on the pt new insurance.  They will reach out to the pt and get updated info and work on getting the pt approval.

## 2018-06-06 NOTE — Telephone Encounter (Signed)
Done

## 2018-06-09 ENCOUNTER — Other Ambulatory Visit: Payer: Self-pay

## 2018-06-09 MED ORDER — ADALIMUMAB 80 MG/0.8ML ~~LOC~~ AJKT: 160.0000 mg | AUTO-INJECTOR | Freq: Once | SUBCUTANEOUS | 0 refills | Status: DC

## 2018-06-09 MED ORDER — ADALIMUMAB 40 MG/0.4ML ~~LOC~~ AJKT: 40.0000 mg | AUTO-INJECTOR | SUBCUTANEOUS | 11 refills | Status: DC

## 2018-07-08 ENCOUNTER — Ambulatory Visit (INDEPENDENT_AMBULATORY_CARE_PROVIDER_SITE_OTHER): Payer: 59 | Admitting: Gastroenterology

## 2018-07-08 DIAGNOSIS — Z23 Encounter for immunization: Secondary | ICD-10-CM | POA: Diagnosis not present

## 2018-07-09 ENCOUNTER — Ambulatory Visit (INDEPENDENT_AMBULATORY_CARE_PROVIDER_SITE_OTHER): Payer: 59 | Admitting: Family Medicine

## 2018-07-09 ENCOUNTER — Other Ambulatory Visit: Payer: Self-pay

## 2018-07-09 ENCOUNTER — Encounter: Payer: Self-pay | Admitting: Family Medicine

## 2018-07-09 VITALS — BP 115/76 | HR 79 | Temp 98.0°F | Resp 17 | Ht 74.0 in | Wt 236.2 lb

## 2018-07-09 DIAGNOSIS — J069 Acute upper respiratory infection, unspecified: Secondary | ICD-10-CM | POA: Diagnosis not present

## 2018-07-09 MED ORDER — FLUTICASONE PROPIONATE 50 MCG/ACT NA SUSP: 2.0000 | Freq: Every day | NASAL | 6 refills | Status: DC

## 2018-07-09 MED ORDER — GUAIFENESIN ER 600 MG PO TB12: 1200.0000 mg | ORAL_TABLET | Freq: Two times a day (BID) | ORAL | 0 refills | Status: DC

## 2018-07-09 MED ORDER — CETIRIZINE HCL 10 MG PO TABS: 10.0000 mg | ORAL_TABLET | Freq: Every day | ORAL | 11 refills | Status: DC

## 2018-07-09 NOTE — Patient Instructions (Addendum)
[facility] Sinus Rinse    If you have lab work done today you will be contacted with your lab results within the next 2 weeks.  If you have not heard from [place] then please contact us. The fastest way to get your results is to register for My Chart.   IF you received an x-ray today, you will receive an invoice from [facility]. Please contact [facility] at [phone] with questions or concerns regarding your invoice.   IF you received labwork today, you will receive an invoice from [facility]. Please contact [facility] at [phone] with questions or concerns regarding your invoice.   Our billing staff will not be able to assist you with questions regarding bills from these companies.  You will be contacted with the lab results as soon as they are available. The fastest way to get your results is to activate your My Chart account. Instructions are located on the last page of this paperwork. If you have not heard from [place] regarding the results in 2 weeks, please contact this office.     Viral Respiratory Infection A respiratory infection is an illness that affects part of the respiratory system, such as the lungs, nose, or throat. A respiratory infection that is caused by a virus is called a viral respiratory infection. Common types of viral respiratory infections include:  A cold.  The flu (influenza).  A respiratory syncytial virus (RSV) infection. What are the causes? This condition is caused by a virus. What are the signs or symptoms? Symptoms of this condition include:  A stuffy or runny nose.  Yellow or green nasal discharge.  A cough.  Sneezing.  Fatigue.  Achy muscles.  A sore throat.  Sweating or chills.  A fever.  A headache. How is this diagnosed? This condition may be diagnosed based on:  Your symptoms.  A physical exam.  Testing of nasal swabs. How is this treated? This condition may be treated with medicines, such as:  Antiviral  medicine. This may shorten the length of time a person has symptoms.  Expectorants. These make it easier to cough up mucus.  Decongestant nasal sprays.  Acetaminophen or NSAIDs to relieve fever and pain. Antibiotic medicines are not prescribed for viral infections. This is because antibiotics are designed to kill bacteria. They are not effective against viruses. Follow these instructions at home:  Managing pain and congestion  Take over-the-counter and prescription medicines only as told by your health care provider.  If you have a sore throat, gargle with a salt-water mixture 3-4 times a day or as needed. To make a salt-water mixture, completely dissolve -1 tsp of salt in 1 cup of warm water.  Use nose drops made from salt water to ease congestion and soften raw skin around your nose.  Drink enough fluid to keep your urine pale yellow. This helps prevent dehydration and helps loosen up mucus. General instructions  Rest as much as possible.  Do not drink alcohol.  Do not use any products that contain nicotine or tobacco, such as cigarettes and e-cigarettes. If you need help quitting, ask your health care provider.  Keep all follow-up visits as told by your health care provider. This is important. How is this prevented?   Get an annual flu shot. You may get the flu shot in late summer, fall, or winter. Ask your health care provider when you should get your flu shot.  Avoid exposing others to your respiratory infection. ? Stay home from work or school  as told by your health care provider. ? Wash your hands with soap and water often, especially after you cough or sneeze. If soap and water are not available, use alcohol-based hand sanitizer.  Avoid contact with people who are sick during cold and flu season. This is generally fall and winter. Contact a health care provider if:  Your symptoms last for 10 days or longer.  Your symptoms get worse over time.  You have a  fever.  You have severe sinus pain in your face or forehead.  The glands in your jaw or neck become very swollen. Get help right away if you:  Feel pain or pressure in your chest.  Have shortness of breath.  Faint or feel like you will faint.  Have severe and persistent vomiting.  Feel confused or disoriented. Summary  A respiratory infection is an illness that affects part of the respiratory system, such as the lungs, nose, or throat. A respiratory infection that is caused by a virus is called a viral respiratory infection.  Common types of viral respiratory infections are a cold, influenza, and respiratory syncytial virus (RSV) infection.  Symptoms of this condition include a stuffy or runny nose, cough, sneezing, fatigue, achy muscles, sore throat, and fevers or chills.  Antibiotic medicines are not prescribed for viral infections. This is because antibiotics are designed to kill bacteria. They are not effective against viruses. This information is not intended to replace advice given to you by your health care provider. Make sure you discuss any questions you have with your health care provider. Document Released: 02/21/2005 Document Revised: 06/24/2017 Document Reviewed: 06/24/2017 Elsevier Interactive Patient Education  2019 [employer].  How to Perform a Sinus Rinse A sinus rinse is a home treatment that is used to rinse your sinuses with a sterile mixture of salt and water (saline solution). Sinuses are air-filled spaces in your skull behind the bones of your face and forehead that open into your nasal cavity. A sinus rinse can help to clear mucus, dirt, dust, or pollen from your nasal cavity. You may do a sinus rinse when you have a cold, a virus, nasal allergy symptoms, a sinus infection, or stuffiness in your nose or sinuses. Talk with your health care provider about whether a sinus rinse might help you. What are the risks? A sinus rinse is generally safe and effective.  However, there are a few risks, which include:  A burning sensation in your sinuses. This may happen if you do not make the saline solution as directed. Be sure to follow all directions when making the saline solution.  Nasal irritation.  Infection from contaminated water. This is rare, but possible. Do not do a sinus rinse if you have had ear or nasal surgery, ear infection, or blocked ears. Supplies needed:  Saline solution or powder.  Distilled or sterile water may be needed to mix with saline powder. ? You may use boiled and cooled tap water. Boil tap water for 5 minutes; cool until it is lukewarm. Use within 24 hours. ? Do not use regular tap water to mix with the saline solution.  Neti pot or nasal rinse bottle. These supplies release the saline solution into your nose and through your sinuses. Neti pots and nasal rinse bottles can be purchased at [employer], a health food store, or online. How to perform a sinus rinse  1. Wash your hands with soap and water. 2. Wash your device according to the directions that came with  the product and then dry it. 3. Use the solution that comes with your product or one that is sold separately in stores. Follow the mixing directions on the package if you need to mix with sterile or distilled water. 4. Fill the device with the amount of saline solution noted in the device instructions. 5. Stand over a sink and tilt your head sideways over the sink. 6. Place the spout of the device in your upper nostril (the one closer to the ceiling). 7. Gently pour or squeeze the saline solution into your nasal cavity. The liquid should drain out from the lower nostril if you are not too congested. 8. While rinsing, breathe through your open mouth. 9. Gently blow your nose to clear any mucus and rinse solution. Blowing too hard may cause ear pain. 10. Repeat in your other nostril. 11. Clean and rinse your device with clean water and then air-dry it. Talk  with your health care provider or pharmacist if you have questions about how to do a sinus rinse. Summary  A sinus rinse is a home treatment that is used to rinse your sinuses with a sterile mixture of salt and water (saline solution).  A sinus rinse is generally safe and effective. Follow all instructions carefully.  Before doing a sinus rinse, talk with your health care provider about whether it would be helpful for you. This information is not intended to replace advice given to you by your health care provider. Make sure you discuss any questions you have with your health care provider. Document Released: 12/09/2013 Document Revised: 03/11/2017 Document Reviewed: 03/11/2017 Elsevier Interactive Patient Education  2019 [employer].

## 2018-07-09 NOTE — Progress Notes (Signed)
Established Patient Office Visit  Subjective:  Patient ID: Randy Wilson, male    DOB: 1966-01-23  Age: 53 y.o. MRN: [id number]  CC:  Chief Complaint  Patient presents with  . URI    x  yesterday, cough, head congestion, rn and cold sweats. Per pt he has gotten a flu shot for this season. Pt denies fever or any other symptoms.  Per pt he has not taken any otc meds for sxs    HPI Randy Wilson presents for   Patient reports that he has a cough with nasal congestion and green discharge No fevers Some chills No nausea or vomiting No contact with anyone with flu Coworkers have cold symptoms   Past Medical History:  Diagnosis Date  . Allergy   . Asthma AS CHILD   NONE NOW  . Chronic enteritis   . Hemorrhoids   . Inflammatory polyps of colon ([place])   . Rectal bleeding NONE IN LAST 3 MONTHS  . Shoulder pain    RIGHT    Past Surgical History:  Procedure Laterality Date  . ANKLE SURGERY Left 2002   bone spurs removed  . COLONOSCOPY    . COLONSCOPY     X 4  . LAPAROSCOPIC ILEOCECECTOMY N/A 11/02/2015   Procedure: LAPAROSCOPIC ILEOCECECTOMY;  Surgeon: Leighton Ruff, MD;  Location: [facility];  Service: General;  Laterality: N/A;  . POLYPECTOMY    . SHOULDER SURGERY Left 2010   bone spurs removed     Family History  Problem Relation Age of Onset  . Asthma Mother 11       asthma attack   . Stroke Brother        Myocardial infarction  . Stroke Brother   . Asthma Sister   . Colon cancer Neg Hx   . Stomach cancer Neg Hx   . Rectal cancer Neg Hx   . Esophageal cancer Neg Hx   . Colon polyps Neg Hx   . Allergic rhinitis Neg Hx   . Angioedema Neg Hx   . Eczema Neg Hx   . Urticaria Neg Hx   . Liver cancer Neg Hx   . Pancreatic cancer Neg Hx     Social History   Socioeconomic History  . Marital status: Married    Spouse name: Kristeen Miss   . Number of children: 3  . Years of education: Not on file  . Highest education level: High school graduate  Occupational History  .  Occupation: Recieving Scientist, clinical (histocompatibility and immunogenetics)    Comment: [employer]  Social Needs  . Financial resource strain: Not hard at all  . Food insecurity:    Worry: Never true    Inability: Never true  . Transportation needs:    Medical: No    Non-medical: No  Tobacco Use  . Smoking status: Former Smoker    Packs/day: 1.00    Years: 25.00    Pack years: 25.00    Types: Cigarettes    Last attempt to quit: 01/21/2006    Years since quitting: 12.4  . Smokeless tobacco: Never Used  Substance and Sexual Activity  . Alcohol use: Yes    Alcohol/week: 6.0 standard drinks    Types: 6 Cans of beer per week    Comment: OCC  . Drug use: No    Types: Marijuana  . Sexual activity: Yes    Comment: with monogamous wife, she takes OCP  Lifestyle  . Physical activity:    Days per week: 4 days  Minutes per session: 60 min  . Stress: Not at all  Relationships  . Social connections:    Talks on phone: More than three times a week    Gets together: More than three times a week    Attends religious service: 1 to 4 times per year    Active member of club or organization: No    Attends meetings of clubs or organizations: Never    Relationship status: Married  . Intimate partner violence:    Fear of current or ex partner: No    Emotionally abused: No    Physically abused: No    Forced sexual activity: No  Other Topics Concern  . Not on file  Social History Narrative   Pt is from [place].     Outpatient Medications Prior to Visit  Medication Sig Dispense Refill  . cetirizine (ZYRTEC) 10 MG tablet Take 10 mg by mouth daily.    Marland Kitchen acetaminophen (TYLENOL) 650 MG CR tablet Take 650 mg by mouth every 8 (eight) hours as needed for pain.    . Adalimumab (HUMIRA PEN) 40 MG/0.4ML PNKT Inject 40 mg into the skin every 14 (fourteen) days. (Patient not taking: Reported on 07/09/2018) 2 each 11  . Adalimumab (HUMIRA PEN-CD/UC/HS STARTER) 80 MG/0.8ML PNKT Inject 160 mg into the skin once for 1 dose. Then 80 mg on day 15 3  each 0  . diclofenac sodium (VOLTAREN) 1 % GEL Apply 2 g topically 4 (four) times daily. (Patient not taking: Reported on 03/28/2018) 100 g 0  . indomethacin (INDOCIN) 50 MG capsule Start 50 mg 3 times daily; initiate within 24 to 48 hours of flare onset preferably; discontinue 2 to 3 days after resolution of clinical signs; usual duration: 5 to 7 days (Patient not taking: Reported on 07/09/2018) 30 capsule 0   Facility-Administered Medications Prior to Visit  Medication Dose Route Frequency Provider Last Rate Last Dose  . 0.9 %  sodium chloride infusion  500 mL Intravenous Once Doran Stabler, MD        Allergies  Allergen Reactions  . Tramadol Itching  . Shrimp [Shellfish Allergy] Hives    ROS Review of Systems See hpi   Objective:    Physical Exam  BP 115/76 (BP Location: Right Arm, Patient Position: Sitting, Cuff Size: Large)   Pulse 79   Temp 98 F (36.7 C) (Oral)   Resp 17   Ht 6' 2"  (1.88 m)   Wt 236 lb 3.2 oz (107.1 kg)   SpO2 94%   BMI 30.33 kg/m  Wt Readings from Last 3 Encounters:  07/09/18 236 lb 3.2 oz (107.1 kg)  03/28/18 236 lb 12.8 oz (107.4 kg)  03/07/18 230 lb 9.6 oz (104.6 kg)   General: alert, oriented, in NAD Head: normocephalic, atraumatic, no sinus tenderness Eyes: EOM intact, no scleral icterus or conjunctival injection Ears: TM clear bilaterally Nose: mucosa +erythematous, +edematous Throat: no pharyngeal exudate or erythema Lymph: no posterior auricular, submental or cervical lymph adenopathy Heart: normal rate, normal sinus rhythm, no murmurs Lungs: clear to auscultation bilaterally, no wheezing    There are no preventive care reminders to display for this patient.  There are no preventive care reminders to display for this patient.  Lab Results  Component Value Date   TSH 1.830 10/24/2017   Lab Results  Component Value Date   WBC 5.9 10/24/2017   HGB 14.4 10/24/2017   HCT 42.6 10/24/2017   MCV 86 10/24/2017   PLT 266  10/24/2017   Lab Results  Component Value Date   NA 140 10/24/2017   K 4.3 10/24/2017   CO2 19 (L) 10/24/2017   GLUCOSE 88 10/24/2017   BUN 13 10/24/2017   CREATININE 1.13 10/24/2017   BILITOT 0.6 10/24/2017   ALKPHOS 72 10/24/2017   AST 20 10/24/2017   ALT 21 10/24/2017   PROT 7.5 10/24/2017   ALBUMIN 4.6 10/24/2017   CALCIUM 9.4 10/24/2017   ANIONGAP 7 11/05/2015   Lab Results  Component Value Date   CHOL 179 10/24/2017   Lab Results  Component Value Date   HDL 50 10/24/2017   Lab Results  Component Value Date   LDLCALC 115 (H) 10/24/2017   Lab Results  Component Value Date   TRIG 72 10/24/2017   Lab Results  Component Value Date   CHOLHDL 3.6 10/24/2017   Lab Results  Component Value Date   HGBA1C 5.5 05/02/2016      Assessment & Plan:   Problem List Items Addressed This Visit    None    Visit Diagnoses    Acute URI    -  Primary   Relevant Medications   guaiFENesin (MUCINEX) 600 MG 12 hr tablet   fluticasone (FLONASE) 50 MCG/ACT nasal spray   cetirizine (ZYRTEC) 10 MG tablet    Discussed viral etiology Discussed otc decongestant Advised flonase for congestion  Increase hydration Vitamin C and zinc lozenges suggested Return to clinic if symptoms worse   Meds ordered this encounter  Medications  . guaiFENesin (MUCINEX) 600 MG 12 hr tablet    Sig: Take 2 tablets (1,200 mg total) by mouth 2 (two) times daily.    Dispense:  60 tablet    Refill:  0  . fluticasone (FLONASE) 50 MCG/ACT nasal spray    Sig: Place 2 sprays into both nostrils daily.    Dispense:  16 g    Refill:  6  . cetirizine (ZYRTEC) 10 MG tablet    Sig: Take 1 tablet (10 mg total) by mouth daily.    Dispense:  30 tablet    Refill:  11    Follow-up: Return if symptoms worsen or fail to improve.    Forrest Moron, MD

## 2018-07-24 ENCOUNTER — Ambulatory Visit: Payer: Self-pay | Admitting: *Deleted

## 2018-07-24 NOTE — Telephone Encounter (Signed)
Pt reports abdominal pain x 2 days. States left sided "Under rib cage." Denies nausea, vomiting, fever, no diaphoresis.  Pain is constant 3/10, does not radiate. States tender "A little" with palpation. LBM this AM. Has not taken anything except a laxative "Because I felt bloated." Appt made for tomorrow with Dr. Mitchel Honour. Care advise given per protocol. Directed to ED if pain worsens, nausea, vomiting occurs.    Reason for Disposition . [1] MILD pain (e.g., does not interfere with normal activities) AND [2] pain comes and goes (cramps) [3] present > 48 hours  Answer Assessment - Initial Assessment Questions 1. LOCATION: "Where does it hurt?"       Lt side, lower rib area 2. RADIATION: "Does the pain go anywhere else?" (e.g., into neck, jaw, arms, back)     no 3. ONSET: "When did the chest pain begin?" (Minutes, hours or days)      3 days ago 4. PATTERN "Does the pain come and go, or has it been constant since it started?"  "Does it get worse with exertion?"     constant 5. DURATION: "How long does it last" (e.g., seconds, minutes, hours)     constant 6. SEVERITY: "How bad is the pain?"  (e.g., Scale 1-10; mild, moderate, or severe)    - MILD (1-3): doesn't interfere with normal activities     - MODERATE (4-7): interferes with normal activities or awakens from sleep    - SEVERE (8-10): excruciating pain, unable to do any normal activities       3/10 7. CARDIAC RISK FACTORS: "Do you have any history of heart problems or risk factors for heart disease?" (e.g., prior heart attack, angina; high blood pressure, diabetes, being overweight, high cholesterol, smoking, or strong family history of heart disease)     no 8. PULMONARY RISK FACTORS: "Do you have any history of lung disease?"  (e.g., blood clots in lung, asthma, emphysema, birth control pills)     no 9. CAUSE: "What do you think is causing the chest pain?"     unsure 10. OTHER SYMPTOMS: "Do you have any other symptoms?" (e.g.,  dizziness, nausea, vomiting, sweating, fever, difficulty breathing, cough)      no  Answer Assessment - Initial Assessment Questions 1. LOCATION: "Where does it hurt?"      Lt abdomen "Under rib cage."  2. RADIATION: "Does the pain shoot anywhere else?" (e.g., chest, back)     no 3. ONSET: "When did the pain begin?" (Minutes, hours or days ago)      2 days ago 4. SUDDEN: "Gradual or sudden onset?"     gradual 5. PATTERN "Does the pain come and go, or is it constant?"    - If constant: "Is it getting better, staying the same, or worsening?"      (Note: Constant means the pain never goes away completely; most serious pain is constant and it progresses)     - If intermittent: "How long does it last?" "Do you have pain now?"     (Note: Intermittent means the pain goes away completely between bouts)     constant 6. SEVERITY: "How bad is the pain?"  (e.g., Scale 1-10; mild, moderate, or severe)    - MILD (1-3): doesn't interfere with normal activities, abdomen soft and not tender to touch     - MODERATE (4-7): interferes with normal activities or awakens from sleep, tender to touch     - SEVERE (8-10): excruciating pain, doubled over, unable to do  any normal activities       3/10 7. RECURRENT SYMPTOM: "Have you ever had this type of abdominal pain before?" If so, ask: "When was the last time?" and "What happened that timeno     no 8. CAUSE: "What do you think is causing the abdominal pain?"     unsure 9. RELIEVING/AGGRAVATING FACTORS: "What makes it better or worse?" (e.g., movement, antacids, bowel movement)     nothing 10. OTHER SYMPTOMS: "Has there been any vomiting, diarrhea, constipation, or urine problems?"      "bloated"  Protocols used: ABDOMINAL PAIN - MALE-A-AH, CHEST PAIN-A-AH

## 2018-07-25 ENCOUNTER — Ambulatory Visit: Payer: Self-pay | Admitting: Emergency Medicine

## 2018-08-18 ENCOUNTER — Other Ambulatory Visit: Payer: Self-pay

## 2018-08-18 ENCOUNTER — Encounter: Payer: Self-pay | Admitting: Family Medicine

## 2018-08-18 ENCOUNTER — Ambulatory Visit (INDEPENDENT_AMBULATORY_CARE_PROVIDER_SITE_OTHER): Payer: 59 | Admitting: Family Medicine

## 2018-08-18 VITALS — BP 118/84 | HR 64 | Temp 97.7°F | Resp 16 | Ht 73.0 in | Wt 239.2 lb

## 2018-08-18 DIAGNOSIS — Z8739 Personal history of other diseases of the musculoskeletal system and connective tissue: Secondary | ICD-10-CM | POA: Diagnosis not present

## 2018-08-18 DIAGNOSIS — M109 Gout, unspecified: Secondary | ICD-10-CM

## 2018-08-18 MED ORDER — PREDNISONE 20 MG PO TABS: 40.0000 mg | ORAL_TABLET | Freq: Every day | ORAL | 0 refills | Status: DC

## 2018-08-18 MED ORDER — INDOMETHACIN 50 MG PO CAPS: ORAL_CAPSULE | ORAL | 0 refills | Status: DC

## 2018-08-18 NOTE — Progress Notes (Signed)
Subjective:    Patient ID: KALADIN NOSEWORTHY, male    DOB: [birth year], 53 y.o.   MRN: [id number]  HPI BROADY LAFOY is a 53 y.o. male Presents today for: Chief Complaint  Patient presents with   Foot Pain    right big toe hurts. Was seen urgent care for 6-7 mo ago for the left big toe. Dx with gout   R great toe pain:  - Prior R foot pain 6-7 months ago - treated for possible gout. 03/[facility]/18 [facility]. R great toe pain 2 days at that time. Treated with prednisone - relieved sx's.  Uric acid level 7.2.   - current flair started last night. Pain worse this am. Same location, NKI. No new footwear. Steel toe at work. Pain with any light touch. Few beers over the weekend - few more than usual. Steak over the weekend.   - tx: 2 alleve. Min relief.  No fever.   Patient Active Problem List   Diagnosis Date Noted   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   Asthma 04/24/2015   Hx of colonic polyp 11/08/2014   History of colonic polyps 09/17/2014   Past Medical History:  Diagnosis Date   Allergy    Asthma AS CHILD   NONE NOW   Chronic enteritis    Hemorrhoids    Inflammatory polyps of colon ([place])    Rectal bleeding NONE IN LAST 3 MONTHS   Shoulder pain    RIGHT   Past Surgical History:  Procedure Laterality Date   ANKLE SURGERY Left 2002   bone spurs removed   COLONOSCOPY     COLONSCOPY     X 4   LAPAROSCOPIC ILEOCECECTOMY N/A 11/02/2015   Procedure: LAPAROSCOPIC ILEOCECECTOMY;  Surgeon: Leighton Ruff, MD;  Location: [facility];  Service: General;  Laterality: N/A;   POLYPECTOMY     SHOULDER SURGERY Left 2010   bone spurs removed    Allergies  Allergen Reactions   Tramadol Itching   Shrimp [Shellfish Allergy] Hives   Prior to Admission medications   Medication Sig Start Date End Date Taking? Authorizing Provider  cetirizine (ZYRTEC) 10 MG tablet Take 1 tablet (10 mg total) by mouth daily. 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 strain: Not hard at all   Food insecurity:    Worry: Never true    Inability: Never true   Transportation needs:    Medical: No    Non-medical: No  Tobacco Use   Smoking status: Former Smoker    Packs/day: 1.00    Years: 25.00    Pack years: 25.00    Types: Cigarettes    Last attempt to quit: 01/21/2006    Years since quitting: 12.5   Smokeless tobacco: Never Used  Substance and Sexual Activity   Alcohol use: Yes    Alcohol/week: 6.0 standard drinks    Types: 6 Cans of beer per week    Comment: [facility]   Drug use: No    Types: Marijuana   Sexual activity: Yes    Comment: with monogamous wife, she takes OCP  Lifestyle   Physical activity:  Days per week: 4 days    Minutes per session: 60 min   Stress: Not at all  Relationships   Social connections:    Talks on phone: More than three times a week    Gets together: More than three times a week    Attends religious service: 1 to 4 times per year    Active member of club or organization: No    Attends meetings of clubs or organizations: Never    Relationship status: Married   Intimate partner violence:    Fear of current or ex partner: No    Emotionally abused: No    Physically abused: No    Forced sexual activity: No  Other Topics Concern   Not on file  Social History Narrative   Pt is from [place].     Review of Systems     Objective:   Physical Exam Constitutional:      General: He is not in acute distress.    Appearance: He is well-developed.  HENT:     Head: Normocephalic and atraumatic.  Cardiovascular:     Rate and Rhythm: Normal rate.  Pulmonary:     Effort: Pulmonary effort is normal.   Musculoskeletal:     Right foot: Decreased range of motion. Tenderness (R 1st MTP, mild erythema, ttp. ) and swelling present.  Neurological:     Mental Status: He is alert and oriented to person, place, and time.    Vitals:   08/18/18 1358  BP: 118/84  Pulse: 64  Resp: 16  Temp: 97.7 F (36.5 C)  TempSrc: Oral  SpO2: 97%  Weight: 239 lb 3.2 oz (108.5 kg)  Height: 6' 1"  (1.854 m)       Assessment & Plan:    LAZER WOLLARD is a 53 y.o. male Podagra - Plan: predniSONE (DELTASONE) 20 MG tablet  History of gout - Plan: indomethacin (INDOCIN) 50 MG capsule, predniSONE (DELTASONE) 20 MG tablet Symptoms and exam consistent with podagra, gout flare.  Last episode in October.  Similar symptoms in the same area.  Recent flare may be due to dietary indiscretion.  Potential contributors discussed and avoidance measures.  -Recurrent flare, indomethacin 3 times daily as needed with food.  If not improving in the next 4 to 5 days, switch to prednisone, potential side effects and risk discussed.  -If recurrence of flare within 6 months, recommend uric acid testing as may need daily medication.  RTC precautions.  Work note given  Meds ordered this encounter  Medications   indomethacin (INDOCIN) 50 MG capsule    Sig: Start 50 mg 3 times daily; initiate within 24 to 48 hours of flare onset preferably; discontinue 2 to 3 days after resolution of clinical signs; usual duration: 5 to 7 days    Dispense:  30 capsule    Refill:  0   predniSONE (DELTASONE) 20 MG tablet    Sig: Take 2 tablets (40 mg total) by mouth daily with breakfast.    Dispense:  10 tablet    Refill:  0   Patient Instructions   Right great toe pain appears to be due to gout.  Recent diet changes may have contributed.  See information below on gout.  For current pain, start indomethacin, make sure to take that with food.  If that has not improved your symptoms in the next 4 to 5 days, stop indomethacin, and instead take  prednisone.  Do not take any NSAIDs while you are taking prednisone.  If any return of gout symptoms in the next 6 months I would recommend blood work to determine if you need to be on a daily medication.  Thank you for coming in today  Return to the clinic or go to the nearest emergency room if any of your symptoms worsen or new symptoms occur.   Gout  Gout is a condition that causes painful swelling of the joints. Gout is a type of inflammation of the joints (arthritis). This condition is caused by having too much uric acid in the body. Uric acid is a chemical that forms when the body breaks down substances called purines. Purines are important for building body proteins. When the body has too much uric acid, sharp crystals can form and build up inside the joints. This causes pain and swelling. Gout attacks can happen quickly and may be very painful (acute gout). Over time, the attacks can affect more joints and become more frequent (chronic gout). Gout can also cause uric acid to build up under the skin and inside the kidneys. What are the causes? This condition is caused by too much uric acid in your blood. This can happen because:  Your kidneys do not remove enough uric acid from your blood. This is the most common cause.  Your body makes too much uric acid. This can happen with some cancers and cancer treatments. It can also occur if your body is breaking down too many red blood cells (hemolytic anemia).  You eat too many foods that are high in purines. These foods include organ meats and some seafood. Alcohol, especially beer, is also high in purines. A gout attack may be triggered by trauma or stress. What increases the risk? You are more likely to develop this condition if you:  Have a family history of gout.  Are male and middle-aged.  Are male and have gone through menopause.  Are obese.  Frequently drink alcohol, especially beer.  Are dehydrated.  Lose weight too  quickly.  Have an organ transplant.  Have lead poisoning.  Take certain medicines, including aspirin, cyclosporine, diuretics, levodopa, and niacin.  Have kidney disease.  Have a skin condition called psoriasis. What are the signs or symptoms? An attack of acute gout happens quickly. It usually occurs in just one joint. The most common place is the big toe. Attacks often start at night. Other joints that may be affected include joints of the feet, ankle, knee, fingers, wrist, or elbow. Symptoms of this condition may include:  Severe pain.  Warmth.  Swelling.  Stiffness.  Tenderness. The affected joint may be very painful to touch.  Shiny, red, or purple skin.  Chills and fever. Chronic gout may cause symptoms more frequently. More joints may be involved. You may also have white or yellow lumps (tophi) on your hands or feet or in other areas near your joints. How is this diagnosed? This condition is diagnosed based on your symptoms, medical history, and physical exam. You may have tests, such as:  Blood tests to measure uric acid levels.  Removal of joint fluid with a thin needle (aspiration) to look for uric acid crystals.  X-rays to look for joint damage. How is this treated? Treatment for this condition has two phases: treating an acute attack and preventing future attacks. Acute gout treatment may include medicines to reduce pain and swelling, including:  NSAIDs.  Steroids. These are strong anti-inflammatory medicines that can be taken by mouth (orally) or injected into a joint.  Colchicine.  This medicine relieves pain and swelling when it is taken soon after an attack. It can be given by mouth or through an IV. Preventive treatment may include:  Daily use of smaller doses of NSAIDs or colchicine.  Use of a medicine that reduces uric acid levels in your blood.  Changes to your diet. You may need to see a dietitian about what to eat and drink to prevent  gout. Follow these instructions at home: During a gout attack   If directed, put ice on the affected area: ? Put ice in a plastic bag. ? Place a towel between your skin and the bag. ? Leave the ice on for 20 minutes, 2-3 times a day.  Raise (elevate) the affected joint above the level of your heart as often as possible.  Rest the joint as much as possible. If the affected joint is in your leg, you may be given crutches to use.  Follow instructions from your health care provider about eating or drinking restrictions. Avoiding future gout attacks  Follow a low-purine diet as told by your dietitian or health care provider. Avoid foods and drinks that are high in purines, including liver, kidney, anchovies, asparagus, herring, mushrooms, mussels, and beer.  Maintain a healthy weight or lose weight if you are overweight. If you want to lose weight, talk with your health care provider. It is important that you do not lose weight too quickly.  Start or maintain an exercise program as told by your health care provider. Eating and drinking  Drink enough fluids to keep your urine pale yellow.  If you drink alcohol: ? Limit how much you use to:  0-1 drink a day for women.  0-2 drinks a day for men. ? Be aware of how much alcohol is in your drink. In the [place] one drink equals one 12 oz bottle of beer (355 mL) one 5 oz glass of wine (148 mL), or one 1 oz glass of hard liquor (44 mL). General instructions  Take over-the-counter and prescription medicines only as told by your health care provider.  Do not drive or use heavy machinery while taking prescription pain medicine.  Return to your normal activities as told by your health care provider. Ask your health care provider what activities are safe for you.  Keep all follow-up visits as told by your health care provider. This is important. Contact a health care provider if you have:  Another gout attack.  Continuing symptoms of a  gout attack after 10 days of treatment.  Side effects from your medicines.  Chills or a fever.  Burning pain when you urinate.  Pain in your lower back or belly. Get help right away if you:  Have severe or uncontrolled pain.  Cannot urinate. Summary  Gout is painful swelling of the joints caused by inflammation.  The most common site of pain is the big toe, but it can affect other joints in the body.  Medicines and dietary changes can help to prevent and treat gout attacks. This information is not intended to replace advice given to you by your health care provider. Make sure you discuss any questions you have with your health care provider. Document Released: 05/11/2000 Document Revised: 12/04/2017 Document Reviewed: 12/04/2017 Elsevier Interactive Patient Education  [employer].     If you have lab work done today you will be contacted with your lab results within the next 2 weeks.  If you have not heard from [place] then please contact  us. The fastest way to get your results is to register for My Chart.   IF you received an x-ray today, you will receive an invoice from [facility]. Please contact [facility] at [phone] with questions or concerns regarding your invoice.   IF you received labwork today, you will receive an invoice from [place]. Please contact [facility] at [phone] with questions or concerns regarding your invoice.   Our billing staff will not be able to assist you with questions regarding bills from these companies.  You will be contacted with the lab results as soon as they are available. The fastest way to get your results is to activate your My Chart account. Instructions are located on the last page of this paperwork. If you have not heard from [place] regarding the results in 2 weeks, please contact this office.        Signed,   Merri Ray, MD [facility].  08/18/18 3:02 PM

## 2018-08-18 NOTE — Patient Instructions (Addendum)
Right great toe pain appears to be due to gout.  Recent diet changes may have contributed.  See information below on gout.  For current pain, start indomethacin, make sure to take that with food.  If that has not improved your symptoms in the next 4 to 5 days, stop indomethacin, and instead take prednisone.  Do not take any NSAIDs while you are taking prednisone.  If any return of gout symptoms in the next 6 months I would recommend blood work to determine if you need to be on a daily medication.  Thank you for coming in today  Return to the clinic or go to the nearest emergency room if any of your symptoms worsen or new symptoms occur.   Gout  Gout is a condition that causes painful swelling of the joints. Gout is a type of inflammation of the joints (arthritis). This condition is caused by having too much uric acid in the body. Uric acid is a chemical that forms when the body breaks down substances called purines. Purines are important for building body proteins. When the body has too much uric acid, sharp crystals can form and build up inside the joints. This causes pain and swelling. Gout attacks can happen quickly and may be very painful (acute gout). Over time, the attacks can affect more joints and become more frequent (chronic gout). Gout can also cause uric acid to build up under the skin and inside the kidneys. What are the causes? This condition is caused by too much uric acid in your blood. This can happen because:  Your kidneys do not remove enough uric acid from your blood. This is the most common cause.  Your body makes too much uric acid. This can happen with some cancers and cancer treatments. It can also occur if your body is breaking down too many red blood cells (hemolytic anemia).  You eat too many foods that are high in purines. These foods include organ meats and some seafood. Alcohol, especially beer, is also high in purines. A gout attack may be triggered by trauma or  stress. What increases the risk? You are more likely to develop this condition if you:  Have a family history of gout.  Are male and middle-aged.  Are male and have gone through menopause.  Are obese.  Frequently drink alcohol, especially beer.  Are dehydrated.  Lose weight too quickly.  Have an organ transplant.  Have lead poisoning.  Take certain medicines, including aspirin, cyclosporine, diuretics, levodopa, and niacin.  Have kidney disease.  Have a skin condition called psoriasis. What are the signs or symptoms? An attack of acute gout happens quickly. It usually occurs in just one joint. The most common place is the big toe. Attacks often start at night. Other joints that may be affected include joints of the feet, ankle, knee, fingers, wrist, or elbow. Symptoms of this condition may include:  Severe pain.  Warmth.  Swelling.  Stiffness.  Tenderness. The affected joint may be very painful to touch.  Shiny, red, or purple skin.  Chills and fever. Chronic gout may cause symptoms more frequently. More joints may be involved. You may also have white or yellow lumps (tophi) on your hands or feet or in other areas near your joints. How is this diagnosed? This condition is diagnosed based on your symptoms, medical history, and physical exam. You may have tests, such as:  Blood tests to measure uric acid levels.  Removal of joint fluid with a thin  needle (aspiration) to look for uric acid crystals.  X-rays to look for joint damage. How is this treated? Treatment for this condition has two phases: treating an acute attack and preventing future attacks. Acute gout treatment may include medicines to reduce pain and swelling, including:  NSAIDs.  Steroids. These are strong anti-inflammatory medicines that can be taken by mouth (orally) or injected into a joint.  Colchicine. This medicine relieves pain and swelling when it is taken soon after an attack. It can be  given by mouth or through an IV. Preventive treatment may include:  Daily use of smaller doses of NSAIDs or colchicine.  Use of a medicine that reduces uric acid levels in your blood.  Changes to your diet. You may need to see a dietitian about what to eat and drink to prevent gout. Follow these instructions at home: During a gout attack   If directed, put ice on the affected area: ? Put ice in a plastic bag. ? Place a towel between your skin and the bag. ? Leave the ice on for 20 minutes, 2-3 times a day.  Raise (elevate) the affected joint above the level of your heart as often as possible.  Rest the joint as much as possible. If the affected joint is in your leg, you may be given crutches to use.  Follow instructions from your health care provider about eating or drinking restrictions. Avoiding future gout attacks  Follow a low-purine diet as told by your dietitian or health care provider. Avoid foods and drinks that are high in purines, including liver, kidney, anchovies, asparagus, herring, mushrooms, mussels, and beer.  Maintain a healthy weight or lose weight if you are overweight. If you want to lose weight, talk with your health care provider. It is important that you do not lose weight too quickly.  Start or maintain an exercise program as told by your health care provider. Eating and drinking  Drink enough fluids to keep your urine pale yellow.  If you drink alcohol: ? Limit how much you use to:  0-1 drink a day for women.  0-2 drinks a day for men. ? Be aware of how much alcohol is in your drink. In the [place] one drink equals one 12 oz bottle of beer (355 mL) one 5 oz glass of wine (148 mL), or one 1 oz glass of hard liquor (44 mL). General instructions  Take over-the-counter and prescription medicines only as told by your health care provider.  Do not drive or use heavy machinery while taking prescription pain medicine.  Return to your normal activities as  told by your health care provider. Ask your health care provider what activities are safe for you.  Keep all follow-up visits as told by your health care provider. This is important. Contact a health care provider if you have:  Another gout attack.  Continuing symptoms of a gout attack after 10 days of treatment.  Side effects from your medicines.  Chills or a fever.  Burning pain when you urinate.  Pain in your lower back or belly. Get help right away if you:  Have severe or uncontrolled pain.  Cannot urinate. Summary  Gout is painful swelling of the joints caused by inflammation.  The most common site of pain is the big toe, but it can affect other joints in the body.  Medicines and dietary changes can help to prevent and treat gout attacks. This information is not intended to replace advice given to you by  your health care provider. Make sure you discuss any questions you have with your health care provider. Document Released: 05/11/2000 Document Revised: 12/04/2017 Document Reviewed: 12/04/2017 Elsevier Interactive Patient Education  [employer].     If you have lab work done today you will be contacted with your lab results within the next 2 weeks.  If you have not heard from [place] then please contact us. The fastest way to get your results is to register for My Chart.   IF you received an x-ray today, you will receive an invoice from [facility]. Please contact [facility] at [phone] with questions or concerns regarding your invoice.   IF you received labwork today, you will receive an invoice from [place]. Please contact [facility] at [phone] with questions or concerns regarding your invoice.   Our billing staff will not be able to assist you with questions regarding bills from these companies.  You will be contacted with the lab results as soon as they are available. The fastest way to get your results is to activate your My Chart  account. Instructions are located on the last page of this paperwork. If you have not heard from [place] regarding the results in 2 weeks, please contact this office.

## 2018-08-21 ENCOUNTER — Other Ambulatory Visit: Payer: Self-pay

## 2018-08-21 ENCOUNTER — Other Ambulatory Visit: Payer: Self-pay | Admitting: Family Medicine

## 2018-08-21 ENCOUNTER — Ambulatory Visit: Payer: Self-pay

## 2018-08-21 DIAGNOSIS — M25511 Pain in right shoulder: Secondary | ICD-10-CM

## 2018-09-16 ENCOUNTER — Other Ambulatory Visit: Payer: Self-pay

## 2018-09-16 ENCOUNTER — Telehealth: Payer: 59 | Admitting: Family Medicine

## 2018-09-19 DIAGNOSIS — S46019A Strain of muscle(s) and tendon(s) of the rotator cuff of unspecified shoulder, initial encounter: Secondary | ICD-10-CM | POA: Insufficient documentation

## 2018-10-03 DIAGNOSIS — M75120 Complete rotator cuff tear or rupture of unspecified shoulder, not specified as traumatic: Secondary | ICD-10-CM | POA: Insufficient documentation

## 2018-10-16 DIAGNOSIS — M25611 Stiffness of right shoulder, not elsewhere classified: Secondary | ICD-10-CM | POA: Insufficient documentation

## 2018-10-16 DIAGNOSIS — M25511 Pain in right shoulder: Secondary | ICD-10-CM | POA: Insufficient documentation

## 2018-11-25 DIAGNOSIS — Z4789 Encounter for other orthopedic aftercare: Secondary | ICD-10-CM | POA: Insufficient documentation

## 2018-12-09 ENCOUNTER — Encounter: Payer: Self-pay | Admitting: Gastroenterology

## 2019-02-03 IMAGING — DX DG KNEE COMPLETE 4+V*L*
4 series · 4 of 4 positions shown · non-contrast
Comparison: 12/16/2006

CLINICAL DATA: intermittent knee pain x 2 years, will flare ups of
swelling and worsening pain. suspect OA

EXAM:
LEFT KNEE - COMPLETE 4+ VIEW

[knee ap]
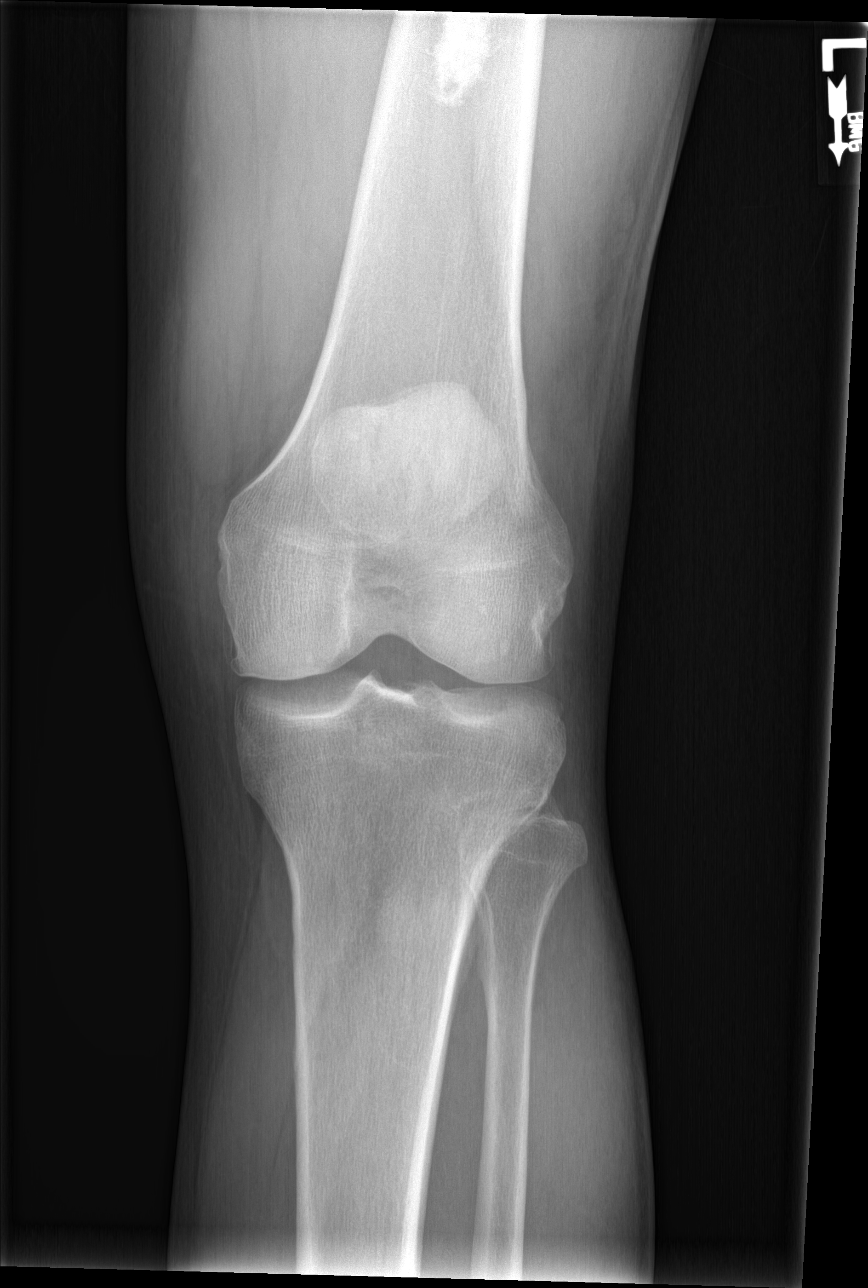

[knee lat]
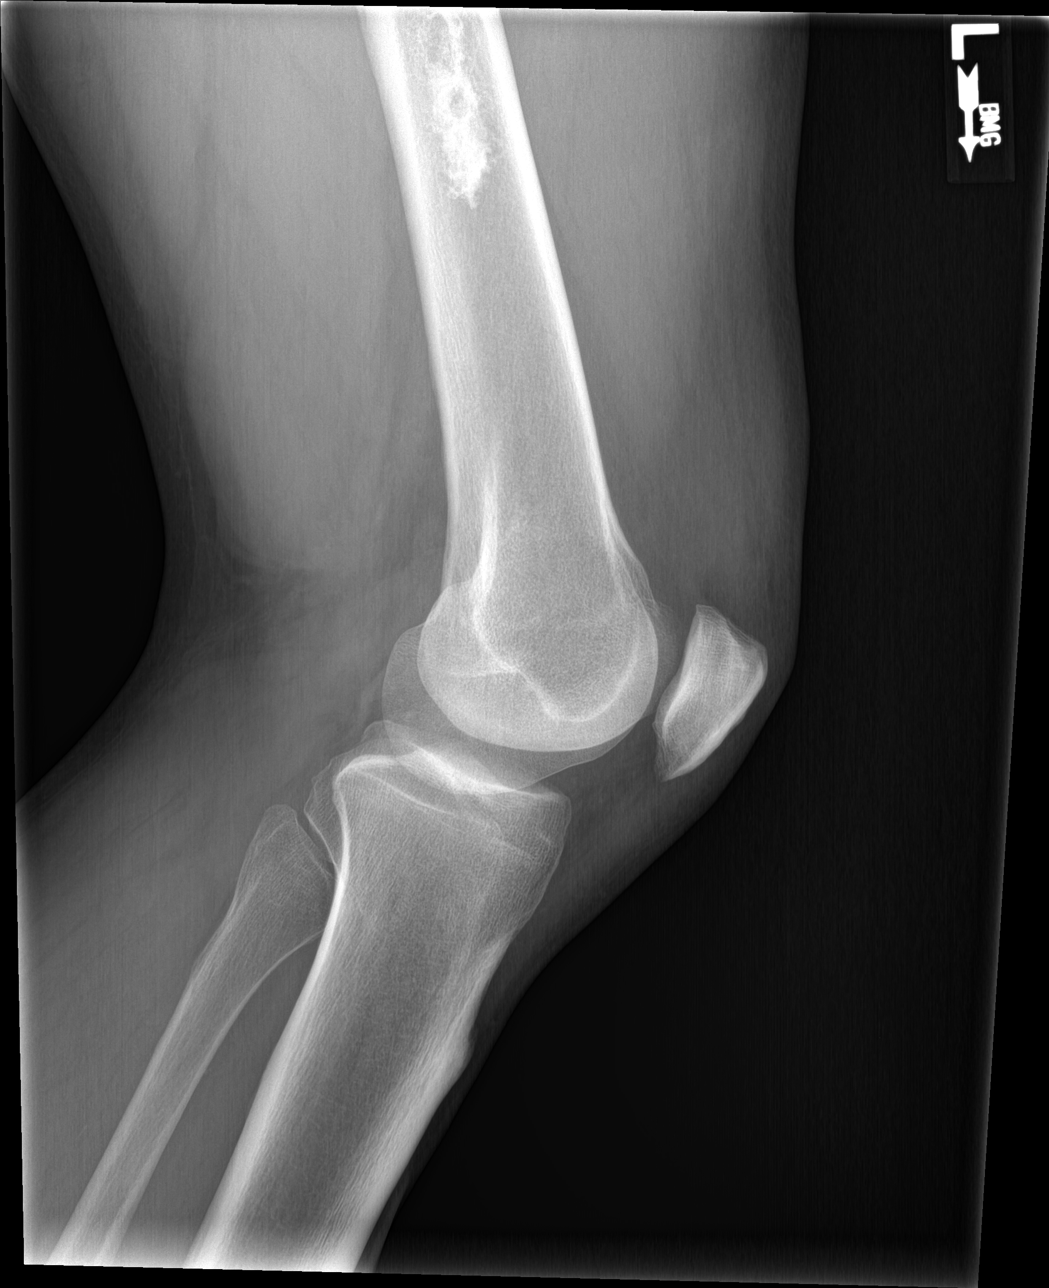

[sunrise]
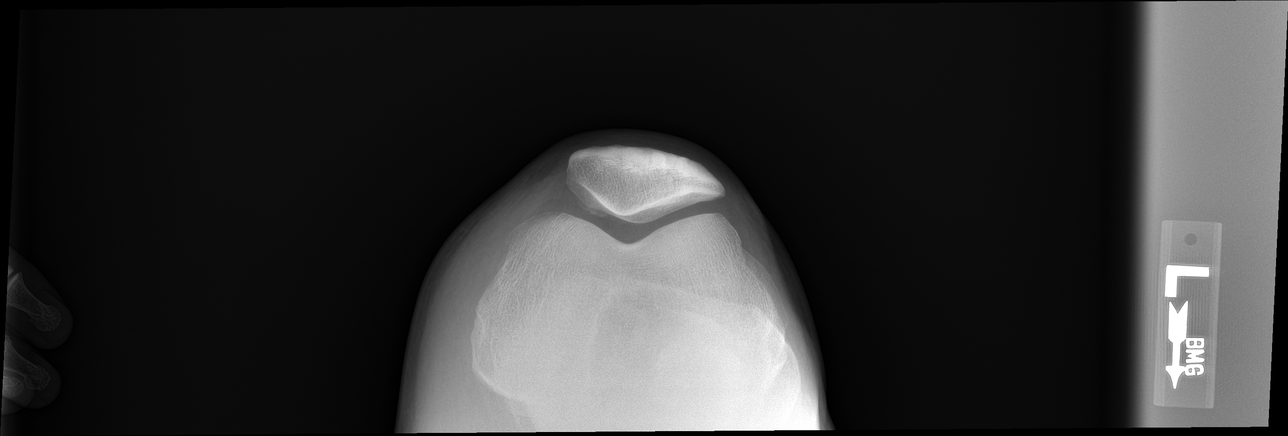

[knee [person_name]]
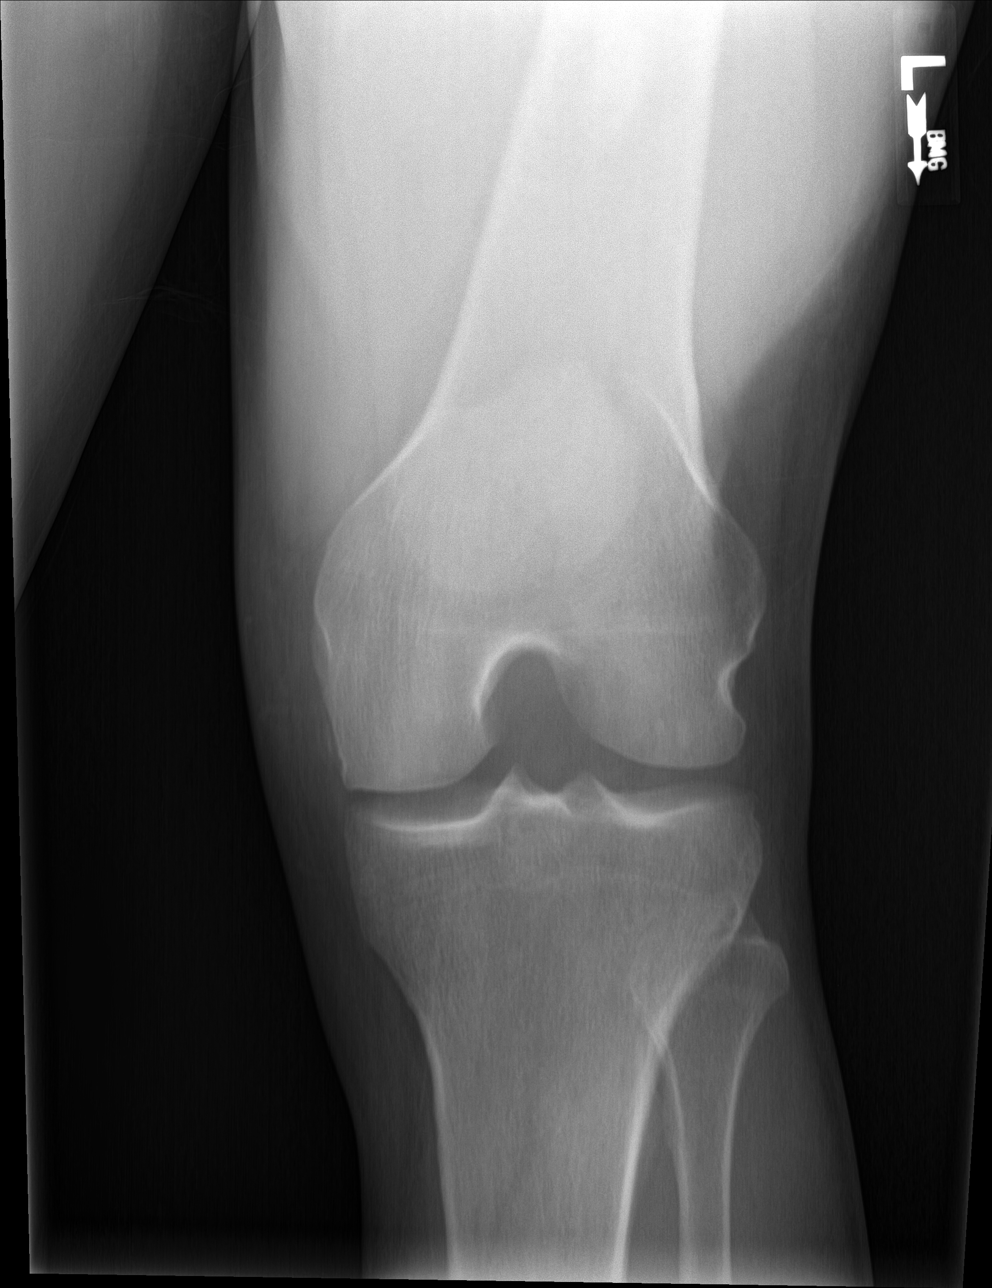

[4 of 4 positions shown; findings below may reference images not displayed]

FINDINGS: No fracture. Knee joint is normally spaced and aligned. There small
marginal osteophytes from the medial femoral condyle. No other
degenerative change. No joint effusion.

There is a sclerotic lesion in the medullary cavity of the distal
femoral shaft, incompletely imaged. This is likely an enchondroma.
This was not included on the field of view from the prior knee
radiographs.
IMPRESSION: 1. No fracture or acute finding.
2. Minor osteoarthritis with marginal osteophytes from the medial
femoral condyle.
3. No joint effusion.
4. Incompletely imaged bone lesion in the distal femoral shaft.
Although this is likely a benign lesion such as a calcified
enchondroma, given the lack of prior studies to establish stability
and patient's age, follow-up dedicated left femur radiographs are
recommended to fully assess the lesion.

## 2019-03-04 ENCOUNTER — Other Ambulatory Visit: Payer: Self-pay

## 2019-03-04 DIAGNOSIS — Z20822 Contact with and (suspected) exposure to covid-19: Secondary | ICD-10-CM

## 2019-03-05 ENCOUNTER — Ambulatory Visit: Payer: 59 | Admitting: Gastroenterology

## 2019-03-06 ENCOUNTER — Encounter: Payer: 59 | Admitting: Registered Nurse

## 2019-03-06 LAB — NOVEL CORONAVIRUS, NAA: SARS-CoV-2, NAA: NOT DETECTED

## 2019-03-13 ENCOUNTER — Encounter: Payer: 59 | Admitting: Registered Nurse

## 2019-03-16 ENCOUNTER — Encounter: Payer: Self-pay | Admitting: Registered Nurse

## 2019-04-10 ENCOUNTER — Ambulatory Visit: Payer: 59 | Admitting: Gastroenterology

## 2019-06-18 ENCOUNTER — Telehealth: Payer: Self-pay | Admitting: Family Medicine

## 2019-06-18 NOTE — Telephone Encounter (Signed)
Request for medical records faxed in from [place] firm. pprwrk sent to [facility]

## 2019-08-07 ENCOUNTER — Ambulatory Visit: Payer: Self-pay | Attending: Internal Medicine

## 2019-08-07 DIAGNOSIS — Z23 Encounter for immunization: Secondary | ICD-10-CM

## 2019-08-07 NOTE — Progress Notes (Signed)
   Covid-19 Vaccination Clinic  Name:  Randy Wilson    MRN: [id number] DOB: 1965-08-16  08/07/2019  [name] was observed post Covid-19 immunization for 15 minutes without incident. He was provided with Vaccine Information Sheet and instruction to access the V-Safe system.   Mr. Rounds was instructed to call 911 with any severe reactions post vaccine: Marland Kitchen Difficulty breathing  . Swelling of face and throat  . A fast heartbeat  . A bad rash all over body  . Dizziness and weakness   Immunizations Administered    Name Date Dose VIS Date Route   Pfizer COVID-19 Vaccine 08/07/2019  9:21 AM 0.3 mL 05/08/2019 Intramuscular   Manufacturer: [place]   Lot: [id number]   [place]: [address]-4

## 2019-08-31 ENCOUNTER — Ambulatory Visit: Payer: Self-pay | Attending: Internal Medicine

## 2019-08-31 DIAGNOSIS — Z23 Encounter for immunization: Secondary | ICD-10-CM

## 2019-08-31 NOTE — Progress Notes (Signed)
   Covid-19 Vaccination Clinic  Name:  JAIDEV SANGER    MRN: [id number] DOB: [birth year]  08/31/2019  Mr. Hitchens was observed post Covid-19 immunization for 15 minutes without incident. He was provided with Vaccine Information Sheet and instruction to access the V-Safe system.   Mr. Blades was instructed to call 911 with any severe reactions post vaccine: Marland Kitchen Difficulty breathing  . Swelling of face and throat  . A fast heartbeat  . A bad rash all over body  . Dizziness and weakness   Immunizations Administered    Name Date Dose VIS Date Route   Pfizer COVID-19 Vaccine 08/31/2019 11:52 AM 0.3 mL 05/08/2019 Intramuscular   Manufacturer: [place]   Lot: [id number]   [place]: [id number]

## 2019-10-27 ENCOUNTER — Ambulatory Visit: Payer: Self-pay | Admitting: Gastroenterology

## 2019-11-13 ENCOUNTER — Telehealth: Payer: Self-pay | Admitting: General Practice

## 2019-11-13 NOTE — Telephone Encounter (Signed)
This is true I am not taking new pts but see all the relatives  Willing to take him on as new patient with this exception only   Please schedule new patient appt   Thanks [place]

## 2019-11-13 NOTE — Telephone Encounter (Signed)
Pt is very persistent on getting a new patient appointment with you. He said his wife Pradeep Beaubrun, his daughter Tresa Moore, and brother-in-law sees you. He said he refuses to see anyone other than you. I kept letting him know you were not accepting new patients. He asked if I would send you a message. Please let me know if you will accept him as a new patient?

## 2019-11-16 NOTE — Telephone Encounter (Signed)
Lm to call office to set up new patient appt with Dr. Faylene Kurtz by her.

## 2019-11-18 ENCOUNTER — Ambulatory Visit: Payer: Self-pay | Admitting: Registered Nurse

## 2019-11-18 NOTE — Telephone Encounter (Signed)
Also left voicemail for patient to call back and schedule new patient appointment.

## 2019-12-07 ENCOUNTER — Encounter: Payer: Self-pay | Admitting: Registered Nurse

## 2019-12-07 ENCOUNTER — Ambulatory Visit: Payer: BC Managed Care – PPO | Admitting: Registered Nurse

## 2019-12-07 ENCOUNTER — Other Ambulatory Visit: Payer: Self-pay

## 2019-12-07 DIAGNOSIS — Z8739 Personal history of other diseases of the musculoskeletal system and connective tissue: Secondary | ICD-10-CM | POA: Diagnosis not present

## 2019-12-07 DIAGNOSIS — M109 Gout, unspecified: Secondary | ICD-10-CM | POA: Diagnosis not present

## 2019-12-07 MED ORDER — PREDNISONE 20 MG PO TABS: ORAL_TABLET | ORAL | 0 refills | Status: AC

## 2019-12-07 NOTE — Patient Instructions (Signed)
° ° ° °  If you have lab work done today you will be contacted with your lab results within the next 2 weeks.  If you have not heard from us then please contact us. The fastest way to get your results is to register for My Chart. ° ° °IF you received an x-ray today, you will receive an invoice from [facility]. Please contact [facility] at [phone] with questions or concerns regarding your invoice.  ° °IF you received labwork today, you will receive an invoice from [facility]. Please contact [facility] at [phone] with questions or concerns regarding your invoice.  ° °Our billing staff will not be able to assist you with questions regarding bills from these companies. ° °You will be contacted with the lab results as soon as they are available. The fastest way to get your results is to activate your My Chart account. Instructions are located on the last page of this paperwork. If you have not heard from us regarding the results in 2 weeks, please contact this office. °  ° ° ° °

## 2019-12-07 NOTE — Progress Notes (Signed)
Acute Office Visit  Subjective:    Patient ID: Randy Wilson, male    DOB: 1966/05/15, 54 y.o.   MRN: [id number]  Chief Complaint  Patient presents with  . Toe Pain    Patient states he has been having some pain and swelling in his right big toe since friday. Per patient he believes it's gout and have been taking advil for pain but no relief    HPI Patient is in today for gout flare.  R big toe. Has happened before. Started 2-3 weeks ago but now unbearable. Has been using OTC nsaids with limited effect. Having trouble getting shoes on and sleeping due to pain. No radiation of pain. Toe joint is swollen, red, warm, ttp.  No other concerns  Past Medical History:  Diagnosis Date  . Allergy   . Asthma AS CHILD   NONE NOW  . Chronic enteritis   . Hemorrhoids   . Inflammatory polyps of colon ([place])   . Rectal bleeding NONE IN LAST 3 MONTHS  . Shoulder pain    RIGHT    Past Surgical History:  Procedure Laterality Date  . ANKLE SURGERY Left 2002   bone spurs removed  . COLONOSCOPY    . COLONSCOPY     X 4  . LAPAROSCOPIC ILEOCECECTOMY N/A 11/02/2015   Procedure: LAPAROSCOPIC ILEOCECECTOMY;  Surgeon: Leighton Ruff, MD;  Location: [facility];  Service: General;  Laterality: N/A;  . POLYPECTOMY    . SHOULDER SURGERY Left 2010   bone spurs removed     Family History  Problem Relation Age of Onset  . Asthma Mother 71       asthma attack   . Stroke Brother        Myocardial infarction  . Stroke Brother   . Asthma Sister   . Colon cancer Neg Hx   . Stomach cancer Neg Hx   . Rectal cancer Neg Hx   . Esophageal cancer Neg Hx   . Colon polyps Neg Hx   . Allergic rhinitis Neg Hx   . Angioedema Neg Hx   . Eczema Neg Hx   . Urticaria Neg Hx   . Liver cancer Neg Hx   . Pancreatic cancer Neg Hx     Social History   Socioeconomic History  . Marital status: Married    Spouse name: Kristeen Miss   . Number of children: 3  . Years of education: Not on file  . Highest education level:  High school graduate  Occupational History  . Occupation: Recieving Clerk    Comment: [employer]  Tobacco Use  . Smoking status: Former Smoker    Packs/day: 1.00    Years: 25.00    Pack years: 25.00    Types: Cigarettes    Quit date: 01/21/2006    Years since quitting: 13.8  . Smokeless tobacco: Never Used  Vaping Use  . Vaping Use: Never used  Substance and Sexual Activity  . Alcohol use: Yes    Alcohol/week: 6.0 standard drinks    Types: 6 Cans of beer per week    Comment: OCC  . Drug use: No    Types: Marijuana  . Sexual activity: Yes    Comment: with monogamous wife, she takes OCP  Other Topics Concern  . Not on file  Social History Narrative   Pt is from [place].    Social Determinants of Health   Financial Resource Strain:   . Difficulty of Paying Living Expenses:   Food Insecurity:   .  Worried About Charity fundraiser in the Last Year:   . Arboriculturist in the Last Year:   Transportation Needs:   . Film/video editor (Medical):   Marland Kitchen Lack of Transportation (Non-Medical):   Physical Activity:   . Days of Exercise per Week:   . Minutes of Exercise per Session:   Stress:   . Feeling of Stress :   Social Connections:   . Frequency of Communication with Friends and Family:   . Frequency of Social Gatherings with Friends and Family:   . Attends Religious Services:   . Active Member of Clubs or Organizations:   . Attends Archivist Meetings:   Marland Kitchen Marital Status:   Intimate Partner Violence:   . Fear of Current or Ex-Partner:   . Emotionally Abused:   Marland Kitchen Physically Abused:   . Sexually Abused:     Outpatient Medications Prior to Visit  Medication Sig Dispense Refill  . cetirizine (ZYRTEC) 10 MG tablet Take 1 tablet (10 mg total) by mouth daily. 30 tablet 11  . indomethacin (INDOCIN) 50 MG capsule Start 50 mg 3 times daily; initiate within 24 to 48 hours of flare onset preferably; discontinue 2 to 3 days after resolution of clinical signs; usual  duration: 5 to 7 days 30 capsule 0  . predniSONE (DELTASONE) 20 MG tablet Take 2 tablets (40 mg total) by mouth daily with breakfast. (Patient not taking: Reported on 12/07/2019) 10 tablet 0   No facility-administered medications prior to visit.    Allergies  Allergen Reactions  . Tramadol Itching  . Shrimp [Shellfish Allergy] Hives    Review of Systems  Constitutional: Negative.   HENT: Negative.   Eyes: Negative.   Respiratory: Negative.   Cardiovascular: Negative.   Gastrointestinal: Negative.   Endocrine: Negative.   Genitourinary: Negative.   Musculoskeletal: Positive for arthralgias. Negative for back pain, gait problem, joint swelling, myalgias, neck pain and neck stiffness.  Skin: Negative.   Allergic/Immunologic: Negative.   Neurological: Negative.   Hematological: Negative.   Psychiatric/Behavioral: Negative.   All other systems reviewed and are negative.      Objective:    Physical Exam Constitutional:      General: He is not in acute distress.    Appearance: Normal appearance. He is obese. He is not ill-appearing, toxic-appearing or diaphoretic.  Cardiovascular:     Rate and Rhythm: Normal rate and regular rhythm.  Musculoskeletal:        General: Swelling (r great toe) and tenderness (r great toe) present. No deformity or signs of injury.     Right lower leg: No edema.     Left lower leg: No edema.  Skin:    General: Skin is warm and dry.     Coloration: Skin is not jaundiced or pale.     Findings: Erythema (r great toe) present. No bruising, lesion or rash.  Neurological:     General: No focal deficit present.     Mental Status: He is alert and oriented to person, place, and time. Mental status is at baseline.  Psychiatric:        Mood and Affect: Mood normal.        Behavior: Behavior normal.        Thought Content: Thought content normal.        Judgment: Judgment normal.     BP 121/81   Pulse 61   Temp 98 F (36.7 C) (Temporal)   Resp 17  Ht 6' 1"  (1.854 m)   Wt 233 lb (105.7 kg)   SpO2 100%   BMI 30.74 kg/m  Wt Readings from Last 3 Encounters:  12/07/19 233 lb (105.7 kg)  08/18/18 239 lb 3.2 oz (108.5 kg)  07/09/18 236 lb 3.2 oz (107.1 kg)    There are no preventive care reminders to display for this patient.  There are no preventive care reminders to display for this patient.   Lab Results  Component Value Date   TSH 1.830 10/24/2017   Lab Results  Component Value Date   WBC 5.9 10/24/2017   HGB 14.4 10/24/2017   HCT 42.6 10/24/2017   MCV 86 10/24/2017   PLT 266 10/24/2017   Lab Results  Component Value Date   NA 140 10/24/2017   K 4.3 10/24/2017   CO2 19 (L) 10/24/2017   GLUCOSE 88 10/24/2017   BUN 13 10/24/2017   CREATININE 1.13 10/24/2017   BILITOT 0.6 10/24/2017   ALKPHOS 72 10/24/2017   AST 20 10/24/2017   ALT 21 10/24/2017   PROT 7.5 10/24/2017   ALBUMIN 4.6 10/24/2017   CALCIUM 9.4 10/24/2017   ANIONGAP 7 11/05/2015   Lab Results  Component Value Date   CHOL 179 10/24/2017   Lab Results  Component Value Date   HDL 50 10/24/2017   Lab Results  Component Value Date   LDLCALC 115 (H) 10/24/2017   Lab Results  Component Value Date   TRIG 72 10/24/2017   Lab Results  Component Value Date   CHOLHDL 3.6 10/24/2017   Lab Results  Component Value Date   HGBA1C 5.5 05/02/2016       Assessment & Plan:   Problem List Items Addressed This Visit    None    Visit Diagnoses    History of gout       Relevant Medications   predniSONE (DELTASONE) 20 MG tablet   Other Relevant Orders   Uric Acid   Comprehensive metabolic panel   Podagra       Relevant Medications   predniSONE (DELTASONE) 20 MG tablet       Meds ordered this encounter  Medications  . predniSONE (DELTASONE) 20 MG tablet    Sig: Take 3 tablets (60 mg total) by mouth daily with breakfast for 2 days, THEN 2 tablets (40 mg total) daily with breakfast for 2 days, THEN 1 tablet (20 mg total) daily with  breakfast for 2 days.    Dispense:  12 tablet    Refill:  0    Order Specific Question:   Supervising Provider    Answer:   Carlota Raspberry, JEFFREY R [[id number]]   PLAN  Flare up of gout  Prednisone taper  Continue otc NSAIDs  Collecting cmp and uric acid - will consider starting allopurinol therapy if uric acid elevated  Patient encouraged to call clinic with any questions, comments, or concerns.   Maximiano Coss, NP

## 2019-12-08 LAB — COMPREHENSIVE METABOLIC PANEL
ALT: 13 IU/L (ref 0–44)
AST: 11 IU/L (ref 0–40)
Albumin/Globulin Ratio: 1.7 (ref 1.2–2.2)
Albumin: 4.4 g/dL (ref 3.8–4.9)
Alkaline Phosphatase: 84 IU/L (ref 48–121)
BUN/Creatinine Ratio: 14 (ref 9–20)
BUN: 13 mg/dL (ref 6–24)
Bilirubin Total: 0.4 mg/dL (ref 0.0–1.2)
CO2: 22 mmol/L (ref 20–29)
Calcium: 9.1 mg/dL (ref 8.7–10.2)
Chloride: 104 mmol/L (ref 96–106)
Creatinine, Ser: 0.94 mg/dL (ref 0.76–1.27)
GFR calc Af Amer: 104 mL/min/{1.73_m2} (ref >59)
GFR calc non Af Amer: 90 mL/min/{1.73_m2} (ref >59)
Globulin, Total: 2.6 g/dL (ref 1.5–4.5)
Glucose: 95 mg/dL (ref 65–99)
Potassium: 4.5 mmol/L (ref 3.5–5.2)
Sodium: 140 mmol/L (ref 134–144)
Total Protein: 7 g/dL (ref 6.0–8.5)

## 2019-12-08 LAB — URIC ACID: Uric Acid: 3.8 mg/dL (ref 3.8–8.4)

## 2019-12-28 ENCOUNTER — Encounter: Payer: Self-pay | Admitting: Registered Nurse

## 2020-01-06 ENCOUNTER — Ambulatory Visit (INDEPENDENT_AMBULATORY_CARE_PROVIDER_SITE_OTHER): Payer: BC Managed Care – PPO | Admitting: Registered Nurse

## 2020-01-06 ENCOUNTER — Encounter: Payer: Self-pay | Admitting: Registered Nurse

## 2020-01-06 ENCOUNTER — Other Ambulatory Visit: Payer: Self-pay

## 2020-01-06 DIAGNOSIS — Z8739 Personal history of other diseases of the musculoskeletal system and connective tissue: Secondary | ICD-10-CM

## 2020-01-06 MED ORDER — INDOMETHACIN 50 MG PO CAPS: ORAL_CAPSULE | ORAL | 0 refills | Status: DC

## 2020-01-06 MED ORDER — ALLOPURINOL 100 MG PO TABS: 100.0000 mg | ORAL_TABLET | Freq: Every day | ORAL | 3 refills | Status: DC

## 2020-01-06 MED ORDER — PREDNISONE 10 MG (21) PO TBPK: ORAL_TABLET | ORAL | 0 refills | Status: DC

## 2020-01-06 NOTE — Patient Instructions (Signed)
° ° ° °  If you have lab work done today you will be contacted with your lab results within the next 2 weeks.  If you have not heard from us then please contact us. The fastest way to get your results is to register for My Chart. ° ° °IF you received an x-ray today, you will receive an invoice from [facility]. Please contact  Radiology at [phone] with questions or concerns regarding your invoice.  ° °IF you received labwork today, you will receive an invoice from [facility]. Please contact [facility] at [phone] with questions or concerns regarding your invoice.  ° °Our billing staff will not be able to assist you with questions regarding bills from these companies. ° °You will be contacted with the lab results as soon as they are available. The fastest way to get your results is to activate your My Chart account. Instructions are located on the last page of this paperwork. If you have not heard from us regarding the results in 2 weeks, please contact this office. °  ° ° ° °

## 2020-01-06 NOTE — Progress Notes (Signed)
Acute Office Visit  Subjective:    Patient ID: Randy Wilson, male    DOB: [birth year], 54 y.o.   MRN: [id number]  Chief Complaint  Patient presents with  . Toe Pain    Pt stated that he has been having some pain in his Rt big toe for the past 3 days and it gets to the point where he can hardly walk. He has been Dx with gut in the past.    HPI Patient is in today for R great toe pain Onset Sunday Worsening Swelling, red, warm. Limited to joint of great toe Classic gout presentation for him Has been off allopurinol given his Uric Acid of 3.8 at last visit  Did recently take a vacation - subpar diet, alcohol intake, much walking, likely dehydrated - acknowledges that these likely contributed to his current symptoms.  No other concerns at this time   Past Medical History:  Diagnosis Date  . Allergy   . Asthma AS CHILD   NONE NOW  . Chronic enteritis   . Hemorrhoids   . Inflammatory polyps of colon ([place])   . Rectal bleeding NONE IN LAST 3 MONTHS  . Shoulder pain    RIGHT    Past Surgical History:  Procedure Laterality Date  . ANKLE SURGERY Left 2002   bone spurs removed  . COLONOSCOPY    . COLONSCOPY     X 4  . LAPAROSCOPIC ILEOCECECTOMY N/A 11/02/2015   Procedure: LAPAROSCOPIC ILEOCECECTOMY;  Surgeon: Leighton Ruff, MD;  Location: [facility];  Service: General;  Laterality: N/A;  . POLYPECTOMY    . SHOULDER SURGERY Left 2010   bone spurs removed     Family History  Problem Relation Age of Onset  . Asthma Mother 53       asthma attack   . Stroke Brother        Myocardial infarction  . Stroke Brother   . Asthma Sister   . Colon cancer Neg Hx   . Stomach cancer Neg Hx   . Rectal cancer Neg Hx   . Esophageal cancer Neg Hx   . Colon polyps Neg Hx   . Allergic rhinitis Neg Hx   . Angioedema Neg Hx   . Eczema Neg Hx   . Urticaria Neg Hx   . Liver cancer Neg Hx   . Pancreatic cancer Neg Hx     Social History   Socioeconomic History  . Marital status: Married      Spouse name: Kristeen Miss   . Number of children: 3  . Years of education: Not on file  . Highest education level: High school graduate  Occupational History  . Occupation: Recieving Clerk    Comment: [employer]  Tobacco Use  . Smoking status: Former Smoker    Packs/day: 1.00    Years: 25.00    Pack years: 25.00    Types: Cigarettes    Quit date: 01/21/2006    Years since quitting: 13.9  . Smokeless tobacco: Never Used  Vaping Use  . Vaping Use: Never used  Substance and Sexual Activity  . Alcohol use: Yes    Alcohol/week: 6.0 standard drinks    Types: 6 Cans of beer per week    Comment: OCC  . Drug use: No    Types: Marijuana  . Sexual activity: Yes    Comment: with monogamous wife, she takes OCP  Other Topics Concern  . Not on file  Social History Narrative   Pt is from  [place].    Social Determinants of Health   Financial Resource Strain:   . Difficulty of Paying Living Expenses:   Food Insecurity:   . Worried About Charity fundraiser in the Last Year:   . Arboriculturist in the Last Year:   Transportation Needs:   . Film/video editor (Medical):   Marland Kitchen Lack of Transportation (Non-Medical):   Physical Activity:   . Days of Exercise per Week:   . Minutes of Exercise per Session:   Stress:   . Feeling of Stress :   Social Connections:   . Frequency of Communication with Friends and Family:   . Frequency of Social Gatherings with Friends and Family:   . Attends Religious Services:   . Active Member of Clubs or Organizations:   . Attends Archivist Meetings:   Marland Kitchen Marital Status:   Intimate Partner Violence:   . Fear of Current or Ex-Partner:   . Emotionally Abused:   Marland Kitchen Physically Abused:   . Sexually Abused:     Outpatient Medications Prior to Visit  Medication Sig Dispense Refill  . cetirizine (ZYRTEC) 10 MG tablet Take 1 tablet (10 mg total) by mouth daily. 30 tablet 11  . indomethacin (INDOCIN) 50 MG capsule Start 50 mg 3 times daily; initiate  within 24 to 48 hours of flare onset preferably; discontinue 2 to 3 days after resolution of clinical signs; usual duration: 5 to 7 days 30 capsule 0   No facility-administered medications prior to visit.    Allergies  Allergen Reactions  . Tramadol Itching  . Shrimp [Shellfish Allergy] Hives    Review of Systems Pertinent negaties and positive per hpi    Objective:    Physical Exam Vitals and nursing note reviewed.  Constitutional:      General: He is not in acute distress.    Appearance: Normal appearance. He is normal weight. He is not ill-appearing, toxic-appearing or diaphoretic.  Cardiovascular:     Rate and Rhythm: Normal rate and regular rhythm.  Pulmonary:     Effort: Pulmonary effort is normal. No respiratory distress.  Musculoskeletal:        General: Swelling (r great toe) and tenderness (r great toe) present.  Skin:    General: Skin is warm and dry.     Capillary Refill: Capillary refill takes less than 2 seconds.     Coloration: Skin is not jaundiced or pale.     Findings: Erythema (joint of R great toe) present. No bruising, lesion or rash.  Neurological:     General: No focal deficit present.     Mental Status: He is alert and oriented to person, place, and time. Mental status is at baseline.  Psychiatric:        Mood and Affect: Mood normal.        Behavior: Behavior normal.        Thought Content: Thought content normal.        Judgment: Judgment normal.     BP 122/70 (BP Location: Right Arm, Patient Position: Sitting, Cuff Size: Large)   Pulse 66   Temp 98.3 F (36.8 C) (Temporal)   Ht 6' 2"  (1.88 m)   Wt 227 lb (103 kg)   SpO2 95%   BMI 29.15 kg/m  Wt Readings from Last 3 Encounters:  01/06/20 227 lb (103 kg)  12/07/19 233 lb (105.7 kg)  08/18/18 239 lb 3.2 oz (108.5 kg)    Health Maintenance Due  Topic  Date Due  . INFLUENZA VACCINE  12/27/2019    There are no preventive care reminders to display for this patient.   Lab Results    Component Value Date   TSH 1.830 10/24/2017   Lab Results  Component Value Date   WBC 5.9 10/24/2017   HGB 14.4 10/24/2017   HCT 42.6 10/24/2017   MCV 86 10/24/2017   PLT 266 10/24/2017   Lab Results  Component Value Date   NA 140 12/07/2019   K 4.5 12/07/2019   CO2 22 12/07/2019   GLUCOSE 95 12/07/2019   BUN 13 12/07/2019   CREATININE 0.94 12/07/2019   BILITOT 0.4 12/07/2019   ALKPHOS 84 12/07/2019   AST 11 12/07/2019   ALT 13 12/07/2019   PROT 7.0 12/07/2019   ALBUMIN 4.4 12/07/2019   CALCIUM 9.1 12/07/2019   ANIONGAP 7 11/05/2015   Lab Results  Component Value Date   CHOL 179 10/24/2017   Lab Results  Component Value Date   HDL 50 10/24/2017   Lab Results  Component Value Date   LDLCALC 115 (H) 10/24/2017   Lab Results  Component Value Date   TRIG 72 10/24/2017   Lab Results  Component Value Date   CHOLHDL 3.6 10/24/2017   Lab Results  Component Value Date   HGBA1C 5.5 05/02/2016       Assessment & Plan:   Problem List Items Addressed This Visit    None    Visit Diagnoses    History of gout       Relevant Medications   predniSONE (STERAPRED UNI-PAK 21 TAB) 10 MG (21) TBPK tablet   indomethacin (INDOCIN) 50 MG capsule   allopurinol (ZYLOPRIM) 100 MG tablet       Meds ordered this encounter  Medications  . predniSONE (STERAPRED UNI-PAK 21 TAB) 10 MG (21) TBPK tablet    Sig: Take per package instructions. Do not skip doses. Finish entire supply.    Dispense:  1 each    Refill:  0    Order Specific Question:   Supervising Provider    Answer:   Carlota Raspberry, JEFFREY R [[id number]]  . indomethacin (INDOCIN) 50 MG capsule    Sig: Start 50 mg 3 times daily; initiate within 24 to 48 hours of flare onset preferably; discontinue 2 to 3 days after resolution of clinical signs; usual duration: 5 to 7 days    Dispense:  30 capsule    Refill:  0    Order Specific Question:   Supervising Provider    Answer:   Carlota Raspberry, JEFFREY R [[id number]]  . allopurinol  (ZYLOPRIM) 100 MG tablet    Sig: Take 1 tablet (100 mg total) by mouth daily.    Dispense:  90 tablet    Refill:  3    Order Specific Question:   Supervising Provider    Answer:   Carlota Raspberry, JEFFREY R [[id number]]   PLAN  Typical gout flare on exam  Prednisone taper  Indomethacin 20m PO tid PRN  Allopurinol 1076mPO qd after flare resolves completely  May consider podiatry or ortho referral if consistent flare ups resume, but I suspect this is related to his recent vacation and don't anticipate that he'll have continuing flares of this type.  Patient encouraged to call clinic with any questions, comments, or concerns.  RiMaximiano CossNP

## 2020-01-08 ENCOUNTER — Ambulatory Visit: Payer: BC Managed Care – PPO | Admitting: Gastroenterology

## 2020-01-08 ENCOUNTER — Encounter: Payer: Self-pay | Admitting: Gastroenterology

## 2020-01-08 ENCOUNTER — Other Ambulatory Visit (INDEPENDENT_AMBULATORY_CARE_PROVIDER_SITE_OTHER): Payer: BC Managed Care – PPO

## 2020-01-08 VITALS — BP 118/68 | HR 85 | Ht 74.0 in | Wt 228.0 lb

## 2020-01-08 DIAGNOSIS — K5000 Crohn's disease of small intestine without complications: Secondary | ICD-10-CM | POA: Diagnosis not present

## 2020-01-08 LAB — CBC WITH DIFFERENTIAL/PLATELET
Basophils Absolute: 0 10*3/uL (ref 0.0–0.1)
Basophils Relative: 0.2 % (ref 0.0–3.0)
Eosinophils Absolute: 0.1 10*3/uL (ref 0.0–0.7)
Eosinophils Relative: 0.6 % (ref 0.0–5.0)
HCT: 44.1 % (ref 39.0–52.0)
Hemoglobin: 15 g/dL (ref 13.0–17.0)
Lymphocytes Relative: 9.9 % — ABNORMAL LOW (ref 12.0–46.0)
Lymphs Abs: 0.9 10*3/uL (ref 0.7–4.0)
MCHC: 34 g/dL (ref 30.0–36.0)
MCV: 86.3 fl (ref 78.0–100.0)
Monocytes Absolute: 0.3 10*3/uL (ref 0.1–1.0)
Monocytes Relative: 2.7 % — ABNORMAL LOW (ref 3.0–12.0)
Neutro Abs: 7.9 10*3/uL — ABNORMAL HIGH (ref 1.4–7.7)
Neutrophils Relative %: 86.6 % — ABNORMAL HIGH (ref 43.0–77.0)
Platelets: 273 10*3/uL (ref 150.0–400.0)
RBC: 5.11 Mil/uL (ref 4.22–5.81)
RDW: 13.9 % (ref 11.5–15.5)
WBC: 9.1 10*3/uL (ref 4.0–10.5)

## 2020-01-08 LAB — COMPREHENSIVE METABOLIC PANEL
ALT: 18 U/L (ref 0–53)
AST: 15 U/L (ref 0–37)
Albumin: 4.8 g/dL (ref 3.5–5.2)
Alkaline Phosphatase: 71 U/L (ref 39–117)
BUN: 21 mg/dL (ref 6–23)
CO2: 25 mEq/L (ref 19–32)
Calcium: 9.8 mg/dL (ref 8.4–10.5)
Chloride: 103 mEq/L (ref 96–112)
Creatinine, Ser: 1.06 mg/dL (ref 0.40–1.50)
GFR: 87.28 mL/min (ref >60.00)
Glucose, Bld: 135 mg/dL — ABNORMAL HIGH (ref 70–99)
Potassium: 4.3 mEq/L (ref 3.5–5.1)
Sodium: 137 mEq/L (ref 135–145)
Total Bilirubin: 0.5 mg/dL (ref 0.2–1.2)
Total Protein: 7.9 g/dL (ref 6.0–8.3)

## 2020-01-08 LAB — SEDIMENTATION RATE: Sed Rate: 23 mm/hr — ABNORMAL HIGH (ref 0–20)

## 2020-01-08 MED ORDER — PLENVU 140 G PO SOLR: 140.0000 g | ORAL | 0 refills | Status: DC

## 2020-01-08 NOTE — Patient Instructions (Signed)
If you are age 56 or older, your body mass index should be between 23-30. Your Body mass index is 29.27 kg/m. If this is out of the aforementioned range listed, please consider follow up with your Primary Care Provider.  If you are age 46 or younger, your body mass index should be between 19-25. Your Body mass index is 29.27 kg/m. If this is out of the aformentioned range listed, please consider follow up with your Primary Care Provider.   You have been scheduled for a colonoscopy. Please follow written instructions given to you at your visit today.  Please pick up your prep supplies at the pharmacy within the next 1-3 days. If you use inhalers (even only as needed), please bring them with you on the day of your procedure.  Your provider has requested that you go to the basement level for lab work before leaving today. Press "B" on the elevator. The lab is located at the first door on the left as you exit the elevator.  Due to recent changes in healthcare laws, you may see the results of your imaging and laboratory studies on MyChart before your provider has had a chance to review them.  We understand that in some cases there may be results that are confusing or concerning to you. Not all laboratory results come back in the same time frame and the provider may be waiting for multiple results in order to interpret others.  Please give [place] 48 hours in order for your provider to thoroughly review all the results before contacting the office for clarification of your results.   It was a pleasure to see you today!  Dr. Loletha Carrow

## 2020-01-08 NOTE — Progress Notes (Signed)
[facility] Progress Note  Chief Complaint: Ileal Crohn's disease  Subjective  History: This is a follow-up after a long absence for Randy Wilson.  Briefly, I met him in early 2017 when he colonoscopy showed a large inflammatory polyp at the ileocecal valve.  It had been present a year prior on a colonoscopy by Dr. Deatra Ina.  Randy Wilson ultimately went to surgical resection, pathology consistent with Crohn's disease.  He then had a follow-up colonoscopy with me July 2018 showing a few shallow ulcers at the anastomosis, normal neoterminal ileum.  I recommended observation, then he had a repeat colonoscopy in July 2019 showing more severe changes consistent with recurrent Crohn's disease, including the neo-TI.  I saw him in follow-up after that and recommended Humira, which she was agreeable to.  He had pretreatment Pneumovax 23 (having had the pneumococcal 13 in 2019), and 2 doses of Twinrix before he was lost to follow-up during 2020.  Randy Wilson is glad to report that he has been feeling well, and denies abdominal pain, diarrhea or rectal bleeding. His appetite is good, and he is not having any chronic upper digestive symptoms. No episodes of painful eye redness, mouth sores or rash. He has been troubled intermittently by gout.  ROS: Cardiovascular:  no chest pain Respiratory: no dyspnea Remainder of systems negative except as above The patient's Past Medical, Family and Social History were reviewed and are on file in the EMR.  Objective:  Med list reviewed  Current Outpatient Medications:    allopurinol (ZYLOPRIM) 100 MG tablet, Take 1 tablet (100 mg total) by mouth daily., Disp: 90 tablet, Rfl: 3   cetirizine (ZYRTEC) 10 MG tablet, Take 1 tablet (10 mg total) by mouth daily., Disp: 30 tablet, Rfl: 11   indomethacin (INDOCIN) 50 MG capsule, Start 50 mg 3 times daily; initiate within 24 to 48 hours of flare onset preferably; discontinue 2 to 3 days after resolution of clinical signs; usual  duration: 5 to 7 days, Disp: 30 capsule, Rfl: 0   predniSONE (STERAPRED UNI-PAK 21 TAB) 10 MG (21) TBPK tablet, Take per package instructions. Do not skip doses. Finish entire supply., Disp: 1 each, Rfl: 0   PEG-KCl-NaCl-NaSulf-Na Asc-C (PLENVU) 140 g SOLR, Take 140 g by mouth as directed., Disp: 1 each, Rfl: 0   Vital signs in last 24 hrs: Vitals:   01/08/20 1356  BP: 118/68  Pulse: 85    Physical Exam  He is well-appearing  HEENT: sclera anicteric, oral mucosa moist without lesions  Neck: supple, no thyromegaly, JVD or lymphadenopathy  Cardiac: RRR without murmurs, S1S2 heard, no peripheral edema  Pulm: clear to auscultation bilaterally, normal RR and effort noted  Abdomen: soft, no tenderness, with active bowel sounds. No guarding or palpable hepatosplenomegaly.  Skin; warm and dry, no jaundice or rash  Labs:  No labs on file since 2019  ___________________________________________ Radiologic studies:   ____________________________________________ Other:   _____________________________________________ Assessment & Plan  Assessment: Encounter Diagnosis  Name Primary?   Crohn's disease of small intestine without complication ([place]) Yes   2 years ago he was known to have recurrent Crohn's disease at the anastomosis and in the neoterminal ileum. It is fortunate that he feels well at this point, but his disease is undoubtedly still active. He has reestablish care, needs repeat work-up to assess disease activity. I believe we will then plan to start him on combination therapy with Humira and azathioprine. I discussed that with him today and he is agreeable.  Plan: Colonoscopy. He was agreeable after discussion of procedure and risks.  CBC, CMP, hepatitis B surface antigen and surface antibody, hepatitis A total antibody, TPMT enzyme activity, ESR, fecal calprotectin  Shingles vaccine with primary care   30 minutes were spent on this encounter (including chart  review, history/exam, counseling/coordination of care, and documentation)  Nelida Meuse III

## 2020-01-11 ENCOUNTER — Other Ambulatory Visit: Payer: Self-pay | Admitting: Registered Nurse

## 2020-01-11 DIAGNOSIS — Z8639 Personal history of other endocrine, nutritional and metabolic disease: Secondary | ICD-10-CM

## 2020-01-11 NOTE — Progress Notes (Signed)
Thank you for this - I'll have him pop in for an A1c when he can  Kathrin Ruddy, NP

## 2020-01-17 LAB — QUANTIFERON-TB GOLD PLUS
Mitogen-NIL: 5.45 IU/mL
NIL: 0.03 IU/mL
QuantiFERON-TB Gold Plus: NEGATIVE
TB1-NIL: 0 IU/mL
TB2-NIL: 0.01 IU/mL

## 2020-01-17 LAB — THIOPURINE METHYLTRANSFERASE (TPMT), RBC: Thiopurine Methyltransferase, RBC: <3 nmol/hr/mL RBC — ABNORMAL LOW

## 2020-01-17 LAB — HEPATITIS B SURFACE ANTIGEN: Hepatitis B Surface Ag: NONREACTIVE

## 2020-01-17 LAB — HEPATITIS B SURFACE ANTIBODY,QUALITATIVE: Hep B S Ab: REACTIVE — AB

## 2020-01-17 LAB — HEPATITIS A ANTIBODY, TOTAL: Hepatitis A AB,Total: NONREACTIVE

## 2020-01-19 ENCOUNTER — Other Ambulatory Visit: Payer: Self-pay

## 2020-01-20 ENCOUNTER — Encounter: Payer: Self-pay | Admitting: Registered Nurse

## 2020-01-20 ENCOUNTER — Other Ambulatory Visit: Payer: Self-pay

## 2020-01-20 ENCOUNTER — Ambulatory Visit: Payer: BC Managed Care – PPO | Admitting: Registered Nurse

## 2020-01-20 VITALS — BP 112/74 | HR 68 | Temp 97.6°F | Resp 16 | Ht 74.0 in | Wt 226.0 lb

## 2020-01-20 DIAGNOSIS — R739 Hyperglycemia, unspecified: Secondary | ICD-10-CM | POA: Diagnosis not present

## 2020-01-20 DIAGNOSIS — Z8639 Personal history of other endocrine, nutritional and metabolic disease: Secondary | ICD-10-CM

## 2020-01-20 DIAGNOSIS — Z23 Encounter for immunization: Secondary | ICD-10-CM

## 2020-01-20 DIAGNOSIS — R7303 Prediabetes: Secondary | ICD-10-CM

## 2020-01-20 DIAGNOSIS — N529 Male erectile dysfunction, unspecified: Secondary | ICD-10-CM

## 2020-01-20 LAB — POCT GLYCOSYLATED HEMOGLOBIN (HGB A1C): Hemoglobin A1C: 5.7 % — AB (ref 4.0–5.6)

## 2020-01-20 MED ORDER — TADALAFIL 20 MG PO TABS: 10.0000 mg | ORAL_TABLET | Freq: Every day | ORAL | 0 refills | Status: DC | PRN

## 2020-01-20 NOTE — Progress Notes (Signed)
Established Patient Office Visit  Subjective:  Patient ID: Randy Wilson, male    DOB: January 20, 1966  Age: 54 y.o. MRN: [id number]  CC:  Chief Complaint  Patient presents with  . Hyperglycemia    per patient GI doctor checked labs and blood sugar was elevated and pcp to check     HPI [name] presents for A1c check  Was seen at GI provider Dr. Loletha Carrow for follow up on his Crohn's disease. With lab draw at that office had fasting glucose of 135 - Dr. Loletha Carrow has requested to have his A1c checked due to the elevation in fasting glucose.   Pt denies symptoms of t2dm or history of t2dm, no fam hx of t2dm to his knowledge.  Pt also notes ED. Trouble achieving and maintaining erection. Has been on tadalafil and sildenafil in past - preferred tadalafil. No AEs with that medication, achieves desired effect. No contraindications to his knowledge.  Past Medical History:  Diagnosis Date  . Allergy   . Asthma AS CHILD   NONE NOW  . Chronic enteritis   . Hemorrhoids   . Inflammatory polyps of colon ([place])   . Rectal bleeding NONE IN LAST 3 MONTHS  . Shoulder pain    RIGHT    Past Surgical History:  Procedure Laterality Date  . ANKLE SURGERY Left 2002   bone spurs removed  . COLONOSCOPY    . COLONSCOPY     X 4  . LAPAROSCOPIC ILEOCECECTOMY N/A 11/02/2015   Procedure: LAPAROSCOPIC ILEOCECECTOMY;  Surgeon: Leighton Ruff, MD;  Location: [facility];  Service: General;  Laterality: N/A;  . POLYPECTOMY    . SHOULDER SURGERY Left 2010   bone spurs removed     Family History  Problem Relation Age of Onset  . Asthma Mother 2       asthma attack   . Stroke Brother        Myocardial infarction  . Stroke Brother   . Asthma Sister   . Colon cancer Neg Hx   . Stomach cancer Neg Hx   . Rectal cancer Neg Hx   . Esophageal cancer Neg Hx   . Colon polyps Neg Hx   . Allergic rhinitis Neg Hx   . Angioedema Neg Hx   . Eczema Neg Hx   . Urticaria Neg Hx   . Liver cancer Neg Hx   . Pancreatic  cancer Neg Hx     Social History   Socioeconomic History  . Marital status: Married    Spouse name: Kristeen Miss   . Number of children: 3  . Years of education: Not on file  . Highest education level: High school graduate  Occupational History  . Occupation: Recieving Clerk    Comment: [employer]  Tobacco Use  . Smoking status: Former Smoker    Packs/day: 1.00    Years: 25.00    Pack years: 25.00    Types: Cigarettes    Quit date: 01/21/2006    Years since quitting: 14.0  . Smokeless tobacco: Never Used  Vaping Use  . Vaping Use: Never used  Substance and Sexual Activity  . Alcohol use: Yes    Alcohol/week: 6.0 standard drinks    Types: 6 Cans of beer per week    Comment: [facility]  . Drug use: No    Types: Marijuana  . Sexual activity: Yes    Comment: with monogamous wife, she takes OCP  Other Topics Concern  . Not on file  Social  History Narrative   Pt is from [place].    Social Determinants of Health   Financial Resource Strain:   . Difficulty of Paying Living Expenses: Not on file  Food Insecurity:   . Worried About Charity fundraiser in the Last Year: Not on file  . Ran Out of Food in the Last Year: Not on file  Transportation Needs:   . Lack of Transportation (Medical): Not on file  . Lack of Transportation (Non-Medical): Not on file  Physical Activity:   . Days of Exercise per Week: Not on file  . Minutes of Exercise per Session: Not on file  Stress:   . Feeling of Stress : Not on file  Social Connections:   . Frequency of Communication with Friends and Family: Not on file  . Frequency of Social Gatherings with Friends and Family: Not on file  . Attends Religious Services: Not on file  . Active Member of Clubs or Organizations: Not on file  . Attends Archivist Meetings: Not on file  . Marital Status: Not on file  Intimate Partner Violence:   . Fear of Current or Ex-Partner: Not on file  . Emotionally Abused: Not on file  . Physically Abused: Not  on file  . Sexually Abused: Not on file    Outpatient Medications Prior to Visit  Medication Sig Dispense Refill  . allopurinol (ZYLOPRIM) 100 MG tablet Take 1 tablet (100 mg total) by mouth daily. 90 tablet 3  . indomethacin (INDOCIN) 50 MG capsule Start 50 mg 3 times daily; initiate within 24 to 48 hours of flare onset preferably; discontinue 2 to 3 days after resolution of clinical signs; usual duration: 5 to 7 days 30 capsule 0  . cetirizine (ZYRTEC) 10 MG tablet Take 1 tablet (10 mg total) by mouth daily. (Patient not taking: Reported on 01/20/2020) 30 tablet 11  . PEG-KCl-NaCl-NaSulf-Na Asc-C (PLENVU) 140 g SOLR Take 140 g by mouth as directed. (Patient not taking: Reported on 01/20/2020) 1 each 0  . predniSONE (STERAPRED UNI-PAK 21 TAB) 10 MG (21) TBPK tablet Take per package instructions. Do not skip doses. Finish entire supply. (Patient not taking: Reported on 01/20/2020) 1 each 0   No facility-administered medications prior to visit.    Allergies  Allergen Reactions  . Tramadol Itching  . Shrimp [Shellfish Allergy] Hives    ROS Review of Systems  Constitutional: Negative.   HENT: Negative.   Eyes: Negative.   Respiratory: Negative.   Cardiovascular: Negative.   Gastrointestinal: Negative.   Endocrine: Negative.   Genitourinary: Negative.   Musculoskeletal: Negative.   Skin: Negative.   Allergic/Immunologic: Negative.   Neurological: Negative.   Hematological: Negative.   Psychiatric/Behavioral: Negative.   All other systems reviewed and are negative.     Objective:    Physical Exam Vitals and nursing note reviewed.  Constitutional:      General: He is not in acute distress.    Appearance: Normal appearance. He is normal weight. He is not ill-appearing, toxic-appearing or diaphoretic.  Cardiovascular:     Rate and Rhythm: Normal rate and regular rhythm.  Pulmonary:     Effort: Pulmonary effort is normal. No respiratory distress.  Skin:    General: Skin is  warm and dry.  Neurological:     General: No focal deficit present.     Mental Status: He is alert and oriented to person, place, and time. Mental status is at baseline.  Psychiatric:  Mood and Affect: Mood normal.        Behavior: Behavior normal.        Thought Content: Thought content normal.        Judgment: Judgment normal.     BP 112/74   Pulse 68   Temp 97.6 F (36.4 C) (Temporal)   Resp 16   Ht 6' 2"  (1.88 m)   Wt 226 lb (102.5 kg)   SpO2 95%   BMI 29.02 kg/m  Wt Readings from Last 3 Encounters:  01/20/20 226 lb (102.5 kg)  01/08/20 228 lb (103.4 kg)  01/06/20 227 lb (103 kg)     Health Maintenance Due  Topic Date Due  . INFLUENZA VACCINE  12/27/2019    There are no preventive care reminders to display for this patient.  Lab Results  Component Value Date   TSH 1.830 10/24/2017   Lab Results  Component Value Date   WBC 9.1 01/08/2020   HGB 15.0 01/08/2020   HCT 44.1 01/08/2020   MCV 86.3 01/08/2020   PLT 273.0 01/08/2020   Lab Results  Component Value Date   NA 137 01/08/2020   K 4.3 01/08/2020   CO2 25 01/08/2020   GLUCOSE 135 (H) 01/08/2020   BUN 21 01/08/2020   CREATININE 1.06 01/08/2020   BILITOT 0.5 01/08/2020   ALKPHOS 71 01/08/2020   AST 15 01/08/2020   ALT 18 01/08/2020   PROT 7.9 01/08/2020   ALBUMIN 4.8 01/08/2020   CALCIUM 9.8 01/08/2020   ANIONGAP 7 11/05/2015   GFR 87.28 01/08/2020   Lab Results  Component Value Date   CHOL 179 10/24/2017   Lab Results  Component Value Date   HDL 50 10/24/2017   Lab Results  Component Value Date   LDLCALC 115 (H) 10/24/2017   Lab Results  Component Value Date   TRIG 72 10/24/2017   Lab Results  Component Value Date   CHOLHDL 3.6 10/24/2017   Lab Results  Component Value Date   HGBA1C 5.5 05/02/2016      Assessment & Plan:   Problem List Items Addressed This Visit    None    Visit Diagnoses    History of elevated glucose    -  Primary   Relevant Orders   POCT  glycosylated hemoglobin (Hb A1C) (Completed)   Need for prophylactic vaccination and inoculation against influenza       Relevant Orders   Flu Vaccine QUAD 36+ mos IM (Completed)      No orders of the defined types were placed in this encounter.   Follow-up: No follow-ups on file.   PLAN  A1c at 5.7 - prediabetic range  Discussed lifestyle modifications for control with patient  Will give tadalafil 86m PO qd PRN  Return annually for A1c check, sooner with concerns  Patient encouraged to call clinic with any questions, comments, or concerns.  RMaximiano Coss NP

## 2020-01-20 NOTE — Patient Instructions (Signed)
° ° ° °  If you have lab work done today you will be contacted with your lab results within the next 2 weeks.  If you have not heard from us then please contact us. The fastest way to get your results is to register for My Chart. ° ° °IF you received an x-ray today, you will receive an invoice from [facility]. Please contact [facility] at [phone] with questions or concerns regarding your invoice.  ° °IF you received labwork today, you will receive an invoice from [facility]. Please contact [facility] at [phone] with questions or concerns regarding your invoice.  ° °Our billing staff will not be able to assist you with questions regarding bills from these companies. ° °You will be contacted with the lab results as soon as they are available. The fastest way to get your results is to activate your My Chart account. Instructions are located on the last page of this paperwork. If you have not heard from us regarding the results in 2 weeks, please contact this office. °  ° ° ° °

## 2020-01-21 ENCOUNTER — Telehealth: Payer: Self-pay | Admitting: Registered Nurse

## 2020-01-21 NOTE — Telephone Encounter (Signed)
Patient states [facility] is requiring a PA for tadalafil (CIALIS) 20 MG tablet , informed patient please allow 49 to 72 hour turn around time   [id number] [place] [place]  [phone] Fax:  [phone]

## 2020-01-21 NOTE — Telephone Encounter (Signed)
Can you do the PA on this medication for the patient

## 2020-01-22 NOTE — Telephone Encounter (Signed)
PA KEY FOR TADALAFIL 20 MG # 30 SENT TO PLAN , KEY [id number]

## 2020-01-28 NOTE — Telephone Encounter (Signed)
Pt is aware PA is in progress

## 2020-02-08 ENCOUNTER — Telehealth: Payer: Self-pay | Admitting: Registered Nurse

## 2020-02-08 NOTE — Telephone Encounter (Signed)
Pt is calling again about his PA about his CIALIS medication. PT would like a call regarding this. Please advise.

## 2020-02-09 ENCOUNTER — Other Ambulatory Visit: Payer: Self-pay | Admitting: Registered Nurse

## 2020-02-09 DIAGNOSIS — N529 Male erectile dysfunction, unspecified: Secondary | ICD-10-CM

## 2020-02-09 MED ORDER — VARDENAFIL HCL 10 MG PO TABS: 5.0000 mg | ORAL_TABLET | Freq: Every day | ORAL | 3 refills | Status: DC | PRN

## 2020-02-09 NOTE — Telephone Encounter (Signed)
Good morning Almyra Free have you by any chance done a PA on this medication for this patient?

## 2020-02-09 NOTE — Telephone Encounter (Signed)
Denice Paradise is going to call in an alternative for the patient that should be covered per insurance.

## 2020-02-09 NOTE — Telephone Encounter (Signed)
Pt needs PA for Cialis. Thank you

## 2020-02-09 NOTE — Telephone Encounter (Signed)
Pt called back to report that his insurance will still not cover his Rx. Pt says that his insurance needs his PCP to call this number below to resolve this.   [phone]

## 2020-02-11 ENCOUNTER — Other Ambulatory Visit: Payer: BC Managed Care – PPO

## 2020-02-11 ENCOUNTER — Ambulatory Visit (INDEPENDENT_AMBULATORY_CARE_PROVIDER_SITE_OTHER): Payer: BC Managed Care – PPO | Admitting: Gastroenterology

## 2020-02-11 DIAGNOSIS — Z23 Encounter for immunization: Secondary | ICD-10-CM | POA: Diagnosis not present

## 2020-02-11 NOTE — Telephone Encounter (Signed)
Pt is calling about the PA and messages stated that provider is sending a alternative medication that should be accepted by pts insurance. Pt stated that he left a message with someone that if provider called that number that the PA would be signed off. Pt is wanting a call about this, and would like to know when he can pick up the alternative medication. Please advise.

## 2020-02-11 NOTE — Telephone Encounter (Signed)
Patient is requesting a refill of the following medications: Requested Prescriptions   Pending Prescriptions Disp Refills   tadalafil (CIALIS) 10 MG tablet [Pharmacy Med Name: TADALAFIL 10 MG TABLET] 10 tablet 0    Date of patient request: 02/11/2020 Last office visit: 01/20/2020 Date of last refill: 01/20/2020 Last refill amount: 30 Follow up time period per chart: N/A

## 2020-02-15 ENCOUNTER — Encounter: Payer: Self-pay | Admitting: Gastroenterology

## 2020-02-25 ENCOUNTER — Telehealth: Payer: Self-pay

## 2020-02-25 ENCOUNTER — Ambulatory Visit (AMBULATORY_SURGERY_CENTER): Payer: BC Managed Care – PPO | Admitting: Gastroenterology

## 2020-02-25 ENCOUNTER — Other Ambulatory Visit: Payer: Self-pay

## 2020-02-25 ENCOUNTER — Encounter: Payer: Self-pay | Admitting: Gastroenterology

## 2020-02-25 VITALS — BP 111/65 | HR 52 | Temp 97.3°F | Resp 14 | Ht 74.0 in | Wt 226.0 lb

## 2020-02-25 DIAGNOSIS — K5000 Crohn's disease of small intestine without complications: Secondary | ICD-10-CM

## 2020-02-25 DIAGNOSIS — K5730 Diverticulosis of large intestine without perforation or abscess without bleeding: Secondary | ICD-10-CM

## 2020-02-25 DIAGNOSIS — K649 Unspecified hemorrhoids: Secondary | ICD-10-CM | POA: Diagnosis not present

## 2020-02-25 MED ORDER — HUMIRA-CD/UC/HS STARTER 40 MG/0.8ML ~~LOC~~ PNKT: PEN_INJECTOR | SUBCUTANEOUS | 0 refills | Status: DC

## 2020-02-25 MED ORDER — SODIUM CHLORIDE 0.9 % IV SOLN: 500.0000 mL | Freq: Once | INTRAVENOUS | Status: DC

## 2020-02-25 MED ORDER — HUMIRA (2 PEN) 40 MG/0.4ML ~~LOC~~ AJKT: 40.0000 mg | AUTO-INJECTOR | SUBCUTANEOUS | 6 refills | Status: DC

## 2020-02-25 NOTE — Telephone Encounter (Signed)
RX for Humira Induction dose and maintenance dose sent to [facility]. Spoke with patient and he is aware that they will be giving him a call within the next few days to get shipment set up. Pt is scheduled for a follow up on 04/11/20 at 1:20 pm with Dr. Loletha Carrow. Advised patient to give [place] a call if he had any concerns. Pt verbalized understanding

## 2020-02-25 NOTE — Progress Notes (Signed)
C.W. vital signs. 

## 2020-02-25 NOTE — Progress Notes (Signed)
To PACU, VSS. Report to Rn.tb 

## 2020-02-25 NOTE — Telephone Encounter (Signed)
-----   Message from [place], MD sent at 02/25/2020  1:26 PM EDT ----- This patient re-established care for Crohn's disease and had his colonoscopy today. I believe we have already gone through the approval process for Humira. Please help him get started on Humira as soon as possible with usual induction dosing. Then he needs a clinic visit with me in mid to late November.  - HD

## 2020-02-25 NOTE — Patient Instructions (Signed)

## 2020-02-25 NOTE — Progress Notes (Signed)
Pt's states no medical or surgical changes since previsit or office visit. 

## 2020-02-25 NOTE — Op Note (Signed)
[facility] Patient Name: Randy Wilson Procedure Date: 02/25/2020 10:36 AM MRN: [id number] Endoscopist: Mallie Mussel L. Danis , MD Age: 54 Referring MD:  Date of Birth: [birth year] Gender: Male Account #: [id number] Procedure:                Colonoscopy Indications:              Disease activity assessment of Crohn's disease of                            the small bowel (giant inflammatory ICV polyp                            resected 2017, colonoscopy in 2018 and 2019 with                            progressive ulcerative changes. Recommendation to                            begin Humira in 2019. Patient recently                            re-established care) Medicines:                Monitored Anesthesia Care Procedure:                After obtaining informed consent, the colonoscope                            was passed under direct vision. Throughout the                            procedure, the patient's blood pressure, pulse, and                            oxygen saturations were monitored continuously. The                            Colonoscope was introduced through the anus and                            advanced to the the ileocolonic anastomosis. The                            colonoscopy was performed without difficulty. The                            patient tolerated the procedure well. The quality                            of the bowel preparation was excellent. The rectum                            and Neo-terminal ileum and ileo-colonic anastomosis  were photographed. The bowel preparation used was                            Plenvu. Scope In: 11:02:09 AM Scope Out: 11:14:22 AM Scope Withdrawal Time: 0 hours 8 minutes 55 seconds  Total Procedure Duration: 0 hours 12 minutes 13 seconds  Findings:                 The perianal and digital rectal examinations were                            normal. Specifically, no peri-anal Crohn's activity                             seen.                           There was evidence of a prior side-to-side                            ileo-colonic anastomosis in the proximal ascending                            colon. This was patent and was characterized by                            extenive ulceration and polypoid inflammation. As                            before, the neo-TI could not be deeply intubated                            due to scope looping and anastomotic anatomy.                            (similar Crohn's activity to 2019 exam)                           Diverticula were found in the left colon.                           Internal hemorrhoids were found.                           The exam was otherwise without abnormality on                            direct and retroflexion views. Complications:            No immediate complications. Estimated Blood Loss:     Estimated blood loss: none. Impression:               - Patent side-to-side ileo-colonic anastomosis,                            characterized by ulceration.                           -  Diverticulosis in the left colon.                           - Internal hemorrhoids.                           - The examination was otherwise normal on direct                            and retroflexion views.                           - No specimens collected. Recommendation:           - Patient has a contact number available for                            emergencies. The signs and symptoms of potential                            delayed complications were discussed with the                            patient. Return to normal activities tomorrow.                            Written discharge instructions were provided to the                            patient.                           - Resume previous diet.                           - Continue present medications.                           - No recommendation at this time regarding  repeat                            colonoscopy.                           - Begin Humira induction dosing.                           (azathioprine may be added later - patient has a                            low TPMT level) Aynsley Fleet L. Loletha Carrow, MD 02/25/2020 11:26:00 AM This report has been signed electronically.

## 2020-02-26 ENCOUNTER — Ambulatory Visit: Payer: BC Managed Care – PPO

## 2020-02-29 ENCOUNTER — Telehealth: Payer: Self-pay | Admitting: *Deleted

## 2020-02-29 ENCOUNTER — Telehealth: Payer: Self-pay

## 2020-02-29 NOTE — Telephone Encounter (Signed)
  Follow up Call-  Call back number 02/25/2020 12/25/2017  Post procedure Call Back phone  # [phone]  Permission to leave phone message Yes Yes  Some recent data might be hidden     Patient questions:  Do you have a fever, pain , or abdominal swelling? No. Pain Score  0 *  Have you tolerated food without any problems? Yes.    Have you been able to return to your normal activities? Yes.    Do you have any questions about your discharge instructions: Diet   No. Medications  No. Follow up visit  No.  Do you have questions or concerns about your Care? No.  Actions: * If pain score is 4 or above: No action needed, pain <4.  1. Have you developed a fever since your procedure? no  2.   Have you had an respiratory symptoms (SOB or cough) since your procedure? no  3.   Have you tested positive for COVID 19 since your procedure no  4.   Have you had any family members/close contacts diagnosed with the COVID 19 since your procedure?  no   If yes to any of these questions please route to Joylene John, RN and Joella Prince, RN

## 2020-02-29 NOTE — Telephone Encounter (Signed)
LVM

## 2020-03-17 NOTE — Telephone Encounter (Signed)
Received denial from [facility] for Humira starter pack and maintenance dose.   Placed papers in your IN box for review when you return to the office. Thanks

## 2020-03-17 NOTE — Telephone Encounter (Signed)
I am working in the hospital this week and then will be out of the office for a couple of days, so I will not be back in the office for almost a week.  What is the reason for the denial, because it makes absolutely no sense given his diagnosis of Crohn's disease.?

## 2020-03-18 NOTE — Telephone Encounter (Signed)
Denial due to no documented previous treatment and failure/no response to other treatment.  Patient was previously prescribed Prednisone but this is documented on the medication list for gout and was prescribed by another provider.

## 2020-03-18 NOTE — Telephone Encounter (Signed)
This is outrageous.  I will need a peer-to-peer physician review set up about this after I return to the office late next week. I cannot do it this week because I am tied up on the inpatient consult service.

## 2020-03-18 NOTE — Telephone Encounter (Signed)
Reached out to [place] 3 times to set up a Provider Courtesy Review (peer to peer) unable to speak with live representative. Entered NPI for Dr. Loletha Carrow and patient's information and kept receiving the same menu options.   Called [facility] appeals and they provided me with another number and suggested that I just fax the information.   Records and appeal form faxed to [facility] at [phone]. Will await further response.

## 2020-03-21 NOTE — Telephone Encounter (Signed)
Dr. Loletha Carrow, just an FYI. Patient has been approved for Humira.   Authorization # [id number]  Approval dates: March 21, 2020 - March 21, 2021

## 2020-03-21 NOTE — Telephone Encounter (Signed)
Received call from [facility], notifying me that they were able to get Humira approved for patient. She will be faxing over approval letter today.

## 2020-03-23 NOTE — Telephone Encounter (Signed)
Excellent news- thanks for working on that.  He also needs to receive Hepatitis A vaccine.  Previously got [facility]Rix and labs from August show he developed immunity to Hep B but not Hep A

## 2020-03-23 NOTE — Telephone Encounter (Signed)
Patient has a follow up on 04/11/20, have added the need of Hep A vaccine to appt note.

## 2020-03-25 ENCOUNTER — Encounter: Payer: Self-pay | Admitting: Registered Nurse

## 2020-03-25 ENCOUNTER — Other Ambulatory Visit: Payer: Self-pay

## 2020-03-25 ENCOUNTER — Ambulatory Visit: Payer: BC Managed Care – PPO | Admitting: Registered Nurse

## 2020-03-25 VITALS — BP 145/74 | HR 81 | Temp 98.6°F | Resp 17 | Ht 74.0 in | Wt 231.0 lb

## 2020-03-25 DIAGNOSIS — K643 Fourth degree hemorrhoids: Secondary | ICD-10-CM

## 2020-03-25 MED ORDER — LIDOCAINE 5 % EX OINT: 1.0000 "application " | TOPICAL_OINTMENT | CUTANEOUS | 0 refills | Status: DC | PRN

## 2020-03-25 NOTE — Patient Instructions (Signed)
° ° ° °  If you have lab work done today you will be contacted with your lab results within the next 2 weeks.  If you have not heard from us then please contact us. The fastest way to get your results is to register for My Chart. ° ° °IF you received an x-ray today, you will receive an invoice from [facility]. Please contact [facility] at [phone] with questions or concerns regarding your invoice.  ° °IF you received labwork today, you will receive an invoice from [facility]. Please contact [facility] at [phone] with questions or concerns regarding your invoice.  ° °Our billing staff will not be able to assist you with questions regarding bills from these companies. ° °You will be contacted with the lab results as soon as they are available. The fastest way to get your results is to activate your My Chart account. Instructions are located on the last page of this paperwork. If you have not heard from us regarding the results in 2 weeks, please contact this office. °  ° ° ° °

## 2020-03-29 ENCOUNTER — Other Ambulatory Visit: Payer: Self-pay

## 2020-03-29 ENCOUNTER — Telehealth: Payer: Self-pay | Admitting: Registered Nurse

## 2020-03-29 DIAGNOSIS — K643 Fourth degree hemorrhoids: Secondary | ICD-10-CM

## 2020-03-29 NOTE — Telephone Encounter (Signed)
Your referral has been placed

## 2020-03-29 NOTE — Telephone Encounter (Signed)
Pt called in about a referral for having hemorrhoids removed. Pt was seen about this on Friday 03/25/20. Did not see referral in pts chart. Please advise.

## 2020-03-31 ENCOUNTER — Other Ambulatory Visit: Payer: Self-pay

## 2020-03-31 ENCOUNTER — Ambulatory Visit (INDEPENDENT_AMBULATORY_CARE_PROVIDER_SITE_OTHER): Payer: BC Managed Care – PPO | Admitting: Surgery

## 2020-03-31 ENCOUNTER — Encounter: Payer: Self-pay | Admitting: Surgery

## 2020-03-31 VITALS — BP 142/91 | HR 72 | Temp 98.3°F | Resp 12 | Ht 74.0 in | Wt 231.0 lb

## 2020-03-31 DIAGNOSIS — K645 Perianal venous thrombosis: Secondary | ICD-10-CM | POA: Diagnosis not present

## 2020-03-31 DIAGNOSIS — K644 Residual hemorrhoidal skin tags: Secondary | ICD-10-CM | POA: Diagnosis not present

## 2020-03-31 NOTE — Patient Instructions (Addendum)
Please be sure to increase your fiber intake. Aim for 30 grams of fiber daily. Be sure to increase your fluids. You will need to keep your urine clear. You may try Miralax. Use this twice daily. This can be purchased at [employer] and drug store.    Psyllium    Chewable bars or wafers What is this medicine? PSYLLIUM (SIL i yum) is a bulk-forming fiber laxative. This medicine is used to treat constipation. Increasing fiber in the diet may also help lower cholesterol and promote heart health for some people. This medicine may be used for other purposes; ask your health care provider or pharmacist if you have questions. COMMON BRAND NAME(S): Metamucil What should I tell my health care provider before I take this medicine? They need to know if you have any of these conditions:  blockage in your bowel  difficulty swallowing  inflammatory bowel disease  stomach or intestine problems  sudden change in bowel habits lasting more than 2 weeks  an unusual or allergic reaction to psyllium, other medicines, dyes, or preservatives  pregnant or trying or get pregnant  breast-feeding How should I use this medicine? Take this medicine by mouth with a full glass of water. Take this medicine by mouth. Chew it completely before swallowing. Follow the directions on the package labeling, or take as directed by your health care professional. Take your medicine at regular intervals. Do not take your medicine more often than directed. Talk to your pediatrician regarding the use of this medicine in children. While this drug may be prescribed for children as young as 7 years of age for selected conditions, precautions do apply. Overdosage: If you think you have taken too much of this medicine contact a poison control center or emergency room at once. NOTE: This medicine is only for you. Do not share this medicine with others. What if I miss a dose? If you miss a dose, take it as soon as you can. If it is  almost time for your next dose, take only that dose. Do not take double or extra doses. What may interact with this medicine? Interactions are not expected. Take this product at least 2 hours before or after other medicines. This list may not describe all possible interactions. Give your health care provider a list of all the medicines, herbs, non-prescription drugs, or dietary supplements you use. Also tell them if you smoke, drink alcohol, or use illegal drugs. Some items may interact with your medicine. What should I watch for while using this medicine? Check with your doctor or health care professional if your symptoms do not start to get better or if they get worse. Stop using this medicine and contact your doctor or health care professional if you have rectal bleeding or if you have to treat your constipation for more than 1 week. These could be signs of a more serious condition. Drink several glasses of water a day while you are taking this medicine. This will help to relieve constipation and prevent dehydration. What side effects may I notice from receiving this medicine? Side effects that you should report to your doctor or health care professional as soon as possible:  allergic reactions like skin rash, itching or hives, swelling of the face, lips, or tongue  breathing problems  chest pain  nausea, vomiting  rectal bleeding  trouble swallowing Side effects that usually do not require medical attention (report to your doctor or health care professional if they continue or are bothersome):  bloating  gas  stomach cramps This list may not describe all possible side effects. Call your doctor for medical advice about side effects. You may report side effects to FDA at 1-800-FDA-1088. Where should I keep my medicine? Keep out of the reach of children. Store at room temperature between 15 and 30 degrees C (59 and 86 degrees F). Protect from moisture. Throw away any unused medicine  after the expiration date. NOTE: This sheet is a summary. It may not cover all possible information. If you have questions about this medicine, talk to your doctor, pharmacist, or health care provider.  2020 Elsevier/Gold Standard (2017-10-08 14:56:45)   Hemorrhoids Hemorrhoids are swollen veins that may develop:  In the butt (rectum). These are called internal hemorrhoids.  Around the opening of the butt (anus). These are called external hemorrhoids. Hemorrhoids can cause pain, itching, or bleeding. Most of the time, they do not cause serious problems. They usually get better with diet changes, lifestyle changes, and other home treatments. What are the causes? This condition may be caused by:  Having trouble pooping (constipation).  Pushing hard (straining) to poop.  Watery poop (diarrhea).  Pregnancy.  Being very overweight (obese).  Sitting for long periods of time.  Heavy lifting or other activity that causes you to strain.  Anal sex.  Riding a bike for a long period of time. What are the signs or symptoms? Symptoms of this condition include:  Pain.  Itching or soreness in the butt.  Bleeding from the butt.  Leaking poop.  Swelling in the area.  One or more lumps around the opening of your butt. How is this diagnosed? A doctor can often diagnose this condition by looking at the affected area. The doctor may also:  Do an exam that involves feeling the area with a gloved hand (digital rectal exam).  Examine the area inside your butt using a small tube (anoscope).  Order blood tests. This may be done if you have lost a lot of blood.  Have you get a test that involves looking inside the colon using a flexible tube with a camera on the end (sigmoidoscopy or colonoscopy). How is this treated? This condition can usually be treated at home. Your doctor may tell you to change what you eat, make lifestyle changes, or try home treatments. If these do not help,  procedures can be done to remove the hemorrhoids or make them smaller. These may involve:  Placing rubber bands at the base of the hemorrhoids to cut off their blood supply.  Injecting medicine into the hemorrhoids to shrink them.  Shining a type of light energy onto the hemorrhoids to cause them to fall off.  Doing surgery to remove the hemorrhoids or cut off their blood supply. Follow these instructions at home: Eating and drinking   Eat foods that have a lot of fiber in them. These include whole grains, beans, nuts, fruits, and vegetables.  Ask your doctor about taking products that have added fiber (fibersupplements).  Reduce the amount of fat in your diet. You can do this by: ? Eating low-fat dairy products. ? Eating less red meat. ? Avoiding processed foods.  Drink enough fluid to keep your pee (urine) pale yellow. Managing pain and swelling   Take a warm-water bath (sitz bath) for 20 minutes to ease pain. Do this 3-4 times a day. You may do this in a bathtub or using a portable sitz bath that fits over the toilet.  If told, put ice on the  painful area. It may be helpful to use ice between your warm baths. ? Put ice in a plastic bag. ? Place a towel between your skin and the bag. ? Leave the ice on for 20 minutes, 2-3 times a day. General instructions  Take over-the-counter and prescription medicines only as told by your doctor. ? Medicated creams and medicines may be used as told.  Exercise often. Ask your doctor how much and what kind of exercise is best for you.  Go to the bathroom when you have the urge to poop. Do not wait.  Avoid pushing too hard when you poop.  Keep your butt dry and clean. Use wet toilet paper or moist towelettes after pooping.  Do not sit on the toilet for a long time.  Keep all follow-up visits as told by your doctor. This is important. Contact a doctor if you:  Have pain and swelling that do not get better with treatment or  medicine.  Have trouble pooping.  Cannot poop.  Have pain or swelling outside the area of the hemorrhoids. Get help right away if you have:  Bleeding that will not stop. Summary  Hemorrhoids are swollen veins in the butt or around the opening of the butt.  They can cause pain, itching, or bleeding.  Eat foods that have a lot of fiber in them. These include whole grains, beans, nuts, fruits, and vegetables.  Take a warm-water bath (sitz bath) for 20 minutes to ease pain. Do this 3-4 times a day. This information is not intended to replace advice given to you by your health care provider. Make sure you discuss any questions you have with your health care provider. Document Revised: 05/22/2018 Document Reviewed: 10/03/2017 Elsevier Patient Education  [place]. [employer] Content in Foods  See the following list for the dietary fiber content of some common foods. High-fiber foods High-fiber foods contain 4 grams or more (4g or more) of fiber per serving. They include:  Artichoke (fresh) -- 1 medium has 10.3g of fiber.  Baked beans, plain or vegetarian (canned) --  cup has 5.2g of fiber.  Blackberries or raspberries (fresh) --  cup has 4g of fiber.  Bran cereal --  cup has 8.6g of fiber.  Bulgur (cooked) --  cup has 4g of fiber.  Kidney beans (canned) --  cup has 6.8g of fiber.  Lentils (cooked) --  cup has 7.8g of fiber.  Pear (fresh) -- 1 medium has 5.1g of fiber.  Peas (frozen) --  cup has 4.4g of fiber.  Pinto beans (canned) --  cup has 5.5g of fiber.  Pinto beans (dried and cooked) --  cup has 7.7g of fiber.  Potato with skin (baked) -- 1 medium has 4.4g of fiber.  Quinoa (cooked) --  cup has 5g of fiber.  Soybeans (canned, frozen, or fresh) --  cup has 5.1g of fiber. Moderate-fiber foods Moderate-fiber foods contain 1-4 grams (1-4g) of fiber per serving. They include:  Almonds -- 1 oz. has 3.5g of fiber.  Apple with skin -- 1 medium has  3.3g of fiber.  Applesauce, sweetened --  cup has 1.5g of fiber.  Bagel, plain -- one 4-inch (10-cm) bagel has 2g of fiber.  Banana -- 1 medium has 3.1g of fiber.  Broccoli (cooked) --  cup has 2.5g of fiber.  Carrots (cooked) --  cup has 2.3g of fiber.  Corn (canned or frozen) --  cup has 2.1g of fiber.  Corn tortilla -- one 6-inch (15-cm) tortilla  has 1.5g of fiber.  Green beans (canned) --  cup has 2g of fiber.  Instant oatmeal --  cup has about 2g of fiber.  Long-grain brown rice (cooked) -- 1 cup has 3.5g of fiber.  Macaroni, enriched (cooked) -- 1 cup has 2.5g of fiber.  Melon -- 1 cup has 1.4g of fiber.  Multigrain cereal --  cup has about 2-4g of fiber.  Orange -- 1 small has 3.1g of fiber.  Potatoes, mashed --  cup has 1.6g of fiber.  Raisins -- 1/4 cup has 1.6g of fiber.  Squash --  cup has 2.9g of fiber.  Sunflower seeds --  cup has 1.1g of fiber.  Tomato -- 1 medium has 1.5g of fiber.  Vegetable or soy patty -- 1 has 3.4g of fiber.  Whole-wheat bread -- 1 slice has 2g of fiber.  Whole-wheat spaghetti --  cup has 3.2g of fiber. Low-fiber foods Low-fiber foods contain less than 1 gram (less than 1g) of fiber per serving. They include:  Egg -- 1 large.  Flour tortilla -- one 6-inch (15-cm) tortilla.  Fruit juice --  cup.  Lettuce -- 1 cup.  Meat, poultry, or fish -- 1 oz.  Milk -- 1 cup.  Spinach (raw) -- 1 cup.  White bread -- 1 slice.  White rice --  cup.  Yogurt --  cup. Actual amounts of fiber in foods may be different depending on processing. Talk with your dietitian about how much fiber you need in your diet. This information is not intended to replace advice given to you by your health care provider. Make sure you discuss any questions you have with your health care provider. Document Revised: 01/05/2016 Document Reviewed: 07/07/2015 Elsevier Patient Education  [place]. Polyethylene Glycol powder What is  this medicine? POLYETHYLENE GLYCOL 3350 (pol ee ETH i leen; GLYE col) powder is a laxative used to treat constipation. It increases the amount of water in the stool. Bowel movements become easier and more frequent. This medicine may be used for other purposes; ask your health care provider or pharmacist if you have questions. COMMON BRAND NAME(S): Sharlyn Bologna, GlycoLax, Healthylax, MiraLax, Smooth LAX, [facility] [facility] What should I tell my health care provider before I take this medicine? They need to know if you have any of these conditions:  a history of blockage of the stomach or intestine  current abdomen distension or pain  difficulty swallowing  diverticulitis, ulcerative colitis, or other chronic bowel disease  phenylketonuria  an unusual or allergic reaction to polyethylene glycol, other medicines, dyes, or preservatives  pregnant or trying to get pregnant  breast-feeding How should I use this medicine? Take this medicine by mouth. The bottle has a measuring cap that is marked with a line. Pour the powder into the cap up to the marked line (the dose is about 1 heaping tablespoon). Add the powder in the cap to a full glass (4 to 8 ounces or 120 to 240 mL) of water, juice, soda, coffee or tea. Mix the powder well. Ensure that the powder is fully dissolved. Do not drink if there are any clumps. Drink the solution. Take exactly as directed. Do not take your medicine more often than directed. Talk to your pediatrician regarding the use of this medicine in children. Special care may be needed. Overdosage: If you think you have taken too much of this medicine contact a poison control center or emergency room at once. NOTE: This medicine is only for you. Do  not share this medicine with others. What if I miss a dose? If you miss a dose, take it as soon as you can. If it is almost time for your next dose, take only that dose. Do not take double or extra doses. What may interact with this  medicine? Interactions are not expected. This list may not describe all possible interactions. Give your health care provider a list of all the medicines, herbs, non-prescription drugs, or dietary supplements you use. Also tell them if you smoke, drink alcohol, or use illegal drugs. Some items may interact with your medicine. What should I watch for while using this medicine? Do not use for more than 2 weeks without advice from your doctor or health care professional. It can take 2 to 4 days to have a bowel movement and to experience improvement in constipation. See your health care professional for any changes in bowel habits, including constipation, that are severe or last longer than three weeks. Always take this medicine with plenty of water. What side effects may I notice from receiving this medicine? Side effects that you should report to your doctor or health care professional as soon as possible:  diarrhea  difficulty breathing  itching of the skin, hives, or skin rash  severe bloating, pain, or distension of the stomach  vomiting Side effects that usually do not require medical attention (report to your doctor or health care professional if they continue or are bothersome):  bloating or gas  lower abdominal discomfort or cramps  nausea This list may not describe all possible side effects. Call your doctor for medical advice about side effects. You may report side effects to FDA at 1-800-FDA-1088. Where should I keep my medicine? Keep out of the reach of children. Store between 15 and 30 degrees C (59 and 86 degrees F). Throw away any unused medicine after the expiration date. NOTE: This sheet is a summary. It may not cover all possible information. If you have questions about this medicine, talk to your doctor, pharmacist, or health care provider.  2020 Elsevier/Gold Standard (2017-10-31 10:42:01)

## 2020-03-31 NOTE — Progress Notes (Signed)
Patient ID: Randy Wilson, male   DOB: 1966-05-27, 54 y.o.   MRN: [id number]  Chief Complaint: Hemorrhoids  History of Present Illness Randy Wilson is a 54 y.o. male with what he describes to be hemorrhoids that wax and wane.  He had worse pain about 3 weeks ago, reports a history of doing a lot of heavy lifting.  He reports that he has actually tried to adjust his diet to diminish the frequency of bowel movements.  Admits that he does have to strain with that 1 movement per day that he has.  He has utilize Colace in the past, but is never utilized any fiber supplementation or other laxatives.  He is used topical agents without significant help including sitz bath's. Did report seeing a small amount of blood on tissue paper a week ago.  Complains of burning itching pain.  Prior topical agents include lidocaine cream and Preparation H.  He underwent a colonoscopy about 1 month ago for follow-up and was negative. His hemorrhoid history began in the early 33s.  Past Medical History Past Medical History:  Diagnosis Date  . Allergy   . Asthma AS CHILD   NONE NOW  . Chronic enteritis   . Hemorrhoids   . Inflammatory polyps of colon ([place])   . Rectal bleeding NONE IN LAST 3 MONTHS  . Shoulder pain    RIGHT      Past Surgical History:  Procedure Laterality Date  . ANKLE SURGERY Left 2002   bone spurs removed  . COLONOSCOPY    . COLONSCOPY     X 4  . LAPAROSCOPIC ILEOCECECTOMY N/A 11/02/2015   Procedure: LAPAROSCOPIC ILEOCECECTOMY;  Surgeon: Leighton Ruff, MD;  Location: [facility];  Service: General;  Laterality: N/A;  . POLYPECTOMY    . SHOULDER SURGERY Left 2010   bone spurs removed     Allergies  Allergen Reactions  . Tramadol Itching  . Other   . Shrimp [Shellfish Allergy] Hives    Current Outpatient Medications  Medication Sig Dispense Refill  . Adalimumab (HUMIRA PEN) 40 MG/0.4ML PNKT Inject 40 mg into the skin every 14 (fourteen) days. (Patient not taking: Reported on 03/25/2020) 2  each 6  . Adalimumab (HUMIRA PEN-CD/UC/HS STARTER) 40 MG/0.8ML PNKT Inject 160 mg into the skin on Day 1. Inject 80 mg into the skin on Day 15. (Patient not taking: Reported on 03/25/2020) 6 each 0  . allopurinol (ZYLOPRIM) 100 MG tablet Take 1 tablet (100 mg total) by mouth daily. 90 tablet 3  . cetirizine (ZYRTEC) 10 MG tablet Take 1 tablet (10 mg total) by mouth daily. (Patient not taking: Reported on 03/25/2020) 30 tablet 11  . lidocaine (XYLOCAINE) 5 % ointment Apply 1 application topically as needed. 35.44 g 0   No current facility-administered medications for this visit.    Family History Family History  Problem Relation Age of Onset  . Asthma Mother 25       asthma attack   . Stroke Brother        Myocardial infarction  . Stroke Brother   . Asthma Sister   . Colon cancer Neg Hx   . Stomach cancer Neg Hx   . Rectal cancer Neg Hx   . Esophageal cancer Neg Hx   . Colon polyps Neg Hx   . Allergic rhinitis Neg Hx   . Angioedema Neg Hx   . Eczema Neg Hx   . Urticaria Neg Hx   . Liver cancer Neg Hx   .  Pancreatic cancer Neg Hx       Social History Social History   Tobacco Use  . Smoking status: Former Smoker    Packs/day: 1.00    Years: 25.00    Pack years: 25.00    Types: Cigarettes    Quit date: 01/21/2006    Years since quitting: 14.2  . Smokeless tobacco: Never Used  Vaping Use  . Vaping Use: Never used  Substance Use Topics  . Alcohol use: Yes    Alcohol/week: 6.0 standard drinks    Types: 6 Cans of beer per week    Comment: OCC  . Drug use: No    Types: Marijuana        Review of Systems  Constitutional: Negative.   HENT: Positive for hearing loss.   Eyes: Negative.   Respiratory: Negative.   Cardiovascular: Negative.   Gastrointestinal: Positive for constipation.  Genitourinary: Negative.   Musculoskeletal: Negative.   Skin: Negative.   Neurological: Negative.   Psychiatric/Behavioral: Negative.       Physical Exam Blood pressure (!)  142/91, pulse 72, temperature 98.3 F (36.8 C), temperature source Oral, resp. rate 12, height 6' 2"  (1.88 m), weight 231 lb (104.8 kg). Last Weight  Most recent update: 03/31/2020  1:35 PM   Weight  104.8 kg (231 lb)            CONSTITUTIONAL: Well developed, and nourished, appropriately responsive and aware without distress.   EYES: Sclera non-icteric.   EARS, NOSE, MOUTH AND THROAT: Mask worn.   Hearing is intact to voice.  NECK: Trachea is midline, and there is no jugular venous distension.  LYMPH NODES:  Lymph nodes in the neck are not enlarged. RESPIRATORY:  Lungs are clear, and breath sounds are equal bilaterally. Normal respiratory effort without pathologic use of accessory muscles. CARDIOVASCULAR: Heart is regular in rate and rhythm. GI: The abdomen is soft, nontender, and nondistended. There were no palpable masses. I did not appreciate hepatosplenomegaly. There were normal bowel sounds. GU: DRE: Soft uninflamed external hemorrhoidal skin tags present.  No sentinel tag or evidence of fissure, or fistula present.  No surrounding perianal skin irritation or excoriation noted, no induration or perianal tenderness noted.  Surprising degree of sphincteric spasm on digital exam, palpable internal thrombosed hemorrhoid at 5:00.  No remarkable redundancy, though there is a diffuse diminishment in the internal lumen present significant, and likely where his degree of discomfort occurs. MUSCULOSKELETAL:  Symmetrical muscle tone appreciated in all four extremities.    SKIN: Skin turgor is normal. No pathologic skin lesions appreciated.  NEUROLOGIC:  Motor and sensation appear grossly normal.  Cranial nerves are grossly without defect. PSYCH:  Alert and oriented to person, place and time. Affect is appropriate for situation.  Data Reviewed I have personally reviewed what is currently available of the patient's imaging, recent labs and medical records.   Labs:  CBC Latest Ref Rng & Units  01/08/2020 10/24/2017 09/13/2016  WBC 4.0 - 10.5 K/uL 9.1 5.9 5.9  Hemoglobin 13.0 - 17.0 g/dL 15.0 14.4 13.9(A)  Hematocrit 39 - 52 % 44.1 42.6 41.5(A)  Platelets 150 - 400 K/uL 273.0 266 -   CMP Latest Ref Rng & Units 01/08/2020 12/07/2019 10/24/2017  Glucose 70 - 99 mg/dL 135(H) 95 88  BUN 6 - 23 mg/dL 21 13 13   Creatinine 0.40 - 1.50 mg/dL 1.06 0.94 1.13  Sodium 135 - 145 mEq/L 137 140 140  Potassium 3.5 - 5.1 mEq/L 4.3 4.5 4.3  Chloride 96 -  112 mEq/L 103 104 104  CO2 19 - 32 mEq/L 25 22 19(L)  Calcium 8.4 - 10.5 mg/dL 9.8 9.1 9.4  Total Protein 6.0 - 8.3 g/dL 7.9 7.0 7.5  Total Bilirubin 0.2 - 1.2 mg/dL 0.5 0.4 0.6  Alkaline Phos 39 - 117 U/L 71 84 72  AST 0 - 37 U/L 15 11 20   ALT 0 - 53 U/L 18 13 21       Imaging:  Within last 24 hrs: No results found.  Assessment    Circumferential internal hemorrhoids without remarkable piles or redundancy, clearly not prolapsing. Soft external hemorrhoidal skin tags without active inflammation or thrombosis. A singular small internal hemorrhoid that may be thrombosed, but based on his history this happened about 2 weeks ago. Patient Active Problem List   Diagnosis Date Noted  . Prediabetes 01/20/2020  . History of elevated glucose 01/20/2020  . Erectile dysfunction 01/20/2020  . Encounter for orthopedic follow-up care 11/25/2018  . Pain in joint of right shoulder 10/16/2018  . Stiffness of right shoulder joint 10/16/2018  . Complete rotator cuff tear or rupture of unspecified shoulder, not specified as traumatic 10/03/2018  . Traumatic complete tear of rotator cuff 09/19/2018  . 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 Asthma 04/24/2015  . Hx of colonic polyp 11/08/2014  . History of colonic polyps 09/17/2014    Plan    Advised to pursue a goal of 25 to 30 g of fiber daily.  Made aware that the majority of this may be through natural sources, but advised to be aware of actual consumption and to ensure minimal consumption by  daily supplementation.  Various forms of supplements discussed.  Strongly advised to consume more fluids to ensure adequate hydration, instructed to watch color of urine to determine adequacy of hydration.  Clarity is pursued in urine output, and bowel activity that correlates to significant meal intake.  Patient is to avoid deferring having bowel movements, advised to take the time at the first sign of sensation, typically following meals and in the morning.  Subsequent utilization of MiraLAX to ensure at least daily movement, ideally twice daily bowel movements.  If multiple doses of MiraLAX are necessary utilize them.  Offered follow-up in 2 months to evaluate his progress and determine if there is anything else we can do to help him.  Face-to-face time spent with the patient and accompanying care providers(if present) was 30 minutes, with more than 50% of the time spent counseling, educating, and coordinating care of the patient.      Ronny Bacon M.D., FACS 03/31/2020, 2:12 PM

## 2020-04-04 ENCOUNTER — Encounter: Payer: Self-pay | Admitting: Registered Nurse

## 2020-04-04 NOTE — Progress Notes (Signed)
Acute Office Visit  Subjective:    Patient ID: Randy Wilson, male    DOB: [birth year], 54 y.o.   MRN: [id number]  Chief Complaint  Patient presents with  . Hemorrhoids    Patient states he has more problems with hemmorrhoids. Per patient he has beemn sitting in a warm bath 3 times a day with some relief.    HPI Patient is in today for ongoing hemorrhoids Has been doing warm soaks 3 times daily Some relief Has used hydrocortisone in the past. Some relief Brbpr. Hoping to take more interventional steps  Past Medical History:  Diagnosis Date  . Allergy   . Asthma AS CHILD   NONE NOW  . Chronic enteritis   . Hemorrhoids   . Inflammatory polyps of colon ([place])   . Rectal bleeding NONE IN LAST 3 MONTHS  . Shoulder pain    RIGHT    Past Surgical History:  Procedure Laterality Date  . ANKLE SURGERY Left 2002   bone spurs removed  . COLONOSCOPY    . COLONSCOPY     X 4  . LAPAROSCOPIC ILEOCECECTOMY N/A 11/02/2015   Procedure: LAPAROSCOPIC ILEOCECECTOMY;  Surgeon: Leighton Ruff, MD;  Location: [facility];  Service: General;  Laterality: N/A;  . POLYPECTOMY    . SHOULDER SURGERY Left 2010   bone spurs removed     Family History  Problem Relation Age of Onset  . Asthma Mother 37       asthma attack   . Stroke Brother        Myocardial infarction  . Stroke Brother   . Asthma Sister   . Colon cancer Neg Hx   . Stomach cancer Neg Hx   . Rectal cancer Neg Hx   . Esophageal cancer Neg Hx   . Colon polyps Neg Hx   . Allergic rhinitis Neg Hx   . Angioedema Neg Hx   . Eczema Neg Hx   . Urticaria Neg Hx   . Liver cancer Neg Hx   . Pancreatic cancer Neg Hx     Social History   Socioeconomic History  . Marital status: Married    Spouse name: Kristeen Miss   . Number of children: 3  . Years of education: Not on file  . Highest education level: High school graduate  Occupational History  . Occupation: Recieving Clerk    Comment: [employer]  Tobacco Use  . Smoking status: Former  Smoker    Packs/day: 1.00    Years: 25.00    Pack years: 25.00    Types: Cigarettes    Quit date: 01/21/2006    Years since quitting: 14.2  . Smokeless tobacco: Never Used  Vaping Use  . Vaping Use: Never used  Substance and Sexual Activity  . Alcohol use: Yes    Alcohol/week: 6.0 standard drinks    Types: 6 Cans of beer per week    Comment: OCC  . Drug use: No    Types: Marijuana  . Sexual activity: Yes    Comment: with monogamous wife, she takes OCP  Other Topics Concern  . Not on file  Social History Narrative   Pt is from [place].    Social Determinants of Health   Financial Resource Strain:   . Difficulty of Paying Living Expenses: Not on file  Food Insecurity:   . Worried About Charity fundraiser in the Last Year: Not on file  . Ran Out of Food in the Last Year: Not on file  Transportation Needs:   . Film/video editor (Medical): Not on file  . Lack of Transportation (Non-Medical): Not on file  Physical Activity:   . Days of Exercise per Week: Not on file  . Minutes of Exercise per Session: Not on file  Stress:   . Feeling of Stress : Not on file  Social Connections:   . Frequency of Communication with Friends and Family: Not on file  . Frequency of Social Gatherings with Friends and Family: Not on file  . Attends Religious Services: Not on file  . Active Member of Clubs or Organizations: Not on file  . Attends Archivist Meetings: Not on file  . Marital Status: Not on file  Intimate Partner Violence:   . Fear of Current or Ex-Partner: Not on file  . Emotionally Abused: Not on file  . Physically Abused: Not on file  . Sexually Abused: Not on file    Outpatient Medications Prior to Visit  Medication Sig Dispense Refill  . allopurinol (ZYLOPRIM) 100 MG tablet Take 1 tablet (100 mg total) by mouth daily. 90 tablet 3  . Adalimumab (HUMIRA PEN) 40 MG/0.4ML PNKT Inject 40 mg into the skin every 14 (fourteen) days. (Patient not taking: Reported  on 03/25/2020) 2 each 6  . Adalimumab (HUMIRA PEN-CD/UC/HS STARTER) 40 MG/0.8ML PNKT Inject 160 mg into the skin on Day 1. Inject 80 mg into the skin on Day 15. (Patient not taking: Reported on 03/25/2020) 6 each 0  . cetirizine (ZYRTEC) 10 MG tablet Take 1 tablet (10 mg total) by mouth daily. (Patient not taking: Reported on 03/25/2020) 30 tablet 11  . indomethacin (INDOCIN) 50 MG capsule Start 50 mg 3 times daily; initiate within 24 to 48 hours of flare onset preferably; discontinue 2 to 3 days after resolution of clinical signs; usual duration: 5 to 7 days (Patient not taking: Reported on 03/25/2020) 30 capsule 0  . predniSONE (STERAPRED UNI-PAK 21 TAB) 10 MG (21) TBPK tablet Take per package instructions. Do not skip doses. Finish entire supply. (Patient not taking: Reported on 01/20/2020) 1 each 0  . tadalafil (CIALIS) 10 MG tablet Take 0.5-1 tablets (5-10 mg total) by mouth daily as needed for erectile dysfunction. (Patient not taking: Reported on 03/25/2020) 10 tablet 5  . tadalafil (CIALIS) 20 MG tablet Take 0.5-1 tablets (10-20 mg total) by mouth daily as needed for erectile dysfunction. (Patient not taking: Reported on 03/25/2020) 30 tablet 0   No facility-administered medications prior to visit.    Allergies  Allergen Reactions  . Tramadol Itching  . Other   . Shrimp [Shellfish Allergy] Hives    Review of Systems  Constitutional: Negative.   HENT: Negative.   Eyes: Negative.   Respiratory: Negative.   Cardiovascular: Negative.   Gastrointestinal: Negative.   Genitourinary: Negative.   Musculoskeletal: Negative.   Skin: Negative.   Neurological: Negative.   Psychiatric/Behavioral: Negative.        Objective:    Physical Exam Vitals and nursing note reviewed.  Constitutional:      Appearance: Normal appearance.  Cardiovascular:     Rate and Rhythm: Normal rate and regular rhythm.  Pulmonary:     Effort: Pulmonary effort is normal. No respiratory distress.    Genitourinary:   Skin:    General: Skin is warm and dry.  Neurological:     General: No focal deficit present.     Mental Status: He is alert and oriented to person, place, and time. Mental status is at  baseline.  Psychiatric:        Mood and Affect: Mood normal.        Behavior: Behavior normal.        Thought Content: Thought content normal.        Judgment: Judgment normal.     BP (!) 145/74   Pulse 81   Temp 98.6 F (37 C) (Temporal)   Resp 17   Ht 6' 2"  (1.88 m)   Wt 231 lb (104.8 kg)   SpO2 95%   BMI 29.66 kg/m  Wt Readings from Last 3 Encounters:  03/31/20 231 lb (104.8 kg)  03/25/20 231 lb (104.8 kg)  02/25/20 226 lb (102.5 kg)    There are no preventive care reminders to display for this patient.  There are no preventive care reminders to display for this patient.   Lab Results  Component Value Date   TSH 1.830 10/24/2017   Lab Results  Component Value Date   WBC 9.1 01/08/2020   HGB 15.0 01/08/2020   HCT 44.1 01/08/2020   MCV 86.3 01/08/2020   PLT 273.0 01/08/2020   Lab Results  Component Value Date   NA 137 01/08/2020   K 4.3 01/08/2020   CO2 25 01/08/2020   GLUCOSE 135 (H) 01/08/2020   BUN 21 01/08/2020   CREATININE 1.06 01/08/2020   BILITOT 0.5 01/08/2020   ALKPHOS 71 01/08/2020   AST 15 01/08/2020   ALT 18 01/08/2020   PROT 7.9 01/08/2020   ALBUMIN 4.8 01/08/2020   CALCIUM 9.8 01/08/2020   ANIONGAP 7 11/05/2015   GFR 87.28 01/08/2020   Lab Results  Component Value Date   CHOL 179 10/24/2017   Lab Results  Component Value Date   HDL 50 10/24/2017   Lab Results  Component Value Date   LDLCALC 115 (H) 10/24/2017   Lab Results  Component Value Date   TRIG 72 10/24/2017   Lab Results  Component Value Date   CHOLHDL 3.6 10/24/2017   Lab Results  Component Value Date   HGBA1C 5.7 (A) 01/20/2020       Assessment & Plan:   Problem List Items Addressed This Visit    None    Visit Diagnoses    Grade IV hemorrhoids     -  Primary   Relevant Medications   lidocaine (XYLOCAINE) 5 % ointment       Meds ordered this encounter  Medications  . lidocaine (XYLOCAINE) 5 % ointment    Sig: Apply 1 application topically as needed.    Dispense:  35.44 g    Refill:  0    Order Specific Question:   Supervising Provider    Answer:   Carlota Raspberry, JEFFREY R [[id number]   PLAN  Xylocaine for pain relief   Continue hydrocortisone  Refer to gen surg. Pt may be good candidate for banding or excision  Patient encouraged to call clinic with any questions, comments, or concerns.   Maximiano Coss, NP

## 2020-04-11 ENCOUNTER — Ambulatory Visit: Payer: BC Managed Care – PPO | Admitting: Gastroenterology

## 2020-04-28 ENCOUNTER — Ambulatory Visit: Payer: BC Managed Care – PPO

## 2020-05-03 ENCOUNTER — Ambulatory Visit (INDEPENDENT_AMBULATORY_CARE_PROVIDER_SITE_OTHER): Payer: BC Managed Care – PPO | Admitting: Registered Nurse

## 2020-05-03 ENCOUNTER — Other Ambulatory Visit: Payer: Self-pay

## 2020-05-03 DIAGNOSIS — Z23 Encounter for immunization: Secondary | ICD-10-CM

## 2020-05-30 ENCOUNTER — Other Ambulatory Visit: Payer: Self-pay

## 2020-05-30 DIAGNOSIS — Z8616 Personal history of COVID-19: Secondary | ICD-10-CM

## 2020-05-30 DIAGNOSIS — Z20822 Contact with and (suspected) exposure to covid-19: Secondary | ICD-10-CM

## 2020-05-30 HISTORY — DX: Personal history of COVID-19: Z86.16

## 2020-05-31 LAB — SARS-COV-2, NAA 2 DAY TAT

## 2020-05-31 LAB — NOVEL CORONAVIRUS, NAA: SARS-CoV-2, NAA: DETECTED — AB

## 2020-07-15 ENCOUNTER — Other Ambulatory Visit: Payer: Self-pay

## 2020-07-15 ENCOUNTER — Ambulatory Visit (INDEPENDENT_AMBULATORY_CARE_PROVIDER_SITE_OTHER): Payer: BC Managed Care – PPO | Admitting: Registered Nurse

## 2020-07-15 ENCOUNTER — Encounter: Payer: Self-pay | Admitting: Registered Nurse

## 2020-07-15 VITALS — BP 128/80 | HR 61 | Temp 98.0°F | Resp 18 | Ht 74.0 in | Wt 236.2 lb

## 2020-07-15 DIAGNOSIS — N529 Male erectile dysfunction, unspecified: Secondary | ICD-10-CM

## 2020-07-15 DIAGNOSIS — Z789 Other specified health status: Secondary | ICD-10-CM

## 2020-07-15 DIAGNOSIS — F1090 Alcohol use, unspecified, uncomplicated: Secondary | ICD-10-CM

## 2020-07-15 MED ORDER — TADALAFIL 20 MG PO TABS: 10.0000 mg | ORAL_TABLET | Freq: Every day | ORAL | 0 refills | Status: DC | PRN

## 2020-07-15 NOTE — Patient Instructions (Signed)
° ° ° °  If you have lab work done today you will be contacted with your lab results within the next 2 weeks.  If you have not heard from us then please contact us. The fastest way to get your results is to register for My Chart. ° ° °IF you received an x-ray today, you will receive an invoice from [facility]. Please contact [facility] at [phone] with questions or concerns regarding your invoice.  ° °IF you received labwork today, you will receive an invoice from [facility]. Please contact [facility] at [phone] with questions or concerns regarding your invoice.  ° °Our billing staff will not be able to assist you with questions regarding bills from these companies. ° °You will be contacted with the lab results as soon as they are available. The fastest way to get your results is to activate your My Chart account. Instructions are located on the last page of this paperwork. If you have not heard from us regarding the results in 2 weeks, please contact this office. °  ° ° ° °

## 2020-07-16 LAB — HEPATIC FUNCTION PANEL
ALT: 30 IU/L (ref 0–44)
AST: 24 IU/L (ref 0–40)
Albumin: 4.6 g/dL (ref 3.8–4.9)
Alkaline Phosphatase: 78 IU/L (ref 44–121)
Bilirubin Total: 0.5 mg/dL (ref 0.0–1.2)
Bilirubin, Direct: 0.15 mg/dL (ref 0.00–0.40)
Total Protein: 7.5 g/dL (ref 6.0–8.5)

## 2020-07-16 LAB — LIPASE: Lipase: 24 U/L (ref 13–78)

## 2020-07-16 LAB — HEMOGLOBIN A1C
Est. average glucose Bld gHb Est-mCnc: 108 mg/dL
Hgb A1c MFr Bld: 5.4 % (ref 4.8–5.6)

## 2020-07-16 LAB — AMYLASE: Amylase: 38 U/L (ref 31–110)

## 2020-07-18 ENCOUNTER — Telehealth: Payer: Self-pay

## 2020-07-18 NOTE — Telephone Encounter (Signed)
Spoke with patient, he has been scheduled for his 2nd hep A injection on Thursday, 08/11/20 at 11 AM. Patient has been reminded of his follow up on 07/19/20 at 1:20 PM.   Patient verbalized understanding and had no concerns at the end of the call.

## 2020-07-18 NOTE — Telephone Encounter (Signed)
-----   Message from Denali sent at 02/11/2020 11:42 AM EDT ----- Due for 2nd hep A.  First given 02/11/2020

## 2020-07-19 ENCOUNTER — Ambulatory Visit: Payer: BC Managed Care – PPO | Admitting: Gastroenterology

## 2020-07-19 ENCOUNTER — Encounter: Payer: Self-pay | Admitting: Gastroenterology

## 2020-07-19 VITALS — BP 122/78 | HR 65 | Ht 74.0 in | Wt 237.0 lb

## 2020-07-19 DIAGNOSIS — Z23 Encounter for immunization: Secondary | ICD-10-CM

## 2020-07-19 DIAGNOSIS — K648 Other hemorrhoids: Secondary | ICD-10-CM | POA: Diagnosis not present

## 2020-07-19 DIAGNOSIS — K5000 Crohn's disease of small intestine without complications: Secondary | ICD-10-CM

## 2020-07-19 NOTE — Progress Notes (Signed)
[facility] Progress Note  Chief Complaint: Ileal Crohn's disease  Subjective  History:  Dakotah was last seen for a colonoscopy on September 30, at which time active Crohn's was found at his ileocolonic anastomosis as well as internal hemorrhoids.  The plan was to begin Humira soon afterwards, and he had some pretreatment lab testing done. We did not hear back from him for some time, and then it appears he was seen by primary care a few months back for symptomatic hemorrhoids and referred to general surgery.  Surgical note from November 4 recommended some medical management of MiraLAX to avoid constipation, no procedures done.  Altair received his first hepatitis A vaccine September 16 and second injection scheduled for March 17 was immune to hepatitis B on testing).  He has also since received his first shingles vaccine with primary care. There was a no-show with our office since I last saw him.  Hasaan tells me that he has some intermittent crampy abdominal pain and loose stool.  No rectal bleeding.  He did not start his Humira because he believed he needed to finish all of his vaccinations before doing so. Regarding his hemorrhoids, there have been 2 episodes of severe prolapse without rectal bleeding, the last about 2 months ago.  He has had other episodes of less severe prolapse that he still finds bothersome.  ROS: Cardiovascular:  no chest pain Respiratory: no dyspnea  The patient's Past Medical, Family and Social History were reviewed and are on file in the EMR.  Objective:  Med list reviewed  Current Outpatient Medications:  .  allopurinol (ZYLOPRIM) 100 MG tablet, Take 1 tablet (100 mg total) by mouth daily., Disp: 90 tablet, Rfl: 3 .  cetirizine (ZYRTEC) 10 MG tablet, Take 1 tablet (10 mg total) by mouth daily., Disp: 30 tablet, Rfl: 11 .  tadalafil (CIALIS) 20 MG tablet, Take 0.5 tablets (10 mg total) by mouth daily as needed for erectile dysfunction., Disp: 30  tablet, Rfl: 0 .  Adalimumab (HUMIRA PEN) 40 MG/0.4ML PNKT, Inject 40 mg into the skin every 14 (fourteen) days. (Patient not taking: No sig reported), Disp: 2 each, Rfl: 6 .  Adalimumab (HUMIRA PEN-CD/UC/HS STARTER) 40 MG/0.8ML PNKT, Inject 160 mg into the skin on Day 1. Inject 80 mg into the skin on Day 15. (Patient not taking: No sig reported), Disp: 6 each, Rfl: 0   Vital signs in last 24 hrs: Vitals:   07/19/20 1329  BP: 122/78  Pulse: 65   Wt Readings from Last 3 Encounters:  07/19/20 237 lb (107.5 kg)  07/15/20 236 lb 3.2 oz (107.1 kg)  03/31/20 231 lb (104.8 kg)    Physical Exam  He is well-appearing  HEENT: sclera anicteric, oral mucosa moist without lesions  Neck: supple, no thyromegaly, JVD or lymphadenopathy  Cardiac: RRR without murmurs, S1S2 heard, no peripheral edema  Pulm: clear to auscultation bilaterally, normal RR and effort noted  Abdomen: soft, no tenderness, with active bowel sounds. No guarding or palpable hepatosplenomegaly.  Skin; warm and dry, no jaundice or rash  Labs:  TPMT level is low  ___________________________________________ Radiologic studies:   ____________________________________________ Other:   _____________________________________________ Assessment & Plan  Assessment: Encounter Diagnoses  Name Primary?  . Crohn's disease of small intestine without complication ([place]) Yes  . Need for prophylactic vaccination and inoculation against viral hepatitis   . Prolapsed internal hemorrhoids     Small bowel Crohn's disease recurrent after previous surgery.  He needs anti-TNF therapy  and I encouraged him to start that soon.  He has begun his hepatitis a and shingles vaccination series, and that is sufficient to start the Humira.  He certainly should complete those vaccine series as scheduled, and he intends to do so.  We reviewed the mechanism of Humira activity and the risks and benefits including immunosuppression, small increased  risk of lymphoma and other malignancy.  He was anxious to start therapy soon.  Regarding the hemorrhoids, he did not feel that any intervention was necessary, but I described hemorrhoidal banding and gave him a brochure.  Prolapsing hemorrhoids causing pain but not necessarily bleeding still are amenable to banding and I encouraged him to contact me if he feels that becomes necessary.   30 minutes were spent on this encounter (including chart review, history/exam, counseling/coordination of care, and documentation) > 50% of that time was spent on counseling and coordination of care.  Topics discussed included: Crohn's and its therapy, hemorrhoidal management.  Nelida Meuse III

## 2020-07-19 NOTE — Patient Instructions (Addendum)
If you are age 57 or older, your body mass index should be between 23-30. Your Body mass index is 30.43 kg/m. If this is out of the aforementioned range listed, please consider follow up with your Primary Care Provider.  If you are age 7 or younger, your body mass index should be between 19-25. Your Body mass index is 30.43 kg/m. If this is out of the aformentioned range listed, please consider follow up with your Primary Care Provider.    We will see you in 2 months for a follow up appointment on Monday, 09-19-20 at 1:40 pm.    It was a pleasure to see you today!  Dr. Loletha Carrow

## 2020-07-20 ENCOUNTER — Other Ambulatory Visit: Payer: Self-pay

## 2020-07-20 ENCOUNTER — Encounter: Payer: Self-pay | Admitting: Registered Nurse

## 2020-07-20 ENCOUNTER — Telehealth: Payer: Self-pay

## 2020-07-20 DIAGNOSIS — K5000 Crohn's disease of small intestine without complications: Secondary | ICD-10-CM

## 2020-07-20 MED ORDER — HUMIRA (2 PEN) 40 MG/0.4ML ~~LOC~~ AJKT: 40.0000 mg | AUTO-INJECTOR | SUBCUTANEOUS | 6 refills | Status: DC

## 2020-07-20 NOTE — Telephone Encounter (Signed)
-----   Message from Doran Stabler, MD sent at 07/19/2020  9:22 PM EST ----- This patient saw me today for Crohn's disease.  He was supposed to start Humira therapy several months ago but elected not to do so.  He now is ready to begin Humira, but I do not know if his current prescription is still active.  Please communicate with his specialty pharmacy to make sure there is an active prescription, and so they can contact the patient for teaching and to arrange delivery.  - HD

## 2020-07-20 NOTE — Telephone Encounter (Signed)
Spoke with Faith at [facility], she states that they have the prescription for the Humira starter pack at Humira 80 mg/0.8 ml dispense #3. She states that she does not have the prescription for the maintenance dose and we will need to refax. Spoke with Demetria to set up shipment for patient, she states that Humira starter pack will be delivered to patient's home around 07/27/20. Patient has been enrolled in [place] program, completed form faxed to [phone]. Maintenance dose prescription faxed to [facility] at [phone].   Patient is aware of the above information. Advised that if he does not hear from a nurse ambassador within 1 business day to call [phone] to set up training. Patient verbalized understanding and had no concerns at the end of the call.

## 2020-07-26 NOTE — Telephone Encounter (Signed)
Lm on vm for patient to return call, wanted to follow up and make sure he was able to speak with nurse ambassador prior to receiving Humira prescription.

## 2020-08-01 NOTE — Telephone Encounter (Signed)
Spoke with patient, he states that he has received the prescription for Humira but has not heard from the nurse ambassador. I provided patient with the phone number [phone] to call and speak with an ambassador. Patient verbalized understanding and had no concerns at the end of the call.

## 2020-08-18 ENCOUNTER — Telehealth: Payer: Self-pay | Admitting: Gastroenterology

## 2020-08-18 NOTE — Telephone Encounter (Signed)
Spoke with patient, he states that today would be his second dose of his starter kit but the Humira pen malfunctioned. Patient states that he reached out to his nurse navigator but he has not heard back from her. Patient also states that he also spoke with the pharmacy and was told to call us and see if we had a sample. Advised that unfortunately we do not have any samples on hand and it sometimes takes [place] several weeks to get the shipment in. Advised patient that he reach back out to the pharmacy to see if they can overnight him another pen since it was a pen malfunction. Pt states that he will reach back out the pharmacy. Advised that if he is not able to get it within the next day or so he will need to let us know because he may have to restart the Humira induction dose. Patient verbalized understanding and had no concerns at the end of the call.

## 2020-08-18 NOTE — Telephone Encounter (Signed)
Pt states that his Humira Pen malfunctioned right when he was going to administer his third dose. He is asking if by any chance we have a sample to give him. Pls call him.

## 2020-08-19 NOTE — Telephone Encounter (Signed)
Calling to let nurse know that a form will be sent to Dr Loletha Carrow. If the office can please complete as soon as possible and they will send him another dose of the Humira pen.

## 2020-08-22 NOTE — Telephone Encounter (Signed)
Patient calling to follow up on status of the previous message has appointment for 08/23/20.

## 2020-08-23 NOTE — Telephone Encounter (Signed)
Left detailed message letting him know that we have faxed the form back to the pharmacy and they should be in contact with him soon to set up shipment. Advised that if he does not hear from them to give the pharmacy a call. Advised patient to give me a call back if he had any further questions.

## 2020-09-19 ENCOUNTER — Ambulatory Visit: Payer: BC Managed Care – PPO | Admitting: Gastroenterology

## 2020-09-20 ENCOUNTER — Ambulatory Visit: Payer: BC Managed Care – PPO

## 2020-10-26 ENCOUNTER — Ambulatory Visit: Payer: BC Managed Care – PPO | Admitting: Gastroenterology

## 2020-10-31 ENCOUNTER — Other Ambulatory Visit: Payer: Self-pay

## 2020-10-31 ENCOUNTER — Encounter: Payer: Self-pay | Admitting: Medical

## 2020-10-31 ENCOUNTER — Ambulatory Visit: Payer: BC Managed Care – PPO | Admitting: Medical

## 2020-10-31 VITALS — BP 140/84 | HR 80 | Ht 74.0 in | Wt 241.6 lb

## 2020-10-31 DIAGNOSIS — M25562 Pain in left knee: Secondary | ICD-10-CM | POA: Diagnosis not present

## 2020-10-31 DIAGNOSIS — M25462 Effusion, left knee: Secondary | ICD-10-CM

## 2020-10-31 DIAGNOSIS — G8929 Other chronic pain: Secondary | ICD-10-CM | POA: Diagnosis not present

## 2020-10-31 MED ORDER — NAPROXEN 500 MG PO TABS: 500.0000 mg | ORAL_TABLET | Freq: Two times a day (BID) | ORAL | 0 refills | Status: DC

## 2020-10-31 NOTE — Patient Instructions (Addendum)
Left knee swelling and pain   Begin Naprosyn 540m with food twice daily for 5-7 days initially  Use this instead of BC powders or over the counter anti=inflammatory  This is a short term treatment to reduce swelling and inflammation  continue Allopurinol for gout prevention  Consider knee sleeve over the counter or ACE wrap for compressoin    [facility].   Their hours are 8am - 4:30 pm Monday - Friday.  Take your insurance card with you.  [facility] [phone] [address] [place] [id number] [address]AF[address] City  [id number]
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
## 2020-11-02 ENCOUNTER — Ambulatory Visit
Admission: RE | Admit: 2020-11-02 | Discharge: 2020-11-02 | Disposition: A | Discharge status: Home / Self Care | Payer: BC Managed Care – PPO | Source: Ambulatory Visit | Attending: Medical | Admitting: Medical

## 2020-11-02 DIAGNOSIS — G8929 Other chronic pain: Secondary | ICD-10-CM

## 2020-11-02 DIAGNOSIS — M25562 Pain in left knee: Secondary | ICD-10-CM

## 2020-11-02 DIAGNOSIS — M25462 Effusion, left knee: Secondary | ICD-10-CM

## 2020-11-04 ENCOUNTER — Telehealth: Payer: Self-pay

## 2020-11-04 ENCOUNTER — Other Ambulatory Visit: Payer: Self-pay

## 2020-11-04 DIAGNOSIS — M25562 Pain in left knee: Secondary | ICD-10-CM

## 2020-11-04 DIAGNOSIS — G8929 Other chronic pain: Secondary | ICD-10-CM

## 2020-11-04 DIAGNOSIS — M25462 Effusion, left knee: Secondary | ICD-10-CM

## 2020-11-04 NOTE — Telephone Encounter (Signed)
[facility] has called to inform that patient x-ray results are in the system. Please review.

## 2020-11-17 ENCOUNTER — Other Ambulatory Visit: Payer: Self-pay

## 2020-11-17 ENCOUNTER — Ambulatory Visit: Payer: BC Managed Care – PPO | Admitting: Orthopedic Surgery

## 2020-11-17 DIAGNOSIS — M1712 Unilateral primary osteoarthritis, left knee: Secondary | ICD-10-CM | POA: Diagnosis not present

## 2020-11-19 ENCOUNTER — Encounter: Payer: Self-pay | Admitting: Orthopedic Surgery

## 2020-11-19 MED ORDER — METHYLPREDNISOLONE ACETATE 40 MG/ML IJ SUSP
40.0000 mg | INTRAMUSCULAR | Status: AC | PRN
  Administered 2020-11-17: 40 mg via INTRA_ARTICULAR

## 2020-11-19 MED ORDER — BUPIVACAINE HCL 0.25 % IJ SOLN
4.0000 mL | INTRAMUSCULAR | Status: AC | PRN
  Administered 2020-11-17: 4 mL via INTRA_ARTICULAR

## 2020-11-19 MED ORDER — LIDOCAINE HCL 1 % IJ SOLN
5.0000 mL | INTRAMUSCULAR | Status: AC | PRN
  Administered 2020-11-17: 5 mL

## 2020-11-19 NOTE — Progress Notes (Signed)
Office Visit Note   Patient: Randy Wilson           Date of Birth: [birth year]           MRN: [id number] Visit Date: 11/17/2020 Requested by: Carlena Hurl, PA-C [address] [place],  [place] [address] PCP: Carlena Hurl, PA-C  Subjective: Chief Complaint  Patient presents with   Left Knee - Pain   Right Knee - Pain    HPI: Randy Wilson is a 55 y.o. male who presents to the office complaining of left knee pain for 2 months.  Patient complains of diffuse pain throughout the left knee with worse pain in the medial aspect of the knee.  Denies any history of injury but does note acute onset about 2 months ago.  He wakes with pain on occasion and has constant daily pain that bothers him especially when he is working.  He works at [employer] helping to [employer].  Denies any instability episodes but does note the leg feels like it wants to give out on him.  He has difficulty ascending stairs or an incline due to the knee pain.  No history of diabetes or smoking.  He has taken Aleve and naproxen without much relief.  No history of prior knee surgery.  Denies any groin pain or radicular pain..                ROS: All systems reviewed are negative as they relate to the chief complaint within the history of present illness.  Patient denies fevers or chills.  Assessment & Plan: Visit Diagnoses:  1. Primary osteoarthritis of left knee     Plan: Patient is a 55 year old male who presents complaint of left knee pain.  He has had left knee pain over the last 2 months without any improvement.  He notes acute onset but no injury.  No history of gout or any previous injury.  Radiographs of the left knee were reviewed which were nonweightbearing radiographs but did show degenerative changes of the medial compartment with osteophyte formation.  Anticipate patient would have some joint space narrowing if he had weightbearing radiographs.  Given his medial joint line tenderness and corresponding  radiographic findings, impression is early osteoarthritis of the left knee.  Discussed options available patient.  He would like to try an injection today.  20 cc of inflammatory synovial fluid was aspirated.  Cortisone injection administered and patient tolerated the procedure well.  Follow-up in 6 weeks for clinical recheck with Dr. Marlou Sa.  His job involves a lot of walking so he was given a work note to remain out of work today and tomorrow and allow the injection to take effect for couple days before he returns to work on Monday.  Follow-Up Instructions: No follow-ups on file.   Orders:  No orders of the defined types were placed in this encounter.  No orders of the defined types were placed in this encounter.     Procedures: Large Joint Inj: L knee on 11/17/2020 11:52 AM Indications: diagnostic evaluation, joint swelling and pain Details: 18 G 1.5 in needle, superolateral approach  Arthrogram: No  Medications: 5 mL lidocaine 1 %; 40 mg methylPREDNISolone acetate 40 MG/ML; 4 mL bupivacaine 0.25 % Outcome: tolerated well, no immediate complications Procedure, treatment alternatives, risks and benefits explained, specific risks discussed. Consent was given by the patient. Immediately prior to procedure a time out was called to verify the correct patient, procedure, equipment, support staff and  site/side marked as required. Patient was prepped and draped in the usual sterile fashion.      Clinical Data: No additional findings.  Objective: Vital Signs: There were no vitals taken for this visit.  Physical Exam:  Constitutional: Patient appears well-developed HEENT:  Head: Normocephalic Eyes:EOM are normal Neck: Normal range of motion Cardiovascular: Normal rate Pulmonary/chest: Effort normal Neurologic: Patient is alert Skin: Skin is warm Psychiatric: Patient has normal mood and affect  Ortho Exam: Ortho exam demonstrates left knee with positive effusion.  Tenderness over the  medial joint line.  Not as much tenderness over the lateral joint line.  Able to perform straight leg raise.  0 degrees of knee extension with 120 degrees of knee flexion.  No calf tenderness.  Negative Homans' sign.  No pain with hip range of motion.  No instability to varus/valgus stress or anterior/posterior drawer.  Specialty Comments:  No specialty comments available.  Imaging: No results found.   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   Asthma 04/24/2015   Hx of colonic polyp 11/08/2014   History of colonic polyps 09/17/2014   Past Medical History:  Diagnosis Date   Allergy    Asthma AS CHILD   NONE NOW   Chronic enteritis    Hemorrhoids    Inflammatory polyps of colon (HCC)    Rectal bleeding NONE IN LAST 3 MONTHS   Shoulder pain    RIGHT    Family History  Problem Relation Age of Onset   Asthma Mother 66       asthma attack    Stroke Brother        Myocardial infarction   Stroke Brother    Asthma Sister    Colon cancer Neg Hx    Stomach cancer Neg Hx    Rectal cancer Neg Hx    Esophageal cancer Neg Hx    Colon polyps Neg Hx    Allergic rhinitis Neg Hx    Angioedema Neg Hx    Eczema Neg Hx    Urticaria Neg Hx    Liver cancer Neg Hx    Pancreatic cancer Neg Hx     Past Surgical History:  Procedure Laterality Date   ANKLE SURGERY Left 2002   bone spurs removed   COLONOSCOPY     COLONSCOPY     X 4    LAPAROSCOPIC ILEOCECECTOMY N/A 11/02/2015   Procedure: LAPAROSCOPIC ILEOCECECTOMY;  Surgeon: Leighton Ruff, MD;  Location: [facility];  Service: General;  Laterality: N/A;   POLYPECTOMY     SHOULDER SURGERY Left 2010   bone spurs removed    Social History   Occupational History   Occupation: Recieving Clerk    Comment: [employer]  Tobacco Use   Smoking status: Former    Packs/day: 1.00    Years: 25.00    Pack years: 25.00    Types: Cigarettes    Quit date: 01/21/2006    Years since quitting: 14.8   Smokeless  tobacco: Never  Vaping Use   Vaping Use: Never used  Substance and Sexual Activity   Alcohol use: Yes    Alcohol/week: 6.0 standard drinks    Types: 6 Cans of beer per week    Comment: [facility]   Drug use: No    Types: Marijuana   Sexual activity: Yes    Comment: with monogamous wife, she takes OCP

## 2020-12-01 ENCOUNTER — Ambulatory Visit: Payer: BC Managed Care – PPO | Admitting: Medical

## 2020-12-21 ENCOUNTER — Ambulatory Visit: Payer: BC Managed Care – PPO | Admitting: Physician Assistant

## 2020-12-27 ENCOUNTER — Telehealth: Payer: Self-pay

## 2020-12-27 NOTE — Telephone Encounter (Signed)
Spoke with patient, he has been scheduled for a follow up with Dr. Loletha Carrow on Tuesday, 01/03/21 at 1:20 PM. Patient states that he has been taking Humira 40 mg every 14 days. He is due for a dose today. He has [facility]. Patient had no concerns at the end of the call.

## 2020-12-27 NOTE — Telephone Encounter (Signed)
-----   Message from Alfredia Ferguson, PA-C sent at 12/21/2020  2:30 PM EDT ----- Regarding: Pt no show Just FYI-this is a patient of yours with active anastomotic Crohn's who you were starting on Humira.  It looks as if this was supposed to have been initiated around March but I cannot tell whether the patient has actually started it or not. May need to try to contact him and find out whether he has started the Humira and then arrange for another follow-up visit

## 2020-12-29 ENCOUNTER — Other Ambulatory Visit: Payer: Self-pay

## 2020-12-29 ENCOUNTER — Encounter: Payer: Self-pay | Admitting: Orthopedic Surgery

## 2020-12-29 ENCOUNTER — Ambulatory Visit: Payer: BC Managed Care – PPO | Admitting: Orthopedic Surgery

## 2020-12-29 DIAGNOSIS — S83242D Other tear of medial meniscus, current injury, left knee, subsequent encounter: Secondary | ICD-10-CM

## 2020-12-29 NOTE — Addendum Note (Signed)
Addended by: Laurann Montana on: 12/29/2020 11:12 AM   Modules accepted: Orders

## 2020-12-29 NOTE — Progress Notes (Signed)
Office Visit Note   Patient: Randy Wilson           Date of Birth: June 05, 1965           MRN: [id number] Visit Date: 12/29/2020 Requested by: Carlena Hurl, PA-C [address] [place],  [place] [address] PCP: Carlena Hurl, PA-C  Subjective: Chief Complaint  Patient presents with   Left Knee - Follow-up    HPI: Randy Wilson is a 56 year old patient with left knee pain.  Had left knee injection 11/17/2020 which did not really give him great relief.  Aspiration of 20 cc performed at that time as well.  Having continued pain which feels like "bone-on-bone pain" on the medial aspect of the left knee.  He works at [employer] which involves a lot of walking.  Reports some occasional mechanical symptoms.              ROS: All systems reviewed are negative as they relate to the chief complaint within the history of present illness.  Patient denies  fevers or chills.   Assessment & Plan: Visit Diagnoses:  1. Acute medial meniscal tear, left, subsequent encounter     Plan: Impression is recurrent effusion following aspiration and injection left knee 6 weeks ago.  Likely meniscal tear versus chondral defect or both.  Not too much arthritis on plain radiographs.  Pain is fairly debilitating.  He has failed a conservative course of treatment including over-the-counter medication aspiration injection as well as structured home rehab program which involves walking and strengthening.  MRI indicated at this time to evaluate for medial meniscal tear versus chondral defect.  Follow-up after that study  Follow-Up Instructions: Return for after MRI.   Orders:  No orders of the defined types were placed in this encounter.  No orders of the defined types were placed in this encounter.     Procedures: No procedures performed   Clinical Data: No additional findings.  Objective: Vital Signs: There were no vitals taken for this visit.  Physical Exam:   Constitutional: Patient appears  well-developed HEENT:  Head: Normocephalic Eyes:EOM are normal Neck: Normal range of motion Cardiovascular: Normal rate Pulmonary/chest: Effort normal Neurologic: Patient is alert Skin: Skin is warm Psychiatric: Patient has normal mood and affect   Ortho Exam: Ortho exam demonstrates full active and passive range of motion of both knees.  Mild effusion is present in the left knee.  Collateral crucial ligaments are stable.  Pedal pulse palpable.  Does have medial greater than lateral joint line tenderness with negative patellar apprehension intact extensor mechanism which is nontender and functional.  No groin pain with internal external rotation of the leg.  Specialty Comments:  No specialty comments available.  Imaging: No results found.   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   Asthma 04/24/2015   Hx of colonic polyp 11/08/2014   History of colonic polyps 09/17/2014   Past Medical History:  Diagnosis Date   Allergy  Asthma AS CHILD   NONE NOW   Chronic enteritis    Hemorrhoids    Inflammatory polyps of colon (HCC)    Rectal bleeding NONE IN LAST 3 MONTHS   Shoulder pain    RIGHT    Family History  Problem Relation Age of Onset   Asthma Mother 75       asthma attack    Stroke Brother        Myocardial infarction   Stroke Brother    Asthma Sister    Colon cancer Neg Hx    Stomach cancer Neg Hx     Rectal cancer Neg Hx    Esophageal cancer Neg Hx    Colon polyps Neg Hx    Allergic rhinitis Neg Hx    Angioedema Neg Hx    Eczema Neg Hx    Urticaria Neg Hx    Liver cancer Neg Hx    Pancreatic cancer Neg Hx     Past Surgical History:  Procedure Laterality Date   ANKLE SURGERY Left 2002   bone spurs removed   COLONOSCOPY     COLONSCOPY     X 4   LAPAROSCOPIC ILEOCECECTOMY N/A 11/02/2015   Procedure: LAPAROSCOPIC ILEOCECECTOMY;  Surgeon: Leighton Ruff, MD;  Location: [facility];  Service: General;  Laterality: N/A;   POLYPECTOMY     SHOULDER SURGERY Left 2010   bone spurs removed    Social History   Occupational History   Occupation: Recieving Clerk    Comment: [employer]  Tobacco Use   Smoking status: Former    Packs/day: 1.00    Years: 25.00    Pack years: 25.00    Types: Cigarettes    Quit date: 01/21/2006    Years since quitting: 14.9   Smokeless tobacco: Never  Vaping Use   Vaping Use: Never used  Substance and Sexual Activity   Alcohol use: Yes    Alcohol/week: 6.0 standard drinks    Types: 6 Cans of beer per week    Comment: [facility]   Drug use: No    Types: Marijuana   Sexual activity: Yes    Comment: with monogamous wife, she takes OCP

## 2021-01-03 ENCOUNTER — Other Ambulatory Visit (INDEPENDENT_AMBULATORY_CARE_PROVIDER_SITE_OTHER): Payer: BC Managed Care – PPO

## 2021-01-03 ENCOUNTER — Encounter: Payer: Self-pay | Admitting: Gastroenterology

## 2021-01-03 ENCOUNTER — Ambulatory Visit: Payer: BC Managed Care – PPO | Admitting: Gastroenterology

## 2021-01-03 VITALS — BP 124/80 | HR 65 | Ht 74.0 in | Wt 233.8 lb

## 2021-01-03 DIAGNOSIS — Z79899 Other long term (current) drug therapy: Secondary | ICD-10-CM

## 2021-01-03 DIAGNOSIS — K5000 Crohn's disease of small intestine without complications: Secondary | ICD-10-CM

## 2021-01-03 DIAGNOSIS — Z7960 Long term (current) use of unspecified immunomodulators and immunosuppressants: Secondary | ICD-10-CM

## 2021-01-03 LAB — COMPREHENSIVE METABOLIC PANEL
ALT: 15 U/L (ref 0–53)
AST: 17 U/L (ref 0–37)
Albumin: 4.6 g/dL (ref 3.5–5.2)
Alkaline Phosphatase: 69 U/L (ref 39–117)
BUN: 14 mg/dL (ref 6–23)
CO2: 28 mEq/L (ref 19–32)
Calcium: 9.3 mg/dL (ref 8.4–10.5)
Chloride: 103 mEq/L (ref 96–112)
Creatinine, Ser: 1.05 mg/dL (ref 0.40–1.50)
GFR: 78.8 mL/min (ref >60.00)
Glucose, Bld: 92 mg/dL (ref 70–99)
Potassium: 4.1 mEq/L (ref 3.5–5.1)
Sodium: 138 mEq/L (ref 135–145)
Total Bilirubin: 0.6 mg/dL (ref 0.2–1.2)
Total Protein: 7.7 g/dL (ref 6.0–8.3)

## 2021-01-03 LAB — CBC WITH DIFFERENTIAL/PLATELET
Basophils Absolute: 0 10*3/uL (ref 0.0–0.1)
Basophils Relative: 0.4 % (ref 0.0–3.0)
Eosinophils Absolute: 0.3 10*3/uL (ref 0.0–0.7)
Eosinophils Relative: 4.8 % (ref 0.0–5.0)
HCT: 43.9 % (ref 39.0–52.0)
Hemoglobin: 14.9 g/dL (ref 13.0–17.0)
Lymphocytes Relative: 27.9 % (ref 12.0–46.0)
Lymphs Abs: 1.9 10*3/uL (ref 0.7–4.0)
MCHC: 33.9 g/dL (ref 30.0–36.0)
MCV: 86.4 fl (ref 78.0–100.0)
Monocytes Absolute: 0.7 10*3/uL (ref 0.1–1.0)
Monocytes Relative: 9.9 % (ref 3.0–12.0)
Neutro Abs: 4 10*3/uL (ref 1.4–7.7)
Neutrophils Relative %: 57 % (ref 43.0–77.0)
Platelets: 249 10*3/uL (ref 150.0–400.0)
RBC: 5.08 Mil/uL (ref 4.22–5.81)
RDW: 13.5 % (ref 11.5–15.5)
WBC: 7 10*3/uL (ref 4.0–10.5)

## 2021-01-03 NOTE — Progress Notes (Signed)
[facility] Progress Note  Chief Complaint: Crohn's ileitis  Subjective  History: I last saw Mr. Hausman in February of this year for his Crohn's disease, details of that outlined in that clinic note.  After various delays including some clinic no-shows and miscommunication about therapy, he started Humira in late March. He was scheduled for a clinic visit with our APP recently and did not show.  We then contacted him and set up today's visit.  Aldo is feeling well lately.  He has no injection site reactions or apparent side effects from the Humira.  He has been doing it every 2 weeks after the induction phase and says he has no abdominal pain, less frequent diarrhea and no rectal bleeding.  ROS: Cardiovascular:  no chest pain Respiratory: no dyspnea No fevers chills or weight loss Seasonal allergy Remainder of systems negative except as above The patient's Past Medical, Family and Social History were reviewed and are on file in the EMR. Past Medical History:  Diagnosis Date   Allergy    Asthma AS CHILD   NONE NOW   Chronic enteritis    Hemorrhoids    Inflammatory polyps of colon (HCC)    Rectal bleeding NONE IN LAST 3 MONTHS   Shoulder pain    RIGHT    Objective:  Med list reviewed  Current Outpatient Medications:    Adalimumab (HUMIRA PEN) 40 MG/0.4ML PNKT, Inject 40 mg into the skin every 14 (fourteen) days., Disp: 2 each, Rfl: 6   allopurinol (ZYLOPRIM) 100 MG tablet, Take 1 tablet (100 mg total) by mouth daily., Disp: 90 tablet, Rfl: 3   cetirizine (ZYRTEC) 10 MG tablet, Take 1 tablet (10 mg total) by mouth daily., Disp: 30 tablet, Rfl: 11   Vital signs in last 24 hrs: Vitals:   01/03/21 1321  BP: 124/80  Pulse: 65  SpO2: 97%   Wt Readings from Last 3 Encounters:  01/03/21 233 lb 12.8 oz (106.1 kg)  10/31/20 241 lb 9.6 oz (109.6 kg)  07/19/20 237 lb (107.5 kg)    Physical Exam  Well-appearing HEENT: sclera anicteric, oral mucosa moist without  lesions Neck: supple, no thyromegaly, JVD or lymphadenopathy Cardiac: RRR without murmurs, S1S2 heard, no peripheral edema Pulm: clear to auscultation bilaterally, normal RR and effort noted Abdomen: soft, no tenderness, with active bowel sounds. No guarding or palpable hepatosplenomegaly. Skin; warm and dry, no jaundice or rash  Labs:  Hepatic Function Latest Ref Rng & Units 07/15/2020 01/08/2020 12/07/2019  Total Protein 6.0 - 8.5 g/dL 7.5 7.9 7.0  Albumin 3.8 - 4.9 g/dL 4.6 4.8 4.4  AST 0 - 40 IU/L 24 15 11   ALT 0 - 44 IU/L 30 18 13   Alk Phosphatase 44 - 121 IU/L 78 71 84  Total Bilirubin 0.0 - 1.2 mg/dL 0.5 0.5 0.4  Bilirubin, Direct 0.00 - 0.40 mg/dL 0.15 - -    ___________________________________________ Radiologic studies:   ____________________________________________ Other:   _____________________________________________ Assessment & Plan  Assessment: Encounter Diagnoses  Name Primary?   Crohn's disease of small intestine without complication ([place]) Yes   Long-term use of immunosuppressant medication    Small bowel Crohn's disease, recurrent after surgery.  He has now been on Humira monotherapy for about 4 and half months.  So far he is tolerating it well and seems to have good clinical response. Curiously, he never had significant GI symptoms, or least not that he would admit to.  His diagnosis was made because of a giant  inflammatory polyp at the ileocecal valve.  His ultimate resection pathology was consistent with Crohn's disease.    Plan: Office visit in 3 months.  At that point we will be talking about a colonoscopy to assess Crohn's activity toward the end of this year or early next year. Depending on that activity, I can then decide whether he needs combination therapy with azathioprine.  I have been reluctant to add that so far due to intermittent compliance with follow-up.  Hepatitis A antibody to see if he is immune even though he only got 2 out of the 3  vaccinations. CBC and CMP as well  He tells me he needs the Shingrix vaccine but had not gotten around to doing it.  I asked him to contact primary care, and I will message his PCP along with today's note so they can reach out to him about that.  It is especially important he gets that since he is on chronic immunosuppressive therapy. Patient was also advised to get annual flu vaccine and periodic COVID booster when available.  31 minutes were spent on this encounter (including chart review, history/exam, counseling/coordination of care, and documentation) > 50% of that time was spent on counseling and coordination of care.  Topics discussed included: See above.  Nelida Meuse III

## 2021-01-03 NOTE — Patient Instructions (Signed)
If you are age 57 or older, your body mass index should be between 23-30. Your Body mass index is 30.02 kg/m. If this is out of the aforementioned range listed, please consider follow up with your Primary Care Provider.  If you are age 19 or younger, your body mass index should be between 19-25. Your Body mass index is 30.02 kg/m. If this is out of the aformentioned range listed, please consider follow up with your Primary Care Provider.   __________________________________________________________  The [facility] providers would like to encourage you to use [facility] to communicate with providers for non-urgent requests or questions.  Due to long hold times on the telephone, sending your provider a message by [facility] may be a faster and more efficient way to get a response.  Please allow 48 business hours for a response.  Please remember that this is for non-urgent requests.   Your provider has requested that you go to the basement level for lab work before leaving today. Press "B" on the elevator. The lab is located at the first door on the left as you exit the elevator.  Due to recent changes in healthcare laws, you may see the results of your imaging and laboratory studies on MyChart before your provider has had a chance to review them.  We understand that in some cases there may be results that are confusing or concerning to you. Not all laboratory results come back in the same time frame and the provider may be waiting for multiple results in order to interpret others.  Please give [place] 48 hours in order for your provider to thoroughly review all the results before contacting the office for clarification of your results.   It was a pleasure to see you today!  Thank you for trusting me with your gastrointestinal care!

## 2021-01-04 LAB — HEPATITIS A ANTIBODY, TOTAL: Hepatitis A AB,Total: REACTIVE — AB

## 2021-01-07 ENCOUNTER — Other Ambulatory Visit: Payer: Self-pay | Admitting: Registered Nurse

## 2021-01-07 DIAGNOSIS — Z8739 Personal history of other diseases of the musculoskeletal system and connective tissue: Secondary | ICD-10-CM

## 2021-01-16 ENCOUNTER — Ambulatory Visit
Admission: RE | Admit: 2021-01-16 | Discharge: 2021-01-16 | Disposition: A | Discharge status: Home / Self Care | Payer: BC Managed Care – PPO | Source: Ambulatory Visit | Attending: Orthopedic Surgery | Admitting: Orthopedic Surgery

## 2021-01-16 ENCOUNTER — Other Ambulatory Visit: Payer: Self-pay

## 2021-01-16 DIAGNOSIS — S83242D Other tear of medial meniscus, current injury, left knee, subsequent encounter: Secondary | ICD-10-CM

## 2021-01-17 ENCOUNTER — Telehealth: Payer: Self-pay | Admitting: Medical

## 2021-01-17 NOTE — Telephone Encounter (Signed)
Pt coming in tomorrow for Shingles shot. He stated that he checked with his insurance company to make sure they will pay.

## 2021-01-18 ENCOUNTER — Other Ambulatory Visit: Payer: BC Managed Care – PPO

## 2021-01-22 ENCOUNTER — Encounter: Payer: Self-pay | Admitting: Emergency Medicine

## 2021-01-22 ENCOUNTER — Other Ambulatory Visit: Payer: Self-pay

## 2021-01-22 ENCOUNTER — Emergency Department: Payer: BC Managed Care – PPO

## 2021-01-22 ENCOUNTER — Emergency Department
Admission: EM | Admit: 2021-01-22 | Discharge: 2021-01-22 | Disposition: A | Discharge status: Home / Self Care | Payer: BC Managed Care – PPO | Attending: Emergency Medicine | Admitting: Emergency Medicine

## 2021-01-22 DIAGNOSIS — Z87891 Personal history of nicotine dependence: Secondary | ICD-10-CM | POA: Insufficient documentation

## 2021-01-22 DIAGNOSIS — Z79899 Other long term (current) drug therapy: Secondary | ICD-10-CM | POA: Insufficient documentation

## 2021-01-22 DIAGNOSIS — W19XXXA Unspecified fall, initial encounter: Secondary | ICD-10-CM

## 2021-01-22 DIAGNOSIS — S62101A Fracture of unspecified carpal bone, right wrist, initial encounter for closed fracture: Secondary | ICD-10-CM

## 2021-01-22 DIAGNOSIS — J45909 Unspecified asthma, uncomplicated: Secondary | ICD-10-CM | POA: Diagnosis not present

## 2021-01-22 DIAGNOSIS — S62112A Displaced fracture of triquetrum [cuneiform] bone, left wrist, initial encounter for closed fracture: Secondary | ICD-10-CM | POA: Insufficient documentation

## 2021-01-22 DIAGNOSIS — S52571A Other intraarticular fracture of lower end of right radius, initial encounter for closed fracture: Secondary | ICD-10-CM | POA: Insufficient documentation

## 2021-01-22 DIAGNOSIS — R7303 Prediabetes: Secondary | ICD-10-CM | POA: Insufficient documentation

## 2021-01-22 DIAGNOSIS — W11XXXA Fall on and from ladder, initial encounter: Secondary | ICD-10-CM | POA: Diagnosis not present

## 2021-01-22 DIAGNOSIS — S6991XA Unspecified injury of right wrist, hand and finger(s), initial encounter: Secondary | ICD-10-CM | POA: Diagnosis present

## 2021-01-22 DIAGNOSIS — S62111A Displaced fracture of triquetrum [cuneiform] bone, right wrist, initial encounter for closed fracture: Secondary | ICD-10-CM

## 2021-01-22 HISTORY — DX: Fracture of unspecified carpal bone, right wrist, initial encounter for closed fracture: S62.101A

## 2021-01-22 MED ORDER — OXYCODONE-ACETAMINOPHEN 5-325 MG PO TABS
1.0000 | ORAL_TABLET | Freq: Once | ORAL | Status: AC
  Administered 2021-01-22: 1 via ORAL
  Filled 2021-01-22: qty 1

## 2021-01-22 MED ORDER — OXYCODONE-ACETAMINOPHEN 5-325 MG PO TABS: 1.0000 | ORAL_TABLET | Freq: Four times a day (QID) | ORAL | 0 refills | Status: DC | PRN

## 2021-01-22 NOTE — ED Provider Notes (Signed)
[facility] Emergency Department Provider Note  ____________________________________________  Time seen: Approximately 3:16 PM  I have reviewed the triage vital signs and the nursing notes.   HISTORY  Chief Complaint Fall and Wrist Pain    HPI Randy Wilson is a 55 y.o. male who presents the emergency department complaining of right wrist pain after falling off a ladder.  Patient states that he was coming down off of a ladder when he slipped, fell and tried to catch himself with an extended right arm.  Patient landed on the wrist area.  Initially he thought he jammed his wrist but given the increasing pain and swelling he was concerned at this point that he may have an underlying fracture.  No history of previous fractures to the wrist.  In this fall he denied any other injury or complaint.  He did not hit his head or lose consciousness.  He is not complaining of any headache, visual changes, neck pain.  No other musculoskeletal pain.  All pain is located in his right wrist.       Past Medical History:  Diagnosis Date   Allergy    Asthma AS CHILD   NONE NOW   Chronic enteritis    Hemorrhoids    Inflammatory polyps of colon ([place])    Rectal bleeding NONE IN LAST 3 MONTHS   Shoulder pain    RIGHT    Patient Active Problem List   Diagnosis Date Noted   Residual hemorrhoidal skin tags 03/31/2020   Internal thrombosed hemorrhoids 03/31/2020   Prediabetes 01/20/2020   History of elevated glucose 01/20/2020   Erectile dysfunction 01/20/2020   Encounter for orthopedic follow-up care 11/25/2018   Pain in joint of right shoulder 10/16/2018   Stiffness of right shoulder joint 10/16/2018   Complete rotator cuff tear or rupture of unspecified shoulder, not specified as traumatic 10/03/2018   Traumatic complete tear of rotator cuff 09/19/2018   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 Authorizing Provider  oxyCODONE-acetaminophen (PERCOCET/ROXICET) 5-325 MG tablet Take 1 tablet by mouth every 6 (six) hours as needed for severe pain. 01/22/21  Yes Tatyana Biber, Charline Bills, PA-C  Adalimumab (HUMIRA PEN) 40 MG/0.4ML PNKT Inject 40 mg into the skin every 14 (fourteen) days. 07/20/20   Doran Stabler, MD  allopurinol (ZYLOPRIM) 100 MG tablet TAKE 1 TABLET BY MOUTH EVERY DAY 01/09/21   Maximiano Coss, NP  cetirizine (ZYRTEC) 10 MG tablet Take 1 tablet (10 mg total) by mouth daily. 07/09/18   Forrest Moron, MD    Allergies Tramadol, Other, and Shrimp [shellfish allergy]  Family History  Problem Relation Age of Onset   Asthma Mother 21       asthma attack    Stroke Brother        Myocardial infarction   Stroke Brother  Asthma Sister    Colon cancer Neg Hx    Stomach cancer Neg Hx    Rectal cancer Neg Hx    Esophageal cancer Neg Hx    Colon polyps Neg Hx    Allergic rhinitis Neg Hx    Angioedema Neg Hx    Eczema Neg Hx    Urticaria Neg Hx    Liver cancer Neg Hx    Pancreatic cancer Neg Hx     Social History Social History   Tobacco Use   Smoking status: Former    Packs/day: 1.00    Years: 25.00    Pack years: 25.00    Types: Cigarettes    Quit date: 01/21/2006    Years since quitting: 15.0   Smokeless tobacco: Never  Vaping Use   Vaping Use: Never used  Substance Use Topics   Alcohol use: Yes    Alcohol/week: 6.0 standard drinks    Types: 6 Cans of beer per week    Comment: OCC    Drug use: No    Types: Marijuana     Review of Systems  Constitutional: No fever/chills Eyes: No visual changes. No discharge ENT: No upper respiratory complaints. Cardiovascular: no chest pain. Respiratory: no cough. No SOB. Gastrointestinal: No abdominal pain.  No nausea, no vomiting.  No diarrhea.  No constipation. Musculoskeletal: Right wrist pain Skin: Negative for rash, abrasions, lacerations, ecchymosis. Neurological: Negative for headaches, focal weakness or numbness.  10 System ROS otherwise negative.  ____________________________________________   PHYSICAL EXAM:  VITAL SIGNS: ED Triage Vitals  Enc Vitals Group     BP 01/22/21 1230 (!) 145/79     Pulse Rate 01/22/21 1230 76     Resp 01/22/21 1230 20     Temp 01/22/21 1230 98.2 F (36.8 C)     Temp Source 01/22/21 1230 Oral     SpO2 01/22/21 1230 95 %     Weight 01/22/21 1226 228 lb (103.4 kg)     Height 01/22/21 1226 6' 2"  (1.88 m)     Head Circumference --      Peak Flow --      Pain Score 01/22/21 1226 10     Pain Loc --      Pain Edu? --      Excl. in [place]? --      Constitutional: Alert and oriented. Well appearing and in no acute distress. Eyes: Conjunctivae are normal. PERRL. EOMI. Head: Atraumatic. ENT:      Ears:       Nose: No congestion/rhinnorhea.      Mouth/Throat: Mucous membranes are moist.  Neck: No stridor.    Cardiovascular: Normal rate, regular rhythm. Normal S1 and S2.  Good peripheral circulation. Respiratory: Normal respiratory effort without tachypnea or retractions. Lungs CTAB. Good air entry to the bases with no decreased or absent breath sounds. Musculoskeletal: Full range of motion to all extremities. No gross deformities appreciated.  Visualization of the right wrist reveals moderate edema, ecchymosis about the wrist itself.  No open wounds.  Patient has limited range of motion at this time due to pain and swelling.  Patient with tenderness over the distal radius with no  palpable abnormality.  Mild tenderness into the dorsal hand without palpable findings here either.  Full range of motion of all digits.  Sensation capillary refill intact all digits. Neurologic:  Normal speech and language. No gross focal neurologic deficits are appreciated.  Skin:  Skin is warm, dry and intact. No rash noted. Psychiatric:  Mood and affect are normal. Speech and behavior are normal. Patient exhibits appropriate insight and judgement.   ____________________________________________   LABS (all labs ordered are listed, but only abnormal results are displayed)  Labs Reviewed - No data to display ____________________________________________  EKG   ____________________________________________  RADIOLOGY I personally viewed and evaluated these images as part of my medical decision making, as well as reviewing the written report by the radiologist.  ED Provider Interpretation: Findings concerning for distal radius fracture.  This is nondisplaced.  There are calcifications looked at the pisiform and triquetrum that are concerning for a fracture fragment from the triquetrum.  DG Wrist Complete Right  Result Date: 01/22/2021 CLINICAL DATA:  Pain and swelling after fall. EXAM: RIGHT WRIST - COMPLETE 3+ VIEW COMPARISON:  None. FINDINGS: There is a comminuted mildly displaced fracture through the distal radius extending into the radiocarpal joint. There is a small calcification adjacent to the triquetrum and pisiform on the frontal view. There is a calcification in the soft tissues over the dorsum of the wrist distally. No other abnormalities. IMPRESSION: 1. Comminuted mildly displaced fracture through the distal radius extending into the radiocarpal joint. 2. There is a soft tissue calcification adjacent to the pisiform and the triquetrum on the frontal view which is suspicious for a small fracture fragment, likely from the triquetrum. 3. There is a soft tissue calcification along the  dorsum of the distal wrist at the level of the proximal metacarpals. This is more distally located than the calcification adjacent to the triquetrum and the pisiform on the frontal view and is suspicious for a separate fracture fragment of unknown source. Electronically Signed   By: Dorise Bullion III M.D.   On: 01/22/2021 13:23    ____________________________________________    PROCEDURES  Procedure(s) performed:    Procedures    Medications  oxyCODONE-acetaminophen (PERCOCET/ROXICET) 5-325 MG per tablet 1 tablet (1 tablet Oral Given 01/22/21 1531)     ____________________________________________   INITIAL IMPRESSION / ASSESSMENT AND PLAN / ED COURSE  Pertinent labs & imaging results that were available during my care of the patient were reviewed by me and considered in my medical decision making (see chart for details).  Review of the [place] CSRS was performed in accordance of the [place] prior to dispensing any controlled drugs.           Patient's diagnosis is consistent with refracture.  Patient presented to the emergency department complaining of wrist pain after an injury that occurred last night.  Findings on physical exam were concerning for fracture which were confirmed with x-ray.  Patient has a distal radius fracture as well as what appears to be a small cortical disruption consistent with a triquetral fracture as well.  Patient will be splinted.  Pain medications prescribed for the patient.  Follow-up with orthopedics for further management.  Return precautions discussed with the patient..  Patient is given ED precautions to return to the ED for any worsening or new symptoms.     ____________________________________________  FINAL CLINICAL IMPRESSION(S) / ED DIAGNOSES  Final diagnoses:  Fall, initial encounter  Other closed intra-articular fracture of distal end of right radius, initial encounter  Closed chip fracture of triquetrum of right wrist, initial encounter       NEW MEDICATIONS STARTED DURING THIS VISIT:  ED Discharge Orders          Ordered    oxyCODONE-acetaminophen (PERCOCET/ROXICET) 5-325 MG tablet  Every 6 hours PRN  01/22/21 1516                This chart was dictated using voice recognition software/Dragon. Despite best efforts to proofread, errors can occur which can change the meaning. Any change was purely unintentional.    Darletta Moll, PA-C 01/22/21 1550    Naaman Plummer, MD 01/22/21 [phone]

## 2021-01-23 NOTE — Progress Notes (Signed)
Needs f/u thx

## 2021-01-25 ENCOUNTER — Telehealth: Payer: Self-pay | Admitting: Orthopedic Surgery

## 2021-01-25 NOTE — Telephone Encounter (Signed)
Pt called back regarding message below. He broke his wrist over the weekend and [facility] referred [facility].  I set up an appt with Lurena Joiner for Pts Mri follow up on the 8th.

## 2021-01-25 NOTE — Telephone Encounter (Signed)
Called pt in reference to sch'ing the scan review. Pt stated he is going to [facility] right now as they can get him in soon and he has an appt with them this coming Friday. Pt stated he will follow up with Marlou Sa afterwards and will call back to sch that appt.

## 2021-02-01 ENCOUNTER — Other Ambulatory Visit: Payer: Self-pay | Admitting: Orthopedic Surgery

## 2021-02-01 DIAGNOSIS — M25531 Pain in right wrist: Secondary | ICD-10-CM

## 2021-02-02 ENCOUNTER — Ambulatory Visit: Payer: BC Managed Care – PPO | Admitting: Surgical

## 2021-02-02 ENCOUNTER — Telehealth: Payer: Self-pay

## 2021-02-02 ENCOUNTER — Encounter: Payer: Self-pay | Admitting: Surgical

## 2021-02-02 ENCOUNTER — Other Ambulatory Visit: Payer: Self-pay

## 2021-02-02 DIAGNOSIS — M1712 Unilateral primary osteoarthritis, left knee: Secondary | ICD-10-CM | POA: Diagnosis not present

## 2021-02-02 NOTE — Telephone Encounter (Signed)
Noted  

## 2021-02-02 NOTE — Telephone Encounter (Signed)
Please submit for gel inj left knee-Dean/Luke.

## 2021-02-02 NOTE — Progress Notes (Signed)
Office Visit Note   Patient: Randy Wilson           Date of Birth: [birth year]           MRN: [id number] Visit Date: 02/02/2021 Requested by: Carlena Hurl, PA-C [address] [place],  [place] [address] PCP: Carlena Hurl, PA-C  Subjective: Chief Complaint  Patient presents with   Left Knee - Pain    HPI: Randy Wilson is a 55 y.o. male who presents to the office for MRI review. Patient denies any changes in symptoms aside from minor improvement in his left knee pain.  Continues to complain mainly of medial sided left knee pain.  Recently had a injury to his right wrist and is currently in splint.  Has CT scan pending for that and has follow-up appointment with a hand surgeon.  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  Since being out of work his pain has improved.  MRI results revealed: MR Knee Left w/o contrast  Result Date: 01/17/2021 CLINICAL DATA:  Left knee pain.  Pain for 3 months. EXAM: MRI OF THE LEFT KNEE WITHOUT CONTRAST TECHNIQUE: Multiplanar, multisequence MR imaging of the knee was performed. No intravenous contrast was administered. COMPARISON:  None. FINDINGS: MENISCI Medial: Degeneration of the medial meniscus. Mild peripheral meniscal extrusion. No discrete meniscal tear. Lateral: Intact. LIGAMENTS Cruciates: ACL and PCL are intact. Collaterals: Medial collateral ligament is intact. Lateral collateral ligament complex is intact. CARTILAGE Patellofemoral: Partial-thickness cartilage loss of the trochlear groove. Partial-thickness cartilage loss of the patellar apex. Medial: High-grade partial-thickness cartilage loss of the medial femoral condyle. Partial-thickness cartilage loss of the medial tibial plateau. Subchondral reactive marrow edema at the periphery of the medial femoral condyle. Lateral:  No chondral defect. JOINT: Large joint effusion. Normal Hoffa's fat-pad. No plical thickening. POPLITEAL FOSSA: Popliteus tendon  is intact. Small Baker's cyst. EXTENSOR MECHANISM: Intact quadriceps tendon. Intact patellar tendon. Intact lateral patellar retinaculum. Intact medial patellar retinaculum. Intact MPFL. BONES: No aggressive osseous lesion. No fracture or dislocation. Other: No fluid collection or hematoma. Muscles are normal. IMPRESSION: 1. No meniscal or ligamentous injury of the left knee. 2. Partial-thickness cartilage loss of the trochlear groove. Partial-thickness cartilage loss of the patellar apex. 3. High-grade partial-thickness cartilage loss of the medial femoral condyle with mild subchondral reactive marrow edema along the periphery. Partial-thickness cartilage loss of the medial tibial plateau. Electronically Signed   By: Kathreen Devoid M.D.   On: 01/17/2021 08:43                 ROS: All systems reviewed are negative as they relate to the chief complaint within the history of present illness.  Patient denies fevers or chills.  Assessment & Plan: Visit Diagnoses:  1. Unilateral primary osteoarthritis, left knee     Plan: Randy Wilson is a 55 y.o. male who presents to the office for MRI of left knee reviewed.  Left knee is doing better since he injured his right wrist.  Primary finding to explain patient's pain is partial-thickness cartilage loss of the medial femoral condyle with marrow edema.  There is no finding that would be amenable to arthroscopic intervention.  He has had cortisone injection previously which provided some relief but has not had gel injection.  Plan to preapproved for gel injection for when his knee pain likely return.  Also recommended he maintain his quadricep strength is much as possible and  may be beneficial to look into arch support orthotics due to his flat feet which may contribute to his medial knee pain.  Patient agreed with plan.  He will follow-up with the office as needed for cortisone versus gel injection.  He wants to avoid any type of surgical intervention.  This patient is  diagnosed with osteoarthritis of the knee(s).    Radiographs show evidence of joint space narrowing, osteophytes, subchondral sclerosis and/or subchondral cysts.  This patient has knee pain which interferes with functional and activities of daily living.    This patient has experienced inadequate response, adverse effects and/or intolerance with conservative treatments such as acetaminophen, NSAIDS, topical creams, physical therapy or regular exercise, knee bracing and/or weight loss.   This patient has experienced inadequate response or has a contraindication to intra articular steroid injections for at least 3 months.   This patient is not scheduled to have a total knee replacement within 6 months of starting treatment with viscosupplementation.   Follow-Up Instructions: No follow-ups on file.   Orders:  No orders of the defined types were placed in this encounter.  No orders of the defined types were placed in this encounter.     Procedures: No procedures performed   Clinical Data: No additional findings.  Objective: Vital Signs: There were no vitals taken for this visit.  Physical Exam:  Constitutional: Patient appears well-developed HEENT:  Head: Normocephalic Eyes:EOM are normal Neck: Normal range of motion Cardiovascular: Normal rate Pulmonary/chest: Effort normal Neurologic: Patient is alert Skin: Skin is warm Psychiatric: Patient has normal mood and affect  Ortho Exam: Left knee with 0 degrees extension and greater than 90 degrees of knee flexion.  No effusion present.  Tenderness over the medial joint line.  No tenderness over the lateral joint line.  No patellar crepitus or positive patellar grind test noted.  No calf tenderness.  Able to perform straight leg raise.  5 -/5 quadricep strength.  Flatfoot deformity noted bilaterally.  Specialty Comments:  No specialty comments available.  Imaging: No results found.   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   Asthma 04/24/2015   Hx of colonic polyp 11/08/2014   History of colonic polyps 09/17/2014   Past Medical History:  Diagnosis Date   Allergy    Asthma AS CHILD   NONE NOW   Chronic enteritis    Hemorrhoids    Inflammatory polyps of colon (HCC)    Rectal bleeding NONE IN LAST 3 MONTHS   Shoulder pain    RIGHT    Family History  Problem Relation Age of Onset   Asthma Mother 66       asthma attack    Stroke Brother        Myocardial infarction   Stroke Brother    Asthma Sister    Colon cancer Neg Hx    Stomach cancer Neg Hx    Rectal cancer Neg Hx    Esophageal cancer Neg Hx  Colon polyps Neg Hx    Allergic rhinitis Neg Hx    Angioedema Neg Hx    Eczema Neg Hx    Urticaria Neg Hx    Liver cancer Neg Hx    Pancreatic cancer Neg Hx     Past Surgical History:  Procedure Laterality Date   ANKLE SURGERY Left 2002   bone spurs removed   COLONOSCOPY     COLONSCOPY     X 4   LAPAROSCOPIC ILEOCECECTOMY N/A 11/02/2015   Procedure: LAPAROSCOPIC ILEOCECECTOMY;  Surgeon: Leighton Ruff, MD;  Location: [facility];  Service: General;  Laterality: N/A;   POLYPECTOMY     SHOULDER SURGERY Left 2010   bone spurs removed    Social History   Occupational History   Occupation: Recieving Clerk    Comment: [employer]  Tobacco Use   Smoking status: Former    Packs/day: 1.00    Years: 25.00    Pack years: 25.00    Types:  Cigarettes    Quit date: 01/21/2006    Years since quitting: 15.0   Smokeless tobacco: Never  Vaping Use   Vaping Use: Never used  Substance and Sexual Activity   Alcohol use: Yes    Alcohol/week: 6.0 standard drinks    Types: 6 Cans of beer per week    Comment: [facility]   Drug use: No    Types: Marijuana   Sexual activity: Yes    Comment: with monogamous wife, she takes OCP

## 2021-02-03 ENCOUNTER — Ambulatory Visit
Admission: RE | Admit: 2021-02-03 | Discharge: 2021-02-03 | Disposition: A | Discharge status: Home / Self Care | Payer: BC Managed Care – PPO | Source: Ambulatory Visit | Attending: Orthopedic Surgery | Admitting: Orthopedic Surgery

## 2021-02-03 ENCOUNTER — Other Ambulatory Visit: Payer: BC Managed Care – PPO

## 2021-02-03 DIAGNOSIS — M25531 Pain in right wrist: Secondary | ICD-10-CM

## 2021-02-07 ENCOUNTER — Encounter (HOSPITAL_BASED_OUTPATIENT_CLINIC_OR_DEPARTMENT_OTHER): Payer: Self-pay | Admitting: Orthopedic Surgery

## 2021-02-08 ENCOUNTER — Other Ambulatory Visit: Payer: Self-pay

## 2021-02-08 ENCOUNTER — Encounter (HOSPITAL_BASED_OUTPATIENT_CLINIC_OR_DEPARTMENT_OTHER): Payer: Self-pay | Admitting: Orthopedic Surgery

## 2021-02-08 NOTE — H&P (Signed)
Preoperative History & Physical Exam  Surgeon: Matt Holmes, MD  Diagnosis: right wrist fracture  Planned Procedure: Procedure(s) (LRB): OPEN REDUCTION INTERNAL FIXATION (ORIF) WRIST FRACTURE (Right)  History of Present Illness:   Patient is a 55 y.o. male with symptoms consistent with  right wrist fracture who presents for surgical intervention. The risks, benefits and alternatives of surgical intervention were discussed and informed consent was obtained prior to surgery.  Past Medical History:  Past Medical History:  Diagnosis Date   Crohn's disease of small intestine ([place]) 2017   followed by dr h. danis;  s/p colon resection 11-02-2015,  on humira   History of asthma    child   History of colon polyps    History of COVID-19 05/30/2020   positive result in epicrunny nose/cough x 3 weeks all symptoms resolved   OA (osteoarthritis)    Right wrist fracture    fell off ladder per pt on 02-08-2021   Right wrist fracture, closed, initial encounter 01/22/2021    Past Surgical History:  Past Surgical History:  Procedure Laterality Date   ANKLE SURGERY Left 2002   bone spurs removed   COLONOSCOPY     last one 02-25-2020  by dr h. danis   LAPAROSCOPIC ILEOCECECTOMY N/A 11/02/2015   Procedure: LAPAROSCOPIC ILEOCECECTOMY;  Surgeon: Leighton Ruff, MD;  Location: [facility];  Service: General;  Laterality: N/A;   SHOULDER ARTHROSCOPY W/ SUBACROMIAL DECOMPRESSION AND DISTAL CLAVICLE EXCISION Left 05/07/2008   @[facility]  by dr Berenice Primas;   bone spurs removed    Medications:  Prior to Admission medications   Medication Sig Start Date End Date Taking? Authorizing Provider  EPINEPHrine (EPIPEN 2-PAK) 0.3 mg/0.3 mL IJ SOAJ injection Inject 0.3 mg into the muscle as needed for anaphylaxis.   Yes [provider]  ibuprofen (ADVIL) 200 MG tablet Take 600 mg by mouth every 6 (six) hours as needed. Takes 3 of 200 mg ibuprofen   Yes [provider]  Adalimumab (HUMIRA PEN) 40 MG/0.4ML  PNKT Inject 40 mg into the skin every 14 (fourteen) days. 07/20/20   Doran Stabler, MD  allopurinol (ZYLOPRIM) 100 MG tablet TAKE 1 TABLET BY MOUTH EVERY DAY 01/09/21   Maximiano Coss, NP  cetirizine (ZYRTEC) 10 MG tablet Take 1 tablet (10 mg total) by mouth daily. 07/09/18   Forrest Moron, MD  oxyCODONE-acetaminophen (PERCOCET/ROXICET) 5-325 MG tablet Take 1 tablet by mouth every 6 (six) hours as needed for severe pain. 01/22/21   Cuthriell, Charline Bills, PA-C    Allergies:  Tramadol and Shrimp [shellfish allergy]  Review of Systems: Negative except per HPI.  Physical Exam: Alert and oriented, NAD Head and neck: no masses, normal alignment CV: pulse intact Pulm: no increased work of breathing, respirations even and unlabored Abdomen: non-distended Extremities: extremities warm and well perfused  LABS: Recent Results (from the past 2160 hour(s))  Hepatitis A antibody, total     Status: Abnormal   Collection Time: 01/03/21  2:01 PM  Result Value Ref Range   Hepatitis A AB,Total REACTIVE (A) NON-REACTIVE    Comment: . For additional information, please refer to  http://education.[facility].com/faq/FAQ202  (This link is being provided for informational/ educational purposes only.) .   Comprehensive metabolic panel     Status: None   Collection Time: 01/03/21  2:01 PM  Result Value Ref Range   Sodium 138 135 - 145 mEq/L   Potassium 4.1 3.5 - 5.1 mEq/L   Chloride 103 96 - 112 mEq/L  CO2 28 19 - 32 mEq/L   Glucose, Bld 92 70 - 99 mg/dL   BUN 14 6 - 23 mg/dL   Creatinine, Ser 1.05 0.40 - 1.50 mg/dL   Total Bilirubin 0.6 0.2 - 1.2 mg/dL   Alkaline Phosphatase 69 39 - 117 U/L   AST 17 0 - 37 U/L   ALT 15 0 - 53 U/L   Total Protein 7.7 6.0 - 8.3 g/dL   Albumin 4.6 3.5 - 5.2 g/dL   GFR 78.80 >60.00 mL/min    Comment: Calculated using the CKD-EPI Creatinine Equation (2021)   Calcium 9.3 8.4 - 10.5 mg/dL  CBC with Differential/Platelet     Status: None   Collection  Time: 01/03/21  2:01 PM  Result Value Ref Range   WBC 7.0 4.0 - 10.5 K/uL   RBC 5.08 4.22 - 5.81 Mil/uL   Hemoglobin 14.9 13.0 - 17.0 g/dL   HCT 43.9 39.0 - 52.0 %   MCV 86.4 78.0 - 100.0 fl   MCHC 33.9 30.0 - 36.0 g/dL   RDW 13.5 11.5 - 15.5 %   Platelets 249.0 150.0 - 400.0 K/uL   Neutrophils Relative % 57.0 43.0 - 77.0 %   Lymphocytes Relative 27.9 12.0 - 46.0 %   Monocytes Relative 9.9 3.0 - 12.0 %   Eosinophils Relative 4.8 0.0 - 5.0 %   Basophils Relative 0.4 0.0 - 3.0 %   Neutro Abs 4.0 1.4 - 7.7 K/uL   Lymphs Abs 1.9 0.7 - 4.0 K/uL   Monocytes Absolute 0.7 0.1 - 1.0 K/uL   Eosinophils Absolute 0.3 0.0 - 0.7 K/uL   Basophils Absolute 0.0 0.0 - 0.1 K/uL     Complete History and Physical exam available in the office notes  [name]

## 2021-02-08 NOTE — Progress Notes (Addendum)
Spoke w/ via phone for pre-op interview---pt Lab needs dos----   none per anesthesia, surgery orders pending            Lab results------none COVID test -----patient states asymptomatic no test needed Arrive at -------1030 am 02-09-2021 NPO after MN NO Solid Food.  Clear liquids from MN until---930 am Med rec completed Medications to take morning of surgery -----zyrtec, allopurinol Diabetic medication -----n/a Patient instructed to bring photo id and insurance card day of surgery Patient aware to have Driver (ride ) / caregiver   wife latisha  for 24 hours after surgery  Patient Special Instructions -----none Pre-Op special Istructions -----surgery orders req dr Greta Doom epic ib Patient verbalized understanding of instructions that were given at this phone interview. Patient denies shortness of breath, chest pain, fever, cough at this phone interview.

## 2021-02-09 ENCOUNTER — Encounter (HOSPITAL_BASED_OUTPATIENT_CLINIC_OR_DEPARTMENT_OTHER)
Admission: RE | Disposition: A | Discharge status: Home / Self Care | Payer: Self-pay | Source: Home / Self Care | Attending: Orthopedic Surgery

## 2021-02-09 ENCOUNTER — Encounter (HOSPITAL_BASED_OUTPATIENT_CLINIC_OR_DEPARTMENT_OTHER): Payer: Self-pay | Admitting: Orthopedic Surgery

## 2021-02-09 ENCOUNTER — Ambulatory Visit (HOSPITAL_BASED_OUTPATIENT_CLINIC_OR_DEPARTMENT_OTHER): Payer: BC Managed Care – PPO | Admitting: Anesthesiology

## 2021-02-09 ENCOUNTER — Ambulatory Visit (HOSPITAL_BASED_OUTPATIENT_CLINIC_OR_DEPARTMENT_OTHER)
Admission: RE | Admit: 2021-02-09 | Discharge: 2021-02-09 | Disposition: A | Discharge status: Home / Self Care | Payer: BC Managed Care – PPO | Attending: Orthopedic Surgery | Admitting: Orthopedic Surgery

## 2021-02-09 ENCOUNTER — Other Ambulatory Visit: Payer: Self-pay

## 2021-02-09 DIAGNOSIS — S52571A Other intraarticular fracture of lower end of right radius, initial encounter for closed fracture: Secondary | ICD-10-CM | POA: Diagnosis present

## 2021-02-09 DIAGNOSIS — Z885 Allergy status to narcotic agent status: Secondary | ICD-10-CM | POA: Diagnosis not present

## 2021-02-09 DIAGNOSIS — Z8616 Personal history of COVID-19: Secondary | ICD-10-CM | POA: Insufficient documentation

## 2021-02-09 DIAGNOSIS — W11XXXA Fall on and from ladder, initial encounter: Secondary | ICD-10-CM | POA: Diagnosis not present

## 2021-02-09 DIAGNOSIS — Z79899 Other long term (current) drug therapy: Secondary | ICD-10-CM | POA: Diagnosis not present

## 2021-02-09 HISTORY — PX: ORIF WRIST FRACTURE: SHX2133

## 2021-02-09 HISTORY — DX: Personal history of other diseases of the respiratory system: Z87.09

## 2021-02-09 HISTORY — DX: Personal history of colon polyps, unspecified: Z86.0100

## 2021-02-09 HISTORY — DX: Fracture of unspecified carpal bone, right wrist, initial encounter for closed fracture: S62.101A

## 2021-02-09 HISTORY — DX: Unspecified osteoarthritis, unspecified site: M19.90

## 2021-02-09 HISTORY — DX: Personal history of colon polyps: Z86.010

## 2021-02-09 SURGERY — OPEN REDUCTION INTERNAL FIXATION (ORIF) WRIST FRACTURE
Anesthesia: Regional | Site: Wrist | Laterality: Right

## 2021-02-09 MED ORDER — MIDAZOLAM HCL 2 MG/2ML IJ SOLN
INTRAMUSCULAR | Status: AC
  Filled 2021-02-09: qty 2

## 2021-02-09 MED ORDER — PROPOFOL 10 MG/ML IV BOLUS
INTRAVENOUS | Status: AC
  Filled 2021-02-09: qty 20

## 2021-02-09 MED ORDER — ONDANSETRON HCL 4 MG/2ML IJ SOLN
INTRAMUSCULAR | Status: AC
  Filled 2021-02-09: qty 2

## 2021-02-09 MED ORDER — CEFAZOLIN SODIUM-DEXTROSE 2-4 GM/100ML-% IV SOLN
2.0000 g | INTRAVENOUS | Status: AC
  Administered 2021-02-09: 2 g via INTRAVENOUS

## 2021-02-09 MED ORDER — PROPOFOL 500 MG/50ML IV EMUL
INTRAVENOUS | Status: AC
  Filled 2021-02-09: qty 50

## 2021-02-09 MED ORDER — PROPOFOL 500 MG/50ML IV EMUL
INTRAVENOUS | Status: DC | PRN
  Administered 2021-02-09: 100 ug/kg/min via INTRAVENOUS

## 2021-02-09 MED ORDER — DEXAMETHASONE SODIUM PHOSPHATE 4 MG/ML IJ SOLN
INTRAMUSCULAR | Status: DC | PRN
  Administered 2021-02-09: 5 mg via PERINEURAL

## 2021-02-09 MED ORDER — FENTANYL CITRATE (PF) 100 MCG/2ML IJ SOLN
INTRAMUSCULAR | Status: AC
  Filled 2021-02-09: qty 2

## 2021-02-09 MED ORDER — CELECOXIB 200 MG PO CAPS: 200.0000 mg | ORAL_CAPSULE | Freq: Once | ORAL | Status: DC

## 2021-02-09 MED ORDER — FENTANYL CITRATE (PF) 100 MCG/2ML IJ SOLN: 25.0000 ug | INTRAMUSCULAR | Status: DC | PRN

## 2021-02-09 MED ORDER — PROPOFOL 10 MG/ML IV BOLUS
INTRAVENOUS | Status: DC | PRN
  Administered 2021-02-09: 30 mg via INTRAVENOUS

## 2021-02-09 MED ORDER — CEFAZOLIN SODIUM-DEXTROSE 2-4 GM/100ML-% IV SOLN
INTRAVENOUS | Status: AC
  Filled 2021-02-09: qty 100

## 2021-02-09 MED ORDER — FENTANYL CITRATE (PF) 100 MCG/2ML IJ SOLN
100.0000 ug | Freq: Once | INTRAMUSCULAR | Status: AC
  Administered 2021-02-09: 100 ug via INTRAVENOUS

## 2021-02-09 MED ORDER — LACTATED RINGERS IV SOLN: INTRAVENOUS | Status: DC

## 2021-02-09 MED ORDER — CLONIDINE HCL (ANALGESIA) 100 MCG/ML EP SOLN
EPIDURAL | Status: DC | PRN
  Administered 2021-02-09: 80 ug

## 2021-02-09 MED ORDER — MEPERIDINE HCL 25 MG/ML IJ SOLN: 6.2500 mg | INTRAMUSCULAR | Status: DC | PRN

## 2021-02-09 MED ORDER — ROPIVACAINE HCL 5 MG/ML IJ SOLN
INTRAMUSCULAR | Status: DC | PRN
  Administered 2021-02-09: 40 mL via PERINEURAL

## 2021-02-09 MED ORDER — ACETAMINOPHEN 500 MG PO TABS: 1000.0000 mg | ORAL_TABLET | Freq: Once | ORAL | Status: DC

## 2021-02-09 MED ORDER — MIDAZOLAM HCL 2 MG/2ML IJ SOLN
2.0000 mg | Freq: Once | INTRAMUSCULAR | Status: AC
  Administered 2021-02-09: 2 mg via INTRAVENOUS

## 2021-02-09 MED ORDER — PROMETHAZINE HCL 25 MG/ML IJ SOLN: 6.2500 mg | INTRAMUSCULAR | Status: DC | PRN

## 2021-02-09 SURGICAL SUPPLY — 54 items
BIT DRILL 1.7 (BIT) ×1 IMPLANT
BIT DRILL 2.5 CANN REUSE (DRILL) ×1 IMPLANT
BLADE SURG 15 STRL LF DISP TIS (BLADE) ×3 IMPLANT
BLADE SURG 15 STRL SS (BLADE) ×6
BNDG CMPR 9X4 STRL LF SNTH (GAUZE/BANDAGES/DRESSINGS) ×1
BNDG ELASTIC 4X5.8 VLCR STR LF (GAUZE/BANDAGES/DRESSINGS) ×2 IMPLANT
BNDG ESMARK 4X9 LF (GAUZE/BANDAGES/DRESSINGS) ×2 IMPLANT
BNDG GAUZE ELAST 4 BULKY (GAUZE/BANDAGES/DRESSINGS) ×2 IMPLANT
CLSR STERI-STRIP ANTIMIC 1/2X4 (GAUZE/BANDAGES/DRESSINGS) ×2 IMPLANT
CORD BIPOLAR FORCEPS 12FT (ELECTRODE) ×2 IMPLANT
COVER BACK TABLE 60X90IN (DRAPES) ×2 IMPLANT
CUFF TOURN SGL QUICK 18X4 (TOURNIQUET CUFF) ×1 IMPLANT
DRAPE EXTREMITY T 121X128X90 (DISPOSABLE) ×2 IMPLANT
DRAPE OEC MINIVIEW 54X84 (DRAPES) ×2 IMPLANT
DRAPE SHEET LG 3/4 BI-LAMINATE (DRAPES) ×2 IMPLANT
DRAPE SURG 17X23 STRL (DRAPES) ×2 IMPLANT
GAUZE 4X4 16PLY ~~LOC~~+RFID DBL (SPONGE) ×2 IMPLANT
GAUZE SPONGE 4X4 12PLY STRL (GAUZE/BANDAGES/DRESSINGS) ×2 IMPLANT
GAUZE XEROFORM 1X8 LF (GAUZE/BANDAGES/DRESSINGS) ×11 IMPLANT
GLOVE SURG ENC MOIS LTX SZ7.5 (GLOVE) ×2 IMPLANT
GLOVE SURG UNDER POLY LF SZ7.5 (GLOVE) ×2 IMPLANT
GOWN STRL REUS W/TWL LRG LVL3 (GOWN DISPOSABLE) ×4 IMPLANT
GUIDEWIRE 1.35MM (WIRE) ×3 IMPLANT
HANDPIECE NANOSCOPE (MISCELLANEOUS) ×1 IMPLANT
HIBICLENS CHG 4% 4OZ (MISCELLANEOUS) ×2 IMPLANT
K-WIRE BB-TAK (WIRE) ×2
KIT TURNOVER CYSTO (KITS) ×2 IMPLANT
KWIRE BB-TAK (WIRE) ×1 IMPLANT
NANOSCOPE HANDPIECE ×1 IMPLANT
PACK BASIN DAY SURGERY FS (CUSTOM PROCEDURE TRAY) ×2 IMPLANT
PAD CAST 4YDX4 CTTN HI CHSV (CAST SUPPLIES) ×1 IMPLANT
PADDING CAST ABS 4INX4YD NS (CAST SUPPLIES) ×1
PADDING CAST ABS COTTON 4X4 ST (CAST SUPPLIES) ×1 IMPLANT
PADDING CAST COTTON 4X4 STRL (CAST SUPPLIES) ×2
PLATE WRIST SPANNING TI (Plate) ×1 IMPLANT
SCREW CORT 3.5X18 THRD (Screw) ×3 IMPLANT
SCREW CORTEX LP 2.4X14 (Screw) ×3 IMPLANT
SCREW LOCK 14X2.4XSTVA (Screw) IMPLANT
SCREW LOCKING 2.4X14 (Screw) ×2 IMPLANT
SLING ARM FOAM STRAP XLG (SOFTGOODS) ×1 IMPLANT
SPLINT PLASTER CAST XFAST 3X15 (CAST SUPPLIES) IMPLANT
SPLINT PLASTER CAST XFAST 4X15 (CAST SUPPLIES) IMPLANT
SPLINT PLASTER XTRA FAST SET 4 (CAST SUPPLIES)
SPLINT PLASTER XTRA FASTSET 3X (CAST SUPPLIES)
SUCTION FRAZIER HANDLE 10FR (MISCELLANEOUS) ×2
SUCTION TUBE FRAZIER 10FR DISP (MISCELLANEOUS) IMPLANT
SUT ETHILON 4 0 PS 2 18 (SUTURE) ×3 IMPLANT
SUT VIC AB 2-0 SH 27 (SUTURE) ×2
SUT VIC AB 2-0 SH 27XBRD (SUTURE) IMPLANT
SYR 20ML LL LF (SYRINGE) ×2 IMPLANT
SYR BULB EAR ULCER 3OZ GRN STR (SYRINGE) ×2 IMPLANT
TOWEL OR 17X26 10 PK STRL BLUE (TOWEL DISPOSABLE) ×4 IMPLANT
TUBE CONNECTING 12X1/4 (SUCTIONS) ×1 IMPLANT
UNDERPAD 30X36 HEAVY ABSORB (UNDERPADS AND DIAPERS) ×2 IMPLANT

## 2021-02-09 NOTE — Transfer of Care (Signed)
Immediate Anesthesia Transfer of Care Note  Patient: Randy Wilson  Procedure(s) Performed: Procedure(s) (LRB): OPEN REDUCTION INTERNAL FIXATION (ORIF) WRIST FRACTURE (Right)  Patient Location: PACU  Anesthesia Type: Regional/MAC  Level of Consciousness: awake, oriented, sedated and patient cooperative  Airway & Oxygen Therapy: Patient Spontanous Breathing on room air  Post-op Assessment: Report given to PACU RN and Post -op Vital signs reviewed and stable  Post vital signs: Reviewed and stable  Complications: No apparent anesthesia complications Last Vitals:  Vitals Value Taken Time  BP 130/83 02/09/21 1505  Temp 36.3 C 02/09/21 1505  Pulse 62 02/09/21 1510  Resp 17 02/09/21 1510  SpO2 93 % 02/09/21 1510  Vitals shown include unvalidated device data.  Last Pain:  Vitals:   02/09/21 1041  TempSrc: Oral         Complications: No notable events documented.

## 2021-02-09 NOTE — Progress Notes (Signed)
Assisted Dr. Lissa Hoard with right, ultrasound guided, axillary block. Side rails up, monitors on throughout procedure. See vital signs in flow sheet. Tolerated Procedure well.

## 2021-02-09 NOTE — Anesthesia Procedure Notes (Signed)
Anesthesia Regional Block: Axillary brachial plexus block   Pre-Anesthetic Checklist: , timeout performed,  Correct Patient, Correct Site, Correct Laterality,  Correct Procedure, Correct Position, site marked,  Risks and benefits discussed,  Surgical consent,  Pre-op evaluation,  At surgeon's request and post-op pain management  Laterality: Upper and Right  Prep: chloraprep       Needles:  Injection technique: Single-shot  Needle Type: Stimiplex          Additional Needles:   Procedures:,,,, ultrasound used (permanent image in chart),,    Narrative:  Start time: 02/09/2021 11:20 AM End time: 02/09/2021 11:45 AM Injection made incrementally with aspirations every 5 mL.  Performed by: Personally  Anesthesiologist: Nolon Nations, MD  Additional Notes: BP cuff, SpO2 and EKG monitors applied. Sedation begun. Nerve location verified with ultrasound. Anesthetic injected incrementally, slowly, and after neg aspirations under direct u/s guidance. Good perineural spread. Tolerated well.

## 2021-02-09 NOTE — Interval H&P Note (Signed)
History and Physical Interval Note:  02/09/2021 10:49 AM  Randy Wilson  has presented today for surgery, with the diagnosis of right wrist fracture.  The various methods of treatment have been discussed with the patient and family. After consideration of risks, benefits and other options for treatment, the patient has consented to  Procedure(s) with comments: OPEN REDUCTION INTERNAL FIXATION (ORIF) WRIST FRACTURE (Right) - WITH REGIONAL as a surgical intervention.  The patient's history has been reviewed, patient examined, no change in status, stable for surgery.  I have reviewed the patient's chart and labs.  Questions were answered to the patient's satisfaction.     [name]

## 2021-02-09 NOTE — Op Note (Signed)
OPERATIVE NOTE  DATE OF PROCEDURE: 02/09/2021  SURGEONS: Primary: [name], MD  PREOPERATIVE DIAGNOSIS: right wrist fracture, intra-articular  POSTOPERATIVE DIAGNOSIS: Same  NAME OF PROCEDURE:    Right distal radius open reduction internal fixation, >3 fragments, intra-articular 2.    Right wrist arthroscopy 3.    Right wrist radiographs four views with intraoperative interpretation  ANESTHESIA: Regional Block + MAC  SKIN PREPARATION: Hibiclens  ESTIMATED BLOOD LOSS: Minimal  IMPLANTS: Arthrex bridge plate, 0.062 k wires x 2  Implant Name Type Inv. Item Serial No. Manufacturer Lot No. LRB No. Used Action  ARTHREX BRIDGE PLATE Plate   [employer]  Right 1 Implanted  AR[employer] SCREW 2.4MM X 14MM    [employer]  Right 3 Implanted  AR[employer]X LOCKING SCREW 2.4MM X 14MM    [employer]  Right 1 Implanted  AR[employer] SCREW 3.5MM X 18MM    [employer]  Right 3 Implanted    INDICATIONS:  Ritvik is a 55 y.o. male who has the above preoperative diagnosis. The patient has decided to proceed with surgical intervention.  Risks, benefits and alternatives of operative management were discussed including, but not limited to, risks of anesthesia complications, infection, pain, persistent symptoms, stiffness, need for future surgery.  The patient understands, agrees and elects to proceed with surgery.    DESCRIPTION OF PROCEDURE: The patient was met in the pre-operative area and their identity was verified.  The operative location and laterality was also verified and marked.  The patient was brought to the OR and was placed supine on the table.  After repeat patient identification with the operative team anesthesia was provided and the patient was prepped and draped in the usual sterile fashion.  A final timeout was performed verifying the correction patient, procedure, location and laterality.  Preoperative antibiotics were administered. The right upper extremity was exsanguinated with an Esmarch and  tourniquet inflated to 275mHg.   The surgery began by identifying the 3-4 portal via the soft spot just distal to Lister's tubercle.  A longitudinal nick was made in the skin followed by blunt dissection down to the capsule.  This was then entered with a cannula and the Arthrex Nano scope was utilized to visualize the intra-articular aspect of the right wrist.  Diagnostic arthroscopy was performed identifying the volar ligaments as well as the scapholunate ligament and the fracture site.  The dorsal intra-articular fragment that was depressed was identified as well as the large comminuted radial styloid fragment step-off.  This time attention was turned to reduction of the right distal radius fracture.  dorsal approach was made down to just proximal to Lister tubercle protecting the extensor tendons and a1/4 inch osteotome was utilized to open the dorsal cortex of the right radius.  A FValora Corporalwas utilized to tamp the intra-articular depressed fragment back up to restore the joint surface as best as possible.  At the same time in approach was made to the right radial styloid taking care to protect the sensory nerve and the tendons and 0.062 K wires were placed in the radial styloid to be used as joysticks and for definitive fixation.  With the assistance of the arthroscope the reduction was obtained as best as possible and confirmed with the scope and C-arm fluoroscopy.  The right wrist radiographs were obtained including AP, lateral, internal oblique, external oblique views and interpreted by myself to confirm adequate reduction of the fracture site of the right distal radius as well as placement of K wires to reduce  the radial styloid and free intra-articular fragment of the right wrist.  Finger traps were utilized with 10 pounds of traction for the arthroscopy and to aid in the reduction.  At this time the carpus was still subluxing as was seen on preoperative CT scan in relation to the distal radius and thus a  dorsal spanning plate was selected.  This was marked out over the skin and an incision was made over the index metacarpal.  Skin and subcutaneous tissues were divided and dissection was made down to the bone.  Careful attention was paid to protect the radial sensory nerve branches at this area.  The interval between the second dorsal compartment tendons was identified and the dorsal spanning plate was slid from distal to proximal underneath the second compartment.  Longitudinal incision was made at the proximal end of the plate through skin and subcutaneous tissues and the interval between the second wrist compartment and the outcropper wrist was identified and the plate was exposed.  The PIP plate was pinned in place and confirmed to have adequate length and reduction of the carpus.  Distally combination of locking and nonlocking screws was placed.  Proximally a combination of locking and nonlocking screws was placed in the radial shaft.  C-arm fluoroscopy was utilized to obtain x-rays of the right wrist and forearm and confirmed adequate placement of hardware, appropriate screw lengths and placement of the plate.  The pins were backed out, bent and cut and then reinserted in order to bury the wires.  C-arm fluoroscopy confirmed adequate placement of the wires with adequate reduction of the joint surface of the right radius as best as possible.  The arthroscope was utilized to confirm reduction of the right distal radius intra-articular fracture with greater than 3 fragments.  The tourniquet was deflated and the fingers were pink and warm and well-perfused. Meticulous hemostasis was performed. The wounds were then thoroughly irrigated and attention was turned to closure.  4-0 nylon horizontal mattress interrupted sutures were placed followed by a sterile soft dressing and then a short arm plaster splint. At the end of the case all counts were correct x2.  The patient was brought to the postanesthesia care unit for  recovery in stable condition.   Matt Holmes, MD Orthopaedic Hand Surgery

## 2021-02-09 NOTE — Anesthesia Preprocedure Evaluation (Signed)
Anesthesia Evaluation  Patient identified by MRN, date of birth, ID band Patient awake    Reviewed: Allergy & Precautions, NPO status , Patient's Chart, lab work & pertinent test results  Airway Mallampati: II   Neck ROM: Full    Dental  (+) Partial Upper, Dental Advisory Given   Pulmonary asthma , former smoker,    breath sounds clear to auscultation       Cardiovascular hypertension,  Rhythm:Regular     Neuro/Psych negative neurological ROS  negative psych ROS   GI/Hepatic negative GI ROS, Neg liver ROS, (+)     substance abuse (In past)  , Imflamatory polyp     Endo/Other  negative endocrine ROS  Renal/GU negative Renal ROS     Musculoskeletal  (+) Arthritis ,   Abdominal   Peds  Hematology negative hematology ROS (+) 14/41   Anesthesia Other Findings   Reproductive/Obstetrics                             Anesthesia Physical  Anesthesia Plan  ASA: 2  Anesthesia Plan: Regional   Post-op Pain Management:    Induction: Intravenous  PONV Risk Score and Plan: 1 and Treatment may vary due to age or medical condition, Ondansetron, Propofol infusion and Midazolam  Airway Management Planned: Natural Airway  Additional Equipment:   Intra-op Plan:   Post-operative Plan:   Informed Consent: I have reviewed the patients History and Physical, chart, labs and discussed the procedure including the risks, benefits and alternatives for the proposed anesthesia with the patient or authorized representative who has indicated his/her understanding and acceptance.     Dental advisory given  Plan Discussed with: CRNA  Anesthesia Plan Comments:         Anesthesia Quick Evaluation

## 2021-02-09 NOTE — Discharge Instructions (Addendum)
Orthopaedic Hand Surgery Discharge Instructions  WEIGHT BEARING STATUS: Non weight bearing on operative extremity  DRESSINGS: Please keep your dressing/splint/cast clean and dry until your follow-up appointment. You may shower by placing a waterproof covering over your dressing/splint/cast. Contact your surgeon if your splint/cast gets wet. It will need to be changed to prevent skin breakdown.  PAIN CONTROL: First line medications for post operative pain control are Tylenol (acetaminophen) and Motrin (ibuprofen) if you are able to take these medications. If you have been prescribed a medication these can be taken as breakthrough pain medications. Please note that some narcotic pain medication have acetaminophen added and you should never consume more than 4,05m of acetaminophen in 24 hour period. Also please note that if you are given Toradol (ketoralac) you should not take similar medications simultaneously such as ibuprofen.   ICE/ELEVATION: Ice and elevate your injured extremity as needed. Avoid direct contact of ice with skin.  NERVE BLOCK: will wear off between 12 and 24 hours after surgery. Remove sling once regain sensation and control back to extremity. Can use sling for comfort  Range of motion: work on finger ROM only, do not move wrist, maintain splint  HOME MEDICATIONS: No changes have been made to your home medications.  FOLLOW UP: You will be called after surgery with an appointment date and time, however if you have not received a phone call within 3 days please call during regular office hours at [phone] schedule a post operative appointment.  Please Seek Medical Attention if: Call MD for: pain or pressure in chest, jaw, arm, back, neck  Call MD for: temperature greater than 101 F for more than 24 hours  Call MD for: difficulty breathing Call MD for: Incision redness, bleeding, drainage  Call MD for: palpitations or feeling that the heart is racing  Call MD for:  increased swelling in arm, leg, ankle, or abdomen  Call MD for: lightheadedness, dizziness, fainting Go to ED or call 911 if: chest pain does not go away after 3 nitroglycerin doses taken 5 min apart  Go to ED or call 911 for: any uncontrolled bleeding  Go to ED or call 911 if: unable to reach physician  Discharge Medications: Percocet [id number] #20  Sent through [facility] Orth[facility] Hand [facility]   Post Anesthesia Home Care Instructions  Activity: Get plenty of rest for the remainder of the day. A responsible individual must stay with you for 24 hours following the procedure.  For the next 24 hours, DO NOT: -Drive a car -OpPaediatric nurseDrink alcoholic beverages -Take any medication unless instructed by your physician -Make any legal decisions or sign important papers.  Meals: Start with liquid foods such as gelatin or soup. Progress to regular foods as tolerated. Avoid greasy, spicy, heavy foods. If nausea and/or vomiting occur, drink only clear liquids until the nausea and/or vomiting subsides. Call your physician if vomiting continues.  Special Instructions/Symptoms: Your throat may feel dry or sore from the anesthesia or the breathing tube placed in your throat during surgery. If this causes discomfort, gargle with warm salt water. The discomfort should disappear within 24 hours.

## 2021-02-10 ENCOUNTER — Encounter (HOSPITAL_BASED_OUTPATIENT_CLINIC_OR_DEPARTMENT_OTHER): Payer: Self-pay | Admitting: Orthopedic Surgery

## 2021-02-10 NOTE — Anesthesia Postprocedure Evaluation (Signed)
Anesthesia Post Note  Patient: Randy Wilson  Procedure(s) Performed: OPEN REDUCTION INTERNAL FIXATION (ORIF) WRIST FRACTURE (Right: Wrist)     Patient location during evaluation: PACU Anesthesia Type: Regional Level of consciousness: awake and alert Pain management: pain level controlled Vital Signs Assessment: post-procedure vital signs reviewed and stable Respiratory status: spontaneous breathing Cardiovascular status: stable Anesthetic complications: no   No notable events documented.  Last Vitals:  Vitals:   02/09/21 1530 02/09/21 1600  BP: 137/89 (!) 130/94  Pulse: (!) 57 62  Resp: 15 16  Temp: 36.6 C 36.5 C  SpO2: 94% 97%    Last Pain:  Vitals:   02/09/21 1600  TempSrc:   PainSc: 0-No pain                 Nolon Nations

## 2021-02-28 ENCOUNTER — Other Ambulatory Visit: Payer: Self-pay

## 2021-02-28 ENCOUNTER — Other Ambulatory Visit (INDEPENDENT_AMBULATORY_CARE_PROVIDER_SITE_OTHER): Payer: BC Managed Care – PPO

## 2021-02-28 DIAGNOSIS — Z23 Encounter for immunization: Secondary | ICD-10-CM | POA: Diagnosis not present

## 2021-03-01 ENCOUNTER — Telehealth: Payer: Self-pay | Admitting: Gastroenterology

## 2021-03-01 NOTE — Telephone Encounter (Signed)
The vaccinations will not interfere with Humira.

## 2021-03-01 NOTE — Telephone Encounter (Signed)
Spoke with patient in regards to information, he verbalized understanding and had no concerns at the end of the call.

## 2021-03-01 NOTE — Telephone Encounter (Signed)
Inbound call from patient asking if he could get the shingles and COVID booster vaccines without it interfering with Humira

## 2021-03-03 ENCOUNTER — Telehealth: Payer: Self-pay

## 2021-03-03 NOTE — Telephone Encounter (Signed)
VOB has been submitted, SynviscOne left knee. Pending BV.

## 2021-03-07 ENCOUNTER — Telehealth: Payer: Self-pay

## 2021-03-07 NOTE — Telephone Encounter (Signed)
PA required for SynviscOne, left knee. Completed PA online through Covermymeds Pending PA# [id number]

## 2021-03-14 ENCOUNTER — Telehealth: Payer: Self-pay

## 2021-03-14 NOTE — Telephone Encounter (Signed)
Talked with patient concerning approval for gel injection.  Patient would like to hold off at this time.  Will call back when he is ready to proceed.  Approved for SynviscOne, left knee. Buy & Bill Covered at 100% through his Airline [employer]. No Co-pay PA Approval# [id number] Valid 03/07/2021- 09/02/2021.

## 2021-03-29 ENCOUNTER — Telehealth: Payer: Self-pay

## 2021-03-29 NOTE — Telephone Encounter (Signed)
PA initiated via [facility] for Humira 40 mg every 2 weeks. Will await response from insurance  Key: [id number] - PA Case ID: [id number]

## 2021-03-30 NOTE — Telephone Encounter (Signed)
Received APPROVAL for maintenance dose of Humira 40 mg every 14 days. A copy of the approval letter has been sent to be scanned into patient's chart.

## 2021-04-03 ENCOUNTER — Encounter (HOSPITAL_BASED_OUTPATIENT_CLINIC_OR_DEPARTMENT_OTHER): Payer: Self-pay | Admitting: Orthopedic Surgery

## 2021-04-03 ENCOUNTER — Other Ambulatory Visit: Payer: Self-pay

## 2021-04-03 NOTE — Progress Notes (Addendum)
Spoke w/ via phone for pre-op interview---pt Lab needs dos----none per anesthesia, lm with ashley for surgery orders, note to dr spears epic ib for surgery orders                Lab results------none COVID test -----patient states asymptomatic no test needed Arrive at -------815 am 04-06-2021 NPO after MN NO Solid Food.  Clear liquids from MN until---715 am Med rec completed Medications to take morning of surgery -----allopurinol Diabetic medication -----n/a Patient instructed to bring photo id and insurance card day of surgery Patient aware to have Driver (ride ) / caregiver    for 24 hours after surgery  wife latisha Patient Special Instructions -----none Pre-Op special Istructions -----none Patient verbalized understanding of instructions that were given at this phone interview. Patient denies shortness of breath, chest pain, fever, cough at this phone interview.

## 2021-04-05 NOTE — H&P (Signed)
Preoperative History & Physical Exam  Surgeon: Matt Holmes, MD  Diagnosis: Right wrist distal radius fracture painful hardware  Planned Procedure: Procedure(s) (LRB): Right wrist removal of hardware (Right)  History of Present Illness:   Patient is a 55 y.o. male with symptoms consistent with  Right wrist distal radius fracture painful hardware who presents for surgical intervention. The risks, benefits and alternatives of surgical intervention were discussed and informed consent was obtained prior to surgery.  Past Medical History:  Past Medical History:  Diagnosis Date   Crohn's disease of small intestine ([place]) 2017   followed by dr h. danis;  s/p colon resection 11-02-2015,  on humira   History of asthma    child   History of colon polyps    History of COVID-19 05/30/2020   positive result in epicrunny nose/cough x 3 weeks all symptoms resolved   History of gout    last flare up 1 year ago per pt on 04-03-2021   OA (osteoarthritis)    Right wrist fracture    fell off ladder per pt on 02-08-2021   Right wrist fracture, closed, initial encounter 01/22/2021    Past Surgical History:  Past Surgical History:  Procedure Laterality Date   ANKLE SURGERY Left 2002   bone spurs removed   COLONOSCOPY     last one 02-25-2020  by dr h. danis   LAPAROSCOPIC ILEOCECECTOMY N/A 11/02/2015   Procedure: LAPAROSCOPIC ILEOCECECTOMY;  Surgeon: Leighton Ruff, MD;  Location: [facility];  Service: General;  Laterality: N/A;   ORIF WRIST FRACTURE Right 02/09/2021   Procedure: OPEN REDUCTION INTERNAL FIXATION (ORIF) WRIST FRACTURE;  Surgeon: [name], MD;  Location: [place];  Service: Orthopedics;  Laterality: Right;  WITH REGIONAL   SHOULDER ARTHROSCOPY W/ SUBACROMIAL DECOMPRESSION AND DISTAL CLAVICLE EXCISION Left 05/07/2008   @[facility]  by dr Berenice Primas;   bone spurs removed    Medications:  Prior to Admission medications   Medication Sig Start Date End Date Taking?  Authorizing Provider  Adalimumab (HUMIRA PEN) 40 MG/0.4ML PNKT Inject 40 mg into the skin every 14 (fourteen) days. 07/20/20   Doran Stabler, MD  allopurinol (ZYLOPRIM) 100 MG tablet TAKE 1 TABLET BY MOUTH EVERY DAY 01/09/21   Maximiano Coss, NP  cetirizine (ZYRTEC) 10 MG tablet Take 1 tablet (10 mg total) by mouth daily. Patient taking differently: Take 10 mg by mouth as needed. 07/09/18   Forrest Moron, MD  EPINEPHrine (EPIPEN 2-PAK) 0.3 mg/0.3 mL IJ SOAJ injection Inject 0.3 mg into the muscle as needed for anaphylaxis.    [provider]  ibuprofen (ADVIL) 200 MG tablet Take 600 mg by mouth every 6 (six) hours as needed. Takes 3 of 200 mg ibuprofen    [provider]    Allergies:  Tramadol and Shrimp [shellfish allergy]  Review of Systems: Negative except per HPI.  Physical Exam: Alert and oriented, NAD Head and neck: no masses, normal alignment CV: pulse intact Pulm: no increased work of breathing, respirations even and unlabored Abdomen: non-distended Extremities: extremities warm and well perfused  LABS: No results found for this or any previous visit (from the past 2160 hour(s)).   Complete History and Physical exam available in the office notes  [name]

## 2021-04-06 ENCOUNTER — Other Ambulatory Visit: Payer: Self-pay

## 2021-04-06 ENCOUNTER — Encounter (HOSPITAL_BASED_OUTPATIENT_CLINIC_OR_DEPARTMENT_OTHER): Payer: Self-pay | Admitting: Orthopedic Surgery

## 2021-04-06 ENCOUNTER — Ambulatory Visit (HOSPITAL_BASED_OUTPATIENT_CLINIC_OR_DEPARTMENT_OTHER): Payer: BC Managed Care – PPO | Admitting: Anesthesiology

## 2021-04-06 ENCOUNTER — Encounter (HOSPITAL_BASED_OUTPATIENT_CLINIC_OR_DEPARTMENT_OTHER)
Admission: RE | Disposition: A | Discharge status: Home / Self Care | Payer: Self-pay | Source: Ambulatory Visit | Attending: Orthopedic Surgery

## 2021-04-06 ENCOUNTER — Ambulatory Visit (HOSPITAL_BASED_OUTPATIENT_CLINIC_OR_DEPARTMENT_OTHER)
Admission: RE | Admit: 2021-04-06 | Discharge: 2021-04-06 | Disposition: A | Discharge status: Home / Self Care | Payer: BC Managed Care – PPO | Source: Ambulatory Visit | Attending: Orthopedic Surgery | Admitting: Orthopedic Surgery

## 2021-04-06 ENCOUNTER — Other Ambulatory Visit: Payer: Self-pay | Admitting: Gastroenterology

## 2021-04-06 DIAGNOSIS — Z87891 Personal history of nicotine dependence: Secondary | ICD-10-CM | POA: Insufficient documentation

## 2021-04-06 DIAGNOSIS — Y9389 Activity, other specified: Secondary | ICD-10-CM | POA: Insufficient documentation

## 2021-04-06 DIAGNOSIS — M1990 Unspecified osteoarthritis, unspecified site: Secondary | ICD-10-CM | POA: Diagnosis not present

## 2021-04-06 DIAGNOSIS — S62101D Fracture of unspecified carpal bone, right wrist, subsequent encounter for fracture with routine healing: Secondary | ICD-10-CM

## 2021-04-06 DIAGNOSIS — K5090 Crohn's disease, unspecified, without complications: Secondary | ICD-10-CM | POA: Insufficient documentation

## 2021-04-06 DIAGNOSIS — T8484XA Pain due to internal orthopedic prosthetic devices, implants and grafts, initial encounter: Secondary | ICD-10-CM | POA: Diagnosis not present

## 2021-04-06 DIAGNOSIS — X58XXXA Exposure to other specified factors, initial encounter: Secondary | ICD-10-CM | POA: Diagnosis not present

## 2021-04-06 DIAGNOSIS — K5000 Crohn's disease of small intestine without complications: Secondary | ICD-10-CM

## 2021-04-06 HISTORY — DX: Personal history of other diseases of the musculoskeletal system and connective tissue: Z87.39

## 2021-04-06 HISTORY — PX: HARDWARE REMOVAL: SHX979

## 2021-04-06 SURGERY — REMOVAL, HARDWARE
Anesthesia: Monitor Anesthesia Care | Site: Wrist | Laterality: Right

## 2021-04-06 MED ORDER — OXYCODONE HCL 5 MG PO TABS
ORAL_TABLET | ORAL | Status: AC
  Filled 2021-04-06: qty 1

## 2021-04-06 MED ORDER — BUPIVACAINE HCL (PF) 0.5 % IJ SOLN
INTRAMUSCULAR | Status: DC | PRN
  Administered 2021-04-06: 7.5 mL

## 2021-04-06 MED ORDER — FENTANYL CITRATE (PF) 100 MCG/2ML IJ SOLN
INTRAMUSCULAR | Status: DC | PRN
  Administered 2021-04-06 (×2): 50 ug via INTRAVENOUS

## 2021-04-06 MED ORDER — CEFAZOLIN SODIUM-DEXTROSE 2-4 GM/100ML-% IV SOLN
2.0000 g | INTRAVENOUS | Status: AC
  Administered 2021-04-06: 2 g via INTRAVENOUS

## 2021-04-06 MED ORDER — ONDANSETRON HCL 4 MG/2ML IJ SOLN
INTRAMUSCULAR | Status: DC | PRN
  Administered 2021-04-06: 4 mg via INTRAVENOUS

## 2021-04-06 MED ORDER — FENTANYL CITRATE (PF) 100 MCG/2ML IJ SOLN
INTRAMUSCULAR | Status: AC
  Filled 2021-04-06: qty 2

## 2021-04-06 MED ORDER — ACETAMINOPHEN 500 MG PO TABS
ORAL_TABLET | ORAL | Status: AC
  Filled 2021-04-06: qty 2

## 2021-04-06 MED ORDER — MIDAZOLAM HCL 2 MG/2ML IJ SOLN
INTRAMUSCULAR | Status: DC | PRN
  Administered 2021-04-06: 1 mg via INTRAVENOUS

## 2021-04-06 MED ORDER — FENTANYL CITRATE (PF) 100 MCG/2ML IJ SOLN: 25.0000 ug | INTRAMUSCULAR | Status: DC | PRN

## 2021-04-06 MED ORDER — LIDOCAINE HCL (PF) 1 % IJ SOLN
INTRAMUSCULAR | Status: DC | PRN
  Administered 2021-04-06: 7.5 mL

## 2021-04-06 MED ORDER — MIDAZOLAM HCL 2 MG/2ML IJ SOLN
INTRAMUSCULAR | Status: AC
  Filled 2021-04-06: qty 2

## 2021-04-06 MED ORDER — OXYCODONE-ACETAMINOPHEN 5-325 MG PO TABS: 1.0000 | ORAL_TABLET | Freq: Four times a day (QID) | ORAL | 0 refills | Status: AC | PRN

## 2021-04-06 MED ORDER — ACETAMINOPHEN 500 MG PO TABS
1000.0000 mg | ORAL_TABLET | Freq: Once | ORAL | Status: AC
  Administered 2021-04-06: 1000 mg via ORAL

## 2021-04-06 MED ORDER — LACTATED RINGERS IV SOLN: INTRAVENOUS | Status: DC

## 2021-04-06 MED ORDER — DEXAMETHASONE SODIUM PHOSPHATE 10 MG/ML IJ SOLN
INTRAMUSCULAR | Status: DC | PRN
  Administered 2021-04-06: 8 mg via INTRAVENOUS

## 2021-04-06 MED ORDER — OXYCODONE HCL 5 MG PO TABS
5.0000 mg | ORAL_TABLET | Freq: Once | ORAL | Status: AC
  Administered 2021-04-06: 5 mg via ORAL

## 2021-04-06 MED ORDER — CEFAZOLIN SODIUM-DEXTROSE 2-4 GM/100ML-% IV SOLN
INTRAVENOUS | Status: AC
  Filled 2021-04-06: qty 100

## 2021-04-06 MED ORDER — PROPOFOL 10 MG/ML IV BOLUS
INTRAVENOUS | Status: DC | PRN
  Administered 2021-04-06: 40 mg via INTRAVENOUS
  Administered 2021-04-06 (×4): 20 mg via INTRAVENOUS

## 2021-04-06 MED ORDER — PROMETHAZINE HCL 25 MG/ML IJ SOLN: 6.2500 mg | INTRAMUSCULAR | Status: DC | PRN

## 2021-04-06 MED ORDER — PROPOFOL 500 MG/50ML IV EMUL
INTRAVENOUS | Status: DC | PRN
  Administered 2021-04-06: 75 ug/kg/min via INTRAVENOUS

## 2021-04-06 MED ORDER — 0.9 % SODIUM CHLORIDE (POUR BTL) OPTIME
TOPICAL | Status: DC | PRN
  Administered 2021-04-06: 500 mL

## 2021-04-06 SURGICAL SUPPLY — 36 items
BLADE SURG 15 STRL LF DISP TIS (BLADE) ×1 IMPLANT
BLADE SURG 15 STRL SS (BLADE) ×3
BNDG CMPR 9X4 STRL LF SNTH (GAUZE/BANDAGES/DRESSINGS) ×1
BNDG ELASTIC 3X5.8 VLCR STR LF (GAUZE/BANDAGES/DRESSINGS) ×2 IMPLANT
BNDG ELASTIC 4X5.8 VLCR STR LF (GAUZE/BANDAGES/DRESSINGS) ×3 IMPLANT
BNDG ESMARK 4X9 LF (GAUZE/BANDAGES/DRESSINGS) ×3 IMPLANT
CORD BIPOLAR FORCEPS 12FT (ELECTRODE) ×2 IMPLANT
COVER BACK TABLE 60X90IN (DRAPES) ×3 IMPLANT
CUFF TOURN SGL QUICK 24 (TOURNIQUET CUFF) ×3
CUFF TRNQT CYL 24X4X16.5-23 (TOURNIQUET CUFF) ×1 IMPLANT
DRAPE EXTREMITY T 121X128X90 (DISPOSABLE) ×3 IMPLANT
DRAPE OEC MINIVIEW 54X84 (DRAPES) ×2 IMPLANT
DRAPE U-SHAPE 47X51 STRL (DRAPES) ×2 IMPLANT
GAUZE 4X4 16PLY ~~LOC~~+RFID DBL (SPONGE) ×3 IMPLANT
GAUZE SPONGE 4X4 12PLY STRL (GAUZE/BANDAGES/DRESSINGS) ×3 IMPLANT
GAUZE SPONGE 4X4 12PLY STRL LF (GAUZE/BANDAGES/DRESSINGS) ×2 IMPLANT
GAUZE XEROFORM 1X8 LF (GAUZE/BANDAGES/DRESSINGS) ×3 IMPLANT
GLOVE SURG ENC MOIS LTX SZ7.5 (GLOVE) ×3 IMPLANT
GOWN STRL REUS W/ TWL LRG LVL3 (GOWN DISPOSABLE) ×1 IMPLANT
GOWN STRL REUS W/TWL LRG LVL3 (GOWN DISPOSABLE) ×3
HIBICLENS CHG 4% 4OZ (MISCELLANEOUS) ×3 IMPLANT
KIT TURNOVER CYSTO (KITS) ×3 IMPLANT
NDL HYPO 25X1 1.5 SAFETY (NEEDLE) ×1 IMPLANT
NEEDLE HYPO 25X1 1.5 SAFETY (NEEDLE) ×3 IMPLANT
NS IRRIG 500ML POUR BTL (IV SOLUTION) ×3 IMPLANT
PACK BASIN DAY SURGERY FS (CUSTOM PROCEDURE TRAY) ×3 IMPLANT
PAD CAST 4YDX4 CTTN HI CHSV (CAST SUPPLIES) ×2 IMPLANT
PADDING CAST COTTON 4X4 STRL (CAST SUPPLIES) ×6
SPLINT PLASTER CAST XFAST 4X15 (CAST SUPPLIES) IMPLANT
SPLINT PLASTER XTRA FAST SET 4 (CAST SUPPLIES) ×2
SUT ETHILON 4 0 PS 2 18 (SUTURE) ×6 IMPLANT
SYR 10ML LL (SYRINGE) ×3 IMPLANT
SYR BULB EAR ULCER 3OZ GRN STR (SYRINGE) ×3 IMPLANT
TOWEL OR 17X26 10 PK STRL BLUE (TOWEL DISPOSABLE) ×3 IMPLANT
TRAY DSU PREP LF (CUSTOM PROCEDURE TRAY) ×3 IMPLANT
UNDERPAD 30X36 HEAVY ABSORB (UNDERPADS AND DIAPERS) ×3 IMPLANT

## 2021-04-06 NOTE — Transfer of Care (Signed)
Immediate Anesthesia Transfer of Care Note  Patient: Randy Wilson  Procedure(s) Performed: Right wrist removal of hardware (Right: Wrist)  Patient Location: PACU  Anesthesia Type:MAC  Level of Consciousness: awake, alert  and patient cooperative  Airway & Oxygen Therapy: Patient Spontanous Breathing  Post-op Assessment: Report given to RN and Post -op Vital signs reviewed and stable  Post vital signs: Reviewed and stable  Last Vitals:  Vitals Value Taken Time  BP 154/93 04/06/21 1101  Temp 36.3 C 04/06/21 1101  Pulse 51 04/06/21 1104  Resp 9 04/06/21 1104  SpO2 95 % 04/06/21 1104  Vitals shown include unvalidated device data.  Last Pain:  Vitals:   04/06/21 0846  TempSrc: Oral  PainSc: 0-No pain      Patients Stated Pain Goal: 5 (41/28/78 6767)  Complications: No notable events documented.

## 2021-04-06 NOTE — Discharge Instructions (Addendum)
Post Anesthesia Home Care Instructions  Activity: Get plenty of rest for the remainder of the day. A responsible individual must stay with you for 24 hours following the procedure.  For the next 24 hours, DO NOT: -Drive a car -Paediatric nurse -Drink alcoholic beverages -Take any medication unless instructed by your physician -Make any legal decisions or sign important papers.  Meals: Start with liquid foods such as gelatin or soup. Progress to regular foods as tolerated. Avoid greasy, spicy, heavy foods. If nausea and/or vomiting occur, drink only clear liquids until the nausea and/or vomiting subsides. Call your physician if vomiting continues.  Special Instructions/Symptoms: Your throat may feel dry or sore from the anesthesia or the breathing tube placed in your throat during surgery. If this causes discomfort, gargle with warm salt water. The discomfort should disappear within 24 hours.  **No Tylenol or acetaminophen until 2pm today if needed.    Orthopaedic Hand Surgery Discharge Instructions  WEIGHT BEARING STATUS: Non weight bearing on operative extremity  DRESSINGS: Please keep your dressing/splint/cast clean and dry until your follow-up appointment. You may shower by placing a waterproof covering over your dressing/splint/cast. Contact your surgeon if your splint/cast gets wet. It will need to be changed to prevent skin breakdown.  PAIN CONTROL: First line medications for post operative pain control are Tylenol (acetaminophen) and Motrin (ibuprofen) if you are able to take these medications. If you have been prescribed a medication these can be taken as breakthrough pain medications. Please note that some narcotic pain medication have acetaminophen added and you should never consume more than 4,072m of acetaminophen in 24 hour period. Also please note that if you are given Toradol (ketoralac) you should not take similar medications simultaneously such as ibuprofen.    ICE/ELEVATION: Ice and elevate your injured extremity as needed. Avoid direct contact of ice with skin.  HOME MEDICATIONS: No changes have been made to your home medications.  FOLLOW UP: You will be called after surgery with an appointment date and time, however if you have not received a phone call within 3 days please call during regular office hours at [phone] schedule a post operative appointment.  Please Seek Medical Attention if: Call MD for: pain or pressure in chest, jaw, arm, back, neck  Call MD for: temperature greater than 101 F for more than 24 hours  Call MD for: difficulty breathing Call MD for: Incision redness, bleeding, drainage  Call MD for: palpitations or feeling that the heart is racing  Call MD for: increased swelling in arm, leg, ankle, or abdomen  Call MD for: lightheadedness, dizziness, fainting Go to ED or call 911 if: chest pain does not go away after 3 nitroglycerin doses taken 5 min apart  Go to ED or call 911 for: any uncontrolled bleeding  Go to ED or call 911 if: unable to reach physician  Discharge Medications: Allergies as of 04/06/2021       Reactions   Tramadol Itching   Shrimp [shellfish Allergy] Hives        Medication List     TAKE these medications    allopurinol 100 MG tablet Commonly known as: ZYLOPRIM TAKE 1 TABLET BY MOUTH EVERY DAY   cetirizine 10 MG tablet Commonly known as: ZYRTEC Take 1 tablet (10 mg total) by mouth daily. What changed:  when to take this reasons to take this   EpiPen 2-Pak 0.3 mg/0.3 mL Soaj injection Generic drug: EPINEPHrine Inject 0.3 mg into the muscle as needed for anaphylaxis.  Humira Pen 40 MG/0.4ML Pnkt Generic drug: Adalimumab INJECT 1 PEN UNDER THE SKIN EVERY 14 DAYS. What changed: See the new instructions.   ibuprofen 200 MG tablet Commonly known as: ADVIL Take 600 mg by mouth every 6 (six) hours as needed. Takes 3 of 200 mg ibuprofen   oxyCODONE-acetaminophen 5-325 MG  tablet Commonly known as: Percocet Take 1 tablet by mouth every 6 (six) hours as needed for up to 3 days for severe pain.          Izell Buckner, MD Orthopaedic Hand Surgeon [facility] Office number: [phone] [address] [place], [place] [address]

## 2021-04-06 NOTE — Op Note (Signed)
OPERATIVE NOTE  DATE OF PROCEDURE: 04/06/2021  SURGEONS:  Primary: [name], MD  PREOPERATIVE DIAGNOSIS: Right wrist distal radius fracture painful hardware  POSTOPERATIVE DIAGNOSIS: Same  NAME OF PROCEDURE:   Right wrist removal of hardware dorsal spanning plate and screws Right wrist removal of buried k wires x 2 through separate incision Right wrist four view radiographs with intra-operative interpretation  ANESTHESIA: Monitor Anesthesia Care + Local  SKIN PREPARATION: Hibiclens  ESTIMATED BLOOD LOSS: Minimal  IMPLANTS: none  EXPLANTS: dorsal spanning plate and screws, k wires x 2  INDICATIONS:  Randy Wilson is a 55 y.o. male who has the above preoperative diagnosis. The patient has decided to proceed with surgical intervention.  Risks, benefits and alternatives of operative management were discussed including, but not limited to, risks of anesthesia complications, infection, pain, persistent symptoms, stiffness, need for future surgery.  The patient understands, agrees and elects to proceed with surgery.    DESCRIPTION OF PROCEDURE: The patient was met in the pre-operative area and their identity was verified.  The operative location and laterality was also verified and marked.  The patient was brought to the OR and was placed supine on the table.  After repeat patient identification with the operative team anesthesia was provided and the patient was prepped and draped in the usual sterile fashion.  A final timeout was performed verifying the correction patient, procedure, location and laterality.  Preoperative antibiotics were provided and then the right upper extremity was elevated and exsanguinated with an Esmarch.  Tourniquet was inflated to 250 mmHg.  A longitudinal incision was made through the previous sites of the dorsal spanning plate over the second metacarpal and the radial shaft.  Skin and subcutaneous tissues were divided.  Care was taken to protect the extensor tendons.   The plate was identified and was freed from scar tissue.  All screws were removed and using a Freer elevator the plate was removed in its entirety.  The screw holes were then curetted and thoroughly irrigated.  C-arm fluoroscopy 4 views of the right wrist confirm removal of the dorsal spanning plate and screws from the right wrist.  There remained reduction of the right distal radius fracture with the carpus aligned over the distal radius.  At this time attention was turned to removal of the buried wires.  Through separate incision a longitudinal incision was made over the radial styloid of the right wrist.  Skin subcutaneous tissues were divided.  Care was taken to protect the radial sensory nerve.  The buried pins were identified and were removed with a needle driver in their entirety.  A total of 2 pins were removed to the separate incision.  At this time the wound was thoroughly irrigated.  C-arm confirmed complete removal of the 2 deep buried K wires.  At this time attention was turned to thorough irrigation with normal saline and closure with 4-0 horizontal mattress nylon sutures.  Sterile soft bandage was applied followed by a volar slab plaster splint.  The tourniquet was deflated and the fingers were pink and warm and well-perfused.  All counts were correct x2.  The patient tolerated the procedure well was brought to PACU for recovery in stable condition.   Randy Holmes, MD

## 2021-04-06 NOTE — Anesthesia Postprocedure Evaluation (Signed)
Anesthesia Post Note  Patient: Randy Wilson  Procedure(s) Performed: Right wrist removal of hardware (Right: Wrist)     Patient location during evaluation: PACU Anesthesia Type: MAC Level of consciousness: awake and alert, awake and oriented Pain management: pain level controlled Vital Signs Assessment: post-procedure vital signs reviewed and stable Respiratory status: spontaneous breathing, nonlabored ventilation and respiratory function stable Cardiovascular status: stable, blood pressure returned to baseline and bradycardic Postop Assessment: no apparent nausea or vomiting Anesthetic complications: no   No notable events documented.  Last Vitals:  Vitals:   04/06/21 1130 04/06/21 1140  BP: (!) 157/86 (!) 169/91  Pulse: (!) 47 (!) 51  Resp: 10 14  Temp: (!) 36.3 C (!) 36.3 C  SpO2: 95% 98%    Last Pain:  Vitals:   04/06/21 1140  TempSrc:   PainSc: [place]

## 2021-04-06 NOTE — Interval H&P Note (Signed)
History and Physical Interval Note:  04/06/2021 10:09 AM  Randy Wilson  has presented today for surgery, with the diagnosis of Right wrist distal radius fracture painful hardware.  The various methods of treatment have been discussed with the patient and family. After consideration of risks, benefits and other options for treatment, the patient has consented to  Procedure(s) with comments: Right wrist removal of hardware (Right) - with local anesthesia Needs 30 minutes as a surgical intervention.  The patient's history has been reviewed, patient examined, no change in status, stable for surgery.  I have reviewed the patient's chart and labs.  Questions were answered to the patient's satisfaction.     [name]

## 2021-04-06 NOTE — Anesthesia Preprocedure Evaluation (Addendum)
Anesthesia Evaluation  Patient identified by MRN, date of birth, ID band Patient awake    Reviewed: Allergy & Precautions, NPO status , Patient's Chart, lab work & pertinent test results  Airway Mallampati: II  TM Distance: >3 FB Neck ROM: Full    Dental  (+) Dental Advisory Given, Teeth Intact   Pulmonary asthma , former smoker,    Pulmonary exam normal breath sounds clear to auscultation       Cardiovascular negative cardio ROS Normal cardiovascular exam Rhythm:Regular Rate:Normal     Neuro/Psych negative neurological ROS     GI/Hepatic negative GI ROS, Neg liver ROS, Crohn's disease   Endo/Other  negative endocrine ROS  Renal/GU negative Renal ROS     Musculoskeletal  (+) Arthritis , Osteoarthritis,  Right wrist distal radius fracture painful hardware   Abdominal   Peds  Hematology negative hematology ROS (+)   Anesthesia Other Findings Day of surgery medications reviewed with the patient.  Reproductive/Obstetrics                            Anesthesia Physical Anesthesia Plan  ASA: 2  Anesthesia Plan: MAC   Post-op Pain Management:    Induction: Intravenous  PONV Risk Score and Plan: 1 and Propofol infusion, Midazolam, Dexamethasone and Ondansetron  Airway Management Planned: Nasal Cannula and Natural Airway  Additional Equipment:   Intra-op Plan:   Post-operative Plan:   Informed Consent: I have reviewed the patients History and Physical, chart, labs and discussed the procedure including the risks, benefits and alternatives for the proposed anesthesia with the patient or authorized representative who has indicated his/her understanding and acceptance.     Dental advisory given  Plan Discussed with: CRNA and Anesthesiologist  Anesthesia Plan Comments:         Anesthesia Quick Evaluation

## 2021-04-06 NOTE — Progress Notes (Signed)
Explanted were one plate, 7 screws, and 2 wires.

## 2021-04-07 ENCOUNTER — Encounter (HOSPITAL_BASED_OUTPATIENT_CLINIC_OR_DEPARTMENT_OTHER): Payer: Self-pay | Admitting: Orthopedic Surgery

## 2021-05-01 ENCOUNTER — Other Ambulatory Visit: Payer: BC Managed Care – PPO

## 2021-06-05 ENCOUNTER — Encounter: Payer: Self-pay | Admitting: Emergency Medicine

## 2021-06-05 ENCOUNTER — Other Ambulatory Visit: Payer: Self-pay

## 2021-06-05 ENCOUNTER — Ambulatory Visit: Payer: BC Managed Care – PPO | Admitting: Emergency Medicine

## 2021-06-05 VITALS — BP 128/72 | HR 70 | Temp 98.0°F | Ht 74.0 in | Wt 234.0 lb

## 2021-06-05 DIAGNOSIS — Z13220 Encounter for screening for lipoid disorders: Secondary | ICD-10-CM

## 2021-06-05 DIAGNOSIS — Z0000 Encounter for general adult medical examination without abnormal findings: Secondary | ICD-10-CM

## 2021-06-05 DIAGNOSIS — Z13228 Encounter for screening for other metabolic disorders: Secondary | ICD-10-CM | POA: Diagnosis not present

## 2021-06-05 DIAGNOSIS — Z8719 Personal history of other diseases of the digestive system: Secondary | ICD-10-CM

## 2021-06-05 DIAGNOSIS — Z87898 Personal history of other specified conditions: Secondary | ICD-10-CM | POA: Diagnosis not present

## 2021-06-05 DIAGNOSIS — Z125 Encounter for screening for malignant neoplasm of prostate: Secondary | ICD-10-CM | POA: Diagnosis not present

## 2021-06-05 DIAGNOSIS — Z130 Encounter for screening for diseases of the blood and blood-forming organs and certain disorders involving the immune mechanism: Secondary | ICD-10-CM

## 2021-06-05 DIAGNOSIS — Z1329 Encounter for screening for other suspected endocrine disorder: Secondary | ICD-10-CM | POA: Diagnosis not present

## 2021-06-05 LAB — COMPREHENSIVE METABOLIC PANEL
ALT: 32 U/L (ref 0–53)
AST: 31 U/L (ref 0–37)
Albumin: 4.8 g/dL (ref 3.5–5.2)
Alkaline Phosphatase: 62 U/L (ref 39–117)
BUN: 15 mg/dL (ref 6–23)
CO2: 29 mEq/L (ref 19–32)
Calcium: 9.8 mg/dL (ref 8.4–10.5)
Chloride: 103 mEq/L (ref 96–112)
Creatinine, Ser: 1 mg/dL (ref 0.40–1.50)
GFR: 83.3 mL/min (ref >60.00)
Glucose, Bld: 89 mg/dL (ref 70–99)
Potassium: 4.5 mEq/L (ref 3.5–5.1)
Sodium: 140 mEq/L (ref 135–145)
Total Bilirubin: 1 mg/dL (ref 0.2–1.2)
Total Protein: 7.9 g/dL (ref 6.0–8.3)

## 2021-06-05 LAB — CBC WITH DIFFERENTIAL/PLATELET
Basophils Absolute: 0 10*3/uL (ref 0.0–0.1)
Basophils Relative: 0.3 % (ref 0.0–3.0)
Eosinophils Absolute: 0.3 10*3/uL (ref 0.0–0.7)
Eosinophils Relative: 5 % (ref 0.0–5.0)
HCT: 44.9 % (ref 39.0–52.0)
Hemoglobin: 15 g/dL (ref 13.0–17.0)
Lymphocytes Relative: 38 % (ref 12.0–46.0)
Lymphs Abs: 2.4 10*3/uL (ref 0.7–4.0)
MCHC: 33.4 g/dL (ref 30.0–36.0)
MCV: 86.8 fl (ref 78.0–100.0)
Monocytes Absolute: 0.6 10*3/uL (ref 0.1–1.0)
Monocytes Relative: 10.5 % (ref 3.0–12.0)
Neutro Abs: 2.9 10*3/uL (ref 1.4–7.7)
Neutrophils Relative %: 46.2 % (ref 43.0–77.0)
Platelets: 257 10*3/uL (ref 150.0–400.0)
RBC: 5.17 Mil/uL (ref 4.22–5.81)
RDW: 13.4 % (ref 11.5–15.5)
WBC: 6.2 10*3/uL (ref 4.0–10.5)

## 2021-06-05 LAB — LIPID PANEL
Cholesterol: 194 mg/dL (ref 0–200)
HDL: 43.6 mg/dL (ref >39.00)
LDL Cholesterol: 124 mg/dL — ABNORMAL HIGH (ref 0–99)
NonHDL: 150.25
Total CHOL/HDL Ratio: 4
Triglycerides: 130 mg/dL (ref 0.0–149.0)
VLDL: 26 mg/dL (ref 0.0–40.0)

## 2021-06-05 LAB — HEMOGLOBIN A1C: Hgb A1c MFr Bld: 5.6 % (ref 4.6–6.5)

## 2021-06-05 LAB — PSA: PSA: 0.78 ng/mL (ref 0.10–4.00)

## 2021-06-05 MED ORDER — EPINEPHRINE 0.3 MG/0.3ML IJ SOAJ: 0.3000 mg | INTRAMUSCULAR | 3 refills | Status: DC | PRN

## 2021-06-05 NOTE — Progress Notes (Signed)
Randy Wilson 56 y.o.   Chief Complaint  Patient presents with   New Patient (Initial Visit)    Physical, need new script for EPI pen    HISTORY OF PRESENT ILLNESS: This is a 56 y.o. male first visit to this office here to establish care with me. Patient has a history of Crohn's disease.  Sees GI doctor on a regular basis.  Presently on Humira. Ex smoker.  No other chronic significant medical problems. History of prediabetes. Family history significant for strokes.  HPI   Prior to Admission medications   Medication Sig Start Date End Date Taking? Authorizing Provider  allopurinol (ZYLOPRIM) 100 MG tablet TAKE 1 TABLET BY MOUTH EVERY DAY 01/09/21  Yes Maximiano Coss, NP  HUMIRA PEN 40 MG/0.4ML PNKT INJECT 1 PEN UNDER THE SKIN EVERY 14 DAYS. 04/06/21  Yes Danis, Kirke Corin, MD  ibuprofen (ADVIL) 200 MG tablet Take 600 mg by mouth every 6 (six) hours as needed. Takes 3 of 200 mg ibuprofen   Yes [provider]  cetirizine (ZYRTEC) 10 MG tablet Take 1 tablet (10 mg total) by mouth daily. Patient taking differently: Take 10 mg by mouth as needed. 07/09/18   Delia Chimes A, MD  EPINEPHrine 0.3 mg/0.3 mL IJ SOAJ injection Inject 0.3 mg into the muscle as needed for anaphylaxis. 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 danis;  s/p colon resection 11-02-2015,  on humira   History of asthma    child   History of colon polyps    History of COVID-19  05/30/2020   positive result in epicrunny nose/cough x 3 weeks all symptoms resolved   History of gout    last flare up 1 year ago per pt on 04-03-2021   OA (osteoarthritis)    Right wrist fracture    fell off ladder per pt on 02-08-2021   Right wrist fracture, closed, initial encounter 01/22/2021    Past Surgical History:  Procedure Laterality Date   ANKLE SURGERY Left 2002   bone spurs removed   COLONOSCOPY     last one 02-25-2020  by dr h. danis   HARDWARE REMOVAL Right 04/06/2021   Procedure: Right wrist removal of hardware;  Surgeon: [name], MD;  Location: [facility];  Service: Orthopedics;  Laterality: Right;  with local anesthesia Needs 30 minutes   LAPAROSCOPIC ILEOCECECTOMY N/A 11/02/2015   Procedure: LAPAROSCOPIC ILEOCECECTOMY;  Surgeon: Leighton Ruff, MD;  Location: [facility];  Service: General;  Laterality: N/A;   ORIF WRIST FRACTURE Right 02/09/2021   Procedure: OPEN REDUCTION INTERNAL FIXATION (ORIF) WRIST FRACTURE;  Surgeon: [name], MD;  Location: [place];  Service: Orthopedics;  Laterality: Right;  WITH REGIONAL   SHOULDER ARTHROSCOPY W/ SUBACROMIAL DECOMPRESSION AND DISTAL CLAVICLE EXCISION Left 05/07/2008   @[facility]  by dr Berenice Primas;   bone spurs removed    Social History   Socioeconomic History  Marital status: Married    Spouse name: Kristeen Miss    Number of children: Not on file   Years of education: Not on file   Highest education level: High school graduate  Occupational History   Occupation: Recieving Clerk    Comment: [employer]  Tobacco Use   Smoking status: Former    Packs/day: 1.00    Years: 25.00    Pack years: 25.00    Types: Cigarettes    Quit date: 01/21/2006    Years since quitting: 15.3   Smokeless tobacco: Never  Vaping Use   Vaping Use: Never used  Substance and Sexual Activity   Alcohol use: Yes    Alcohol/week: 6.0 standard drinks    Types: 6 Cans of beer per week    Comment: OCC beer   Drug use:  Not Currently   Sexual activity: Yes    Comment: with monogamous wife, she takes OCP  Other Topics Concern   Not on file  Social History Narrative   Pt is from [place].    Social Determinants of Health   Financial Resource Strain: Not on file  Food Insecurity: Not on file  Transportation Needs: Not on file  Physical Activity: Not on file  Stress: Not on file  Social Connections: Not on file  Intimate Partner Violence: Not on file    Family History  Problem Relation Age of Onset   Asthma Mother 11       asthma attack    Stroke Brother        Myocardial infarction   Stroke Brother    Asthma Sister    Colon cancer Neg Hx    Stomach cancer Neg Hx    Rectal cancer Neg Hx    Esophageal cancer Neg Hx    Colon polyps Neg Hx    Allergic rhinitis Neg Hx    Angioedema Neg Hx    Eczema Neg Hx    Urticaria Neg Hx    Liver cancer Neg Hx    Pancreatic cancer Neg Hx      Review of Systems  Constitutional: Negative.  Negative for chills and fever.  HENT: Negative.  Negative for congestion and sore throat.   Eyes: Negative.   Respiratory: Negative.  Negative for cough and shortness of breath.   Cardiovascular: Negative.  Negative for chest pain and palpitations.  Gastrointestinal: Negative.  Negative for abdominal pain, blood in stool, diarrhea, melena, nausea and vomiting.  Genitourinary: Negative.  Negative for dysuria.  Musculoskeletal:  Positive for joint pain (Left knee pain).  Skin: Negative.  Negative for rash.  Neurological: Negative.  Negative for dizziness and headaches.  All other systems reviewed and are negative.  Today's Vitals   06/05/21 1020  BP: 128/72  Pulse: 70  Temp: 98 F (36.7 C)  TempSrc: Oral  SpO2: 96%  Weight: 234 lb (106.1 kg)  Height: 6' 2"  (1.88 m)   Body mass index is 30.04 kg/m.  Physical Exam Vitals reviewed.  Constitutional:      Appearance: Normal appearance.  HENT:     Head: Normocephalic.     Mouth/Throat:     Mouth:  Mucous membranes are moist.     Pharynx: Oropharynx is clear.  Eyes:     Extraocular Movements: Extraocular movements intact.     Conjunctiva/sclera: Conjunctivae normal.     Pupils: Pupils are equal, round, and reactive to light.  Neck:     Vascular: No carotid bruit.  Cardiovascular:     Rate and  Rhythm: Normal rate and regular rhythm.     Pulses: Normal pulses.     Heart sounds: Normal heart sounds.  Pulmonary:     Effort: Pulmonary effort is normal.     Breath sounds: Normal breath sounds.  Abdominal:     General: There is no distension.     Palpations: Abdomen is soft. There is no mass.     Tenderness: There is no abdominal tenderness.  Musculoskeletal:        General: Normal range of motion.     Cervical back: Normal range of motion and neck supple.     Right lower leg: No edema.     Left lower leg: No edema.  Lymphadenopathy:     Cervical: No cervical adenopathy.  Skin:    Capillary Refill: Capillary refill takes less than 2 seconds.  Neurological:     General: No focal deficit present.     Mental Status: He is alert and oriented to person, place, and time.  Psychiatric:        Mood and Affect: Mood normal.        Behavior: Behavior normal.     ASSESSMENT & PLAN: Problem List Items Addressed This Visit   None Visit Diagnoses     Routine general medical examination at a health care facility    -  Primary   History of Crohn's disease       Relevant Orders   CBC with Differential   History of prediabetes       Relevant Orders   Hemoglobin A1c   Prostate cancer screening       Relevant Orders   PSA(Must document that pt has been informed of limitations of PSA testing.)   Screening for deficiency anemia       Relevant Orders   CBC with Differential   Screening for lipoid disorders       Relevant Orders   Lipid panel   Screening for endocrine, metabolic and immunity disorder       Relevant Orders   Comprehensive metabolic panel      Patient  Instructions  Health Maintenance, Male Adopting a healthy lifestyle and getting preventive care are important in promoting health and wellness. Ask your health care provider about: The right schedule for you to have regular tests and exams. Things you can do on your own to prevent diseases and keep yourself healthy. What should I know about diet, weight, and exercise? Eat a healthy diet  Eat a diet that includes plenty of vegetables, fruits, low-fat dairy products, and lean protein. Do not eat a lot of foods that are high in solid fats, added sugars, or sodium. Maintain a healthy weight Body mass index (BMI) is a measurement that can be used to identify possible weight problems. It estimates body fat based on height and weight. Your health care provider can help determine your BMI and help you achieve or maintain a healthy weight. Get regular exercise Get regular exercise. This is one of the most important things you can do for your health. Most adults should: Exercise for at least 150 minutes each week. The exercise should increase your heart rate and make you sweat (moderate-intensity exercise). Do strengthening exercises at least twice a week. This is in addition to the moderate-intensity exercise. Spend less time sitting. Even light physical activity can be beneficial. Watch cholesterol and blood lipids Have your blood tested for lipids and cholesterol at 58 years of age, then have this test every 5  years. You may need to have your cholesterol levels checked more often if: Your lipid or cholesterol levels are high. You are older than 58 years of age. You are at high risk for heart disease. What should I know about cancer screening? Many types of cancers can be detected early and may often be prevented. Depending on your health history and family history, you may need to have cancer screening at various ages. This may include screening for: Colorectal cancer. Prostate cancer. Skin  cancer. Lung cancer. What should I know about heart disease, diabetes, and high blood pressure? Blood pressure and heart disease High blood pressure causes heart disease and increases the risk of stroke. This is more likely to develop in people who have high blood pressure readings or are overweight. Talk with your health care provider about your target blood pressure readings. Have your blood pressure checked: Every 3-5 years if you are 52-64 years of age. Every year if you are 54 years old or older. If you are between the ages of 53 and 88 and are a current or former smoker, ask your health care provider if you should have a one-time screening for abdominal aortic aneurysm (AAA). Diabetes Have regular diabetes screenings. This checks your fasting blood sugar level. Have the screening done: Once every three years after age 87 if you are at a normal weight and have a low risk for diabetes. More often and at a younger age if you are overweight or have a high risk for diabetes. What should I know about preventing infection? Hepatitis B If you have a higher risk for hepatitis B, you should be screened for this virus. Talk with your health care provider to find out if you are at risk for hepatitis B infection. Hepatitis C Blood testing is recommended for: Everyone born from [birth year] through 1966-03-30. Anyone with known risk factors for hepatitis C. Sexually transmitted infections (STIs) You should be screened each year for STIs, including gonorrhea and chlamydia, if: You are sexually active and are younger than 58 years of age. You are older than 58 years of age and your health care provider tells you that you are at risk for this type of infection. Your sexual activity has changed since you were last screened, and you are at increased risk for chlamydia or gonorrhea. Ask your health care provider if you are at risk. Ask your health care provider about whether you are at high risk for HIV. Your health  care provider may recommend a prescription medicine to help prevent HIV infection. If you choose to take medicine to prevent HIV, you should first get tested for HIV. You should then be tested every 3 months for as long as you are taking the medicine. Follow these instructions at home: Alcohol use Do not drink alcohol if your health care provider tells you not to drink. If you drink alcohol: Limit how much you have to 0-2 drinks a day. Know how much alcohol is in your drink. In the [place] one drink equals one 12 oz bottle of beer (355 mL), one 5 oz glass of wine (148 mL), or one 1 oz glass of hard liquor (44 mL). Lifestyle Do not use any products that contain nicotine or tobacco. These products include cigarettes, chewing tobacco, and vaping devices, such as e-cigarettes. If you need help quitting, ask your health care provider. Do not use street drugs. Do not share needles. Ask your health care provider for help if you need support or information  about quitting drugs. General instructions Schedule regular health, dental, and eye exams. Stay current with your vaccines. Tell your health care provider if: You often feel depressed. You have ever been abused or do not feel safe at home. Summary Adopting a healthy lifestyle and getting preventive care are important in promoting health and wellness. Follow your health care provider's instructions about healthy diet, exercising, and getting tested or screened for diseases. Follow your health care provider's instructions on monitoring your cholesterol and blood pressure. This information is not intended to replace advice given to you by your health care provider. Make sure you discuss any questions you have with your health care provider. Document Revised: 10/03/2020 Document Reviewed: 10/03/2020 Elsevier Patient Education  2022 Evans, MD [facility]

## 2021-06-05 NOTE — Patient Instructions (Signed)

## 2021-06-15 NOTE — Progress Notes (Signed)
Established Patient Office Visit  Subjective:  Patient ID: Randy Wilson, male    DOB: Sep 03, 1965  Age: 56 y.o. MRN: [id number]  CC:  Chief Complaint  Patient presents with   Labs Only    Patient states he would like to get his labs done just to check his liver.    HPI Randy Wilson presents for recheck labs  LFTs borderline in previous visits Discussed possible etiologies Does not want to have liver failure, hesitant to take medication Has been trying to stay conscious of his diet and stay active.  Does need refill on tadalafil Uses some days Good effect, no AE. Hopes to continue   No other concerns.   Past Medical History:  Diagnosis Date   Crohn's disease of small intestine ([place]) 2017   followed by dr h. danis;  s/p colon resection 11-02-2015,  on humira   History of asthma    child   History of colon polyps    History of COVID-19 05/30/2020   positive result in epicrunny nose/cough x 3 weeks all symptoms resolved   History of gout    last flare up 1 year ago per pt on 04-03-2021   OA (osteoarthritis)    Right wrist fracture    fell off ladder per pt on 02-08-2021   Right wrist fracture, closed, initial encounter 01/22/2021    Past Surgical History:  Procedure Laterality Date   ANKLE SURGERY Left 2002   bone spurs removed   COLONOSCOPY     last one 02-25-2020  by dr h. danis   HARDWARE REMOVAL Right 04/06/2021   Procedure: Right wrist removal of hardware;  Surgeon: [name], MD;  Location: [facility];  Service: Orthopedics;  Laterality: Right;  with local anesthesia Needs 30 minutes   LAPAROSCOPIC ILEOCECECTOMY N/A 11/02/2015   Procedure: LAPAROSCOPIC ILEOCECECTOMY;  Surgeon: Leighton Ruff, MD;  Location: [facility];  Service: General;  Laterality: N/A;   ORIF WRIST FRACTURE Right 02/09/2021   Procedure: OPEN REDUCTION INTERNAL FIXATION (ORIF) WRIST FRACTURE;  Surgeon: [name], MD;  Location: [place];  Service:  Orthopedics;  Laterality: Right;  WITH REGIONAL   SHOULDER ARTHROSCOPY W/ SUBACROMIAL DECOMPRESSION AND DISTAL CLAVICLE EXCISION Left 05/07/2008   @[facility]  by dr Berenice Primas;   bone spurs removed    Family History  Problem Relation Age of Onset   Asthma Mother 74       asthma attack    Stroke Brother        Myocardial infarction   Stroke Brother    Asthma Sister    Colon cancer Neg Hx    Stomach cancer Neg Hx    Rectal cancer Neg Hx    Esophageal cancer Neg Hx    Colon polyps Neg Hx    Allergic rhinitis Neg Hx    Angioedema Neg Hx    Eczema Neg Hx    Urticaria Neg Hx    Liver cancer Neg Hx    Pancreatic cancer Neg Hx     Social History   Socioeconomic History   Marital status: Married    Spouse name: Kristeen Miss    Number of children: Not on file   Years of education: Not on file   Highest education level: High school graduate  Occupational History   Occupation: Recieving Scientist, clinical (histocompatibility and immunogenetics)    Comment: [employer]  Tobacco Use   Smoking status: Former    Packs/day: 1.00    Years: 25.00    Pack years: 25.00  Types: Cigarettes    Quit date: 01/21/2006    Years since quitting: 15.4   Smokeless tobacco: Never  Vaping Use   Vaping Use: Never used  Substance and Sexual Activity   Alcohol use: Yes    Alcohol/week: 6.0 standard drinks    Types: 6 Cans of beer per week    Comment: OCC beer   Drug use: Not Currently   Sexual activity: Yes    Comment: with monogamous wife, she takes OCP  Other Topics Concern   Not on file  Social History Narrative   Pt is from [place].    Social Determinants of Health   Financial Resource Strain: Not on file  Food Insecurity: Not on file  Transportation Needs: Not on file  Physical Activity: Not on file  Stress: Not on file  Social Connections: Not on file  Intimate Partner Violence: Not on file    Outpatient Medications Prior to Visit  Medication Sig Dispense Refill   cetirizine (ZYRTEC) 10 MG tablet Take 1 tablet (10 mg total) by mouth daily.  (Patient taking differently: Take 10 mg by mouth as needed.) 30 tablet 11   allopurinol (ZYLOPRIM) 100 MG tablet Take 1 tablet (100 mg total) by mouth daily. 90 tablet 3   Adalimumab (HUMIRA PEN) 40 MG/0.4ML PNKT Inject 40 mg into the skin every 14 (fourteen) days. (Patient not taking: No sig reported) 2 each 6   Adalimumab (HUMIRA PEN-CD/UC/HS STARTER) 40 MG/0.8ML PNKT Inject 160 mg into the skin on Day 1. Inject 80 mg into the skin on Day 15. (Patient not taking: No sig reported) 6 each 0   lidocaine (XYLOCAINE) 5 % ointment Apply 1 application topically as needed. (Patient not taking: Reported on 07/15/2020) 35.44 g 0   No facility-administered medications prior to visit.    Allergies  Allergen Reactions   Tramadol Itching   Shrimp [Shellfish Allergy] Hives    ROS Review of Systems  Constitutional: Negative.   HENT: Negative.    Eyes: Negative.   Respiratory: Negative.    Cardiovascular: Negative.   Gastrointestinal: Negative.   Genitourinary: Negative.   Musculoskeletal: Negative.   Skin: Negative.   Neurological: Negative.   Psychiatric/Behavioral: Negative.    All other systems reviewed and are negative.    Objective:    Physical Exam Constitutional:      General: He is not in acute distress.    Appearance: Normal appearance. He is normal weight. He is not ill-appearing, toxic-appearing or diaphoretic.  Cardiovascular:     Rate and Rhythm: Normal rate and regular rhythm.     Heart sounds: Normal heart sounds. No murmur heard.   No friction rub. No gallop.  Pulmonary:     Effort: Pulmonary effort is normal. No respiratory distress.     Breath sounds: Normal breath sounds. No stridor. No wheezing, rhonchi or rales.  Chest:     Chest wall: No tenderness.  Abdominal:     General: Abdomen is flat. Bowel sounds are normal. There is no distension.     Palpations: There is no mass.     Tenderness: There is no abdominal tenderness.  Neurological:     General: No focal  deficit present.     Mental Status: He is alert and oriented to person, place, and time. Mental status is at baseline.  Psychiatric:        Mood and Affect: Mood normal.        Behavior: Behavior normal.  Thought Content: Thought content normal.        Judgment: Judgment normal.    BP 128/80    Pulse 61    Temp 98 F (36.7 C) (Temporal)    Resp 18    Ht 6' 2"  (1.88 m)    Wt 236 lb 3.2 oz (107.1 kg)    SpO2 96%    BMI 30.33 kg/m  Wt Readings from Last 3 Encounters:  06/05/21 234 lb (106.1 kg)  04/06/21 236 lb 1.6 oz (107.1 kg)  02/09/21 235 lb 14.4 oz (107 kg)     Health Maintenance Due  Topic Date Due   COLONOSCOPY (Pts 45-93yr Insurance coverage will need to be confirmed)  02/24/2021    There are no preventive care reminders to display for this patient.  Lab Results  Component Value Date   TSH 1.830 10/24/2017   Lab Results  Component Value Date   WBC 6.2 06/05/2021   HGB 15.0 06/05/2021   HCT 44.9 06/05/2021   MCV 86.8 06/05/2021   PLT 257.0 06/05/2021   Lab Results  Component Value Date   NA 140 06/05/2021   K 4.5 06/05/2021   CO2 29 06/05/2021   GLUCOSE 89 06/05/2021   BUN 15 06/05/2021   CREATININE 1.00 06/05/2021   BILITOT 1.0 06/05/2021   ALKPHOS 62 06/05/2021   AST 31 06/05/2021   ALT 32 06/05/2021   PROT 7.9 06/05/2021   ALBUMIN 4.8 06/05/2021   CALCIUM 9.8 06/05/2021   ANIONGAP 7 11/05/2015   GFR 83.30 06/05/2021   Lab Results  Component Value Date   CHOL 194 06/05/2021   Lab Results  Component Value Date   HDL 43.60 06/05/2021   Lab Results  Component Value Date   LDLCALC 124 (H) 06/05/2021   Lab Results  Component Value Date   TRIG 130.0 06/05/2021   Lab Results  Component Value Date   CHOLHDL 4 06/05/2021   Lab Results  Component Value Date   HGBA1C 5.6 06/05/2021      Assessment & Plan:   Problem List Items Addressed This Visit       Other   Erectile dysfunction   Other Visit Diagnoses     Heavy alcohol  consumption    -  Primary   Relevant Orders   Hepatic Function Panel (Completed)   Amylase (Completed)   Lipase (Completed)   Hemoglobin A1c (Completed)       Meds ordered this encounter  Medications   DISCONTD: tadalafil (CIALIS) 20 MG tablet    Sig: Take 0.5 tablets (10 mg total) by mouth daily as needed for erectile dysfunction.    Dispense:  30 tablet    Refill:  0    Order Specific Question:   Supervising Provider    Answer:   GCarlota Raspberry JEFFREY R [[id number]]    Follow-up: No follow-ups on file.   PLAN Labs as above Refill tadalafil Follow up as warranted. Consider abd uKoreaif labs abnormal Patient encouraged to call clinic with any questions, comments, or concerns.  RMaximiano Coss NP

## 2021-06-19 ENCOUNTER — Ambulatory Visit: Payer: BC Managed Care – PPO | Admitting: Emergency Medicine

## 2021-09-25 ENCOUNTER — Other Ambulatory Visit: Payer: Self-pay | Admitting: Gastroenterology

## 2021-09-25 DIAGNOSIS — K5000 Crohn's disease of small intestine without complications: Secondary | ICD-10-CM

## 2021-09-26 NOTE — Telephone Encounter (Signed)
Randy Wilson was supposed to see me about 6 months ago for Crohn's follow up. ? ?Please contact him and set an appointment with me within 4-6 weeks. ? ?Then refill the Humira for 2 pens with 1 refill ? ?- HD ?

## 2021-10-04 ENCOUNTER — Telehealth: Payer: Self-pay

## 2021-10-04 NOTE — Telephone Encounter (Signed)
Per Dr Loletha Carrow, I have mailed a letter to the patients home address on file.  ? ? ?Doran Stabler, MD  Elias Else, CMA ?Please mail him a letter and tell him that we have been trying to reach him.  If he wants to continue treatment and care at this office, he needs to contact us immediately to schedule an appointment.  ? ?We will get further Humira refills, I will decline them saying that an appointment is needed.  ? ?Thanks for the efforts.  ? ?HD   ?  ?   ?Previous Messages ?  ?----- Message -----  ?From: Elias Else, CMA  ?Sent: 10/03/2021   4:33 PM EDT  ?To: Doran Stabler, MD  ?Subject: FW: Needs office appointment                  ? ?I have called him daily for a week now,he needs his 6 month follow up. I have sent him a mychart and had no response.  ?Please advise.  ? ?Thank you,  ? ?Vivien Rota  ?

## 2021-10-19 ENCOUNTER — Telehealth: Payer: Self-pay | Admitting: Emergency Medicine

## 2021-11-10 ENCOUNTER — Ambulatory Visit: Payer: BC Managed Care – PPO | Admitting: Gastroenterology

## 2021-11-13 ENCOUNTER — Encounter: Payer: Self-pay | Admitting: Gastroenterology

## 2021-11-23 ENCOUNTER — Other Ambulatory Visit: Payer: Self-pay | Admitting: Gastroenterology

## 2021-11-23 DIAGNOSIS — K5000 Crohn's disease of small intestine without complications: Secondary | ICD-10-CM

## 2021-12-11 ENCOUNTER — Telehealth: Payer: Self-pay | Admitting: Gastroenterology

## 2021-12-11 ENCOUNTER — Other Ambulatory Visit: Payer: Self-pay | Admitting: Gastroenterology

## 2021-12-11 DIAGNOSIS — K5000 Crohn's disease of small intestine without complications: Secondary | ICD-10-CM

## 2021-12-11 NOTE — Telephone Encounter (Signed)
Please send a prescription for two of the 40 mg Humira pens, which is the same as he has been taking. That will get him to his  appointment with me next month, an appointment that he must attend if he wishes to continue care at our office (see my recent letter).  - HD

## 2021-12-11 NOTE — Telephone Encounter (Signed)
Patient needs a refill of Humira sent to [facility] mail order pharmacy.  Thank you.

## 2021-12-11 NOTE — Telephone Encounter (Signed)
Pateint is now scheduled for a follow up for an appointment for 01-04-2022. Please advise on refill request.

## 2021-12-12 NOTE — Telephone Encounter (Signed)
Refilled as directed.

## 2021-12-13 ENCOUNTER — Telehealth: Payer: Self-pay | Admitting: Gastroenterology

## 2021-12-13 NOTE — Telephone Encounter (Signed)
Inbound call from patient stating that he is scheduled to see Dr. Loletha Carrow on 8/10 and is not sure if he can wait that long for an appointment. Patient stated that he is not able to keep food down and is having abd pain. Patient is seeking advice if there is anything he can do in the meantime. Please advise.

## 2021-12-13 NOTE — Telephone Encounter (Signed)
Dr. Loletha Carrow does not have any sooner appts. I returned call to patient. I left him a detailed vm letting him know that if he is not able to keep any solid food or liquids down he would need to go to the ED for evaluation. I told him that he has Dr. Loletha Carrow' next available appt. I told him that we could get him scheduled with an APP on 12/20/21 at 1:30, but he would still need to go to the ED if he can't even keep liquids down. I told pt to give me a call back with any questions.

## 2021-12-19 ENCOUNTER — Other Ambulatory Visit: Payer: Self-pay | Admitting: Gastroenterology

## 2021-12-19 DIAGNOSIS — K5000 Crohn's disease of small intestine without complications: Secondary | ICD-10-CM

## 2021-12-24 ENCOUNTER — Other Ambulatory Visit: Payer: Self-pay | Admitting: Registered Nurse

## 2021-12-24 DIAGNOSIS — Z8739 Personal history of other diseases of the musculoskeletal system and connective tissue: Secondary | ICD-10-CM

## 2021-12-27 ENCOUNTER — Ambulatory Visit (INDEPENDENT_AMBULATORY_CARE_PROVIDER_SITE_OTHER): Payer: BC Managed Care – PPO | Admitting: Registered Nurse

## 2021-12-27 ENCOUNTER — Other Ambulatory Visit: Payer: Self-pay

## 2021-12-27 ENCOUNTER — Encounter: Payer: Self-pay | Admitting: Emergency Medicine

## 2021-12-27 ENCOUNTER — Encounter: Payer: Self-pay | Admitting: Registered Nurse

## 2021-12-27 VITALS — BP 130/84 | HR 65 | Temp 98.0°F | Resp 18 | Ht 74.0 in | Wt 233.0 lb

## 2021-12-27 DIAGNOSIS — R7303 Prediabetes: Secondary | ICD-10-CM

## 2021-12-27 DIAGNOSIS — Z8639 Personal history of other endocrine, nutritional and metabolic disease: Secondary | ICD-10-CM | POA: Diagnosis not present

## 2021-12-27 DIAGNOSIS — Z0000 Encounter for general adult medical examination without abnormal findings: Secondary | ICD-10-CM

## 2021-12-27 DIAGNOSIS — Z8739 Personal history of other diseases of the musculoskeletal system and connective tissue: Secondary | ICD-10-CM

## 2021-12-27 DIAGNOSIS — L309 Dermatitis, unspecified: Secondary | ICD-10-CM | POA: Insufficient documentation

## 2021-12-27 DIAGNOSIS — N529 Male erectile dysfunction, unspecified: Secondary | ICD-10-CM

## 2021-12-27 LAB — CBC WITH DIFFERENTIAL/PLATELET
Basophils Absolute: 0 10*3/uL (ref 0.0–0.1)
Basophils Relative: 0.5 % (ref 0.0–3.0)
Eosinophils Absolute: 0.3 10*3/uL (ref 0.0–0.7)
Eosinophils Relative: 4.6 % (ref 0.0–5.0)
HCT: 40.9 % (ref 39.0–52.0)
Hemoglobin: 13.9 g/dL (ref 13.0–17.0)
Lymphocytes Relative: 30.8 % (ref 12.0–46.0)
Lymphs Abs: 1.9 10*3/uL (ref 0.7–4.0)
MCHC: 34.1 g/dL (ref 30.0–36.0)
MCV: 86.2 fl (ref 78.0–100.0)
Monocytes Absolute: 0.6 10*3/uL (ref 0.1–1.0)
Monocytes Relative: 9.4 % (ref 3.0–12.0)
Neutro Abs: 3.4 10*3/uL (ref 1.4–7.7)
Neutrophils Relative %: 54.7 % (ref 43.0–77.0)
Platelets: 247 10*3/uL (ref 150.0–400.0)
RBC: 4.75 Mil/uL (ref 4.22–5.81)
RDW: 13.6 % (ref 11.5–15.5)
WBC: 6.2 10*3/uL (ref 4.0–10.5)

## 2021-12-27 LAB — COMPREHENSIVE METABOLIC PANEL
ALT: 23 U/L (ref 0–53)
AST: 21 U/L (ref 0–37)
Albumin: 4.5 g/dL (ref 3.5–5.2)
Alkaline Phosphatase: 63 U/L (ref 39–117)
BUN: 14 mg/dL (ref 6–23)
CO2: 26 mEq/L (ref 19–32)
Calcium: 9.3 mg/dL (ref 8.4–10.5)
Chloride: 105 mEq/L (ref 96–112)
Creatinine, Ser: 0.87 mg/dL (ref 0.40–1.50)
GFR: 95.13 mL/min (ref >60.00)
Glucose, Bld: 105 mg/dL — ABNORMAL HIGH (ref 70–99)
Potassium: 4.2 mEq/L (ref 3.5–5.1)
Sodium: 139 mEq/L (ref 135–145)
Total Bilirubin: 0.6 mg/dL (ref 0.2–1.2)
Total Protein: 7.4 g/dL (ref 6.0–8.3)

## 2021-12-27 LAB — LIPID PANEL
Cholesterol: 191 mg/dL (ref 0–200)
HDL: 52.9 mg/dL (ref >39.00)
LDL Cholesterol: 114 mg/dL — ABNORMAL HIGH (ref 0–99)
NonHDL: 138.4
Total CHOL/HDL Ratio: 4
Triglycerides: 120 mg/dL (ref 0.0–149.0)
VLDL: 24 mg/dL (ref 0.0–40.0)

## 2021-12-27 LAB — URIC ACID: Uric Acid, Serum: 7.9 mg/dL — ABNORMAL HIGH (ref 4.0–7.8)

## 2021-12-27 LAB — HEMOGLOBIN A1C: Hgb A1c MFr Bld: 5.7 % (ref 4.6–6.5)

## 2021-12-27 LAB — TSH: TSH: 1.39 u[IU]/mL (ref 0.35–5.50)

## 2021-12-27 LAB — PSA: PSA: 0.57 ng/mL (ref 0.10–4.00)

## 2021-12-27 MED ORDER — TADALAFIL 20 MG PO TABS: 10.0000 mg | ORAL_TABLET | ORAL | 11 refills | Status: DC | PRN

## 2021-12-27 MED ORDER — TRIAMCINOLONE ACETONIDE 0.1 % EX CREA: 1.0000 | TOPICAL_CREAM | Freq: Two times a day (BID) | CUTANEOUS | 11 refills | Status: DC

## 2021-12-27 NOTE — Assessment & Plan Note (Signed)
Continue triamcinolone prn

## 2021-12-27 NOTE — Assessment & Plan Note (Signed)
Continue cialis

## 2021-12-27 NOTE — Assessment & Plan Note (Signed)
Labs collected. Will follow up with the patient as warranted.

## 2021-12-27 NOTE — Progress Notes (Signed)
Complete physical exam  Patient: Randy Wilson   DOB: [birth year]   56 y.o. Male  MRN: [id number] Visit Date: 12/27/2021  Subjective:    Chief Complaint  Patient presents with   Annual Exam    Patient states he is here for his annual CPE/.    Randy Wilson is a 56 y.o. male who presents today for a complete physical exam. He reports consuming a general diet.     He generally feels well. He reports sleeping well. He does not have additional problems to discuss today.   Vision:Within the last year Dental:Within Last 6 months STD Screen:No PSA:No  ED Cialis 79m po qd prn Good effect, no AE Hopes to continue  Eczema Worse in summer On arms Uses triamicnolone with good effect, no AE Hopes to continue.  Most recent fall risk assessment:    12/27/2021    1:41 PM  [place]in the past year? 0  Number falls in past yr: 0  Injury with Fall? 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 danis;  s/p colon resection 11-02-2015,  on humira   History of asthma    child   History of colon polyps    History of COVID-19 05/30/2020   positive result in epicrunny nose/cough x 3 weeks all symptoms resolved   History of gout    last flare up 1 year ago per pt on 04-03-2021   OA (osteoarthritis)    Right wrist fracture    fell off ladder per pt on 02-08-2021   Right wrist  fracture, closed, initial encounter 01/22/2021   Past Surgical History:  Procedure Laterality Date   ANKLE SURGERY Left 2002   bone spurs removed   COLONOSCOPY     last one 02-25-2020  by dr h. danis   HARDWARE REMOVAL Right 04/06/2021   Procedure: Right wrist removal of hardware;  Surgeon: SOrene Desanctis MD;  [facility]  Service: Orthopedics;  Laterality: Right;  with local anesthesia Needs 30 minutes   LAPAROSCOPIC ILEOCECECTOMY N/A 11/02/2015   Procedure: LAPAROSCOPIC ILEOCECECTOMY;  Surgeon: ALeighton Ruff MD;  Location: [facility];  Service: General;  Laterality: N/A;   ORIF WRIST FRACTURE Right 02/09/2021   Procedure: OPEN REDUCTION INTERNAL FIXATION (ORIF) WRIST FRACTURE;  Surgeon: SOrene Desanctis MD;  [facility]  Service: Orthopedics;  Laterality: Right;  WITH REGIONAL   SHOULDER ARTHROSCOPY W/ SUBACROMIAL DECOMPRESSION AND DISTAL CLAVICLE EXCISION Left 05/07/2008   @[facility]  by dr gBerenice Primas   bone spurs removed   Social History   Tobacco Use   Smoking status: Former    Packs/day: 1.00    Years: 25.00    Total pack years: 25.00  Types: Cigarettes    Quit date: 01/21/2006    Years since quitting: 15.9   Smokeless tobacco: Never  Vaping Use   Vaping Use: Never used  Substance Use Topics   Alcohol use: Yes    Alcohol/week: 6.0 standard drinks of alcohol    Types: 6 Cans of beer per week    Comment: OCC beer   Drug use: Not Currently   Social History   Socioeconomic History   Marital status: Married    Spouse name: Kristeen Miss    Number of children: Not on file   Years of education: Not on file   Highest education level: High school graduate  Occupational History   Occupation: Recieving Scientist, clinical (histocompatibility and immunogenetics)    Comment: [employer]  Tobacco Use   Smoking status: Former    Packs/day: 1.00    Years: 25.00    Total pack years: 25.00    Types: Cigarettes    Quit date: 01/21/2006    Years since quitting: 15.9   Smokeless tobacco: Never  Vaping  Use   Vaping Use: Never used  Substance and Sexual Activity   Alcohol use: Yes    Alcohol/week: 6.0 standard drinks of alcohol    Types: 6 Cans of beer per week    Comment: OCC beer   Drug use: Not Currently   Sexual activity: Yes    Comment: with monogamous wife, she takes OCP  Other Topics Concern   Not on file  Social History Narrative   Pt is from [place].    Social Determinants of Health   Financial Resource Strain: Low Risk  (10/24/2017)   Overall Financial Resource Strain (CARDIA)    Difficulty of Paying Living Expenses: Not hard at all  Food Insecurity: No Food Insecurity (10/24/2017)   Hunger Vital Sign    Worried About Running Out of Food in the Last Year: Never true    Ran Out of Food in the Last Year: Never true  Transportation Needs: No Transportation Needs (10/24/2017)   PRAPARE - Hydrologist (Medical): No    Lack of Transportation (Non-Medical): No  Physical Activity: Sufficiently Active (10/24/2017)   Exercise Vital Sign    Days of Exercise per Week: 4 days    Minutes of Exercise per Session: 60 min  Stress: No Stress Concern Present (10/24/2017)   [place]    Feeling of Stress : Not at all  Social Connections: Moderately Integrated (10/24/2017)   Social Connection and Isolation Panel [NHANES]    Frequency of Communication with Friends and Family: More than three times a week    Frequency of Social Gatherings with Friends and Family: More than three times a week    Attends Religious Services: 1 to 4 times per year    Active Member of [employer] or Organizations: No    Attends Archivist Meetings: Never    Marital Status: Married  Human resources officer Violence: Not At Risk (10/24/2017)   Humiliation, Afraid, Rape, and Kick questionnaire    Fear of Current or Ex-Partner: No    Emotionally Abused: No    Physically Abused: No    Sexually Abused: No   Family  Status  Relation Name Status   Mother  Deceased   Father  Deceased at age 22       blood clot   Brother  (Not Specified)   Brother  (Not Specified)   Sister  (Not Specified)   Neg Hx  (  Not Specified)   Family History  Problem Relation Age of Onset   Asthma Mother 41       asthma attack    Stroke Brother        Myocardial infarction   Stroke Brother    Asthma Sister    Colon cancer Neg Hx    Stomach cancer Neg Hx    Rectal cancer Neg Hx    Esophageal cancer Neg Hx    Colon polyps Neg Hx    Allergic rhinitis Neg Hx    Angioedema Neg Hx    Eczema Neg Hx    Urticaria Neg Hx    Liver cancer Neg Hx    Pancreatic cancer Neg Hx    Allergies  Allergen Reactions   Tramadol Itching   Shrimp [Shellfish Allergy] Hives     Patient Care Team: Horald Pollen, MD as PCP - General (Internal Medicine)   Medications: Outpatient Medications Prior to Visit  Medication Sig   allopurinol (ZYLOPRIM) 100 MG tablet TAKE 1 TABLET BY MOUTH EVERY DAY   cetirizine (ZYRTEC) 10 MG tablet Take 1 tablet (10 mg total) by mouth daily. (Patient taking differently: Take 10 mg by mouth as needed.)   EPINEPHrine 0.3 mg/0.3 mL IJ SOAJ injection Inject 0.3 mg into the muscle as needed for anaphylaxis.   ibuprofen (ADVIL) 200 MG tablet Take 600 mg by mouth every 6 (six) hours as needed. Takes 3 of 200 mg ibuprofen   HUMIRA PEN 40 MG/0.4ML PNKT INJECT 1 PEN UNDER THE SKIN EVERY 14 DAYS.   No facility-administered medications prior to visit.    Review of Systems  Constitutional: Negative.   HENT: Negative.    Eyes: Negative.   Respiratory: Negative.    Cardiovascular: Negative.   Gastrointestinal: Negative.   Genitourinary: Negative.   Musculoskeletal: Negative.   Skin: Negative.   Neurological: Negative.   Psychiatric/Behavioral: Negative.    All other systems reviewed and are negative.   Last CBC Lab Results  Component Value Date   WBC 6.2 06/05/2021   HGB 15.0 06/05/2021   HCT 44.9  06/05/2021   MCV 86.8 06/05/2021   MCH 29.2 10/24/2017   RDW 13.4 06/05/2021   PLT 257.0 75/02/2584   Last metabolic panel Lab Results  Component Value Date   GLUCOSE 89 06/05/2021   NA 140 06/05/2021   K 4.5 06/05/2021   CL 103 06/05/2021   CO2 29 06/05/2021   BUN 15 06/05/2021   CREATININE 1.00 06/05/2021   GFRNONAA 90 12/07/2019   CALCIUM 9.8 06/05/2021   PROT 7.9 06/05/2021   ALBUMIN 4.8 06/05/2021   LABGLOB 2.6 12/07/2019   AGRATIO 1.7 12/07/2019   BILITOT 1.0 06/05/2021   ALKPHOS 62 06/05/2021   AST 31 06/05/2021   ALT 32 06/05/2021   ANIONGAP 7 11/05/2015   Last lipids Lab Results  Component Value Date   CHOL 194 06/05/2021   HDL 43.60 06/05/2021   LDLCALC 124 (H) 06/05/2021   TRIG 130.0 06/05/2021   CHOLHDL 4 06/05/2021   Last hemoglobin A1c Lab Results  Component Value Date   HGBA1C 5.6 06/05/2021   Last thyroid functions Lab Results  Component Value Date   TSH 1.830 10/24/2017   Last vitamin D No results found for: "25OHVITD2", "25OHVITD3", "VD25OH" Last vitamin B12 and Folate No results found for: "VITAMINB12", "FOLATE"      Objective:     BP 130/84   Pulse 65   Temp 98 F (36.7 C) (Temporal)   Resp  18   Ht 6' 2"  (1.88 m)   Wt 233 lb (105.7 kg)   SpO2 98%   BMI 29.92 kg/m   BP Readings from Last 3 Encounters:  12/27/21 130/84  06/05/21 128/72  04/06/21 (!) 169/91   Wt Readings from Last 3 Encounters:  12/27/21 233 lb (105.7 kg)  06/05/21 234 lb (106.1 kg)  04/06/21 236 lb 1.6 oz (107.1 kg)   SpO2 Readings from Last 3 Encounters:  12/27/21 98%  06/05/21 96%  04/06/21 98%    Physical Exam Vitals and nursing note reviewed.  Constitutional:      General: He is not in acute distress.    Appearance: Normal appearance. He is normal weight. He is not ill-appearing, toxic-appearing or diaphoretic.  HENT:     Head: Normocephalic and atraumatic.     Right Ear: Tympanic membrane, ear canal and external ear normal. There is no  impacted cerumen.     Left Ear: Tympanic membrane, ear canal and external ear normal. There is no impacted cerumen.     Nose: Nose normal. No congestion or rhinorrhea.     Mouth/Throat:     Mouth: Mucous membranes are moist.     Pharynx: Oropharynx is clear. No oropharyngeal exudate or posterior oropharyngeal erythema.  Eyes:     General: No scleral icterus.       Right eye: No discharge.        Left eye: No discharge.     Extraocular Movements: Extraocular movements intact.     Conjunctiva/sclera: Conjunctivae normal.     Pupils: Pupils are equal, round, and reactive to light.  Neck:     Vascular: No carotid bruit.  Cardiovascular:     Rate and Rhythm: Normal rate and regular rhythm.     Pulses: Normal pulses.     Heart sounds: Normal heart sounds. No murmur heard.    No friction rub. No gallop.  Pulmonary:     Effort: Pulmonary effort is normal. No respiratory distress.     Breath sounds: Normal breath sounds. No stridor. No wheezing, rhonchi or rales.  Chest:     Chest wall: No tenderness.  Abdominal:     General: Abdomen is flat. Bowel sounds are normal. There is no distension.     Palpations: Abdomen is soft. There is no mass.     Tenderness: There is no abdominal tenderness. There is no right CVA tenderness, left CVA tenderness, guarding or rebound.     Hernia: No hernia is present.  Musculoskeletal:        General: No swelling, tenderness, deformity or signs of injury. Normal range of motion.     Cervical back: Normal range of motion and neck supple. No rigidity or tenderness.     Right lower leg: No edema.     Left lower leg: No edema.  Lymphadenopathy:     Cervical: No cervical adenopathy.  Skin:    General: Skin is warm and dry.     Capillary Refill: Capillary refill takes less than 2 seconds.     Coloration: Skin is not jaundiced or pale.     Findings: No bruising, erythema, lesion or rash.  Neurological:     General: No focal deficit present.     Mental Status:  He is alert and oriented to person, place, and time. Mental status is at baseline.     Cranial Nerves: No cranial nerve deficit.     Sensory: No sensory deficit.     Motor: No weakness.  Coordination: Coordination normal.     Gait: Gait normal.     Deep Tendon Reflexes: Reflexes normal.  Psychiatric:        Mood and Affect: Mood normal.        Behavior: Behavior normal.        Thought Content: Thought content normal.        Judgment: Judgment normal.      No results found for any visits on 12/27/21.    Assessment & Plan:    Routine Health Maintenance and Physical Exam  Immunization History  Administered Date(s) Administered   Hep A / Hep B 06/03/2018, 07/08/2018   Hepatitis A, Adult 02/11/2020   Influenza,inj,Quad PF,6+ Mos 06/02/2015, 05/02/2016, 03/07/2017, 03/28/2018, 01/20/2020   Influenza,inj,quad, With Preservative 02/26/2019   Influenza-Unspecified 04/12/2021   PFIZER(Purple Top)SARS-COV-2 Vaccination 08/07/2019, 08/31/2019, 04/12/2021   Pneumococcal Conjugate-13 03/28/2018   Pneumococcal Polysaccharide-23 06/03/2018   Tdap 05/02/2016   Zoster Recombinat (Shingrix) 05/03/2020, 02/28/2021    Health Maintenance  Topic Date Due   COLONOSCOPY (Pts 45-63yr Insurance coverage will need to be confirmed)  02/24/2021   COVID-19 Vaccine (4 - Booster for Pfizer series) 06/07/2021   INFLUENZA VACCINE  12/26/2021   TETANUS/TDAP  05/02/2026   Hepatitis C Screening  Completed   HIV Screening  Completed   Zoster Vaccines- Shingrix  Completed   HPV VACCINES  Aged Out    Discussed health benefits of physical activity, and encouraged him to engage in regular exercise appropriate for his age and condition.  Problem List Items Addressed This Visit       Musculoskeletal and Integument   Eczema    Continue triamcinolone prn      Relevant Medications   triamcinolone cream (KENALOG) 0.1 %     Other   Prediabetes    Labs collected. Will follow up with the patient as  warranted.       Relevant Orders   CBC with Differential/Platelet   Comprehensive metabolic panel   Hemoglobin A1c   Lipid panel   TSH   PSA   Uric acid   Erectile dysfunction    Continue cialis      Relevant Medications   tadalafil (CIALIS) 20 MG tablet   Other Visit Diagnoses     Annual physical exam    -  Primary   History of elevated glucose       Relevant Orders   CBC with Differential/Platelet   Comprehensive metabolic panel   Hemoglobin A1c   Lipid panel   TSH   PSA   Uric acid   History of gout       Relevant Orders   CBC with Differential/Platelet   Comprehensive metabolic panel   Hemoglobin A1c   Lipid panel   TSH   PSA   Uric acid      Return in about 6 months (around 06/29/2022) for Chronic Conditions.     PLAN Exam unremarkable Labs collected. Will follow up with the patient as warranted. Patient encouraged to call clinic with any questions, comments, or concerns.   RMaximiano Coss NP

## 2021-12-27 NOTE — Patient Instructions (Addendum)
Mr. Randy Wilson to see you!  Stay well. It's been great to take part in your care.   I recommend these providers:  Inda Coke, PA Dimas Chyle, MD Agustina Caroli, MD Myrna Blazer Early, NP Jeralyn Ruths, DNP Berniece Pap, MD Grier Mitts, MD Alma Friendly, NP Loura Pardon, MD  Best of luck!  Rich   If you have lab work done today you will be contacted with your lab results within the next 2 weeks.  If you have not heard from [place] then please contact us. The fastest way to get your results is to register for My Chart.   IF you received an x-ray today, you will receive an invoice from [facility]. Please contact [facility] at [phone] with questions or concerns regarding your invoice.   IF you received labwork today, you will receive an invoice from [facility]. Please contact [facility] at [phone] with questions or concerns regarding your invoice.   Our billing staff will not be able to assist you with questions regarding bills from these companies.  You will be contacted with the lab results as soon as they are available. The fastest way to get your results is to activate your My Chart account. Instructions are located on the last page of this paperwork. If you have not heard from [place] regarding the results in 2 weeks, please contact this office.

## 2022-01-02 ENCOUNTER — Telehealth: Payer: Self-pay | Admitting: Emergency Medicine

## 2022-01-02 DIAGNOSIS — N529 Male erectile dysfunction, unspecified: Secondary | ICD-10-CM

## 2022-01-02 NOTE — Telephone Encounter (Signed)
Called patient and left message for patient to call office. Patient rx was prescribed by neurologist. Patient needs to contact that office to get that PA started.

## 2022-01-02 NOTE — Telephone Encounter (Signed)
Pt is needing a prior authorization for tadalafil (CIALIS) 20 MG tablet. Pt states Dr. Mitchel Honour advised he started the PA on 12/27/21 after speaking with Nedra Hai but I don't see any record of a PA in the system.  Please advise

## 2022-01-03 ENCOUNTER — Other Ambulatory Visit: Payer: Self-pay | Admitting: Emergency Medicine

## 2022-01-03 DIAGNOSIS — N529 Male erectile dysfunction, unspecified: Secondary | ICD-10-CM

## 2022-01-03 MED ORDER — TADALAFIL 20 MG PO TABS: 10.0000 mg | ORAL_TABLET | ORAL | 11 refills | Status: DC | PRN

## 2022-01-03 NOTE — Telephone Encounter (Signed)
Spoke with pt and he has stated the NP who rx'd the tadalafil (CIALIS) 20 MG tablet is no longer with the practice as he has moved back to [place] and pt was advised to have PCP continue his refills the above medication.

## 2022-01-03 NOTE — Telephone Encounter (Signed)
Okay to refill? 

## 2022-01-03 NOTE — Telephone Encounter (Signed)
PA for Tadalafil submitted, awaiting response Key: [id number]

## 2022-01-04 ENCOUNTER — Ambulatory Visit (INDEPENDENT_AMBULATORY_CARE_PROVIDER_SITE_OTHER): Payer: BC Managed Care – PPO | Admitting: Gastroenterology

## 2022-01-04 ENCOUNTER — Encounter: Payer: Self-pay | Admitting: Gastroenterology

## 2022-01-04 ENCOUNTER — Other Ambulatory Visit: Payer: Self-pay | Admitting: Gastroenterology

## 2022-01-04 VITALS — BP 154/90 | HR 60 | Resp 16 | Ht 74.0 in | Wt 234.0 lb

## 2022-01-04 DIAGNOSIS — K5000 Crohn's disease of small intestine without complications: Secondary | ICD-10-CM

## 2022-01-04 DIAGNOSIS — Z7960 Long term (current) use of unspecified immunomodulators and immunosuppressants: Secondary | ICD-10-CM | POA: Diagnosis not present

## 2022-01-04 MED ORDER — PLENVU 140 G PO SOLR: 140.0000 g | ORAL | 0 refills | Status: DC

## 2022-01-04 NOTE — Progress Notes (Signed)
[facility] Progress Note  Chief Complaint: Ileal Crohn's disease  Subjective  History: Randy Wilson was here for follow-up of his Crohn's disease, with a clinical history outlined in his most recent office note of 01/03/2021. Unfortunately, he has had continued noncompliance with follow-up, either canceled or no-show for multiple appointments.  I mailed him a letter recently insisting he make and keep this appointment will be discharged from the practice.  This has led me not to start azathioprine for him out of concern that he may not be adherent to follow-up visits and blood work.  Of note, he has a low TPMT level. At the last visit we were planning a 55-monthfollow-up with plans for colonoscopy by the end of the year.  He also needed a shingles vaccine and was encouraged to get that done with primary care as soon as possible.  (Today he reports having gotten both of those injections) Last colonoscopy was in September 2021 showing Crohn's recurrence at his anastomosis with plans to begin Humira afterward.  He began Humira in March 2022. ________________________________  Randy Wilson he is generally feeling well.  On a couple occasions he had a loose stool if he was late for his Humira injection, but in general has done well taking it every 2 weeks.  Denies abdominal pain or rectal bleeding, denies nausea or vomiting, dysphagia or heartburn. He saw his primary care provider recently. ROS: Cardiovascular:  no chest pain Respiratory: no dyspnea Dry itchy eyes Remainder systems negative except as above The patient's Past Medical, Family and Social History were reviewed and are on file in the EMR. Past Medical History:  Diagnosis Date   Cro[place]'s disease of [place] intestine [place]   followed by dr h. danis;  s/p colon resection 11-02-2015,  on humira   History of asthma    child   History of colon polyps    History of COVID-19 05/30/2020   positive result in epicrunny nose/cough x 3 weeks  all symptoms resolved   History of gout    last flare up 1 year ago per pt on 04-03-2021   OA (osteoarthritis)    Right wrist fracture    fell off ladder per pt on 02-08-2021   Right wrist fracture, closed, initial encounter 01/22/2021    Objective:  Med list reviewed  Current Outpatient Medications:    allopurinol (ZYLOPRIM) 100 MG tablet, TAKE 1 TABLET BY MOUTH EVERY DAY, Disp: 90 tablet, Rfl: 3   cetirizine (ZYRTEC) 10 MG tablet, Take 1 tablet (10 mg total) by mouth daily. (Patient taking differently: Take 10 mg by mouth as needed.), Disp: 30 tablet, Rfl: 11   EPINEPHrine 0.3 mg/0.3 mL IJ SOAJ injection, Inject 0.3 mg into the muscle as needed for anaphylaxis., Disp: 1 each, Rfl: 3   HUMIRA PEN 40 MG/0.4ML PNKT, INJECT 1 PEN UNDER THE SKIN EVERY 14 DAYS., Disp: 2 each, Rfl: 0   ibuprofen (ADVIL) 200 MG tablet, Take 600 mg by mouth every 6 (six) hours as needed. Takes 3 of 200 mg ibuprofen, Disp: , Rfl:    PEG-KCl-NaCl-NaSulf-Na Asc-C (PLENVU) 140 g SOLR, Take 140 g by mouth as directed., Disp: 1 each, Rfl: 0   tadalafil (CIALIS) 20 MG tablet, TAKE 1/2 TO 1 TABLET (10-20 MG TOTAL) BY MOUTH EVERY OTHER DAY AS NEEDED FOR ERECTILE DYSFUNCTION, Disp: 30 tablet, Rfl: 11   triamcinolone cream (KENALOG) 0.1 %, Apply 1 Application topically 2 (two) times daily., Disp: 30 g, Rfl: 11  Vital signs in last 24 hrs: Vitals:   01/04/22 0837  BP: (!) 154/90  Pulse: 60  Resp: 16  SpO2: 95%   Wt Readings from Last 3 Encounters:  01/04/22 234 lb (106.1 kg)  12/27/21 233 lb (105.7 kg)  06/05/21 234 lb (106.1 kg)    Physical Exam  Well-appearing, normal muscle mass HEENT: sclera anicteric, oral mucosa moist without lesions Neck: supple, no thyromegaly, JVD or lymphadenopathy Cardiac: Regular without murmur,  no peripheral edema Pulm: clear to auscultation bilaterally, normal RR and effort noted Abdomen: soft, no tenderness, with active bowel sounds. No guarding or palpable  hepatosplenomegaly. Skin; warm and dry, no jaundice or rash  Labs:     Latest Ref Rng & Units 12/27/2021    2:17 PM 06/05/2021   11:16 AM 01/03/2021    2:01 PM  CBC  WBC 4.0 - 10.5 K/uL 6.2  6.2  7.0   Hemoglobin 13.0 - 17.0 g/dL 13.9  15.0  14.9   Hematocrit 39.0 - 52.0 % 40.9  44.9  43.9   Platelets 150.0 - 400.0 K/uL 247.0  257.0  249.0       Latest Ref Rng & Units 12/27/2021    2:17 PM 06/05/2021   11:16 AM 01/03/2021    2:01 PM  CMP  Glucose 70 - 99 mg/dL 105  89  92   BUN 6 - 23 mg/dL 14  15  14    Creatinine 0.40 - 1.50 mg/dL 0.87  1.00  1.05   Sodium 135 - 145 mEq/L 139  140  138   Potassium 3.5 - 5.1 mEq/L 4.2  4.5  4.1   Chloride 96 - 112 mEq/L 105  103  103   CO2 19 - 32 mEq/L 26  29  28    Calcium 8.4 - 10.5 mg/dL 9.3  9.8  9.3   Total Protein 6.0 - 8.3 g/dL 7.4  7.9  7.7   Total Bilirubin 0.2 - 1.2 mg/dL 0.6  1.0  0.6   Alkaline Phos 39 - 117 U/L 63  62  69   AST 0 - 37 U/L 21  31  17    ALT 0 - 53 U/L 23  32  15       _____________________________________________ Assessment & Plan  Assessment: Encounter Diagnoses  Name Primary?   Crohn's disease of small intestine without complication ([place]) Yes   Long-term use of immunosuppressant medication     Small bowel Crohn's disease with recurrence after surgery, has now been on Humira 40 mg every other week for about 18 months. He is not on azathioprine for reasons noted above, though I have told him that I am would like him to be in order to have the best chance of keeping his Crohn's under control and decreasing the chance of developing neutralizing antibodies to Humira.  I recommended a colonoscopy and he was agreeable to getting that scheduled today.  The benefits and risks of the planned procedure were described in detail with the patient or (when appropriate) their health care proxy.  Risks were outlined as including, but not limited to, bleeding, infection, perforation, adverse medication reaction leading to cardiac  or pulmonary decompensation, pancreatitis (if ERCP).  The limitation of incomplete mucosal visualization was also discussed.  No guarantees or warranties were given.  Labs to be done 1 to 2 days prior to his upcoming Humira injection: Humira drug and antibody level, TB QuantiFERON gold  Further treatment recommendations to follow pending colonoscopy findings.  30 minutes were spent on this encounter (including chart review, history/exam, counseling/coordination of care, and documentation) > 50% of that time was spent on counseling and coordination of care.   Nelida Meuse III

## 2022-01-04 NOTE — Patient Instructions (Signed)
_______________________________________________________  If you are age 58 or older, your body mass index should be between 23-30. Your Body mass index is 30.04 kg/m. If this is out of the aforementioned range listed, please consider follow up with your Primary Care Provider.  If you are age 13 or younger, your body mass index should be between 19-25. Your Body mass index is 30.04 kg/m. If this is out of the aformentioned range listed, please consider follow up with your Primary Care Provider.   ________________________________________________________  The [facility] providers would like to encourage you to use [facility] to communicate with providers for non-urgent requests or questions.  Due to long hold times on the telephone, sending your provider a message by [facility] may be a faster and more efficient way to get a response.  Please allow 48 business hours for a response.  Please remember that this is for non-urgent requests.  _______________________________________________________  Randy Wilson have been scheduled for a colonoscopy. Please follow written instructions given to you at your visit today.  Please pick up your prep supplies at the pharmacy within the next 1-3 days. If you use inhalers (even only as needed), please bring them with you on the day of your procedure.  Your provider has requested that you go to the basement level for lab work before leaving today. Press "B" on the elevator. The lab is located at the first door on the left as you exit the elevator. PLEASE do LABS on 01-12-2022.   Due to recent changes in healthcare laws, you may see the results of your imaging and laboratory studies on MyChart before your provider has had a chance to review them.  We understand that in some cases there may be results that are confusing or concerning to you. Not all laboratory results come back in the same time frame and the provider may be waiting for multiple results in order to interpret others.   Please give [place] 48 hours in order for your provider to thoroughly review all the results before contacting the office for clarification of your results.   It was a pleasure to see you today!  Thank you for trusting me with your gastrointestinal care!

## 2022-01-07 ENCOUNTER — Other Ambulatory Visit: Payer: Self-pay | Admitting: Gastroenterology

## 2022-01-08 ENCOUNTER — Telehealth: Payer: Self-pay | Admitting: Gastroenterology

## 2022-01-08 NOTE — Telephone Encounter (Signed)
Patient called and needs a refill of Humira as soon as possible as his injection is due this week.  Not sure if it will require prior auth by his insurance or not.  Please call patient and advise.  Thank you.

## 2022-01-09 ENCOUNTER — Other Ambulatory Visit: Payer: Self-pay

## 2022-01-09 DIAGNOSIS — K5000 Crohn's disease of small intestine without complications: Secondary | ICD-10-CM

## 2022-01-09 MED ORDER — HUMIRA (2 PEN) 40 MG/0.4ML ~~LOC~~ AJKT: AUTO-INJECTOR | SUBCUTANEOUS | 0 refills | Status: DC

## 2022-01-09 NOTE — Telephone Encounter (Signed)
Inbound call from rachel at [facility] . Ernst Bowler states the prescription refill for Humira can be faxed to [phone]. If wanting to give a call back to further advise, Please call [phone].  Thank you

## 2022-01-09 NOTE — Telephone Encounter (Signed)
Refilled to specialty pharm as requested

## 2022-01-10 NOTE — Telephone Encounter (Signed)
PA auth for Tadalafil denied. Please advise

## 2022-01-10 NOTE — Telephone Encounter (Signed)
What is available and covered by his insurance?  What are the options?

## 2022-01-11 ENCOUNTER — Other Ambulatory Visit: Payer: Self-pay | Admitting: Emergency Medicine

## 2022-01-11 DIAGNOSIS — N529 Male erectile dysfunction, unspecified: Secondary | ICD-10-CM

## 2022-01-11 MED ORDER — SILDENAFIL CITRATE 100 MG PO TABS: 50.0000 mg | ORAL_TABLET | Freq: Every day | ORAL | 11 refills | Status: DC | PRN

## 2022-01-11 NOTE — Telephone Encounter (Signed)
Called patient and informed him that a new prescription has been sent to his pharmacy

## 2022-01-11 NOTE — Telephone Encounter (Signed)
New prescription for Viagra sent to pharmacy of record.  Thanks

## 2022-01-12 ENCOUNTER — Other Ambulatory Visit: Payer: BC Managed Care – PPO

## 2022-01-12 DIAGNOSIS — K5000 Crohn's disease of small intestine without complications: Secondary | ICD-10-CM

## 2022-01-12 DIAGNOSIS — Z7960 Long term (current) use of unspecified immunomodulators and immunosuppressants: Secondary | ICD-10-CM

## 2022-01-16 LAB — QUANTIFERON-TB GOLD PLUS
Mitogen-NIL: >10 IU/mL
NIL: 0.03 IU/mL
QuantiFERON-TB Gold Plus: NEGATIVE
TB1-NIL: 0 IU/mL
TB2-NIL: 0.01 IU/mL

## 2022-01-21 LAB — SERIAL MONITORING

## 2022-01-22 LAB — ADALIMUMAB+AB (SERIAL MONITOR)
Adalimumab Drug Level: 4.7 ug/mL
Anti-Adalimumab Antibody: <25 ng/mL

## 2022-01-23 ENCOUNTER — Other Ambulatory Visit: Payer: Self-pay | Admitting: Gastroenterology

## 2022-01-23 DIAGNOSIS — K5000 Crohn's disease of small intestine without complications: Secondary | ICD-10-CM

## 2022-01-30 ENCOUNTER — Encounter: Payer: Self-pay | Admitting: Gastroenterology

## 2022-02-06 ENCOUNTER — Encounter: Payer: Self-pay | Admitting: Gastroenterology

## 2022-02-06 ENCOUNTER — Ambulatory Visit (AMBULATORY_SURGERY_CENTER): Payer: BC Managed Care – PPO | Admitting: Gastroenterology

## 2022-02-06 VITALS — BP 104/70 | HR 55 | Temp 97.3°F | Resp 16 | Ht 76.0 in | Wt 234.0 lb

## 2022-02-06 DIAGNOSIS — K5000 Crohn's disease of small intestine without complications: Secondary | ICD-10-CM | POA: Diagnosis present

## 2022-02-06 DIAGNOSIS — K649 Unspecified hemorrhoids: Secondary | ICD-10-CM

## 2022-02-06 MED ORDER — SODIUM CHLORIDE 0.9 % IV SOLN: 500.0000 mL | Freq: Once | INTRAVENOUS | Status: DC

## 2022-02-06 NOTE — Progress Notes (Signed)
Pt's states no medical or surgical changes since previsit or office visit. 

## 2022-02-06 NOTE — Progress Notes (Signed)
History and Physical:  This patient presents for endoscopic testing for: Encounter Diagnosis  Name Primary?   Crohn's disease of small intestine without complication [facility]) Yes    56 year old man with Crohn's disease is here for colonoscopy today for evaluation of disease activity.  Clinical details are outlined in the note from his last office visit with me on 01/04/2022.  There have been no clinical changes since that visit.  Patient is otherwise without complaints or active issues today.   Past Medical History: Past Medical History:  Diagnosis Date   Crohn's disease of small intestine ([place]) 2017   followed by dr h. danis;  s/p colon resection 11-02-2015,  on humira   History of asthma    child   History of colon polyps    History of COVID-19 05/30/2020   positive result in epicrunny nose/cough x 3 weeks all symptoms resolved   History of gout    last flare up 1 year ago per pt on 04-03-2021   OA (osteoarthritis)    Right wrist fracture    fell off ladder per pt on 02-08-2021   Right wrist fracture, closed, initial encounter 01/22/2021     Past Surgical History: Past Surgical History:  Procedure Laterality Date   ANKLE SURGERY Left 2002   bone spurs removed   COLONOSCOPY     last one 02-25-2020  by dr h. danis   HARDWARE REMOVAL Right 04/06/2021   Procedure: Right wrist removal of hardware;  Surgeon: [name], MD;  Location: [facility];  Service: Orthopedics;  Laterality: Right;  with local anesthesia Needs 30 minutes   LAPAROSCOPIC ILEOCECECTOMY N/A 11/02/2015   Procedure: LAPAROSCOPIC ILEOCECECTOMY;  Surgeon: Leighton Ruff, MD;  Location: [facility];  Service: General;  Laterality: N/A;   ORIF WRIST FRACTURE Right 02/09/2021   Procedure: OPEN REDUCTION INTERNAL FIXATION (ORIF) WRIST FRACTURE;  Surgeon: [name], MD;  Location: [facility];  Service: Orthopedics;  Laterality: Right;  WITH REGIONAL   SHOULDER ARTHROSCOPY W/ SUBACROMIAL  DECOMPRESSION AND DISTAL CLAVICLE EXCISION Left 05/07/2008   @[facility]  by dr Berenice Primas;   bone spurs removed    Allergies: Allergies  Allergen Reactions   Tramadol Itching   Shrimp [Shellfish Allergy] Hives    Outpatient Meds: Current Outpatient Medications  Medication Sig Dispense Refill   allopurinol (ZYLOPRIM) 100 MG tablet TAKE 1 TABLET BY MOUTH EVERY DAY 90 tablet 3   cetirizine (ZYRTEC) 10 MG tablet Take 1 tablet (10 mg total) by mouth daily. (Patient taking differently: Take 10 mg by mouth as needed.) 30 tablet 11   EPINEPHrine 0.3 mg/0.3 mL IJ SOAJ injection Inject 0.3 mg into the muscle as needed for anaphylaxis. 1 each 3   fluticasone (FLONASE) 50 MCG/ACT nasal spray      HUMIRA PEN 40 MG/0.4ML PNKT INJECT 1 PEN UNDER THE SKIN EVERY 14 DAYS. 2 each 2   ibuprofen (ADVIL) 200 MG tablet Take 600 mg by mouth every 6 (six) hours as needed. Takes 3 of 200 mg ibuprofen     oxyCODONE-acetaminophen (PERCOCET/ROXICET) 5-325 MG tablet TAKE 1 TABLET BY MOUTH EVERY 6 (SIX) HOURS AS NEEDED FOR UP TO 3 DAYS FOR SEVERE PAIN.     sildenafil (VIAGRA) 100 MG tablet Take 0.5-1 tablets (50-100 mg total) by mouth daily as needed for erectile dysfunction. 5 tablet 11   triamcinolone cream (KENALOG) 0.1 % Apply 1 Application topically 2 (two) times daily. 30 g 11   Current Facility-Administered Medications  Medication Dose Route Frequency  Provider Last Rate Last Admin   0.9 %  sodium chloride infusion  500 mL Intravenous Once Nelida Meuse III, MD          ___________________________________________________________________ Objective   Exam:  BP 136/86   Pulse 60   Temp (!) 97.3 F (36.3 C)   Resp 15   Ht 6' 4"  (1.93 m)   Wt 234 lb (106.1 kg)   SpO2 96%   BMI 28.48 kg/m   CV: RRR without murmur, S1/S2 Resp: clear to auscultation bilaterally, normal RR and effort noted GI: soft, no tenderness, with active bowel sounds.   Assessment: Encounter Diagnosis  Name Primary?   Crohn's  disease of small intestine without complication ([place]) Yes     Plan: Colonoscopy  The benefits and risks of the planned procedure were described in detail with the patient or (when appropriate) their health care proxy.  Risks were outlined as including, but not limited to, bleeding, infection, perforation, adverse medication reaction leading to cardiac or pulmonary decompensation, pancreatitis (if ERCP).  The limitation of incomplete mucosal visualization was also discussed.  No guarantees or warranties were given.    The patient is appropriate for an endoscopic procedure in the ambulatory setting.   - Wilfrid Lund, MD

## 2022-02-06 NOTE — Patient Instructions (Addendum)
Handout on hemorrhoids and diverticulosis.  No recommendation on repeat colonoscopy at this time. Discuss changing treatment to [employer] (rizankizumab)   YOU HAD AN ENDOSCOPIC PROCEDURE TODAY AT [facility]:   Refer to the procedure report that was given to you for any specific questions about what was found during the examination.  If the procedure report does not answer your questions, please call your gastroenterologist to clarify.  If you requested that your care partner not be given the details of your procedure findings, then the procedure report has been included in a sealed envelope for you to review at your convenience later.  YOU SHOULD EXPECT: Some feelings of bloating in the abdomen. Passage of more gas than usual.  Walking can help get rid of the air that was put into your GI tract during the procedure and reduce the bloating. If you had a lower endoscopy (such as a colonoscopy or flexible sigmoidoscopy) you may notice spotting of blood in your stool or on the toilet paper. If you underwent a bowel prep for your procedure, you may not have a normal bowel movement for a few days.  Please Note:  You might notice some irritation and congestion in your nose or some drainage.  This is from the oxygen used during your procedure.  There is no need for concern and it should clear up in a day or so.  SYMPTOMS TO REPORT IMMEDIATELY:  Following lower endoscopy (colonoscopy or flexible sigmoidoscopy):  Excessive amounts of blood in the stool  Significant tenderness or worsening of abdominal pains  Swelling of the abdomen that is new, acute  Fever of 100F or higher  For urgent or emergent issues, a gastroenterologist can be reached at any hour by calling [phone]. Do not use MyChart messaging for urgent concerns.    DIET:  We do recommend a small meal at first, but then you may proceed to your regular diet.  Drink plenty of fluids but you should avoid alcoholic beverages  for 24 hours.  ACTIVITY:  You should plan to take it easy for the rest of today and you should NOT DRIVE or use heavy machinery until tomorrow (because of the sedation medicines used during the test).    FOLLOW UP: Our staff will call the number listed on your records the next business day following your procedure.  We will call around 7:15- 8:00 am to check on you and address any questions or concerns that you may have regarding the information given to you following your procedure. If we do not reach you, we will leave a message.     If any biopsies were taken you will be contacted by phone or by letter within the next 1-3 weeks.  Please call us at [phone] if you have not heard about the biopsies in 3 weeks.    SIGNATURES/CONFIDENTIALITY: You and/or your care partner have signed paperwork which will be entered into your electronic medical record.  These signatures attest to the fact that that the information above on your After Visit Summary has been reviewed and is understood.  Full responsibility of the confidentiality of this discharge information lies with you and/or your care-partner.

## 2022-02-06 NOTE — Op Note (Signed)
[facility] Patient Name: Randy Wilson Procedure Date: 02/06/2022 10:55 AM MRN: [id number] Endoscopist: Snyder. Danis , MD Age: 56 Referring MD:  Date of Birth: 1965/06/11 Gender: Male Account #: [id number] Procedure:                Colonoscopy Indications:              Disease activity assessment of Crohn's disease of                            the small bowel                           Last colonoscopy Sept 2021 - on Humira monotherapy                            since then (with periods off therapy) - recent drug                            level 4.8 and negative antibodies                           no current symptoms Medicines:                Monitored Anesthesia Care Procedure:                Pre-Anesthesia Assessment:                           - Prior to the procedure, a History and Physical                            was performed, and patient medications and                            allergies were reviewed. The patient's tolerance of                            previous anesthesia was also reviewed. The risks                            and benefits of the procedure and the sedation                            options and risks were discussed with the patient.                            All questions were answered, and informed consent                            was obtained. Prior Anticoagulants: The patient has                            taken no previous anticoagulant or antiplatelet  agents. ASA Grade Assessment: II - A patient with                            mild systemic disease. After reviewing the risks                            and benefits, the patient was deemed in                            satisfactory condition to undergo the procedure.                           After obtaining informed consent, the colonoscope                            was passed under direct vision. Throughout the                            procedure, the  patient's blood pressure, pulse, and                            oxygen saturations were monitored continuously. The                            Olympus CF-HQ190L [id number]) Colonoscope was                            introduced through the anus and advanced to the the                            ileocolonic anastomosis. The colonoscopy was                            performed without difficulty. The patient tolerated                            the procedure well. The quality of the bowel                            preparation was excellent. The rectum and                            ileo-colonic anastomosis were photographed. Scope In: 11:03:47 AM Scope Out: 11:15:25 AM Scope Withdrawal Time: 0 hours 8 minutes 10 seconds  Total Procedure Duration: 0 hours 11 minutes 38 seconds  Findings:                 The perianal and digital rectal examinations were                            normal. Specifically, no peri-anal Crohn's activity.                           There was evidence of a prior side-to-side  ileo-colonic anastomosis in the ascending colon.                            This was patent and was characterized by                            inflammation and ulceration. The anastomosis was                            not traversed. Crohn's activity characterized by                            edema, polypoid inflammation, multiple ulcerations                            and stenosis. The anastomosis could not be                            traversed due to both stenosis and redundant                            colon/scope looping. Overall, the appearance was                            similar to the last exam.                           Multiple diverticula were found in the left colon.                           Internal hemorrhoids were found.                           The exam was otherwise without abnormality on                            direct and retroflexion  views. Complications:            No immediate complications. Estimated Blood Loss:     Estimated blood loss: none. Impression:               - Patent side-to-side ileo-colonic anastomosis,                            characterized by inflammation and ulceration.                           - Diverticulosis in the left colon.                           - Internal hemorrhoids.                           - The examination was otherwise normal on direct  and retroflexion views.                           - No specimens collected. Recommendation:           - Patient has a contact number available for                            emergencies. The signs and symptoms of potential                            delayed complications were discussed with the                            patient. Return to normal activities tomorrow.                            Written discharge instructions were provided to the                            patient.                           - Resume previous diet.                           - Continue present medications.                           - No recommendation at this time regarding repeat                            colonoscopy.                           - Discuss changing treatment to [employer]                            (rizankizumab) [name], MD 02/06/2022 11:26:08 AM This report has been signed electronically.

## 2022-02-06 NOTE — Progress Notes (Signed)
A and O x3. Report to RN. Tolerated MAC anesthesia well. 

## 2022-02-07 ENCOUNTER — Telehealth: Payer: Self-pay

## 2022-02-07 NOTE — Telephone Encounter (Signed)
  Follow up Call-     02/06/2022   10:41 AM 02/25/2020    9:44 AM  Call back number  Post procedure Call Back phone  # [phone]  Permission to leave phone message Yes Yes     Post op call attempted, no answer, left WM.

## 2022-02-27 ENCOUNTER — Telehealth: Payer: Self-pay | Admitting: Gastroenterology

## 2022-02-27 NOTE — Telephone Encounter (Signed)
Inbound call from patient requesting a call back to discuss when he is supposed to start his infusions and when he needs a follow. Please advise.

## 2022-03-01 ENCOUNTER — Other Ambulatory Visit: Payer: Self-pay | Admitting: Pharmacy Technician

## 2022-03-01 ENCOUNTER — Telehealth: Payer: Self-pay | Admitting: Pharmacy Technician

## 2022-03-01 DIAGNOSIS — K5000 Crohn's disease of small intestine without complications: Secondary | ICD-10-CM

## 2022-03-01 NOTE — Telephone Encounter (Signed)
Thank you for the note.  I would like him to continue Humira for now.  I have open a dialogue with the [facility] outpatient infusion center to look into insurance coverage for [employer].  He needs a December clinic appointment to see me as well.  -HD

## 2022-03-01 NOTE — Telephone Encounter (Signed)
Called and spoke with patient. He has been advised to continue Humira for now. He has been advised that we are checking to see if his insurance will cover [employer]. Pt has been scheduled for a follow up appt with Dr. Loletha Carrow on Thursday, 05/03/22 at 10:40 am. Pt is aware that I will send his appt information to his MyChart. Pt verbalized understanding and had no concerns at the end of the call.

## 2022-03-01 NOTE — Telephone Encounter (Addendum)
Dr. Loletha Carrow,  Auth Submission: Denied Payer: BCBS Medication & CPT/J Code(s) submitted: [name]) [phone] Route of submission (phone, fax, portal):  Phone # Fax # Auth type: Buy/Bill Units/visits requested: X3 DOSES Reference number: [id number] Approval from:  to  at [place]   Denied due to patient has not tried and or failed Stelara or Infliximab.  Would you like to try Stelara or you have the option to appeal by calling [phone].

## 2022-03-05 NOTE — Telephone Encounter (Signed)
Infusion center staff,  Yes, please work on authorization for Stelara at standard dosing. ____________________________________ Herbert Seta,  Please let Mr. Milstein know we are trying for Delsa Grana since his insurance denied [employer]rizi due to their formulary restrictions.  - HD

## 2022-03-06 ENCOUNTER — Other Ambulatory Visit: Payer: Self-pay | Admitting: Pharmacy Technician

## 2022-03-06 NOTE — Telephone Encounter (Signed)
Skyrizi rep Anderson Malta called to go over some additional resources she has for patients that have been denied. Please call her if your interested at [phone].

## 2022-03-06 NOTE — Telephone Encounter (Signed)
Randy Wilson has been denied.  Re-submitted Randy Wilson for Randy Wilson Submission: PENDING Payer: BCBS Medication & CPT/J Code(s) submitted: Stelara Infusion (Ustekinumab) J3358 Route of submission (phone, fax, portal): [place] [facility] Phone # Fax # Auth type: Buy/Bill Units/visits requested: 520MG IV Reference number: [id number] Approval from:  to  at [place]

## 2022-03-06 NOTE — Telephone Encounter (Signed)
Called and spoke with patient. Pt has been advised that his insurance denied coverage for [employer] and we are working on [employer] for [employer]. Pt knows that we will contact him once we have a response. Pt verbalized understanding and had no concerns at the end of the call.

## 2022-03-07 ENCOUNTER — Encounter: Payer: Self-pay | Admitting: Gastroenterology

## 2022-03-07 ENCOUNTER — Encounter: Payer: Self-pay | Admitting: Pharmacy Technician

## 2022-03-07 NOTE — Telephone Encounter (Signed)
I have decided to go with Stelara for this patient.  - HD

## 2022-03-07 NOTE — Telephone Encounter (Addendum)
Dr. Pincus Sanes, Delsa Grana has been approved and we will schedule patient as soon as possible.  Auth Submission: APPROVED Payer: BCBS Medication & CPT/J Code(s) submitted: Stelara Infusion (Ustekinumab) J3358 Route of submission (phone, fax, portal): [place] [facility] Phone # Fax # Auth type: Buy/Bill Units/visits requested: 520MG X1 DOSE Reference number: [id number] Approval from: 03/06/22 to 05/05/22   Stelara co-pay card: APPROVED GR: [id number] ID: [id number] BIN: [id number] FAX: [phone] EOB/CLAIM PHONE: [phone] MAX: $[phone]  @[email or web address] .  Please start PA for Pharmacy benefit after patient have received infusion.

## 2022-03-07 NOTE — Telephone Encounter (Signed)
Understood, thank you  - H. Danis

## 2022-03-08 ENCOUNTER — Encounter: Payer: Self-pay | Admitting: Emergency Medicine

## 2022-03-08 ENCOUNTER — Ambulatory Visit: Payer: BC Managed Care – PPO | Admitting: Emergency Medicine

## 2022-03-08 VITALS — BP 126/84 | HR 64 | Temp 98.1°F | Ht 76.0 in | Wt 228.0 lb

## 2022-03-08 DIAGNOSIS — N5201 Erectile dysfunction due to arterial insufficiency: Secondary | ICD-10-CM

## 2022-03-08 DIAGNOSIS — R7303 Prediabetes: Secondary | ICD-10-CM | POA: Diagnosis not present

## 2022-03-08 DIAGNOSIS — K5000 Crohn's disease of small intestine without complications: Secondary | ICD-10-CM

## 2022-03-08 DIAGNOSIS — E785 Hyperlipidemia, unspecified: Secondary | ICD-10-CM | POA: Diagnosis not present

## 2022-03-08 MED ORDER — USTEKINUMAB 90 MG/ML ~~LOC~~ SOSY: 90.0000 mg | PREFILLED_SYRINGE | SUBCUTANEOUS | 5 refills | Status: DC

## 2022-03-08 MED ORDER — SILDENAFIL CITRATE 100 MG PO TABS: 50.0000 mg | ORAL_TABLET | Freq: Every day | ORAL | 11 refills | Status: DC | PRN

## 2022-03-08 NOTE — Assessment & Plan Note (Signed)
Stable.  Sees gastrointestinal doctor on a regular basis. Set to start Stelara injections.

## 2022-03-08 NOTE — Patient Instructions (Signed)
Health Maintenance, Male Adopting a healthy lifestyle and getting preventive care are important in promoting health and wellness. Ask your health care provider about: The right schedule for you to have regular tests and exams. Things you can do on your own to prevent diseases and keep yourself healthy. What should I know about diet, weight, and exercise? Eat a healthy diet  Eat a diet that includes plenty of vegetables, fruits, low-fat dairy products, and lean protein. Do not eat a lot of foods that are high in solid fats, added sugars, or sodium. Maintain a healthy weight Body mass index (BMI) is a measurement that can be used to identify possible weight problems. It estimates body fat based on height and weight. Your health care provider can help determine your BMI and help you achieve or maintain a healthy weight. Get regular exercise Get regular exercise. This is one of the most important things you can do for your health. Most adults should: Exercise for at least 150 minutes each week. The exercise should increase your heart rate and make you sweat (moderate-intensity exercise). Do strengthening exercises at least twice a week. This is in addition to the moderate-intensity exercise. Spend less time sitting. Even light physical activity can be beneficial. Watch cholesterol and blood lipids Have your blood tested for lipids and cholesterol at 58 years of age, then have this test every 5 years. You may need to have your cholesterol levels checked more often if: Your lipid or cholesterol levels are high. You are older than 58 years of age. You are at high risk for heart disease. What should I know about cancer screening? Many types of cancers can be detected early and may often be prevented. Depending on your health history and family history, you may need to have cancer screening at various ages. This may include screening for: Colorectal cancer. Prostate cancer. Skin cancer. Lung  cancer. What should I know about heart disease, diabetes, and high blood pressure? Blood pressure and heart disease High blood pressure causes heart disease and increases the risk of stroke. This is more likely to develop in people who have high blood pressure readings or are overweight. Talk with your health care provider about your target blood pressure readings. Have your blood pressure checked: Every 3-5 years if you are 18-39 years of age. Every year if you are 40 years old or older. If you are between the ages of 65 and 75 and are a current or former smoker, ask your health care provider if you should have a one-time screening for abdominal aortic aneurysm (AAA). Diabetes Have regular diabetes screenings. This checks your fasting blood sugar level. Have the screening done: Once every three years after age 45 if you are at a normal weight and have a low risk for diabetes. More often and at a younger age if you are overweight or have a high risk for diabetes. What should I know about preventing infection? Hepatitis B If you have a higher risk for hepatitis B, you should be screened for this virus. Talk with your health care provider to find out if you are at risk for hepatitis B infection. Hepatitis C Blood testing is recommended for: Everyone born from 1945 through 1965. Anyone with known risk factors for hepatitis C. Sexually transmitted infections (STIs) You should be screened each year for STIs, including gonorrhea and chlamydia, if: You are sexually active and are younger than 58 years of age. You are older than 58 years of age and your   health care provider tells you that you are at risk for this type of infection. Your sexual activity has changed since you were last screened, and you are at increased risk for chlamydia or gonorrhea. Ask your health care provider if you are at risk. Ask your health care provider about whether you are at high risk for HIV. Your health care provider  may recommend a prescription medicine to help prevent HIV infection. If you choose to take medicine to prevent HIV, you should first get tested for HIV. You should then be tested every 3 months for as long as you are taking the medicine. Follow these instructions at home: Alcohol use Do not drink alcohol if your health care provider tells you not to drink. If you drink alcohol: Limit how much you have to 0-2 drinks a day. Know how much alcohol is in your drink. In the [place] one drink equals one 12 oz bottle of beer (355 mL), one 5 oz glass of wine (148 mL), or one 1 oz glass of hard liquor (44 mL). Lifestyle Do not use any products that contain nicotine or tobacco. These products include cigarettes, chewing tobacco, and vaping devices, such as e-cigarettes. If you need help quitting, ask your health care provider. Do not use street drugs. Do not share needles. Ask your health care provider for help if you need support or information about quitting drugs. General instructions Schedule regular health, dental, and eye exams. Stay current with your vaccines. Tell your health care provider if: You often feel depressed. You have ever been abused or do not feel safe at home. Summary Adopting a healthy lifestyle and getting preventive care are important in promoting health and wellness. Follow your health care provider's instructions about healthy diet, exercising, and getting tested or screened for diseases. Follow your health care provider's instructions on monitoring your cholesterol and blood pressure. This information is not intended to replace advice given to you by your health care provider. Make sure you discuss any questions you have with your health care provider. Document Revised: 10/03/2020 Document Reviewed: 10/03/2020 Elsevier Patient Education  2023 Elsevier Inc.  

## 2022-03-08 NOTE — Progress Notes (Signed)
Randy Wilson 56 y.o.   Chief Complaint  Patient presents with  . Follow-up    Patient requesting cardiac scoring test    HISTORY OF PRESENT ILLNESS: This is a 56 y.o. male A1A here for 25-monthfollow-up. Inquiring and requesting cardiac scoring test. History of prediabetes and dyslipidemia Also history of Crohn's disease presently on Stelara. Overall doing well. No other complaints or medical concerns today. The 10-year ASCVD risk score (Arnett DK, et al., 2019) is: 7.2%   Values used to calculate the score:     Age: [age]years     Sex: Male     Is Non-Hispanic African American: Yes     Diabetic: No     Tobacco smoker: No     Systolic Blood Pressure: 1048mmHg     Is BP treated: No     HDL Cholesterol: 52.9 mg/dL     Total Cholesterol: 191 mg/dL   HPI   Prior to Admission medications   Medication Sig Start Date End Date Taking? Authorizing Provider  allopurinol (ZYLOPRIM) 100 MG tablet TAKE 1 TABLET BY MOUTH EVERY DAY 12/25/21  Yes MMaximiano Coss NP  cetirizine (ZYRTEC) 10 MG tablet Take 1 tablet (10 mg total) by mouth daily. Patient taking differently: Take 10 mg by mouth as needed. 07/09/18  Yes Stallings, Zoe A, MD  EPINEPHrine 0.3 mg/0.3 mL IJ SOAJ injection Inject 0.3 mg into the muscle as needed for anaphylaxis. 06/05/21  Yes Beverlie Kurihara, MInes Bloomer MD  fluticasone ([facility] 50 MCG/ACT nasal spray    Yes [provider]  ibuprofen (ADVIL) 200 MG tablet Take 600 mg by mouth every 6 (six) hours as needed. Takes 3 of 200 mg ibuprofen   Yes [provider]  sildenafil (VIAGRA) 100 MG tablet Take 0.5-1 tablets (50-100 mg total) by mouth daily as needed for erectile dysfunction. 01/11/22  Yes Majour Frei, MInes Bloomer MD  triamcinolone cream (KENALOG) 0.1 % Apply 1 Application topically 2 (two) times daily. 12/27/21  Yes MMaximiano Coss NP  ustekinumab (STELARA) 90 MG/ML SOSY injection Inject 1 mL (90 mg total) into the skin every 8 (eight) weeks. 03/08/22  Yes  Danis, HKirke Corin MD    Allergies  Allergen Reactions  . Tramadol Itching  . Shrimp [Shellfish Allergy] Hives    Patient Active Problem List   Diagnosis Date Noted  . Crohn's disease of small intestine ([place] 03/01/2022  . Eczema 12/27/2021  . Residual hemorrhoidal skin tags 03/31/2020  . Prediabetes 01/20/2020  . Erectile dysfunction 01/20/2020  . 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 Asthma 04/24/2015  . Hx of colonic polyp 11/08/2014  . History of colonic polyps 09/17/2014    Past Medical History:  Diagnosis Date  . Cro[place]'s disease of small intestine ([place] 2017   followed by dr h. danis;  s/p colon resection 11-02-2015,  on humira  . History of asthma    child  . History of colon polyps   . History of COVID-19 05/30/2020   positive result in epicrunny nose/cough x 3 weeks all symptoms resolved  . History of gout    last flare up 1 year ago per pt on 04-03-2021  . OA (osteoarthritis)   . Right wrist fracture    fell off ladder per pt on 02-08-2021  . Right wrist fracture, closed, initial encounter 01/22/2021    Past Surgical History:  Procedure Laterality Date  . ANKLE SURGERY Left 2002   bone spurs removed  . COLONOSCOPY  last one 02-25-2020  by dr h. Loletha Carrow  . HARDWARE REMOVAL Right 04/06/2021   Procedure: Right wrist removal of hardware;  Surgeon: [name], MD;  Location[facility];  Service: Orthopedics;  Laterality: Right;  with local anesthesia Needs 30 minutes  . LAPAROSCOPIC ILEOCECECTOMY N/A 11/02/2015   Procedure: LAPAROSCOPIC ILEOCECECTOMY;  Surgeon: Leighton Ruff, MD;  Location: [facility];  Service: General;  Laterality: N/A;  . ORIF WRIST FRACTURE Right 02/09/2021   Procedure: OPEN REDUCTION INTERNAL FIXATION (ORIF) WRIST FRACTURE;  Surgeon: [name], MD;  Location: [place];  Service: Orthopedics;  Laterality: Right;  WITH REGIONAL  . SHOULDER ARTHROSCOPY W/ SUBACROMIAL DECOMPRESSION AND DISTAL  CLAVICLE EXCISION Left 05/07/2008   @[facility]  by dr Berenice Primas;   bone spurs removed    Social History   Socioeconomic History  . Marital status: Married    Spouse name: Kristeen Miss   . Number of children: Not on file  . Years of education: Not on file  . Highest education level: High school graduate  Occupational History  . Occupation: Recieving Clerk    Comment: [employer]  Tobacco Use  . Smoking status: Former    Packs/day: 1.00    Years: 25.00    Total pack years: 25.00    Types: Cigarettes    Quit date: 01/21/2006    Years since quitting: 16.1  . Smokeless tobacco: Never  Vaping Use  . Vaping Use: Never used  Substance and Sexual Activity  . Alcohol use: Yes    Alcohol/week: 6.0 standard drinks of alcohol    Types: 6 Cans of beer per week    Comment: [facility] beer  . Drug use: Not Currently  . Sexual activity: Yes    Comment: with monogamous wife, she takes OCP  Other Topics Concern  . Not on file  Social History Narrative   Pt is from [place].    Social Determinants of Health   Financial Resource Strain: Low Risk  (10/24/2017)   Overall Financial Resource Strain (CARDIA)   . Difficulty of Paying Living Expenses: Not hard at all  Food Insecurity: No Food Insecurity (10/24/2017)   Hunger Vital Sign   . Worried About Charity fundraiser in the Last Year: Never true   . Ran Out of Food in the Last Year: Never true  Transportation Needs: No Transportation Needs (10/24/2017)   PRAPARE - Transportation   . Lack of Transportation (Medical): No   . Lack of Transportation (Non-Medical): No  Physical Activity: Sufficiently Active (10/24/2017)   Exercise Vital Sign   . Days of Exercise per Week: 4 days   . Minutes of Exercise per Session: 60 min  Stress: No Stress Concern Present (10/24/2017)   [place]   . Feeling of Stress : Not at all  Social Connections: Moderately Integrated (10/24/2017)   Social Connection and  Isolation Panel [NHANES]   . Frequency of Communication with Friends and Family: More than three times a week   . Frequency of Social Gatherings with Friends and Family: More than three times a week   . Attends Religious Services: 1 to 4 times per year   . Active Member of Clubs or Organizations: No   . Attends Archivist Meetings: Never   . Marital Status: Married  Human resources officer Violence: Not At Risk (10/24/2017)   Humiliation, Afraid, Rape, and Kick questionnaire   . Fear of Current or Ex-Partner: No   . Emotionally  Abused: No   . Physically Abused: No   . Sexually Abused: No    Family History  Problem Relation Age of Onset  . Asthma Mother 86       asthma attack   . Stroke Brother        Myocardial infarction  . Stroke Brother   . Asthma Sister   . Colon cancer Neg Hx   . Stomach cancer Neg Hx   . Rectal cancer Neg Hx   . Esophageal cancer Neg Hx   . Colon polyps Neg Hx   . Allergic rhinitis Neg Hx   . Angioedema Neg Hx   . Eczema Neg Hx   . Urticaria Neg Hx   . Liver cancer Neg Hx   . Pancreatic cancer Neg Hx      Review of Systems  Constitutional: Negative.  Negative for chills and fever.  HENT: Negative.  Negative for congestion and sore throat.   Respiratory: Negative.  Negative for cough and shortness of breath.   Cardiovascular: Negative.  Negative for chest pain and palpitations.  Gastrointestinal:  Negative for abdominal pain, diarrhea, nausea and vomiting.  Genitourinary: Negative.   Skin: Negative.  Negative for rash.  Neurological: Negative.  Negative for dizziness and headaches.  All other systems reviewed and are negative.  Today's Vitals   03/08/22 1437  BP: 126/84  Pulse: 64  Temp: 98.1 F (36.7 C)  TempSrc: Oral  SpO2: 98%  Weight: 228 lb (103.4 kg)  Height: 6' 4"  (1.93 m)   Body mass index is 27.75 kg/m.   Physical Exam Vitals reviewed.  Constitutional:      Appearance: Normal appearance.  HENT:     Head:  Normocephalic.  Eyes:     Extraocular Movements: Extraocular movements intact.  Cardiovascular:     Rate and Rhythm: Normal rate.  Pulmonary:     Effort: Pulmonary effort is normal.  Skin:    General: Skin is warm and dry.  Neurological:     Mental Status: He is alert and oriented to person, place, and time.  Psychiatric:        Mood and Affect: Mood normal.        Behavior: Behavior normal.     ASSESSMENT & PLAN: A total of 40 minutes was spent with the patient and counseling/coordination of care regarding preparing for this visit, review of most recent office visit notes, review of most recent blood work results, review of multiple chronic medical problems and their management, review of all medications, education on nutrition, prognosis, documentation, need for follow-up.  Problem List Items Addressed This Visit       Digestive   Crohn's disease of small intestine ([place])    Stable.  Sees gastrointestinal doctor on a regular basis. Set to start Stelara injections.        Other   Prediabetes    Stable.  Diet and nutrition discussed. Advised to decrease amount of daily carbohydrate intake and daily calories and increase amount of plant based protein in his diet.      Relevant Orders   CT CARDIAC SCORING (SELF PAY ONLY)   Erectile dysfunction   Relevant Medications   sildenafil (VIAGRA) 100 MG tablet   Dyslipidemia - Primary    Stable.  Diet and nutrition discussed. The 10-year ASCVD risk score (Arnett DK, et al., 2019) is: 7.2%   Values used to calculate the score:     Age: 40 years     Sex: Male  Is Non-Hispanic African American: Yes     Diabetic: No     Tobacco smoker: No     Systolic Blood Pressure: 212 mmHg     Is BP treated: No     HDL Cholesterol: 52.9 mg/dL     Total Cholesterol: 191 mg/dL       Relevant Orders   CT CARDIAC SCORING (SELF PAY ONLY)   Patient Instructions  Health Maintenance, Male Adopting a healthy lifestyle and getting preventive  care are important in promoting health and wellness. Ask your health care provider about: The right schedule for you to have regular tests and exams. Things you can do on your own to prevent diseases and keep yourself healthy. What should I know about diet, weight, and exercise? Eat a healthy diet  Eat a diet that includes plenty of vegetables, fruits, low-fat dairy products, and lean protein. Do not eat a lot of foods that are high in solid fats, added sugars, or sodium. Maintain a healthy weight Body mass index (BMI) is a measurement that can be used to identify possible weight problems. It estimates body fat based on height and weight. Your health care provider can help determine your BMI and help you achieve or maintain a healthy weight. Get regular exercise Get regular exercise. This is one of the most important things you can do for your health. Most adults should: Exercise for at least 150 minutes each week. The exercise should increase your heart rate and make you sweat (moderate-intensity exercise). Do strengthening exercises at least twice a week. This is in addition to the moderate-intensity exercise. Spend less time sitting. Even light physical activity can be beneficial. Watch cholesterol and blood lipids Have your blood tested for lipids and cholesterol at 58 years of age, then have this test every 5 years. You may need to have your cholesterol levels checked more often if: Your lipid or cholesterol levels are high. You are older than 58 years of age. You are at high risk for heart disease. What should I know about cancer screening? Many types of cancers can be detected early and may often be prevented. Depending on your health history and family history, you may need to have cancer screening at various ages. This may include screening for: Colorectal cancer. Prostate cancer. Skin cancer. Lung cancer. What should I know about heart disease, diabetes, and high blood  pressure? Blood pressure and heart disease High blood pressure causes heart disease and increases the risk of stroke. This is more likely to develop in people who have high blood pressure readings or are overweight. Talk with your health care provider about your target blood pressure readings. Have your blood pressure checked: Every 3-5 years if you are 3-58 years of age. Every year if you are 56 years old or older. If you are between the ages of 27 and 50 and are a current or former smoker, ask your health care provider if you should have a one-time screening for abdominal aortic aneurysm (AAA). Diabetes Have regular diabetes screenings. This checks your fasting blood sugar level. Have the screening done: Once every three years after age 2 if you are at a normal weight and have a low risk for diabetes. More often and at a younger age if you are overweight or have a high risk for diabetes. What should I know about preventing infection? Hepatitis B If you have a higher risk for hepatitis B, you should be screened for this virus. Talk with your health care  provider to find out if you are at risk for hepatitis B infection. Hepatitis C Blood testing is recommended for: Everyone born from [birth year] through 1965/09/14. Anyone with known risk factors for hepatitis C. Sexually transmitted infections (STIs) You should be screened each year for STIs, including gonorrhea and chlamydia, if: You are sexually active and are younger than 58 years of age. You are older than 58 years of age and your health care provider tells you that you are at risk for this type of infection. Your sexual activity has changed since you were last screened, and you are at increased risk for chlamydia or gonorrhea. Ask your health care provider if you are at risk. Ask your health care provider about whether you are at high risk for HIV. Your health care provider may recommend a prescription medicine to help prevent HIV infection. If you  choose to take medicine to prevent HIV, you should first get tested for HIV. You should then be tested every 3 months for as long as you are taking the medicine. Follow these instructions at home: Alcohol use Do not drink alcohol if your health care provider tells you not to drink. If you drink alcohol: Limit how much you have to 0-2 drinks a day. Know how much alcohol is in your drink. In the [place] one drink equals one 12 oz bottle of beer (355 mL), one 5 oz glass of wine (148 mL), or one 1 oz glass of hard liquor (44 mL). Lifestyle Do not use any products that contain nicotine or tobacco. These products include cigarettes, chewing tobacco, and vaping devices, such as e-cigarettes. If you need help quitting, ask your health care provider. Do not use street drugs. Do not share needles. Ask your health care provider for help if you need support or information about quitting drugs. General instructions Schedule regular health, dental, and eye exams. Stay current with your vaccines. Tell your health care provider if: You often feel depressed. You have ever been abused or do not feel safe at home. Summary Adopting a healthy lifestyle and getting preventive care are important in promoting health and wellness. Follow your health care provider's instructions about healthy diet, exercising, and getting tested or screened for diseases. Follow your health care provider's instructions on monitoring your cholesterol and blood pressure. This information is not intended to replace advice given to you by your health care provider. Make sure you discuss any questions you have with your health care provider. Document Revised: 10/03/2020 Document Reviewed: 10/03/2020 Elsevier Patient Education  [place], MD [facility]

## 2022-03-08 NOTE — Telephone Encounter (Signed)
Maintenance dose for Stelara 90 mg/ml every 8 weeks was sent to [facility].   Enrolled patient in Stelara with me nurse ambassador program and they will contact patient within the upcoming days. I called patient and informed him that his maintenance prescription has been sent and he knows to expect a call from a Educational psychologist. Pt verbalized understanding and had no concerns at the end of the call.

## 2022-03-08 NOTE — Addendum Note (Signed)
Addended by: Yevette Edwards on: 03/08/2022 11:57 AM   Modules accepted: Orders

## 2022-03-08 NOTE — Assessment & Plan Note (Signed)
Stable.  Diet and nutrition discussed. Advised to decrease amount of daily carbohydrate intake and daily calories and increase amount of plant based protein in his diet.

## 2022-03-08 NOTE — Assessment & Plan Note (Signed)
Stable.  Diet and nutrition discussed. The 10-year ASCVD risk score (Arnett DK, et al., 2019) is: 7.2%   Values used to calculate the score:     Age: 56 years     Sex: Male     Is Non-Hispanic African American: Yes     Diabetic: No     Tobacco smoker: No     Systolic Blood Pressure: 832 mmHg     Is BP treated: No     HDL Cholesterol: 52.9 mg/dL     Total Cholesterol: 191 mg/dL

## 2022-03-14 ENCOUNTER — Encounter: Payer: Self-pay | Admitting: Gastroenterology

## 2022-03-14 ENCOUNTER — Ambulatory Visit
Admission: RE | Admit: 2022-03-14 | Discharge: 2022-03-14 | Disposition: A | Discharge status: Home / Self Care | Payer: No Typology Code available for payment source | Source: Ambulatory Visit | Attending: Emergency Medicine | Admitting: Emergency Medicine

## 2022-03-14 DIAGNOSIS — R7303 Prediabetes: Secondary | ICD-10-CM

## 2022-03-14 DIAGNOSIS — E785 Hyperlipidemia, unspecified: Secondary | ICD-10-CM

## 2022-03-15 ENCOUNTER — Ambulatory Visit (INDEPENDENT_AMBULATORY_CARE_PROVIDER_SITE_OTHER): Payer: BC Managed Care – PPO

## 2022-03-15 VITALS — BP 127/79 | HR 56 | Temp 97.6°F | Resp 18 | Ht 74.0 in | Wt 228.0 lb

## 2022-03-15 DIAGNOSIS — K5000 Crohn's disease of small intestine without complications: Secondary | ICD-10-CM

## 2022-03-15 MED ORDER — USTEKINUMAB 130 MG/26ML IV SOLN
520.0000 mg | Freq: Once | INTRAVENOUS | Status: AC
  Administered 2022-03-15: 520 mg via INTRAVENOUS
  Filled 2022-03-15: qty 104

## 2022-03-15 NOTE — Progress Notes (Signed)
Diagnosis: Crohn's Disease  Provider:  Marshell Garfinkel MD  Procedure: Infusion  IV Type: Peripheral, IV Location: L Antecubital  Stelera (Ustekinumab), Dose: 520  Infusion Start Time: 4259  Infusion Stop Time: 1603  Post Infusion IV Care: Observation period completed and Peripheral IV Discontinued  Discharge: Condition: Good, Destination: Home . AVS provided to patient.   Performed by:  Cleophus Molt, RN

## 2022-03-19 NOTE — Telephone Encounter (Signed)
[place], any update on PA for maintenance dose for Stelara? Thanks

## 2022-04-03 ENCOUNTER — Other Ambulatory Visit (HOSPITAL_COMMUNITY): Payer: Self-pay

## 2022-04-03 ENCOUNTER — Telehealth: Payer: Self-pay | Admitting: Pharmacy Technician

## 2022-04-03 ENCOUNTER — Encounter: Payer: Self-pay | Admitting: Gastroenterology

## 2022-04-03 NOTE — Telephone Encounter (Signed)
Patient Advocate Encounter  Received notification from [place] that prior authorization for [facility] 90MG is required.   PA submitted on 11.7.23 EXPEDITED Key [id number]  Status is pending    Luciano Cutter, CPhT Patient Advocate Phone: [phone]

## 2022-04-03 NOTE — Telephone Encounter (Signed)
PA was submitted expedited and telephone encounter has been created. Will continue to follow.

## 2022-04-04 NOTE — Telephone Encounter (Signed)
Pharmacy Patient Advocate Encounter  Prior Authorization for Stelara 29m/ml has been approved.    PA# [id number] dates: 04/03/2022 through 04/04/2023

## 2022-04-04 NOTE — Telephone Encounter (Signed)
Noted, thanks!

## 2022-04-06 IMAGING — MR MR KNEE*L* W/O CM
4 of 7 series · 21 of 40 positions shown · non-contrast
Comparison: None.

CLINICAL DATA: Left knee pain.  Pain for 3 months.

EXAM:
MRI OF THE LEFT KNEE WITHOUT CONTRAST
TECHNIQUE: Multiplanar, multisequence MR imaging of the knee was performed. No
intravenous contrast was administered.

[Series 3: T2 fat-sat · axial · 4.0mm · 0.50mm/px · z∈[-56,+59]mm · 4 of 24 slices shown]
[im 1/24]
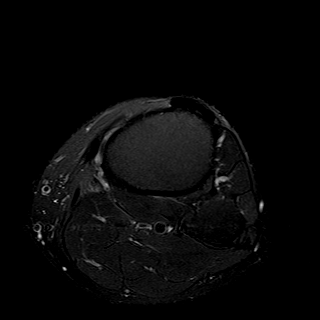
[im 8/24]
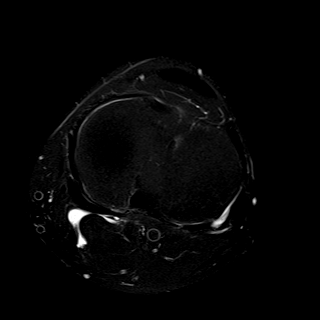
[im 16/24]
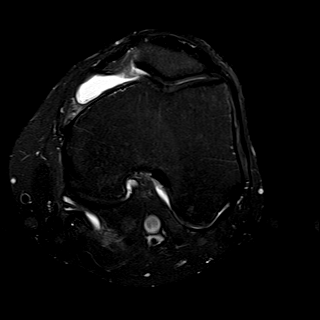
[im 24/24]
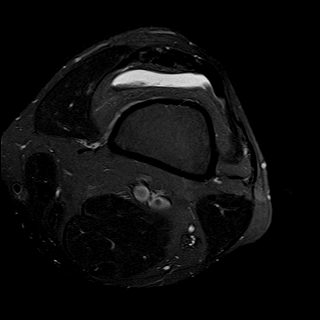

[Series 7: PD fat-sat · sagittal · 3.0mm · 0.29mm/px · 7 of 32 slices shown (1 of 3)]
[im 1/32]
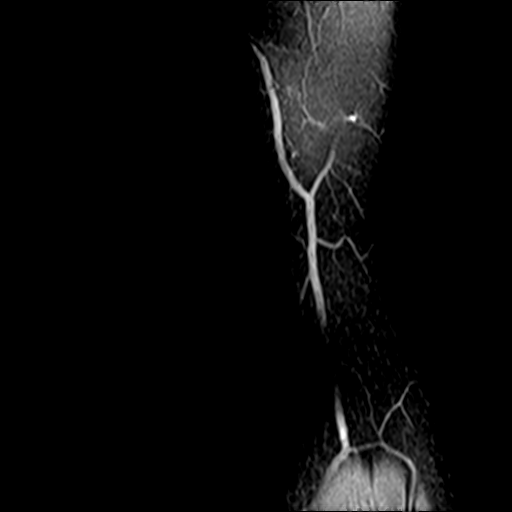
[im 6/32]
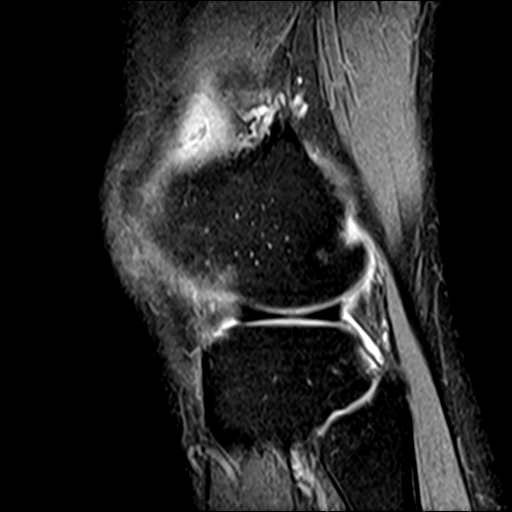
[im 11/32]
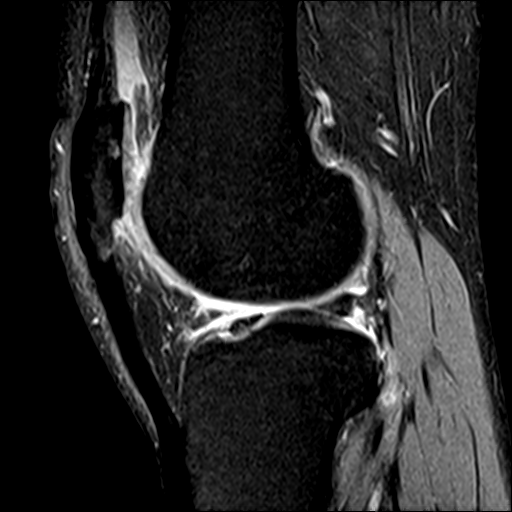
[im 16/32]
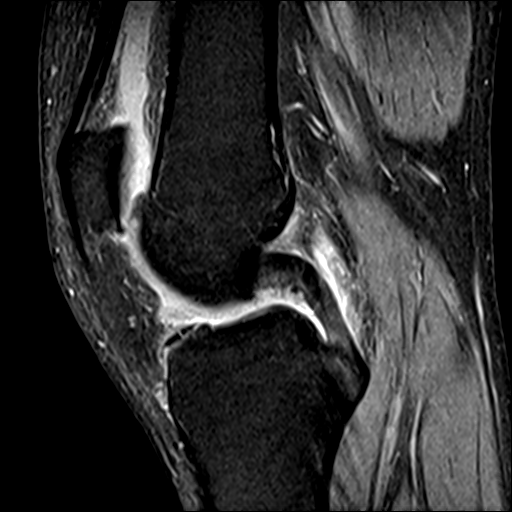
[im 21/32]
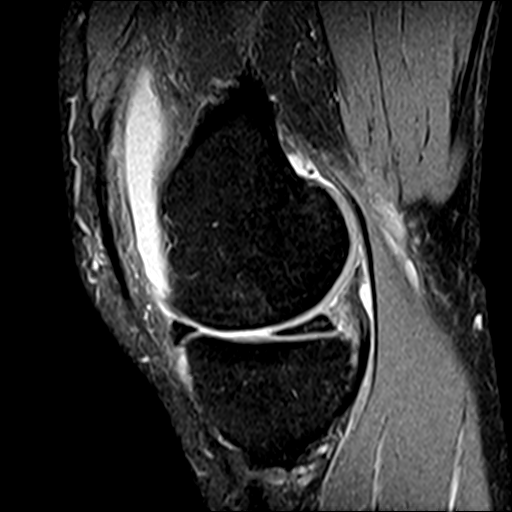
[im 26/32]
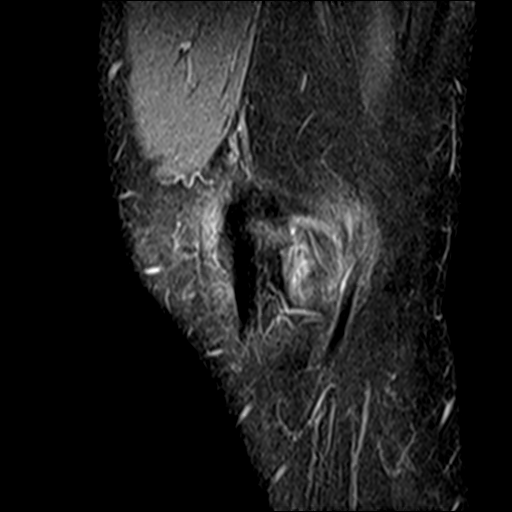
[im 32/32]
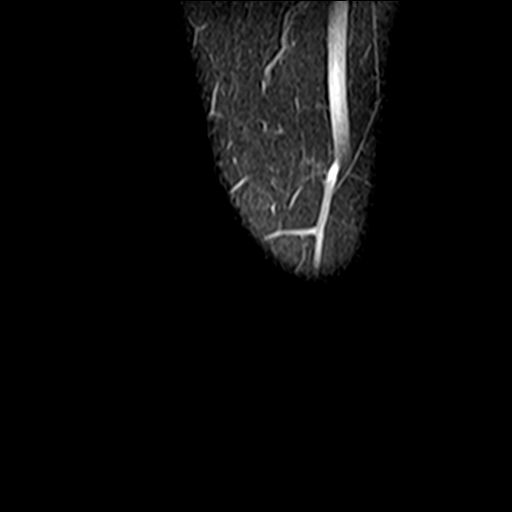

[Series 8: PD fat-sat · coronal · 3.0mm · 0.29mm/px · 7 of 32 slices shown (2 of 3)]
[im 1/32]
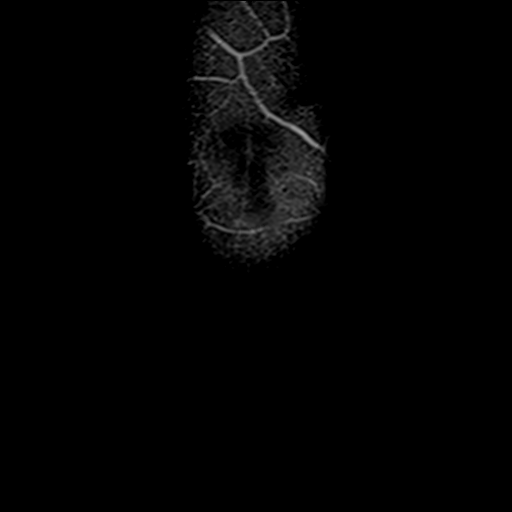
[im 6/32]
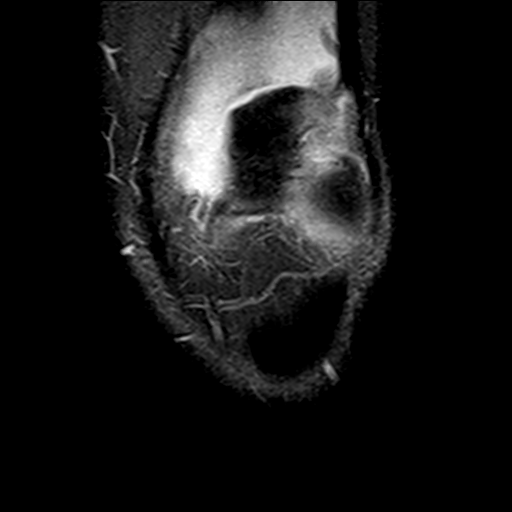
[im 11/32]
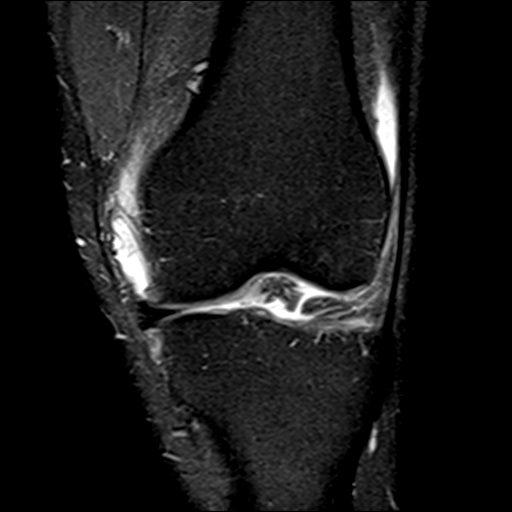
[im 16/32]
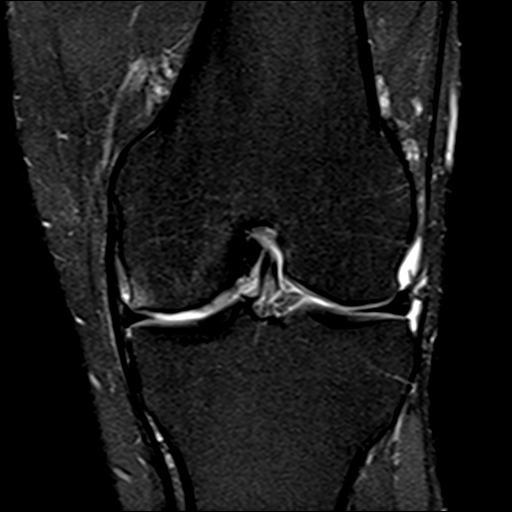
[im 21/32]
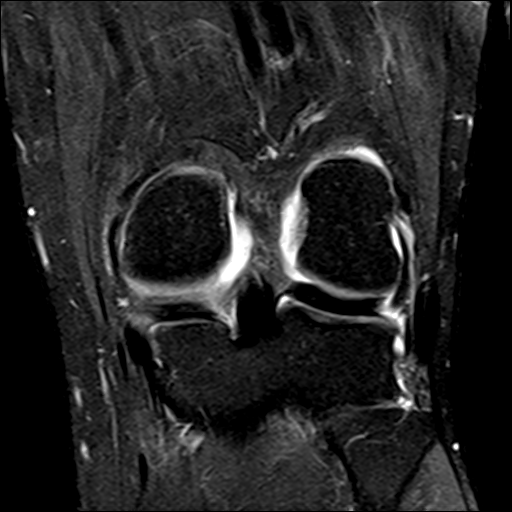
[im 26/32]
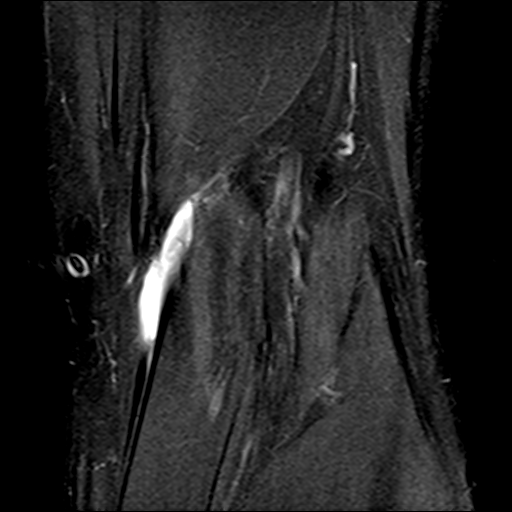
[im 32/32]
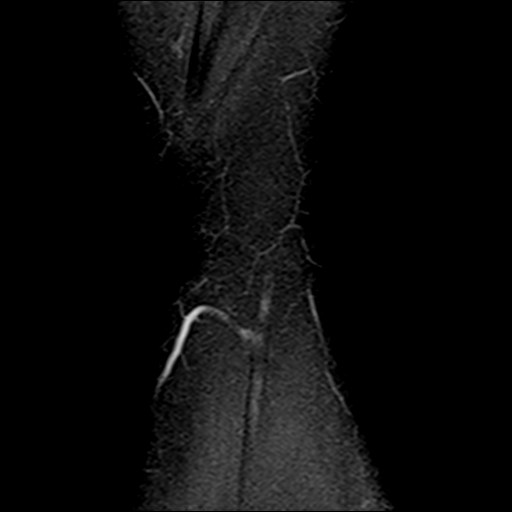

[Series 9: PD fat-sat · oblique · 2.0mm · 0.29mm/px · 3 of 14 slices shown (3 of 3)]
[im 1/14]
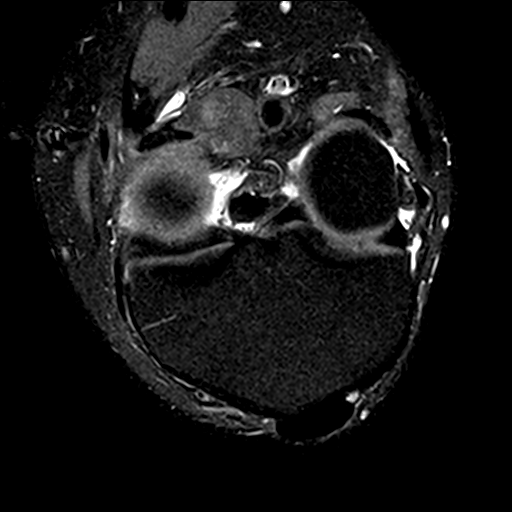
[im 7/14]
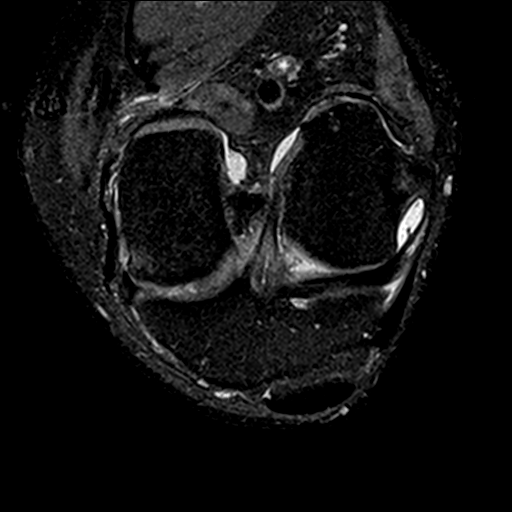
[im 14/14]
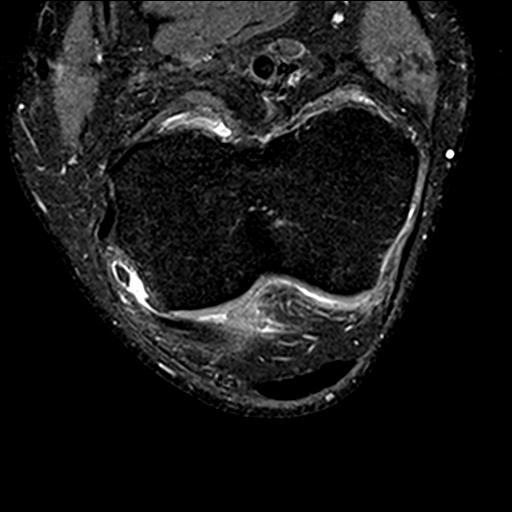

[21 of 40 positions shown; findings below may reference images not displayed]

FINDINGS: MENISCI

Medial: Degeneration of the medial meniscus. Mild peripheral
meniscal extrusion. No discrete meniscal tear.

Lateral: Intact.

LIGAMENTS

Cruciates: ACL and PCL are intact.

Collaterals: Medial collateral ligament is intact. Lateral
collateral ligament complex is intact.

CARTILAGE

Patellofemoral: Partial-thickness cartilage loss of the trochlear
groove. Partial-thickness cartilage loss of the patellar apex.

Medial: High-grade partial-thickness cartilage loss of the medial
femoral condyle. Partial-thickness cartilage loss of the medial
tibial plateau. Subchondral reactive marrow edema at the periphery
of the medial femoral condyle.

Lateral:  No chondral defect.

JOINT: Large joint effusion. Normal Legue[place] Batt[place]. No plical
thickening.

POPLITEAL FOSSA: Popliteus tendon is intact. Small Baker's cyst.

EXTENSOR MECHANISM: Intact quadriceps tendon. Intact patellar
tendon. Intact lateral patellar retinaculum. Intact medial patellar
retinaculum. Intact MPFL.

BONES: No aggressive osseous lesion. No fracture or dislocation.

Other: No fluid collection or hematoma. Muscles are normal.
IMPRESSION: 1. No meniscal or ligamentous injury of the left knee.
2. Partial-thickness cartilage loss of the trochlear groove.
Partial-thickness cartilage loss of the patellar apex.
3. High-grade partial-thickness cartilage loss of the medial femoral
condyle with mild subchondral reactive marrow edema along the
periphery. Partial-thickness cartilage loss of the medial tibial
plateau.

## 2022-04-16 ENCOUNTER — Telehealth: Payer: Self-pay | Admitting: Gastroenterology

## 2022-04-16 NOTE — Telephone Encounter (Signed)
Inbound call from patient requesting medication refill for stillera. Please advise

## 2022-04-16 NOTE — Telephone Encounter (Signed)
Prescription was sent to the pharmacy and PA approval received on 11/7. I tried to reach the pt and voice mail is full.

## 2022-04-17 NOTE — Telephone Encounter (Signed)
Patient called to follow up on his Stillera medication, I did not see any orders in system showing it was sent patient is requesting a call back.

## 2022-04-17 NOTE — Telephone Encounter (Signed)
The pt has been provided the PA approval number. He will call [facility] and have them re run the prescription.

## 2022-05-03 ENCOUNTER — Ambulatory Visit: Payer: BC Managed Care – PPO | Admitting: Gastroenterology

## 2022-05-03 ENCOUNTER — Encounter: Payer: Self-pay | Admitting: Gastroenterology

## 2022-05-03 VITALS — BP 140/88 | HR 65 | Ht 74.0 in | Wt 228.0 lb

## 2022-05-03 DIAGNOSIS — K5000 Crohn's disease of small intestine without complications: Secondary | ICD-10-CM

## 2022-05-03 DIAGNOSIS — Z7960 Long term (current) use of unspecified immunomodulators and immunosuppressants: Secondary | ICD-10-CM | POA: Diagnosis not present

## 2022-05-03 NOTE — Patient Instructions (Signed)
_______________________________________________________  If you are age 57 or older, your body mass index should be between 23-30. Your Body mass index is 29.27 kg/m. If this is out of the aforementioned range listed, please consider follow up with your Primary Care Provider.  If you are age 53 or younger, your body mass index should be between 19-25. Your Body mass index is 29.27 kg/m. If this is out of the aformentioned range listed, please consider follow up with your Primary Care Provider.   ________________________________________________________  The [facility] providers would like to encourage you to use [facility] to communicate with providers for non-urgent requests or questions.  Due to long hold times on the telephone, sending your provider a message by [facility] may be a faster and more efficient way to get a response.  Please allow 48 business hours for a response.  Please remember that this is for non-urgent requests.  _______________________________________________________  Follow up in Feb 2024   It was a pleasure to see you today!  Thank you for trusting me with your gastrointestinal care!

## 2022-05-03 NOTE — Progress Notes (Signed)
[facility] Progress Note  Chief Complaint: Ileal Crohn's disease  Subjective  History: Randy Wilson is here to follow-up his Crohn's disease, last seen for colonoscopy 02/06/2022.  Findings as follows: The perianal and digital rectal examinations were normal. Specifically, no peri-anal Crohn's activity. Findings: - There was evidence of a prior side-to-side ileo-colonic anastomosis in the ascending colon. This was patent and was characterized by inflammation and ulceration. The anastomosis was not traversed. Crohn's activity characterized by edema, polypoid inflammation, multiple ulcerations and stenosis. The anastomosis could not be traversed due to both stenosis and redundant colon/scope looping. Overall, the appearance was similar to the last exam. - Multiple diverticula were found in the left colon. - Internal hemorrhoids were found. - The exam was otherwise without abnormality on direct and retroflexion views.  He had been on Humira monotherapy for 3 years (some periods off the medicine due to noncompliance with treatment and follow-up at times).  Humira level 4.7 with no detectable antibodies on 01/16/2022.  Given colonoscopy findings above, my recommendation was to begin [name], though his insurance denied it and he had to start Stelara instead.  That medicine was approved approximately 4 weeks ago, and he had his first infusion.  He tells me that for self-administered treatment is about a week from now. ______________   Randy Wilson feels well per usual for him.  He has rarely had much in the way of symptoms.  A few weeks ago he had about 2 days of some left-sided abdominal pain and a loose stool that resolved, and he thinks it was either an infection or something he ate.  He denies rectal bleeding, appetite is good, no chronic upper digestive symptoms, and weight stable.  No side effect or other difficulty with the Stelara infusion, and he is confident he can keep himself the  additional treatment since he had previously done well with Humira.  ROS: Cardiovascular:  no chest pain Respiratory: no dyspnea  The patient's Past Medical, Family and Social History were reviewed and are on file in the EMR.  Objective:  Med list reviewed  Current Outpatient Medications:    allopurinol (ZYLOPRIM) 100 MG tablet, TAKE 1 TABLET BY MOUTH EVERY DAY, Disp: 90 tablet, Rfl: 3   cetirizine (ZYRTEC) 10 MG tablet, Take 1 tablet (10 mg total) by mouth daily. (Patient taking differently: Take 10 mg by mouth as needed.), Disp: 30 tablet, Rfl: 11   EPINEPHrine 0.3 mg/0.3 mL IJ SOAJ injection, Inject 0.3 mg into the muscle as needed for anaphylaxis., Disp: 1 each, Rfl: 3   fluticasone (FLONASE) 50 MCG/ACT nasal spray, , Disp: , Rfl:    ibuprofen (ADVIL) 200 MG tablet, Take 600 mg by mouth every 6 (six) hours as needed. Takes 3 of 200 mg ibuprofen, Disp: , Rfl:    sildenafil (VIAGRA) 100 MG tablet, Take 0.5-1 tablets (50-100 mg total) by mouth daily as needed for erectile dysfunction., Disp: 5 tablet, Rfl: 11   triamcinolone cream (KENALOG) 0.1 %, Apply 1 Application topically 2 (two) times daily., Disp: 30 g, Rfl: 11   ustekinumab (STELARA) 90 MG/ML SOSY injection, Inject 1 mL (90 mg total) into the skin every 8 (eight) weeks., Disp: 1 mL, Rfl: 5   Vital signs in last 24 hrs: Vitals:   05/03/22 1023  BP: (!) 140/88  Pulse: 65  SpO2: 98%   Wt Readings from Last 3 Encounters:  05/03/22 228 lb (103.4 kg)  03/15/22 228 lb (103.4 kg)  03/08/22 228 lb (103.4 kg)  Physical Exam  Looks well Cardiac: Regular without murmur,  no peripheral edema Pulm: clear to auscultation bilaterally, normal RR and effort noted Abdomen: soft, no tenderness, with active bowel sounds. No guarding or palpable hepatosplenomegaly  Labs:  Known to have undetectable TPMT (<3) checked Aug 2021     Latest Ref Rng & Units 12/27/2021    2:17 PM 06/05/2021   11:16 AM 01/03/2021    2:01 PM  CBC  WBC 4.0 -  10.5 K/uL 6.2  6.2  7.0   Hemoglobin 13.0 - 17.0 g/dL 13.9  15.0  14.9   Hematocrit 39.0 - 52.0 % 40.9  44.9  43.9   Platelets 150.0 - 400.0 K/uL 247.0  257.0  249.0       Latest Ref Rng & Units 12/27/2021    2:17 PM 06/05/2021   11:16 AM 01/03/2021    2:01 PM  CMP  Glucose 70 - 99 mg/dL 105  89  92   BUN 6 - 23 mg/dL _0 Creatinine 0.40 - 1.50 mg/dL 0.87  1.00  1.05   Sodium 135 - 145 mEq/L 139  140  138   Potassium 3.5 - 5.1 mEq/L 4.2  4.5  4.1   Chloride 96 - 112 mEq/L 105  103  103   CO2 19 - 32 mEq/L _1 Calcium 8.4 - 10.5 mg/dL 9.3  9.8  9.3   Total Protein 6.0 - 8.3 g/dL 7.4  7.9  7.7   Total Bilirubin 0.2 - 1.2 mg/dL 0.6  1.0  0.6   Alkaline Phos 39 - 117 U/L 63  62  69   AST 0 - 37 U/L _2 ALT 0 - 53 U/L 23  32  15      ___________________________________________ Radiologic studies:   ____________________________________________ Other:   _____________________________________________ Assessment & Plan  Assessment: Encounter Diagnoses  Name Primary?   Crohn's disease of small intestine without complication ([place]) Yes   Long-term use of immunosuppressant medication    Recurrent Crohn's in the neoterminal ileum/anastomosis after prior surgery, failed Humira monotherapy at adequate dosing with normal levels and no antibodies.  I have been reluctant to use thiopurine's with him because he was often noncompliant with follow-up and treatment, thus increasing the risk of medication related complications if he is not monitored closely. He has been good lately about attending a preprocedure office visit, then his colonoscopy, and then today's visit.  So I raised the possibility of needing to add azathioprine to his Stelara in the next 4 to 6 months depending on his response to treatment.   Plan: Continue self-administered stelara per usual treatment plan.  See me in 2 months.  At that time, check CBC, CMP, ESR, CRP, B12 and fecal  calprotectin  Call sooner if needed  Nelida Meuse III

## 2022-05-29 ENCOUNTER — Telehealth: Payer: Self-pay

## 2022-05-29 NOTE — Telephone Encounter (Signed)
Received a call from Lindajo Royal with [employer] - she wanted to follow up on [place]. I informed Anderson Malta that patient is not proceeding with Skyrizi and Dr. Loletha Carrow proceeded with Stelara. Anderson Malta states that she will make notation in patient's account.

## 2022-06-25 ENCOUNTER — Encounter: Payer: Self-pay | Admitting: Emergency Medicine

## 2022-06-25 ENCOUNTER — Ambulatory Visit: Payer: BC Managed Care – PPO | Admitting: Emergency Medicine

## 2022-06-25 VITALS — BP 122/72 | HR 69 | Temp 98.0°F | Ht 74.0 in | Wt 228.0 lb

## 2022-06-25 DIAGNOSIS — S060X1A Concussion with loss of consciousness of 30 minutes or less, initial encounter: Secondary | ICD-10-CM | POA: Diagnosis not present

## 2022-06-25 NOTE — Patient Instructions (Signed)
Concussion, Adult  A concussion is a brain injury from a hard, direct hit (trauma) to your head or body. This hit causes your brain to quickly shake back and forth inside your skull. A concussion may also be called a mild traumatic brain injury (TBI). Healing from this injury can take time. The effects of a concussion can be serious. If you have a concussion, you should be very careful to avoid having a second concussion. What are the causes? This condition is caused by: A direct hit to your head. A quick and sudden movement of the head or neck, such as in a car crash. What are the signs or symptoms? The signs of a concussion can be hard to notice. They may be missed by you, family members, and doctors. You may look fine on the outside but may not act or feel normal. Physical symptoms Headaches or feeling dizzy. Problems with body balance. Being sensitive to light or noise. Vomiting or feeling like you may vomit. Being tired. Problems seeing or hearing. Seizure. Mental and emotional symptoms Feeling grouchy (irritable) or having mood changes. Problems remembering things. Trouble focusing your mind (concentrating), organizing, or making decisions. Not sleeping or eating as you used to. Being slow to think, act, react, speak, or read. Feeling worried or nervous (anxious). Feeling sad (depressed). How is this treated? This condition may be treated by: Stopping sports or activity if you are injured. Resting your body and your mind. Being watched carefully, often at home. Medicines to help with symptoms such as: Headaches. Feeling like you may vomit. Problems with sleep. You may need to go to a concussion clinic or a place to help you recover (rehab). Follow these instructions at home: Activity Limit activities that need a lot of thought or focus, such as: Homework or work for your job. Watching TV. Using the computer or phone. Playing memory games and puzzles. Get rest because  this helps your brain heal. Make sure you: Get plenty of sleep. Most adults should get 7-9 hours of sleep each night. Rest during the day. Take naps or breaks when you feel tired. Avoid activity or exercise that takes a lot of effort until your doctor says it is safe. Stop any activity that makes symptoms worse. Your doctor may tell you to do light exercise like walking. Do not do activities that could cause a second concussion, such as riding a bike or playing sports. Ask your doctor when you can return to your normal activities, such as school, work, sports, and driving. Your ability to react may be slower. Do not do these activities if you are dizzy. General instructions  Take over-the-counter and prescription medicines only as told by your doctor. Avoid taking strong pain medicines (opioids) after a concussion. Do not drink alcohol until your doctor says you can. Watch your symptoms and tell other people to do the same. Other problems can occur after a concussion. Tell your work Freight forwarder, teachers, Government social research officer, school counselor, coach, or Product/process development scientist about your injury and symptoms. Tell them about what you can or cannot do. See a mental health therapist if you keep feeling worried and nervous or sad. Keep all follow-up visits. Your doctor will check on your recovery and give you a plan for returning to activities. How is this prevented? It is very important that you do not get another brain injury. In rare cases, another injury can cause brain damage that will not go away, brain swelling, or death. The risk of this  is greatest in the first 7-10 days after a head injury. To avoid injuries: Stop activities that could lead to a second concussion, such as contact sports, until your doctor says it is okay. When you return to sports or activities: Do not crash into other players. This is how most concussions happen. Follow the rules. Respect other players. Do not engage in violent  behavior while playing. Get regular exercise. Do strength and balance training. Wear a helmet that fits you well during sports, biking, or other activities. Helmets can help protect you from serious skull and brain injuries, but they may not protect you from a concussion. Even when wearing a helmet, you should avoid being hit in the head. Where to find more information Centers for Disease Control and Prevention: [employer] Contact a doctor if: Your symptoms do not get better or get worse. You have new symptoms. You have another injury. Your balance gets worse. You have changes in how you act. Get help right away if: You have very bad headaches or your headaches get worse. You have any of these problems: Feeling weak or numb in any part of your body. Slurred speech. Changes in how you see (vision). Feeling mixed up (confused). You vomit often. You faint or other people have trouble waking you up. You have a seizure. These symptoms may be an emergency. Get help right away. Call 911. Do not wait to see if the symptoms will go away. Do not drive yourself to the hospital. Also, get help right away if: You have thoughts of hurting yourself or others. Take one of these steps if you feel like you may hurt yourself or others, or have thoughts about taking your own life: Go to your nearest emergency room. Call 911. Call the [facility] at [phone] or [phone]. This is open 24 hours a day. Text the Crisis Text Line at (321)016-4447. This information is not intended to replace advice given to you by your health care provider. Make sure you discuss any questions you have with your health care provider. Document Revised: 10/06/2021 Document Reviewed: 10/06/2021 Elsevier Patient Education  [place].

## 2022-06-25 NOTE — Assessment & Plan Note (Signed)
Clinically stable.  However still having persistent lingering symptoms affecting quality of life. Recommend brain CT scan as soon as possible and also neurology evaluation.  Referral placed today. May take Tylenol for headaches as needed. ED precautions given. Follow-up after neurology evaluation
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
## 2022-06-26 ENCOUNTER — Other Ambulatory Visit: Payer: BC Managed Care – PPO

## 2022-06-26 ENCOUNTER — Ambulatory Visit
Admission: RE | Admit: 2022-06-26 | Discharge: 2022-06-26 | Disposition: A | Discharge status: Home / Self Care | Payer: BC Managed Care – PPO | Source: Ambulatory Visit | Attending: Emergency Medicine | Admitting: Emergency Medicine

## 2022-06-26 DIAGNOSIS — S060X1A Concussion with loss of consciousness of 30 minutes or less, initial encounter: Secondary | ICD-10-CM

## 2022-06-28 ENCOUNTER — Ambulatory Visit (INDEPENDENT_AMBULATORY_CARE_PROVIDER_SITE_OTHER): Payer: BC Managed Care – PPO

## 2022-06-28 ENCOUNTER — Ambulatory Visit (INDEPENDENT_AMBULATORY_CARE_PROVIDER_SITE_OTHER): Payer: BC Managed Care – PPO | Admitting: Family Medicine

## 2022-06-28 VITALS — BP 132/86 | HR 65 | Ht 74.0 in | Wt 228.0 lb

## 2022-06-28 DIAGNOSIS — S060X1A Concussion with loss of consciousness of 30 minutes or less, initial encounter: Secondary | ICD-10-CM | POA: Diagnosis not present

## 2022-06-28 DIAGNOSIS — M542 Cervicalgia: Secondary | ICD-10-CM | POA: Diagnosis not present

## 2022-06-28 MED ORDER — TIZANIDINE HCL 4 MG PO TABS: 4.0000 mg | ORAL_TABLET | Freq: Three times a day (TID) | ORAL | 1 refills | Status: DC | PRN

## 2022-06-28 NOTE — Patient Instructions (Addendum)
Thank you for coming in today.   Please get an Xray today before you leave   I've referred you to Physical Therapy.  Let us know if you don't hear from them in one week.   Use the muscle relaxer as needed.   Recheck in 6 weeks.   Let me know if this is not working or if you have a problem.
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
## 2022-06-29 NOTE — Progress Notes (Signed)
Cervical spine x-ray shows some mild arthritis changes.  No fractures are present.
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
## 2022-07-11 ENCOUNTER — Ambulatory Visit: Payer: BC Managed Care – PPO | Admitting: Physical Therapy

## 2022-07-11 ENCOUNTER — Other Ambulatory Visit: Payer: Self-pay

## 2022-07-11 ENCOUNTER — Encounter: Payer: Self-pay | Admitting: Physical Therapy

## 2022-07-11 DIAGNOSIS — M6281 Muscle weakness (generalized): Secondary | ICD-10-CM

## 2022-07-11 DIAGNOSIS — M542 Cervicalgia: Secondary | ICD-10-CM

## 2022-07-11 DIAGNOSIS — R293 Abnormal posture: Secondary | ICD-10-CM

## 2022-07-18 NOTE — Progress Notes (Signed)
[facility] Progress Note  Chief Complaint:  No chief complaint on file.   Subjective  History: Randy Wilson was last seen by me on 05/03/22. Randy Wilson is here to follow-up his Crohn's disease, last seen for colonoscopy 02/06/2022.  Findings as follows: The perianal and digital rectal examinations were normal. Specifically, no peri-anal Crohn's activity. Findings: - There was evidence of a prior side-to-side ileo-colonic anastomosis in the ascending colon. This was patent and was characterized by inflammation and ulceration. The anastomosis was not traversed. Crohn's activity characterized by edema, polypoid inflammation, multiple ulcerations and stenosis. The anastomosis could not be traversed due to both stenosis and redundant colon/scope looping. Overall, the appearance was similar to the last exam. - Multiple diverticula were found in the left colon. - Internal hemorrhoids were found. - The exam was otherwise without abnormality on direct and retroflexion views.   He had been on Humira monotherapy for 3 years (some periods off the medicine due to noncompliance with treatment and follow-up at times).  Humira level 4.7 with no detectable antibodies on 01/16/2022.   Given colonoscopy findings above, my recommendation was to begin Randy Wilson, though his insurance denied it and he had to start Randy Wilson instead.  That medicine was approved approximately 4 weeks ago, and he had his first infusion.  He tells me that for self-administered treatment is about a week from now.  Recurrent Crohn's in the neoterminal ileum/anastomosis after prior surgery, failed Humira monotherapy at adequate dosing with normal levels and no antibodies.  I have been reluctant to use thiopurine's with him because he was often noncompliant with follow-up and treatment, thus increasing the risk of medication related complications if he is not monitored closely. He has been good lately about attending a preprocedure  office visit, then his colonoscopy, and then today's visit.  So I raised the possibility of needing to add azathioprine to his Randy Wilson in the next 4 to 6 months depending on his response to treatment. Continue self-administered Randy Wilson per usual treatment plan. ________________________________________________________   ***    ROS: Review of Systems   The patient's Past Medical, Family and Social History were reviewed and are on file in the EMR.  Objective:  Med list reviewed  Current Outpatient Medications:    allopurinol (ZYLOPRIM) 100 MG tablet, TAKE 1 TABLET BY MOUTH EVERY DAY, Disp: 90 tablet, Rfl: 3   cetirizine (ZYRTEC) 10 MG tablet, Take 1 tablet (10 mg total) by mouth daily. (Patient taking differently: Take 10 mg by mouth as needed.), Disp: 30 tablet, Rfl: 11   EPINEPHrine 0.3 mg/0.3 mL IJ SOAJ injection, Inject 0.3 mg into the muscle as needed for anaphylaxis., Disp: 1 each, Rfl: 3   ibuprofen (ADVIL) 200 MG tablet, Take 600 mg by mouth every 6 (six) hours as needed. Takes 3 of 200 mg ibuprofen, Disp: , Rfl:    tiZANidine (ZANAFLEX) 4 MG tablet, Take 1 tablet (4 mg total) by mouth every 8 (eight) hours as needed for muscle spasms., Disp: 60 tablet, Rfl: 1   ustekinumab (Randy Wilson) 90 MG/ML SOSY injection, Inject 1 mL (90 mg total) into the skin every 8 (eight) weeks., Disp: 1 mL, Rfl: 5   Vital signs in last 24 hrs: There were no vitals filed for this visit. Wt Readings from Last 3 Encounters:  06/28/22 228 lb (103.4 kg)  06/25/22 228 lb (103.4 kg)  05/03/22 228 lb (103.4 kg)     Physical Exam General: well-appearing *** HEENT: sclera anicteric, oral mucosa moist  without lesions Neck: supple, no thyromegaly, JVD or lymphadenopathy Cardiac: ***,  no peripheral edema Pulm: clear to auscultation bilaterally, normal RR and effort noted Abdomen: soft, *** tenderness, with active bowel sounds. No guarding or palpable hepatosplenomegaly. Skin: warm and dry, no jaundice or  rash  Labs:   ___________________________________________ Radiologic studies:   ____________________________________________ Other:   _____________________________________________ Assessment & Plan   Assessment: No diagnosis found.  ***   Plan: ***   *** minutes were spent on this encounter (including chart review, history/exam, counseling/coordination of care, and documentation) > 50% of that time was spent on counseling and coordination of care.    I,Alexis Herring,acting as a Education administrator for [place], MD.,have documented all relevant documentation on the behalf of Doran Stabler, MD,as directed by  Doran Stabler, MD while in the presence of Doran Stabler, MD.   Eugene Gavia

## 2022-07-19 ENCOUNTER — Encounter: Payer: Self-pay | Admitting: Rehabilitative and Restorative Service Providers"

## 2022-07-19 ENCOUNTER — Ambulatory Visit: Payer: BC Managed Care – PPO | Admitting: Rehabilitative and Restorative Service Providers"

## 2022-07-19 ENCOUNTER — Ambulatory Visit: Payer: BC Managed Care – PPO | Admitting: Gastroenterology

## 2022-07-19 ENCOUNTER — Encounter: Payer: Self-pay | Admitting: Gastroenterology

## 2022-07-19 VITALS — BP 124/82 | HR 76 | Ht 72.75 in | Wt 234.0 lb

## 2022-07-19 DIAGNOSIS — K5000 Crohn's disease of small intestine without complications: Secondary | ICD-10-CM | POA: Diagnosis not present

## 2022-07-19 DIAGNOSIS — M542 Cervicalgia: Secondary | ICD-10-CM

## 2022-07-19 DIAGNOSIS — R293 Abnormal posture: Secondary | ICD-10-CM | POA: Diagnosis not present

## 2022-07-19 DIAGNOSIS — Z7960 Long term (current) use of unspecified immunomodulators and immunosuppressants: Secondary | ICD-10-CM

## 2022-07-19 DIAGNOSIS — M6281 Muscle weakness (generalized): Secondary | ICD-10-CM | POA: Diagnosis not present

## 2022-07-19 NOTE — Therapy (Addendum)
OUTPATIENT PHYSICAL THERAPY TREATMENT DISCHARGE SUMMARY   Patient Name: Randy Wilson MRN: [id number] DOB:Oct 04, 1965, 57 y.o., male Today's Date: 07/19/2022  END OF SESSION:  PT End of Session - 07/19/22 0842     Visit Number 2    Number of Visits 16    Date for PT Re-Evaluation 09/05/22    Authorization Type BLUE CROSS BLUE [employer]IELD    PT Start Time [id number]    PT Stop Time 0918    PT Time Calculation (min) 39 min    Activity Tolerance Patient tolerated treatment well    Behavior During Therapy [facility] for tasks assessed/performed              Past Medical History:  Diagnosis Date   Crohn's disease of small intestine ([facility]) 2017   followed by dr h. danis;  s/p colon resection 11-02-2015,  on humira   History of asthma    child   History of colon polyps    History of COVID-19 05/30/2020   positive result in epicrunny nose/cough x 3 weeks all symptoms resolved   History of gout    last flare up 1 year ago per pt on 04-03-2021   OA (osteoarthritis)    Right wrist fracture    fell off ladder per pt on 02-08-2021   Right wrist fracture, closed, initial encounter 01/22/2021   Past Surgical History:  Procedure Laterality Date   ANKLE SURGERY Left 2002   bone spurs removed   COLONOSCOPY     last one 02-25-2020  by dr h. danis   HARDWARE REMOVAL Right 04/06/2021   Procedure: Right wrist removal of hardware;  Surgeon: [name], MD;  Location: [facility];  Service: Orthopedics;  Laterality: Right;  with local anesthesia Needs 30 minutes   LAPAROSCOPIC ILEOCECECTOMY N/A 11/02/2015   Procedure: LAPAROSCOPIC ILEOCECECTOMY;  Surgeon: [name], MD;  Location[facility];  Service: General;  Laterality: N/A;   ORIF WRIST FRACTURE Right 02/09/2021   Procedure: OPEN REDUCTION INTERNAL FIXATION (ORIF) WRIST FRACTURE;  Surgeon: [name], MD;  Location: [facility];  Service: Orthopedics;  Laterality: Right;  WITH REGIONAL   SHOULDER ARTHROSCOPY W/  SUBACROMIAL DECOMPRESSION AND DISTAL CLAVICLE EXCISION Left 05/07/2008   @[facility]  by dr [name];   bone spurs removed   Patient Active Problem List   Diagnosis Date Noted   Concussion wth loss of consciousness of 30 minutes or less 06/25/2022   Dyslipidemia 03/08/2022   Crohn's disease of small intestine ([facility]) 03/01/2022   Residual hemorrhoidal skin tags 03/31/2020   Prediabetes 01/20/2020   Erectile dysfunction 01/20/2020   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   Asthma 04/24/2015   Hx of colonic polyp 11/08/2014   History of colonic polyps 09/17/2014    REFERRING PROVIDER: Rodolph Bong, MD  REFERRING DIAG: Neck pain [M54.2]   THERAPY DIAG:  Neck pain  Abnormal posture  Muscle weakness (generalized)  Rationale for Evaluation and Treatment: Rehabilitation  ONSET DATE: 14th of January 2024  SUBJECTIVE:  SUBJECTIVE STATEMENT: He indicated stiffness in neck and pain with turning head this morning.    PERTINENT HISTORY:  Crohn's disease, hx of asthma, OA  PAIN:  NPRS scale: current 7/10.  Pain location: Central posterior cervical, Rt upper trap  Pain description: Achy constant pain. Feels like neck needs to be popped all the time.  Aggravating factors: Laying down propped on a pillow Relieving factors: laying on a flat surface, tylenol   PRECAUTIONS: None  WEIGHT BEARING RESTRICTIONS: No  FALLS:  Has patient fallen in last 6 months? No  LIVING ENVIRONMENT: Lives with: lives with their spouse Lives in: House/apartment Stairs: Yes: Internal: 12 steps; on right going up Has following equipment at home: None  OCCUPATION: Maintenance at [employer]   PLOF: Independent  PATIENT GOALS: Pt would like to reduce neck pain.   OBJECTIVE:   DIAGNOSTIC FINDINGS:   07/11/2022 No acute intracranial pathology.   PATIENT SURVEYS:  07/11/2022 FOTO 43.85%, 65% in 11 visits predicted  COGNITION: 07/11/2022 Overall cognitive status: Within functional limits for tasks assessed  SENSATION: 07/11/2022 [facility]  POSTURE:  07/11/2022 rounded shoulders and forward head  PALPATION: 07/11/2022 TTP throughout cervical paraspinals.    CERVICAL ROM:   Active ROM AROM (deg) 07/11/2022 AROM 07/19/2022  Flexion 40 P! 54  Extension 30 P! 52  Right lateral flexion 28 P!   Left lateral flexion 32    Right rotation 34 P! 50  Left rotation 85    (Blank rows = not tested)  UPPER EXTREMITY ROM:  Active ROM Right 07/11/2022 Left 07/11/2022  Shoulder flexion WFL uncomfortable at end range  [facility]  Shoulder internal rotation [facility]  Shoulder external rotation [facility] WFL   (Blank rows = not tested)  UPPER EXTREMITY MMT:  MMT Right 07/11/2022 Left 07/11/2022  Shoulder flexion 4+ 4+  Shoulder internal rotation 5 5  Shoulder external rotation 4+ 5  Middle trapezius 4+ 4+  Lower trapezius 4 4+   (Blank rows = not tested)  TODAY'S TREATMENT:                                                            DATE: 07/19/2022 Therex: Rt upper trap self stretch 15 sec x 5 with moist heat Rt levator scap stretch 15 sec x 3 with moist heat Standing blue band rows bilateral c scap retraction focus 2 x 15 Standing blue band GH ext 2 x 15 UBE UE only fwd/back 4 mins each way lvl 3.0  Education on HEP.   Manual: Compression to Rt upper trap, skilled palpation with dry needling  Trigger Point Dry-Needling  Treatment instructions: Expect mild to moderate muscle soreness. S/S of pneumothorax if dry needled over a lung field, and to seek immediate medical attention should they occur. Patient verbalized understanding of these instructions and education.  Patient Consent Given: Yes Education handout provided: verbally Muscles treated: Rt upper trap Treatment response/outcome:  concordant local symptoms c twitch response.  No adverse reactions observed.       TODAY'S TREATMENT:  DATE: 07/11/2022 Creating, reviewing, and completing below HEP    PATIENT EDUCATION:  07/19/2022 Education details: HEP update, DN Person educated: Patient Education method: Medical illustrator, handout Education comprehension: verbalized understanding and returned demonstration  HOME EXERCISE PROGRAM: Access Code: [email or web address] URL: [email or web address] Date: 07/19/2022 Prepared by: Chyrel Masson  Exercises - Seated Scapular Retraction  - 2-3 x daily - 7 x weekly - 1 sets - 5-10 reps - 2 hold - Supine Chin Tuck  - 1-2 x daily - 7 x weekly - 1 sets - 10 reps - 2 hold - Seated Upper Trapezius Stretch  - 2-3 x daily - 7 x weekly - 1 sets - 3-5 reps - 15-30 hold - Seated Levator Scapulae Stretch (Mirrored)  - 2-3 x daily - 7 x weekly - 1 sets - 3-5 reps - 15-30 hold - Seated Assisted Cervical Rotation with Towel  - 2-3 x daily - 7 x weekly - 1 sets - 10 reps - 2 hold - Standing Shoulder Row with Anchored Resistance  - 1-2 x daily - 7 x weekly - 1-2 sets - 10-15 reps - Shoulder Extension with Resistance  - 1-2 x daily - 7 x weekly - 1-2 sets - 10-15 reps  ASSESSMENT:  CLINICAL IMPRESSION:  Localized response noted from trigger point treatment.  Improvements were noted in cervical mobility and symptoms reduction in mobility as rechecked today.  Continued skilled PT services indicated at this time.    OBJECTIVE IMPAIRMENTS: decreased activity tolerance, decreased shoulder mobility, decreased ROM, decreased strength, impaired flexibility, impaired UE use, postural dysfunction, and pain.  ACTIVITY LIMITATIONS: reaching, lifting, carry,  cleaning, driving, and or occupation  PERSONAL FACTORS: Crohn's disease, hx of asthma, OA also affecting patient's functional outcome.  REHAB POTENTIAL: Good  CLINICAL  DECISION MAKING: Stable/uncomplicated  EVALUATION COMPLEXITY: Low    GOALS: Short term PT Goals Target date: 07/25/2022 Pt will be I and compliant with HEP. Baseline:  Goal status: on going 07/19/2022 Pt will decrease pain by 25% overall Baseline: Goal status: on going 07/19/2022  Long term PT goals Target date: 09/05/2022 Pt will improve Rt shoulder strength to 5/5 MMT to improve functional strength for work and recreational requirements.  Baseline: Goal status: New Pt will improve FOTO to at least 65% functional to show improved function Baseline: Goal status: New Pt will reduce pain to overall less than 3/10 with usual activity and work activity. Baseline: Goal status: New       4. Pt will improve cervical ROM to [facility] without reports of pain.   Baseline:   Goal Status: New   PLAN: PT FREQUENCY: 2x per week  PT DURATION: 8 weeks   PLANNED INTERVENTIONS (unless contraindicated): aquatic PT, Canalith repositioning, cryotherapy, Electrical stimulation, Iontophoresis with 4 mg/ml dexamethasome, Moist heat, traction, Ultrasound, gait training, Therapeutic exercise, balance training, neuromuscular re-education, patient/family education, prosthetic training, manual techniques, passive ROM, dry needling, taping, vasopnuematic device, vestibular, spinal manipulations, joint manipulations  PLAN FOR NEXT SESSION: Needling as desired.  Continued postural strengthening.     Chyrel Masson, PT, DPT, OCS, ATC 07/19/22  9:19 AM     PHYSICAL THERAPY DISCHARGE SUMMARY  Visits from Start of Care: 2  Current functional level related to goals / functional outcomes: SEE ABOVE   Remaining deficits: See above   Education / Equipment: HEP   Patient agrees to discharge. Patient goals were not met. Patient is being discharged due to not returning since the last visit.  Clarita Crane, PT, DPT 09/05/22  11:29 AM  [facility] [address] [place], [place], [address] Phone: [phone]   Fax:  [phone]

## 2022-07-19 NOTE — Patient Instructions (Addendum)
_______________________________________________________  If your blood pressure at your visit was 140/90 or greater, please contact your primary care physician to follow up on this.  _______________________________________________________  If you are age 59 or older, your body mass index should be between 23-30. Your Body mass index is 31.09 kg/m. If this is out of the aforementioned range listed, please consider follow up with your Primary Care Provider.  If you are age 35 or younger, your body mass index should be between 19-25. Your Body mass index is 31.09 kg/m. If this is out of the aformentioned range listed, please consider follow up with your Primary Care Provider.   ________________________________________________________  The [facility] providers would like to encourage you to use [facility] to communicate with providers for non-urgent requests or questions.  Due to long hold times on the telephone, sending your provider a message by [facility] may be a faster and more efficient way to get a response.  Please allow 48 business hours for a response.  Please remember that this is for non-urgent requests.  _______________________________________________________  Follow up in 4 months   It was a pleasure to see you today!  Thank you for trusting me with your gastrointestinal care!

## 2022-07-23 ENCOUNTER — Encounter: Payer: BC Managed Care – PPO | Admitting: Rehabilitative and Restorative Service Providers"

## 2022-07-23 NOTE — Therapy (Incomplete)
OUTPATIENT PHYSICAL THERAPY TREATMENT   Patient Name: Randy Wilson MRN: [id number] DOB:08-Jul-1965, 57 y.o., male Today's Date: 07/23/2022  END OF SESSION:     Past Medical History:  Diagnosis Date   Crohn's disease of small intestine ([place]) 2017   followed by dr h. danis;  s/p colon resection 11-02-2015,  on humira   History of asthma    child   History of colon polyps    History of COVID-19 05/30/2020   positive result in epicrunny nose/cough x 3 weeks all symptoms resolved   History of gout    last flare up 1 year ago per pt on 04-03-2021   OA (osteoarthritis)    Right wrist fracture    fell off ladder per pt on 02-08-2021   Right wrist fracture, closed, initial encounter 01/22/2021   Past Surgical History:  Procedure Laterality Date   ANKLE SURGERY Left 2002   bone spurs removed   COLONOSCOPY     last one 02-25-2020  by dr h. danis   HARDWARE REMOVAL Right 04/06/2021   Procedure: Right wrist removal of hardware;  Surgeon: [name], MD;  Location: [place];  Service: Orthopedics;  Laterality: Right;  with local anesthesia Needs 30 minutes   LAPAROSCOPIC ILEOCECECTOMY N/A 11/02/2015   Procedure: LAPAROSCOPIC ILEOCECECTOMY;  Surgeon: Leighton Ruff, MD;  Location: [facility];  Service: General;  Laterality: N/A;   ORIF WRIST FRACTURE Right 02/09/2021   Procedure: OPEN REDUCTION INTERNAL FIXATION (ORIF) WRIST FRACTURE;  Surgeon: [name], MD;  Location: [place];  Service: Orthopedics;  Laterality: Right;  WITH REGIONAL   SHOULDER ARTHROSCOPY W/ SUBACROMIAL DECOMPRESSION AND DISTAL CLAVICLE EXCISION Left 05/07/2008   '@[facility]'$  by dr Berenice Primas;   bone spurs removed   Patient Active Problem List   Diagnosis Date Noted   Concussion wth loss of consciousness of 30 minutes or less 06/25/2022   Dyslipidemia 03/08/2022   Crohn's disease of small intestine ([place]) 03/01/2022   Residual hemorrhoidal skin tags 03/31/2020   Prediabetes 01/20/2020    Erectile dysfunction 01/20/2020   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  Rationale for Evaluation and Treatment: Rehabilitation  ONSET DATE: 14th of January 2024  SUBJECTIVE:  SUBJECTIVE STATEMENT: He indicated stiffness in neck and pain with turning head this morning.    PERTINENT HISTORY:  Crohn's disease, hx of asthma, OA  PAIN:  NPRS scale: current 7/10.  Pain location: Central posterior cervical, Rt upper trap  Pain description: Achy constant pain. Feels like neck needs to be popped all the time.  Aggravating factors: Laying down propped on a pillow Relieving factors: laying on a flat surface, tylenol   PRECAUTIONS: None  WEIGHT BEARING RESTRICTIONS: No  FALLS:  Has patient fallen in last 6 months? No  LIVING ENVIRONMENT: Lives with: lives with their spouse Lives in: House/apartment Stairs: Yes: Internal: 12 steps; on right going up Has following equipment at home: None  OCCUPATION: Maintenance at [employer]   PLOF: Independent  PATIENT GOALS: Pt would like to reduce neck pain.   OBJECTIVE:   DIAGNOSTIC FINDINGS:  07/11/2022 No acute intracranial pathology.   PATIENT SURVEYS:  07/11/2022 FOTO 43.85%, 65% in 11 visits predicted  COGNITION: 07/11/2022 Overall cognitive status: Within functional limits for tasks assessed  SENSATION: 07/11/2022 [facility]  POSTURE:  07/11/2022 rounded shoulders and forward head  PALPATION: 07/11/2022 TTP throughout cervical paraspinals.    CERVICAL ROM:   Active ROM AROM (deg) 07/11/2022 AROM 07/19/2022  Flexion 40 P!  54  Extension 30 P! 52  Right lateral flexion 28 P!   Left lateral flexion 32    Right rotation 34 P! 50  Left rotation 85    (Blank rows = not tested)  UPPER EXTREMITY ROM:  Active ROM Right 07/11/2022 Left 07/11/2022  Shoulder flexion WFL uncomfortable at end range  [facility]  Shoulder internal rotation [facility]  Shoulder external rotation [facility] WFL   (Blank rows = not tested)  UPPER EXTREMITY MMT:  MMT Right 07/11/2022 Left 07/11/2022  Shoulder flexion 4+ 4+  Shoulder internal rotation 5 5  Shoulder external rotation 4+ 5  Middle trapezius 4+ 4+  Lower trapezius 4 4+   (Blank rows = not tested)  TODAY'S TREATMENT:                                                            DATE: 07/23/2022 Therex: Rt upper trap self stretch 15 sec x 5 with moist heat Rt levator scap stretch 15 sec x 3 with moist heat Standing blue band rows bilateral c scap retraction focus 2 x 15 Standing blue band GH ext 2 x 15 UBE UE only fwd/back 4 mins each way lvl 3.0  Education on HEP.   Manual: Compression to Rt upper trap, skilled palpation with dry needling  Trigger Point Dry-Needling  Treatment instructions: Expect mild to moderate muscle soreness. S/S of pneumothorax if dry needled over a lung field, and to seek immediate medical attention should they occur. Patient verbalized understanding of these instructions and education.  Patient Consent Given: Yes Education handout provided: verbally Muscles treated: Rt upper trap Treatment response/outcome: concordant local symptoms c twitch response.  No adverse reactions observed.   TODAY'S TREATMENT:  DATE: 07/19/2022 Therex: Rt upper trap self stretch 15 sec x 5 with moist heat Rt levator scap stretch 15 sec x 3 with moist heat Standing blue band rows bilateral c scap retraction focus 2 x 15 Standing blue band GH ext 2 x 15 UBE UE only fwd/back 4 mins each way lvl 3.0  Education on HEP.    Manual: Compression to Rt upper trap, skilled palpation with dry needling  Trigger Point Dry-Needling  Treatment instructions: Expect mild to moderate muscle soreness. S/S of pneumothorax if dry needled over a lung field, and to seek immediate medical attention should they occur. Patient verbalized understanding of these instructions and education.  Patient Consent Given: Yes Education handout provided: verbally Muscles treated: Rt upper trap Treatment response/outcome: concordant local symptoms c twitch response.  No adverse reactions observed.   TODAY'S TREATMENT:                                                            DATE: 07/11/2022 Creating, reviewing, and completing below HEP    PATIENT EDUCATION:  07/19/2022 Education details: HEP update, DN Person educated: Patient Education method: Customer service manager, handout Education comprehension: verbalized understanding and returned demonstration  HOME EXERCISE PROGRAM: Access Code: [email or web address] URL: [email or web address] Date: 07/19/2022 Prepared by: Scot Jun  Exercises - Seated Scapular Retraction  - 2-3 x daily - 7 x weekly - 1 sets - 5-10 reps - 2 hold - Supine Chin Tuck  - 1-2 x daily - 7 x weekly - 1 sets - 10 reps - 2 hold - Seated Upper Trapezius Stretch  - 2-3 x daily - 7 x weekly - 1 sets - 3-5 reps - 15-30 hold - Seated Levator Scapulae Stretch (Mirrored)  - 2-3 x daily - 7 x weekly - 1 sets - 3-5 reps - 15-30 hold - Seated Assisted Cervical Rotation with Towel  - 2-3 x daily - 7 x weekly - 1 sets - 10 reps - 2 hold - Standing Shoulder Row with Anchored Resistance  - 1-2 x daily - 7 x weekly - 1-2 sets - 10-15 reps - Shoulder Extension with Resistance  - 1-2 x daily - 7 x weekly - 1-2 sets - 10-15 reps  ASSESSMENT:  CLINICAL IMPRESSION:  Localized response noted from trigger point treatment.  Improvements were noted in cervical mobility and symptoms reduction in mobility as rechecked  today.  Continued skilled PT services indicated at this time.    OBJECTIVE IMPAIRMENTS: decreased activity tolerance, decreased shoulder mobility, decreased ROM, decreased strength, impaired flexibility, impaired UE use, postural dysfunction, and pain.  ACTIVITY LIMITATIONS: reaching, lifting, carry,  cleaning, driving, and or occupation  PERSONAL FACTORS: Crohn's disease, hx of asthma, OA also affecting patient's functional outcome.  REHAB POTENTIAL: Good  CLINICAL DECISION MAKING: Stable/uncomplicated  EVALUATION COMPLEXITY: Low    GOALS: Short term PT Goals Target date: 07/25/2022 Pt will be I and compliant with HEP. Baseline:  Goal status: on going 07/19/2022 Pt will decrease pain by 25% overall Baseline: Goal status: on going 07/19/2022  Long term PT goals Target date: 09/05/2022 Pt will improve Rt shoulder strength to 5/5 MMT to improve functional strength for work and recreational requirements.  Baseline: Goal status: New Pt will improve FOTO to at least 65% functional to show improved function Baseline:  Goal status: New Pt will reduce pain to overall less than 3/10 with usual activity and work activity. Baseline: Goal status: New       4. Pt will improve cervical ROM to [facility] without reports of pain.   Baseline:   Goal Status: New   PLAN: PT FREQUENCY: 2x per week  PT DURATION: 8 weeks   PLANNED INTERVENTIONS (unless contraindicated): aquatic PT, Canalith repositioning, cryotherapy, Electrical stimulation, Iontophoresis with 4 mg/ml dexamethasome, Moist heat, traction, Ultrasound, gait training, Therapeutic exercise, balance training, neuromuscular re-education, patient/family education, prosthetic training, manual techniques, passive ROM, dry needling, taping, vasopnuematic device, vestibular, spinal manipulations, joint manipulations  PLAN FOR NEXT SESSION: Needling as desired.  Continued postural strengthening.    Scot Jun, PT, DPT, OCS, ATC 07/23/22   7:52 AM

## 2022-07-26 ENCOUNTER — Encounter: Payer: BC Managed Care – PPO | Admitting: Rehabilitative and Restorative Service Providers"

## 2022-07-26 ENCOUNTER — Telehealth: Payer: Self-pay | Admitting: Rehabilitative and Restorative Service Providers"

## 2022-07-26 NOTE — Therapy (Incomplete)
OUTPATIENT PHYSICAL THERAPY TREATMENT   Patient Name: Randy Wilson MRN: [id number] DOB:[birth year], 57 y.o., male Today's Date: 07/26/2022  END OF SESSION:     Past Medical History:  Diagnosis Date   Crohn's disease of small intestine ([place]) 2017   followed by dr h. danis;  s/p colon resection 11-02-2015,  on humira   History of asthma    child   History of colon polyps    History of COVID-19 05/30/2020   positive result in epicrunny nose/cough x 3 weeks all symptoms resolved   History of gout    last flare up 1 year ago per pt on 04-03-2021   OA (osteoarthritis)    Right wrist fracture    fell off ladder per pt on 02-08-2021   Right wrist fracture, closed, initial encounter 01/22/2021   Past Surgical History:  Procedure Laterality Date   ANKLE SURGERY Left 2002   bone spurs removed   COLONOSCOPY     last one 02-25-2020  by dr h. danis   HARDWARE REMOVAL Right 04/06/2021   Procedure: Right wrist removal of hardware;  Surgeon: [name], MD;  Location: [place];  Service: Orthopedics;  Laterality: Right;  with local anesthesia Needs 30 minutes   LAPAROSCOPIC ILEOCECECTOMY N/A 11/02/2015   Procedure: LAPAROSCOPIC ILEOCECECTOMY;  Surgeon: Leighton Ruff, MD;  [facility];  Service: General;  Laterality: N/A;   ORIF WRIST FRACTURE Right 02/09/2021   Procedure: OPEN REDUCTION INTERNAL FIXATION (ORIF) WRIST FRACTURE;  Surgeon: [name], MD;  Location: [place];  Service: Orthopedics;  Laterality: Right;  WITH REGIONAL   SHOULDER ARTHROSCOPY W/ SUBACROMIAL DECOMPRESSION AND DISTAL CLAVICLE EXCISION Left 05/07/2008   '@[facility]'$  by dr Berenice Primas;   bone spurs removed   Patient Active Problem List   Diagnosis Date Noted   Concussion wth loss of consciousness of 30 minutes or less 06/25/2022   Dyslipidemia 03/08/2022   Crohn's disease of small intestine ([place]) 03/01/2022   Residual hemorrhoidal skin tags 03/31/2020   Prediabetes 01/20/2020    Erectile dysfunction 01/20/2020   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  Rationale for Evaluation and Treatment: Rehabilitation  ONSET DATE: 14th of January 2024  SUBJECTIVE:  SUBJECTIVE STATEMENT: He indicated stiffness in neck and pain with turning head this morning.    PERTINENT HISTORY:  Crohn's disease, hx of asthma, OA  PAIN:  NPRS scale: current 7/10.  Pain location: Central posterior cervical, Rt upper trap  Pain description: Achy constant pain. Feels like neck needs to be popped all the time.  Aggravating factors: Laying down propped on a pillow Relieving factors: laying on a flat surface, tylenol   PRECAUTIONS: None  WEIGHT BEARING RESTRICTIONS: No  FALLS:  Has patient fallen in last 6 months? No  LIVING ENVIRONMENT: Lives with: lives with their spouse Lives in: House/apartment Stairs: Yes: Internal: 12 steps; on right going up Has following equipment at home: None  OCCUPATION: Maintenance at [employer]   PLOF: Independent  PATIENT GOALS: Pt would like to reduce neck pain.   OBJECTIVE:   DIAGNOSTIC FINDINGS:  07/11/2022 No acute intracranial pathology.   PATIENT SURVEYS:  07/11/2022 FOTO 43.85%, 65% in 11 visits predicted  COGNITION: 07/11/2022 Overall cognitive status: Within functional limits for tasks assessed  SENSATION: 07/11/2022 [facility]  POSTURE:  07/11/2022 rounded shoulders and forward head  PALPATION: 07/11/2022 TTP throughout cervical paraspinals.    CERVICAL ROM:   Active ROM AROM (deg) 07/11/2022 AROM 07/19/2022  Flexion 40 P!  54  Extension 30 P! 52  Right lateral flexion 28 P!   Left lateral flexion 32    Right rotation 34 P! 50  Left rotation 85    (Blank rows = not tested)  UPPER EXTREMITY ROM:  Active ROM Right 07/11/2022 Left 07/11/2022  Shoulder flexion WFL uncomfortable at end range  [facility]  Shoulder internal rotation [facility]  Shoulder external rotation [facility] WFL   (Blank rows = not tested)  UPPER EXTREMITY MMT:  MMT Right 07/11/2022 Left 07/11/2022  Shoulder flexion 4+ 4+  Shoulder internal rotation 5 5  Shoulder external rotation 4+ 5  Middle trapezius 4+ 4+  Lower trapezius 4 4+   (Blank rows = not tested)  TODAY'S TREATMENT:                                                            DATE: 07/26/2022 Therex: Rt upper trap self stretch 15 sec x 5 with moist heat Rt levator scap stretch 15 sec x 3 with moist heat Standing blue band rows bilateral c scap retraction focus 2 x 15 Standing blue band GH ext 2 x 15 UBE UE only fwd/back 4 mins each way lvl 3.0  Education on HEP.   Manual: Compression to Rt upper trap, skilled palpation with dry needling  Trigger Point Dry-Needling  Treatment instructions: Expect mild to moderate muscle soreness. S/S of pneumothorax if dry needled over a lung field, and to seek immediate medical attention should they occur. Patient verbalized understanding of these instructions and education.  Patient Consent Given: Yes Education handout provided: verbally Muscles treated: Rt upper trap Treatment response/outcome: concordant local symptoms c twitch response.  No adverse reactions observed.   TODAY'S TREATMENT:  DATE: 07/23/2022 Therex: Rt upper trap self stretch 15 sec x 5 with moist heat Rt levator scap stretch 15 sec x 3 with moist heat Standing blue band rows bilateral c scap retraction focus 2 x 15 Standing blue band GH ext 2 x 15 UBE UE only fwd/back 4 mins each way lvl 3.0  Education on HEP.    Manual: Compression to Rt upper trap, skilled palpation with dry needling  Trigger Point Dry-Needling  Treatment instructions: Expect mild to moderate muscle soreness. S/S of pneumothorax if dry needled over a lung field, and to seek immediate medical attention should they occur. Patient verbalized understanding of these instructions and education.  Patient Consent Given: Yes Education handout provided: verbally Muscles treated: Rt upper trap Treatment response/outcome: concordant local symptoms c twitch response.  No adverse reactions observed.   TODAY'S TREATMENT:                                                            DATE: 07/19/2022 Therex: Rt upper trap self stretch 15 sec x 5 with moist heat Rt levator scap stretch 15 sec x 3 with moist heat Standing blue band rows bilateral c scap retraction focus 2 x 15 Standing blue band GH ext 2 x 15 UBE UE only fwd/back 4 mins each way lvl 3.0  Education on HEP.   Manual: Compression to Rt upper trap, skilled palpation with dry needling  Trigger Point Dry-Needling  Treatment instructions: Expect mild to moderate muscle soreness. S/S of pneumothorax if dry needled over a lung field, and to seek immediate medical attention should they occur. Patient verbalized understanding of these instructions and education.  Patient Consent Given: Yes Education handout provided: verbally Muscles treated: Rt upper trap Treatment response/outcome: concordant local symptoms c twitch response.  No adverse reactions observed.   TODAY'S TREATMENT:                                                            DATE: 07/11/2022 Creating, reviewing, and completing below HEP    PATIENT EDUCATION:  07/19/2022 Education details: HEP update, DN Person educated: Patient Education method: Customer service manager, handout Education comprehension: verbalized understanding and returned demonstration  HOME EXERCISE PROGRAM: Access Code: [email or web address] URL:  [email or web address] Date: 07/19/2022 Prepared by: Scot Jun  Exercises - Seated Scapular Retraction  - 2-3 x daily - 7 x weekly - 1 sets - 5-10 reps - 2 hold - Supine Chin Tuck  - 1-2 x daily - 7 x weekly - 1 sets - 10 reps - 2 hold - Seated Upper Trapezius Stretch  - 2-3 x daily - 7 x weekly - 1 sets - 3-5 reps - 15-30 hold - Seated Levator Scapulae Stretch (Mirrored)  - 2-3 x daily - 7 x weekly - 1 sets - 3-5 reps - 15-30 hold - Seated Assisted Cervical Rotation with Towel  - 2-3 x daily - 7 x weekly - 1 sets - 10 reps - 2 hold - Standing Shoulder Row with Anchored Resistance  - 1-2 x daily - 7 x weekly - 1-2 sets - 10-15 reps -  Shoulder Extension with Resistance  - 1-2 x daily - 7 x weekly - 1-2 sets - 10-15 reps  ASSESSMENT:  CLINICAL IMPRESSION:  Localized response noted from trigger point treatment.  Improvements were noted in cervical mobility and symptoms reduction in mobility as rechecked today.  Continued skilled PT services indicated at this time.    OBJECTIVE IMPAIRMENTS: decreased activity tolerance, decreased shoulder mobility, decreased ROM, decreased strength, impaired flexibility, impaired UE use, postural dysfunction, and pain.  ACTIVITY LIMITATIONS: reaching, lifting, carry,  cleaning, driving, and or occupation  PERSONAL FACTORS: Crohn's disease, hx of asthma, OA also affecting patient's functional outcome.  REHAB POTENTIAL: Good  CLINICAL DECISION MAKING: Stable/uncomplicated  EVALUATION COMPLEXITY: Low    GOALS: Short term PT Goals Target date: 07/25/2022 Pt will be I and compliant with HEP. Baseline:  Goal status: on going 07/19/2022 Pt will decrease pain by 25% overall Baseline: Goal status: on going 07/19/2022  Long term PT goals Target date: 09/05/2022 Pt will improve Rt shoulder strength to 5/5 MMT to improve functional strength for work and recreational requirements.  Baseline: Goal status: New Pt will improve FOTO to at  least 65% functional to show improved function Baseline: Goal status: New Pt will reduce pain to overall less than 3/10 with usual activity and work activity. Baseline: Goal status: New       4. Pt will improve cervical ROM to [facility] without reports of pain.   Baseline:   Goal Status: New   PLAN: PT FREQUENCY: 2x per week  PT DURATION: 8 weeks   PLANNED INTERVENTIONS (unless contraindicated): aquatic PT, Canalith repositioning, cryotherapy, Electrical stimulation, Iontophoresis with 4 mg/ml dexamethasome, Moist heat, traction, Ultrasound, gait training, Therapeutic exercise, balance training, neuromuscular re-education, patient/family education, prosthetic training, manual techniques, passive ROM, dry needling, taping, vasopnuematic device, vestibular, spinal manipulations, joint manipulations  PLAN FOR NEXT SESSION: Needling as desired.  Continued postural strengthening.    Scot Jun, PT, DPT, OCS, ATC 07/26/22  7:50 AM

## 2022-07-26 NOTE — Telephone Encounter (Signed)
Called patient after 15 mins no show for appointment today.  Left message and reminder of next appointment time, as well as call back number.    Scot Jun, PT, DPT, OCS, ATC 07/26/22  10:32 AM

## 2022-07-30 ENCOUNTER — Telehealth: Payer: Self-pay | Admitting: Rehabilitative and Restorative Service Providers"

## 2022-07-30 ENCOUNTER — Encounter: Payer: BC Managed Care – PPO | Admitting: Rehabilitative and Restorative Service Providers"

## 2022-07-30 NOTE — Telephone Encounter (Signed)
Called patient after 15 mins no show for appointment today.  Unable to leave message.  Per no show policy(has 2 now), left one remaining visit and cancelled others.  Will discharge case if no attendance next visit  Scot Jun, PT, DPT, OCS, ATC 07/30/22  9:54 AM

## 2022-08-01 ENCOUNTER — Ambulatory Visit: Payer: BC Managed Care – PPO | Admitting: Emergency Medicine

## 2022-08-01 ENCOUNTER — Ambulatory Visit (INDEPENDENT_AMBULATORY_CARE_PROVIDER_SITE_OTHER): Payer: BC Managed Care – PPO

## 2022-08-01 ENCOUNTER — Encounter: Payer: Self-pay | Admitting: Emergency Medicine

## 2022-08-01 VITALS — BP 140/88 | HR 70 | Temp 97.9°F | Ht 72.0 in | Wt 233.0 lb

## 2022-08-01 DIAGNOSIS — M25562 Pain in left knee: Secondary | ICD-10-CM

## 2022-08-01 MED ORDER — DICLOFENAC SODIUM 75 MG PO TBEC: 75.0000 mg | DELAYED_RELEASE_TABLET | Freq: Two times a day (BID) | ORAL | 0 refills | Status: DC

## 2022-08-01 NOTE — Patient Instructions (Signed)
Acute Knee Pain, Adult Many things can cause knee pain. Sometimes, knee pain is sudden (acute) and may be caused by damage, swelling, or irritation of the muscles and tissues that support your knee. The pain often goes away on its own with time and rest. If the pain does not go away, tests may be done to find out what is causing the pain. Follow these instructions at home: If you have a knee sleeve or brace:  Wear the knee sleeve or brace as told by your doctor. Take it off only as told by your doctor. Loosen it if your toes: Tingle. Become numb. Turn cold and blue. Keep it clean. If the knee sleeve or brace is not waterproof: Do not let it get wet. Cover it with a watertight covering when you take a bath or shower. Activity Rest your knee. Do not do things that cause pain or make pain worse. Avoid activities where both feet leave the ground at the same time (high-impact activities). Examples are running, jumping rope, and doing jumping jacks. Work with a physical therapist to make a safe exercise program, as told by your doctor. Managing pain, stiffness, and swelling  If told, put ice on the knee. To do this: If you have a removable knee sleeve or brace, take it off as told by your doctor. Put ice in a plastic bag. Place a towel between your skin and the bag. Leave the ice on for 20 minutes, 2-3 times a day. Take off the ice if your skin turns bright red. This is very important. If you cannot feel pain, heat, or cold, you have a greater risk of damage to the area. If told, use an elastic bandage to put pressure (compression) on your injured knee. Raise your knee above the level of your heart while you are sitting or lying down. Sleep with a pillow under your knee. General instructions Take over-the-counter and prescription medicines only as told by your doctor. Do not smoke or use any products that contain nicotine or tobacco. If you need help quitting, ask your doctor. If you are  overweight, work with your doctor and a food expert (dietitian) to set goals to lose weight. Being overweight can make your knee hurt more. Watch for any changes in your symptoms. Keep all follow-up visits. Contact a doctor if: The knee pain does not stop. The knee pain changes or gets worse. You have a fever along with knee pain. Your knee is red or feels warm when you touch it. Your knee gives out or locks up. Get help right away if: Your knee swells, and the swelling gets worse. You cannot move your knee. You have very bad knee pain that does not get better with pain medicine. Summary Many things can cause knee pain. The pain often goes away on its own with time and rest. Your doctor may do tests to find out the cause of the pain. Watch for any changes in your symptoms. Relieve your pain with rest, medicines, light activity, and use of ice. Get help right away if you cannot move your knee or your knee pain is very bad. This information is not intended to replace advice given to you by your health care provider. Make sure you discuss any questions you have with your health care provider. Document Revised: 10/28/2019 Document Reviewed: 10/28/2019 Elsevier Patient Education  2023 [employer] Inc[employer]  

## 2022-08-01 NOTE — Assessment & Plan Note (Signed)
Clinically stable. Unremarkable physical examination Differential diagnosis discussed Pain management discussed Diclofenac 75 mg twice a day as needed X-ray done today.  Report reviewed. Needs orthopedic evaluation. Referral placed today.

## 2022-08-01 NOTE — Progress Notes (Signed)
Randy Wilson 57 y.o.   Chief Complaint  Patient presents with   Acute Visit    Left knee swollen, patient states he has been going on for about 3 weeks     HISTORY OF PRESENT ILLNESS: Acute problem visit today. This is a 57 y.o. male complaining of left knee pain for the last 5 weeks. Denies injury. Taking Tylenol. No other complaints or medical concerns today.  HPI   Prior to Admission medications   Medication Sig Start Date End Date Taking? Authorizing Provider  allopurinol (ZYLOPRIM) 100 MG tablet TAKE 1 TABLET BY MOUTH EVERY DAY 12/25/21  Yes Maximiano Coss, NP  cetirizine (ZYRTEC) 10 MG tablet Take 1 tablet (10 mg total) by mouth daily. Patient taking differently: Take 10 mg by mouth as needed. 07/09/18  Yes Stallings, Zoe A, MD  EPINEPHrine 0.3 mg/0.3 mL IJ SOAJ injection Inject 0.3 mg into the muscle as needed for anaphylaxis. 06/05/21  Yes Reyah Streeter, Ines Bloomer, MD  ustekinumab (STELARA) 90 MG/ML SOSY injection Inject 1 mL (90 mg total) into the skin every 8 (eight) weeks. 03/08/22  Yes Danis, Kirke Corin, MD  tiZANidine (ZANAFLEX) 4 MG tablet Take 1 tablet (4 mg total) by mouth every 8 (eight) hours as needed for muscle spasms. Patient not taking: Reported on 08/01/2022 06/28/22   Gregor Hams, MD    Allergies  Allergen Reactions   Tramadol Itching   Shrimp [Shellfish Allergy] Hives    Patient Active Problem List   Diagnosis Date Noted   Concussion wth loss of consciousness of 30 minutes or less 06/25/2022   Dyslipidemia 03/08/2022   Crohn's disease of small intestine ([place]) 03/01/2022   Residual hemorrhoidal skin tags 03/31/2020   Prediabetes 01/20/2020   Erectile dysfunction 01/20/2020   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   Asthma 04/24/2015   Hx of colonic polyp 11/08/2014   History of colonic polyps 09/17/2014    Past Medical History:  Diagnosis Date   Crohn's disease of small intestine ([place]) 2017   followed by dr h. danis;  s/p colon resection  11-02-2015,  on humira   History of asthma    child   History of colon polyps    History of COVID-19 05/30/2020   positive result in epicrunny nose/cough x 3 weeks all symptoms resolved   History of gout    last flare up 1 year ago per pt on 04-03-2021   OA (osteoarthritis)    Right wrist fracture    fell off ladder per pt on 02-08-2021   Right wrist fracture, closed, initial encounter 01/22/2021    Past Surgical History:  Procedure Laterality Date   ANKLE SURGERY Left 2002   bone spurs removed   COLONOSCOPY     last one 02-25-2020  by dr h. danis   HARDWARE REMOVAL Right 04/06/2021   Procedure: Right wrist removal of hardware;  Surgeon: [name], MD;  Location: [facility];  Service: Orthopedics;  Laterality: Right;  with local anesthesia Needs 30 minutes   LAPAROSCOPIC ILEOCECECTOMY N/A 11/02/2015   Procedure: LAPAROSCOPIC ILEOCECECTOMY;  Surgeon: Leighton Ruff, MD;  Location: [facility];  Service: General;  Laterality: N/A;   ORIF WRIST FRACTURE Right 02/09/2021   Procedure: OPEN REDUCTION INTERNAL FIXATION (ORIF) WRIST FRACTURE;  Surgeon: [name], MD;  Location: [facility];  Service: Orthopedics;  Laterality: Right;  WITH REGIONAL   SHOULDER ARTHROSCOPY W/ SUBACROMIAL DECOMPRESSION AND DISTAL CLAVICLE EXCISION Left 05/07/2008   '@[facility]'$  by dr  graves;   bone spurs removed    Social History   Socioeconomic History   Marital status: Married    Spouse name: Kristeen Miss    Number of children: 3   Years of education: Not on file   Highest education level: High school graduate  Occupational History   Occupation: Recieving Scientist, clinical (histocompatibility and immunogenetics)    Comment: [employer]  Tobacco Use   Smoking status: Former    Packs/day: 1.00    Years: 25.00    Total pack years: 25.00    Types: Cigarettes    Quit date: 01/21/2006    Years since quitting: 16.5   Smokeless tobacco: Never  Vaping Use   Vaping Use: Never used  Substance and Sexual Activity   Alcohol use: Yes     Alcohol/week: 6.0 standard drinks of alcohol    Types: 6 Cans of beer per week    Comment: [facility] beer   Drug use: Never   Sexual activity: Yes    Comment: with monogamous wife, she takes OCP  Other Topics Concern   Not on file  Social History Narrative   Pt is from [place].    Social Determinants of Health   Financial Resource Strain: Low Risk  (10/24/2017)   Overall Financial Resource Strain (CARDIA)    Difficulty of Paying Living Expenses: Not hard at all  Food Insecurity: No Food Insecurity (10/24/2017)   Hunger Vital Sign    Worried About Running Out of Food in the Last Year: Never true    Ran Out of Food in the Last Year: Never true  Transportation Needs: No Transportation Needs (10/24/2017)   PRAPARE - Hydrologist (Medical): No    Lack of Transportation (Non-Medical): No  Physical Activity: Sufficiently Active (10/24/2017)   Exercise Vital Sign    Days of Exercise per Week: 4 days    Minutes of Exercise per Session: 60 min  Stress: No Stress Concern Present (10/24/2017)   [place]    Feeling of Stress : Not at all  Social Connections: Moderately Integrated (10/24/2017)   Social Connection and Isolation Panel [NHANES]    Frequency of Communication with Friends and Family: More than three times a week    Frequency of Social Gatherings with Friends and Family: More than three times a week    Attends Religious Services: 1 to 4 times per year    Active Member of [employer] or Organizations: No    Attends Archivist Meetings: Never    Marital Status: Married  Human resources officer Violence: Not At Risk (10/24/2017)   Humiliation, Afraid, Rape, and Kick questionnaire    Fear of Current or Ex-Partner: No    Emotionally Abused: No    Physically Abused: No    Sexually Abused: No    Family History  Problem Relation Age of Onset   Asthma Mother 86       asthma attack    Asthma  Sister    Stomach cancer Sister    Stroke Brother        Myocardial infarction   Stroke Brother    Colon cancer Neg Hx    Rectal cancer Neg Hx    Esophageal cancer Neg Hx    Colon polyps Neg Hx    Allergic rhinitis Neg Hx    Angioedema Neg Hx    Eczema Neg Hx    Urticaria Neg Hx    Liver cancer Neg Hx  Pancreatic cancer Neg Hx      Review of Systems  Constitutional: Negative.  Negative for chills and fever.  HENT: Negative.  Negative for congestion and sore throat.   Respiratory: Negative.  Negative for cough and shortness of breath.   Cardiovascular: Negative.  Negative for chest pain and palpitations.  Gastrointestinal:  Negative for abdominal pain, diarrhea, nausea and vomiting.  Genitourinary: Negative.  Negative for dysuria and hematuria.  Musculoskeletal:  Positive for joint pain (Left knee).  Skin: Negative.  Negative for rash.  All other systems reviewed and are negative.  Today's Vitals   08/01/22 1302  BP: (!) 146/90  Pulse: 70  Temp: 97.9 F (36.6 C)  TempSrc: Oral  SpO2: 96%  Weight: 233 lb (105.7 kg)  Height: 6' (1.829 m)   Body mass index is 31.6 kg/m.   Physical Exam Vitals reviewed.  Constitutional:      Appearance: Normal appearance.  Eyes:     Extraocular Movements: Extraocular movements intact.  Cardiovascular:     Rate and Rhythm: Normal rate.  Pulmonary:     Effort: Pulmonary effort is normal.  Musculoskeletal:     Comments: Left knee: No erythema or bruising.  Full range of motion.  Minimal swelling.  No localized tenderness.  Stable in flexion and extension.  No significant findings.  Neurological:     General: No focal deficit present.     Mental Status: He is alert and oriented to person, place, and time.  Psychiatric:        Mood and Affect: Mood normal.        Behavior: Behavior normal.    DG Knee Complete 4 Views Left  Result Date: 08/01/2022 CLINICAL DATA:  Left knee pain for 6 months EXAM: LEFT KNEE - COMPLETE 4+ VIEW  COMPARISON:  None Available. FINDINGS: No evidence of fracture, dislocation, or joint effusion. Medial tibiofemoral and patellofemoral joint space narrowing with small marginal osteophytes. There is a geographic intramedullary sclerotic lesion in the mid femoral shaft, not significantly changed from prior examination, likely chronic bone infarct. Soft tissues are unremarkable. IMPRESSION: 1. Mild-to-moderate medial tibiofemoral and patellofemoral osteoarthritis. 2. No acute fracture or dislocation. Electronically Signed   By: Keane Police D.O.   On: 08/01/2022 13:36     ASSESSMENT & PLAN: Problem List Items Addressed This Visit       Other   Acute pain of left knee - Primary    Clinically stable. Unremarkable physical examination Differential diagnosis discussed Pain management discussed Diclofenac 75 mg twice a day as needed X-ray done today.  Report reviewed. Needs orthopedic evaluation. Referral placed today.      Relevant Medications   diclofenac (VOLTAREN) 75 MG EC tablet   Other Relevant Orders   DG Knee Complete 4 Views Left   Ambulatory referral to Sports Medicine   Patient Instructions  Acute Knee Pain, Adult Many things can cause knee pain. Sometimes, knee pain is sudden (acute) and may be caused by damage, swelling, or irritation of the muscles and tissues that support your knee. The pain often goes away on its own with time and rest. If the pain does not go away, tests may be done to find out what is causing the pain. Follow these instructions at home: If you have a knee sleeve or brace:  Wear the knee sleeve or brace as told by your doctor. Take it off only as told by your doctor. Loosen it if your toes: Tingle. Become numb. Turn cold and blue.  Keep it clean. If the knee sleeve or brace is not waterproof: Do not let it get wet. Cover it with a watertight covering when you take a bath or shower. Activity Rest your knee. Do not do things that cause pain or make  pain worse. Avoid activities where both feet leave the ground at the same time (high-impact activities). Examples are running, jumping rope, and doing jumping jacks. Work with a physical therapist to make a safe exercise program, as told by your doctor. Managing pain, stiffness, and swelling  If told, put ice on the knee. To do this: If you have a removable knee sleeve or brace, take it off as told by your doctor. Put ice in a plastic bag. Place a towel between your skin and the bag. Leave the ice on for 20 minutes, 2-3 times a day. Take off the ice if your skin turns bright red. This is very important. If you cannot feel pain, heat, or cold, you have a greater risk of damage to the area. If told, use an elastic bandage to put pressure (compression) on your injured knee. Raise your knee above the level of your heart while you are sitting or lying down. Sleep with a pillow under your knee. General instructions Take over-the-counter and prescription medicines only as told by your doctor. Do not smoke or use any products that contain nicotine or tobacco. If you need help quitting, ask your doctor. If you are overweight, work with your doctor and a food expert (dietitian) to set goals to lose weight. Being overweight can make your knee hurt more. Watch for any changes in your symptoms. Keep all follow-up visits. Contact a doctor if: The knee pain does not stop. The knee pain changes or gets worse. You have a fever along with knee pain. Your knee is red or feels warm when you touch it. Your knee gives out or locks up. Get help right away if: Your knee swells, and the swelling gets worse. You cannot move your knee. You have very bad knee pain that does not get better with pain medicine. Summary Many things can cause knee pain. The pain often goes away on its own with time and rest. Your doctor may do tests to find out the cause of the pain. Watch for any changes in your symptoms. Relieve  your pain with rest, medicines, light activity, and use of ice. Get help right away if you cannot move your knee or your knee pain is very bad. This information is not intended to replace advice given to you by your health care provider. Make sure you discuss any questions you have with your health care provider. Document Revised: 10/28/2019 Document Reviewed: 10/28/2019 Elsevier Patient Education  [place], MD [facility]

## 2022-08-02 ENCOUNTER — Encounter: Payer: BC Managed Care – PPO | Admitting: Rehabilitative and Restorative Service Providers"
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
## 2022-08-03 ENCOUNTER — Ambulatory Visit: Payer: BC Managed Care – PPO | Admitting: Family Medicine

## 2022-08-03 ENCOUNTER — Ambulatory Visit: Payer: Self-pay

## 2022-08-03 VITALS — BP 160/102 | HR 57 | Ht 72.0 in | Wt 233.0 lb

## 2022-08-03 DIAGNOSIS — G8929 Other chronic pain: Secondary | ICD-10-CM

## 2022-08-03 DIAGNOSIS — M25562 Pain in left knee: Secondary | ICD-10-CM | POA: Diagnosis not present

## 2022-08-03 DIAGNOSIS — M1712 Unilateral primary osteoarthritis, left knee: Secondary | ICD-10-CM | POA: Diagnosis not present

## 2022-08-03 NOTE — Patient Instructions (Addendum)
Thank you for coming in today.   You received an injection today. Seek immediate medical attention if the joint becomes red, extremely painful, or is oozing fluid.   Please use Voltaren gel (Generic Diclofenac Gel) up to 4x daily for pain as needed.  This is available over-the-counter as both the name brand Voltaren gel and the generic diclofenac gel.   If this shot doesn't work, let me know and we can work to authorize gel shots.   Check back as needed

## 2022-08-06 ENCOUNTER — Encounter: Payer: BC Managed Care – PPO | Admitting: Rehabilitative and Restorative Service Providers"

## 2022-08-09 ENCOUNTER — Encounter: Payer: BC Managed Care – PPO | Admitting: Rehabilitative and Restorative Service Providers"

## 2022-09-10 ENCOUNTER — Ambulatory Visit: Payer: BC Managed Care – PPO | Admitting: Emergency Medicine

## 2022-10-05 ENCOUNTER — Encounter (HOSPITAL_COMMUNITY): Payer: Self-pay

## 2022-10-05 ENCOUNTER — Ambulatory Visit (HOSPITAL_COMMUNITY)
Admission: EM | Admit: 2022-10-05 | Discharge: 2022-10-05 | Disposition: A | Discharge status: Home / Self Care | Payer: BC Managed Care – PPO | Attending: Family Medicine | Admitting: Family Medicine

## 2022-10-05 ENCOUNTER — Ambulatory Visit (INDEPENDENT_AMBULATORY_CARE_PROVIDER_SITE_OTHER): Payer: BC Managed Care – PPO

## 2022-10-05 DIAGNOSIS — S161XXA Strain of muscle, fascia and tendon at neck level, initial encounter: Secondary | ICD-10-CM

## 2022-10-05 DIAGNOSIS — S39012A Strain of muscle, fascia and tendon of lower back, initial encounter: Secondary | ICD-10-CM

## 2022-10-05 MED ORDER — CYCLOBENZAPRINE HCL 10 MG PO TABS: 10.0000 mg | ORAL_TABLET | Freq: Every day | ORAL | 0 refills | Status: DC

## 2022-10-05 MED ORDER — KETOROLAC TROMETHAMINE 30 MG/ML IJ SOLN
30.0000 mg | Freq: Once | INTRAMUSCULAR | Status: AC
  Administered 2022-10-05: 30 mg via INTRAMUSCULAR

## 2022-10-05 MED ORDER — KETOROLAC TROMETHAMINE 30 MG/ML IJ SOLN
INTRAMUSCULAR | Status: AC
  Filled 2022-10-05: qty 1
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
## 2022-10-09 ENCOUNTER — Ambulatory Visit: Payer: BC Managed Care – PPO | Admitting: Emergency Medicine

## 2022-10-09 ENCOUNTER — Encounter: Payer: Self-pay | Admitting: Emergency Medicine

## 2022-10-09 VITALS — BP 136/84 | HR 57 | Temp 97.6°F | Ht 72.0 in | Wt 232.2 lb

## 2022-10-09 DIAGNOSIS — L819 Disorder of pigmentation, unspecified: Secondary | ICD-10-CM | POA: Insufficient documentation

## 2022-10-09 DIAGNOSIS — L089 Local infection of the skin and subcutaneous tissue, unspecified: Secondary | ICD-10-CM | POA: Insufficient documentation

## 2022-10-09 MED ORDER — CEFADROXIL 500 MG PO CAPS: 500.0000 mg | ORAL_CAPSULE | Freq: Two times a day (BID) | ORAL | 0 refills | Status: AC

## 2022-10-09 MED ORDER — EPINEPHRINE 0.3 MG/0.3ML IJ SOAJ: 0.3000 mg | INTRAMUSCULAR | 3 refills | Status: DC | PRN

## 2022-10-09 NOTE — Assessment & Plan Note (Signed)
Secondary to scratching from itching Recommend to start cefadroxil 500 mg twice a day for 7 days Needs dermatology evaluation Referral placed today

## 2022-10-09 NOTE — Assessment & Plan Note (Signed)
Differential diagnosis discussed Needs dermatology evaluation Will need excision with biopsy Referral placed today.

## 2022-10-09 NOTE — Patient Instructions (Signed)
Excision of Skin Lesions Excision of a skin lesion is the removal of a section of skin by making small incisions in the skin. Through this process, the lesion is completely removed. This procedure is often done to treat or prevent cancer or infection. It may also be done to improve cosmetic appearance. You may have this procedure to remove: Cancerous (malignant) growths, such as basal cell carcinoma, squamous cell carcinoma, or melanoma. Noncancerous (benign) growths, such as a cyst or lipoma. Growths, such as moles or skin tags, which may be removed for cosmetic reasons. Various excision or surgical techniques may be used depending on your condition, the location of the lesion, and your overall health. Tell your health care provider about: Any allergies you have. All medicines you are taking, including vitamins, herbs, eye drops, creams, and over-the-counter medicines. Any problems you or family members have had with anesthetic medicines. Any bleeding problems you have. Any surgeries you have had. Any medical conditions you have. Whether you are pregnant or may be pregnant. What are the risks? Generally, this is a safe procedure. However, problems may occur, including: Bleeding. Infection. Scarring. Recurrence of the cyst, lipoma, or cancer. Allergic reaction to anesthetics, surgical materials, or ointments. Damage to nerves, blood vessels, muscles, or other structures. What happens before the procedure? Medicines Ask your health care provider about: Changing or stopping your regular medicines. This is especially important if you are taking diabetes medicines or blood thinners. Taking medicines such as aspirin and ibuprofen. These medicines can thin your blood. Do not take these medicines unless your health care provider tells you to take them. Taking over-the-counter medicines, vitamins, herbs, and supplements. General instructions Do not use any products that contain nicotine or  tobacco. These products include cigarettes, chewing tobacco, and vaping devices, such as e-cigarettes. If you need help quitting, ask your health care provider. Follow instructions from your health care provider about eating or drinking restrictions. Ask your health care provider: How your surgery site will be marked. What steps will be taken to help prevent infection. These steps may include: Removing hair at the surgery site. Washing skin with a germ-killing soap. Taking antibiotic medicine. Ask your health care provider if you will need someone to take you home from the hospital or clinic after the procedure. What happens during the procedure?  You will be given a medicine to numb the area (local anesthetic). Your health care provider will remove the lesions using one of the following excision techniques. Complete surgical excision. This procedure may be done to treat a cancerous growth or a noncancerous cyst or lesion. A small scalpel or scissors will be used to gently cut around and under the lesion until it is completely removed. If bleeding occurs, it will be stopped with a device that delivers heat (electrocautery). The edges of the wound may be stitched (sutured) together. A bandage (dressing) will be applied. Samples will be sent to a lab for testing. Excision of a cyst. An incision will be made on the cyst. The entire cyst will be removed through the incision. The incision may be closed with sutures. Shave excision. This may be done to remove a mole or other small growths. A small blade or scalpel will be used to shave off the lesion. The wound is usually left to heal on its own without sutures. The sample may be sent to a lab for testing. Punch excision. This may be done to completely remove a mole or other small growths. A small tool that   is like a cookie cutter or a hole punch is used to cut a circle shape out of the skin. The outer edges of the skin will be sutured  together. The sample may be sent to a lab for testing. Mohs micrographic surgery. This is usually done to treat skin cancer. This type of excision is mostly used on the face and ears. This procedure is minimally invasive, and it ensures the best cosmetic outcome. A scalpel or a loop instrument will be used to remove layers of the lesion until all the abnormal or cancerous tissue has been removed. The wound may be sutured, depending on its size. The tissue will be checked under a microscope right away. The procedure may vary among health care providers and hospitals. At the end of any of these procedures, antibiotic ointment will be applied as needed. What happens after the procedure? Talk with your health care provider to discuss any test results, treatment options, and if necessary, the need for more tests. Keep all follow-up visits. This is important. Summary Excision of a skin lesion is the removal of a section of skin by making small incisions in the skin. This procedure is often done to treat or prevent skin cancer, remove benign growths, or it may be done to improve cosmetic appearance. Various excision or surgical techniques may be used depending on your condition, the location of the lesion, and your overall health. After the procedure, talk with your health care provider to discuss any test results, treatment options, and if necessary, the need for more tests. Keep all follow-up visits. This is important. This information is not intended to replace advice given to you by your health care provider. Make sure you discuss any questions you have with your health care provider. Document Revised: 12/13/2020 Document Reviewed: 12/13/2020 [employer] Patient Education  2023 [employer]  

## 2022-10-09 NOTE — Progress Notes (Signed)
Randy Wilson 57 y.o.   Chief Complaint  Patient presents with   Skin Tag    Patient states he has a skin tag on his right hip, irritating , sore to touch    HISTORY OF PRESENT ILLNESS: This is a 57 y.o. male complaining of darkened lesion to his right hip area recently itching and turning red Has had the lesion for at least 10 years.  Needs dermatology referral  HPI   Prior to Admission medications   Medication Sig Start Date End Date Taking? Authorizing Provider  allopurinol (ZYLOPRIM) 100 MG tablet TAKE 1 TABLET BY MOUTH EVERY DAY 12/25/21  Yes Janeece Agee, NP  cetirizine (ZYRTEC) 10 MG tablet Take 1 tablet (10 mg total) by mouth daily. Patient taking differently: Take 10 mg by mouth as needed. 07/09/18  Yes Stallings, Zoe A, MD  EPINEPHrine 0.3 mg/0.3 mL IJ SOAJ injection Inject 0.3 mg into the muscle as needed for anaphylaxis. 06/05/21  Yes Riyad Keena, Eilleen Kempf, MD  ustekinumab (STELARA) 90 MG/ML SOSY injection Inject 1 mL (90 mg total) into the skin every 8 (eight) weeks. 03/08/22  Yes Danis, Andreas Blower, MD  diclofenac (VOLTAREN) 75 MG EC tablet Take 1 tablet (75 mg total) by mouth 2 (two) times daily. Patient not taking: Reported on 08/03/2022 08/01/22   Georgina Quint, MD  tiZANidine (ZANAFLEX) 4 MG tablet Take 1 tablet (4 mg total) by mouth every 8 (eight) hours as needed for muscle spasms. Patient not taking: Reported on 08/01/2022 06/28/22   Rodolph Bong, MD    Allergies  Allergen Reactions   Tramadol Itching   Shrimp [Shellfish Allergy] Hives    Patient Active Problem List   Diagnosis Date Noted   Acute pain of left knee 08/01/2022   Concussion wth loss of consciousness of 30 minutes or less 06/25/2022   Dyslipidemia 03/08/2022   Crohn's disease of small intestine (HCC) 03/01/2022   Residual hemorrhoidal skin tags 03/31/2020   Prediabetes 01/20/2020   Erectile dysfunction 01/20/2020   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   Asthma 04/24/2015   Hx of  colonic polyp 11/08/2014   History of colonic polyps 09/17/2014    Past Medical History:  Diagnosis Date   Crohn's disease of small intestine (HCC) 2017   followed by dr h. danis;  s/p colon resection 11-02-2015,  on humira   History of asthma    child   History of colon polyps    History of COVID-19 05/30/2020   positive result in epicrunny nose/cough x 3 weeks all symptoms resolved   History of gout    last flare up 1 year ago per pt on 04-03-2021   OA (osteoarthritis)    Right wrist fracture    fell off ladder per pt on 02-08-2021   Right wrist fracture, closed, initial encounter 01/22/2021    Past Surgical History:  Procedure Laterality Date   ANKLE SURGERY Left 2002   bone spurs removed   COLONOSCOPY     last one 02-25-2020  by dr h. danis   HARDWARE REMOVAL Right 04/06/2021   Procedure: Right wrist removal of hardware;  Surgeon: [name], MD;  Location: [facility];  Service: Orthopedics;  Laterality: Right;  with local anesthesia Needs 30 minutes   LAPAROSCOPIC ILEOCECECTOMY N/A 11/02/2015   Procedure: LAPAROSCOPIC ILEOCECECTOMY;  Surgeon: [name], MD;  Location: [facility];  Service: General;  Laterality: N/A;   ORIF WRIST FRACTURE Right 02/09/2021   Procedure: OPEN REDUCTION  INTERNAL FIXATION (ORIF) WRIST FRACTURE;  Surgeon: [name], MD;  Location: [facility];  Service: Orthopedics;  Laterality: Right;  WITH REGIONAL   SHOULDER ARTHROSCOPY W/ SUBACROMIAL DECOMPRESSION AND DISTAL CLAVICLE EXCISION Left 05/07/2008   @[facility]  by dr [name];   bone spurs removed    Social History   Socioeconomic History   Marital status: Married    Spouse name: Debbe Odea    Number of children: 3   Years of education: Not on file   Highest education level: High school graduate  Occupational History   Occupation: Recieving Solicitor    Comment: [employer]  Tobacco Use   Smoking status: Former    Packs/day: 1.00    Years: 25.00    Additional pack  years: 0.00    Total pack years: 25.00    Types: Cigarettes    Quit date: 01/21/2006    Years since quitting: 16.7   Smokeless tobacco: Never  Vaping Use   Vaping Use: Never used  Substance and Sexual Activity   Alcohol use: Yes    Alcohol/week: 6.0 standard drinks of alcohol    Types: 6 Cans of beer per week    Comment: [facility] beer   Drug use: Never   Sexual activity: Yes    Comment: with monogamous wife, she takes OCP  Other Topics Concern   Not on file  Social History Narrative   Pt is from [place].    Social Determinants of Health   Financial Resource Strain: Low Risk  (10/24/2017)   Overall Financial Resource Strain (CARDIA)    Difficulty of Paying Living Expenses: Not hard at all  Food Insecurity: No Food Insecurity (10/24/2017)   Hunger Vital Sign    Worried About Running Out of Food in the Last Year: Never true    Ran Out of Food in the Last Year: Never true  Transportation Needs: No Transportation Needs (10/24/2017)   PRAPARE - Administrator, Civil Service (Medical): No    Lack of Transportation (Non-Medical): No  Physical Activity: Sufficiently Active (10/24/2017)   Exercise Vital Sign    Days of Exercise per Week: 4 days    Minutes of Exercise per Session: 60 min  Stress: No Stress Concern Present (10/24/2017)   [employer] of Occupational Health - Occupational Stress Questionnaire    Feeling of Stress : Not at all  Social Connections: Moderately Integrated (10/24/2017)   Social Connection and Isolation Panel [NHANES]    Frequency of Communication with Friends and Family: More than three times a week    Frequency of Social Gatherings with Friends and Family: More than three times a week    Attends Religious Services: 1 to 4 times per year    Active Member of [employer] or Organizations: No    Attends Banker Meetings: Never    Marital Status: Married  Catering manager Violence: Not At Risk (10/24/2017)   Humiliation, Afraid, Rape, and  Kick questionnaire    Fear of Current or Ex-Partner: No    Emotionally Abused: No    Physically Abused: No    Sexually Abused: No    Family History  Problem Relation Age of Onset   Asthma Mother 71       asthma attack    Asthma Sister    Stomach cancer Sister    Stroke Brother        Myocardial infarction   Stroke Brother    Colon cancer Neg Hx    Rectal cancer Neg  Hx    Esophageal cancer Neg Hx    Colon polyps Neg Hx    Allergic rhinitis Neg Hx    Angioedema Neg Hx    Eczema Neg Hx    Urticaria Neg Hx    Liver cancer Neg Hx    Pancreatic cancer Neg Hx      Review of Systems  Constitutional: Negative.  Negative for chills and fever.  HENT:  Negative for congestion and sore throat.   Respiratory: Negative.  Negative for cough and shortness of breath.   Cardiovascular: Negative.  Negative for chest pain and palpitations.  Gastrointestinal:  Negative for abdominal pain, nausea and vomiting.  Skin:  Positive for itching.  Neurological: Negative.  Negative for dizziness and headaches.  All other systems reviewed and are negative.   Vitals:   10/09/22 0853  BP: 136/84  Pulse: (!) 57  Temp: 97.6 F (36.4 C)  SpO2: 94%    Physical Exam Vitals reviewed.  Constitutional:      Appearance: Normal appearance.  HENT:     Head: Normocephalic.  Eyes:     Extraocular Movements: Extraocular movements intact.  Cardiovascular:     Rate and Rhythm: Normal rate.  Pulmonary:     Effort: Pulmonary effort is normal.  Skin:    General: Skin is warm and dry.     Findings: Lesion present.  Neurological:     Mental Status: He is alert and oriented to person, place, and time.  Psychiatric:        Mood and Affect: Mood normal.        Behavior: Behavior normal.      ASSESSMENT & PLAN: A total of 32 minutes was spent with the patient and counseling/coordination of care regarding preparing for this visit, review of most recent office visit notes, review of chronic medical  problems under management, review of all medications, diagnosis of skin infection secondary to possible malignant skin lesion, need for antibiotics, need for dermatology evaluation, prognosis, documentation and need for follow-up  Problem List Items Addressed This Visit       Musculoskeletal and Integument   Hyperpigmented skin lesion - Primary    Differential diagnosis discussed Needs dermatology evaluation Will need excision with biopsy Referral placed today.      Relevant Medications   cefadroxil (DURICEF) 500 MG capsule   Other Relevant Orders   Ambulatory referral to Dermatology   Skin infection    Secondary to scratching from itching Recommend to start cefadroxil 500 mg twice a day for 7 days Needs dermatology evaluation Referral placed today      Relevant Medications   cefadroxil (DURICEF) 500 MG capsule   Other Relevant Orders   Ambulatory referral to Dermatology   Patient Instructions  Excision of Skin Lesions Excision of a skin lesion is the removal of a section of skin by making small incisions in the skin. Through this process, the lesion is completely removed. This procedure is often done to treat or prevent cancer or infection. It may also be done to improve cosmetic appearance. You may have this procedure to remove: Cancerous (malignant) growths, such as basal cell carcinoma, squamous cell carcinoma, or melanoma. Noncancerous (benign) growths, such as a cyst or lipoma. Growths, such as moles or skin tags, which may be removed for cosmetic reasons. Various excision or surgical techniques may be used depending on your condition, the location of the lesion, and your overall health. Tell your health care provider about: Any allergies you have. All medicines  you are taking, including vitamins, herbs, eye drops, creams, and over-the-counter medicines. Any problems you or family members have had with anesthetic medicines. Any bleeding problems you have. Any surgeries  you have had. Any medical conditions you have. Whether you are pregnant or may be pregnant. What are the risks? Generally, this is a safe procedure. However, problems may occur, including: Bleeding. Infection. Scarring. Recurrence of the cyst, lipoma, or cancer. Allergic reaction to anesthetics, surgical materials, or ointments. Damage to nerves, blood vessels, muscles, or other structures. What happens before the procedure? Medicines Ask your health care provider about: Changing or stopping your regular medicines. This is especially important if you are taking diabetes medicines or blood thinners. Taking medicines such as aspirin and ibuprofen. These medicines can thin your blood. Do not take these medicines unless your health care provider tells you to take them. Taking over-the-counter medicines, vitamins, herbs, and supplements. General instructions Do not use any products that contain nicotine or tobacco. These products include cigarettes, chewing tobacco, and vaping devices, such as e-cigarettes. If you need help quitting, ask your health care provider. Follow instructions from your health care provider about eating or drinking restrictions. Ask your health care provider: How your surgery site will be marked. What steps will be taken to help prevent infection. These steps may include: Removing hair at the surgery site. Washing skin with a germ-killing soap. Taking antibiotic medicine. Ask your health care provider if you will need someone to take you home from the hospital or clinic after the procedure. What happens during the procedure?  You will be given a medicine to numb the area (local anesthetic). Your health care provider will remove the lesions using one of the following excision techniques. Complete surgical excision. This procedure may be done to treat a cancerous growth or a noncancerous cyst or lesion. A small scalpel or scissors will be used to gently cut around and  under the lesion until it is completely removed. If bleeding occurs, it will be stopped with a device that delivers heat (electrocautery). The edges of the wound may be stitched (sutured) together. A bandage (dressing) will be applied. Samples will be sent to a lab for testing. Excision of a cyst. An incision will be made on the cyst. The entire cyst will be removed through the incision. The incision may be closed with sutures. Shave excision. This may be done to remove a mole or other small growths. A small blade or scalpel will be used to shave off the lesion. The wound is usually left to heal on its own without sutures. The sample may be sent to a lab for testing. Punch excision. This may be done to completely remove a mole or other small growths. A small tool that is like a cookie cutter or a hole punch is used to cut a circle shape out of the skin. The outer edges of the skin will be sutured together. The sample may be sent to a lab for testing. Mohs micrographic surgery. This is usually done to treat skin cancer. This type of excision is mostly used on the face and ears. This procedure is minimally invasive, and it ensures the best cosmetic outcome. A scalpel or a loop instrument will be used to remove layers of the lesion until all the abnormal or cancerous tissue has been removed. The wound may be sutured, depending on its size. The tissue will be checked under a microscope right away. The procedure may vary among health care providers and  hospitals. At the end of any of these procedures, antibiotic ointment will be applied as needed. What happens after the procedure? Talk with your health care provider to discuss any test results, treatment options, and if necessary, the need for more tests. Keep all follow-up visits. This is important. Summary Excision of a skin lesion is the removal of a section of skin by making small incisions in the skin. This procedure is often done to treat  or prevent skin cancer, remove benign growths, or it may be done to improve cosmetic appearance. Various excision or surgical techniques may be used depending on your condition, the location of the lesion, and your overall health. After the procedure, talk with your health care provider to discuss any test results, treatment options, and if necessary, the need for more tests. Keep all follow-up visits. This is important. This information is not intended to replace advice given to you by your health care provider. Make sure you discuss any questions you have with your health care provider. Document Revised: 12/13/2020 Document Reviewed: 12/13/2020 Elsevier Patient Education  2023 Elsevier Inc.    Edwina Barth, MD [facility]

## 2022-11-12 ENCOUNTER — Encounter: Payer: Self-pay | Admitting: Gastroenterology

## 2022-11-12 ENCOUNTER — Ambulatory Visit: Payer: BC Managed Care – PPO | Admitting: Gastroenterology

## 2022-11-12 VITALS — BP 132/80 | HR 61 | Ht 72.0 in | Wt 231.0 lb

## 2022-11-12 DIAGNOSIS — K5000 Crohn's disease of small intestine without complications: Secondary | ICD-10-CM | POA: Diagnosis not present

## 2022-11-12 DIAGNOSIS — Z7960 Long term (current) use of unspecified immunomodulators and immunosuppressants: Secondary | ICD-10-CM | POA: Diagnosis not present

## 2022-11-12 NOTE — Patient Instructions (Signed)
_______________________________________________________  If your blood pressure at your visit was 140/90 or greater, please contact your primary care physician to follow up on this.  _______________________________________________________  If you are age 59 or older, your body mass index should be between 23-30. Your Body mass index is 31.33 kg/m. If this is out of the aforementioned range listed, please consider follow up with your Primary Care Provider.  If you are age 65 or younger, your body mass index should be between 19-25. Your Body mass index is 31.33 kg/m. If this is out of the aformentioned range listed, please consider follow up with your Primary Care Provider.   ________________________________________________________  The [facility] providers would like to encourage you to use [facility] to communicate with providers for non-urgent requests or questions.  Due to long hold times on the telephone, sending your provider a message by [facility] may be a faster and more efficient way to get a response.  Please allow 48 business hours for a response.  Please remember that this is for non-urgent requests.  _______________________________________________________  Your provider has requested that you go to the basement level for lab work before the next Stalara dose. Press "B" on the elevator. The lab is located at the first door on the left as you exit the elevator.  Due to recent changes in healthcare laws, you may see the results of your imaging and laboratory studies on MyChart before your provider has had a chance to review them.  We understand that in some cases there may be results that are confusing or concerning to you. Not all laboratory results come back in the same time frame and the provider may be waiting for multiple results in order to interpret others.  Please give [place] 48 hours in order for your provider to thoroughly review all the results before contacting the office for  clarification of your results.   It was a pleasure to see you today!  Thank you for trusting me with your gastrointestinal care!
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
## 2022-11-12 NOTE — Progress Notes (Deleted)
     [facility] Progress Note  Chief Complaint: Ileal Crohn's disease  Subjective  History: Summary of pertinent GI issues: Ileal Crohn's disease presenting with giant inflammatory polyp on ileocecal valve.  Ileocecal resection, periods of time afterwards with noncompliant follow-up, even after starting Humira.  Later improved adherence to follow-up on Humira monotherapy,, poor control on colonoscopy September 2023.  Recommended Cristy Folks, insurance would approve Marcy Panning which was started February 2024.  ***  ROS: Cardiovascular:  no chest pain Respiratory: no dyspnea  The patient's Past Medical, Family and Social History were reviewed and are on file in the EMR.  Objective:  Med list reviewed  Current Outpatient Medications:    allopurinol (ZYLOPRIM) 100 MG tablet, TAKE 1 TABLET BY MOUTH EVERY DAY, Disp: 90 tablet, Rfl: 3   cetirizine (ZYRTEC) 10 MG tablet, Take 1 tablet (10 mg total) by mouth daily. (Patient taking differently: Take 10 mg by mouth as needed.), Disp: 30 tablet, Rfl: 11   diclofenac (VOLTAREN) 75 MG EC tablet, Take 1 tablet (75 mg total) by mouth 2 (two) times daily. (Patient not taking: Reported on 08/03/2022), Disp: 30 tablet, Rfl: 0   EPINEPHrine 0.3 mg/0.3 mL IJ SOAJ injection, Inject 0.3 mg into the muscle as needed for anaphylaxis., Disp: 1 each, Rfl: 3   tiZANidine (ZANAFLEX) 4 MG tablet, Take 1 tablet (4 mg total) by mouth every 8 (eight) hours as needed for muscle spasms. (Patient not taking: Reported on 08/01/2022), Disp: 60 tablet, Rfl: 1   ustekinumab (STELARA) 90 MG/ML SOSY injection, Inject 1 mL (90 mg total) into the skin every 8 (eight) weeks., Disp: 1 mL, Rfl: 5   Vital signs in last 24 hrs: There were no vitals filed for this visit. Wt Readings from Last 3 Encounters:  10/09/22 232 lb 4 oz (105.3 kg)  08/03/22 233 lb (105.7 kg)  08/01/22 233 lb (105.7 kg)    Physical Exam  *** HEENT: sclera anicteric, oral mucosa moist without  lesions Neck: supple, no thyromegaly, JVD or lymphadenopathy Cardiac: ***,  no peripheral edema Pulm: clear to auscultation bilaterally, normal RR and effort noted Abdomen: soft, *** tenderness, with active bowel sounds. No guarding or palpable hepatosplenomegaly. Skin; warm and dry, no jaundice or rash  Labs:   ___________________________________________ Radiologic studies:   ____________________________________________ Other:   _____________________________________________ Assessment & Plan  Assessment: No diagnosis found.   Currently on Stelara every 8 weeks Plan: Labs today: CBC, CMP, ESR, CRP, Stelara drug and Ab levels , fecal calprotectin  *** minutes were spent on this encounter (including chart review, history/exam, counseling/coordination of care, and documentation) > 50% of that time was spent on counseling and coordination of care.   Randy Wilson

## 2022-12-06 ENCOUNTER — Encounter: Payer: Self-pay | Admitting: Emergency Medicine

## 2022-12-06 ENCOUNTER — Ambulatory Visit: Payer: BC Managed Care – PPO | Admitting: Emergency Medicine

## 2022-12-06 ENCOUNTER — Ambulatory Visit (INDEPENDENT_AMBULATORY_CARE_PROVIDER_SITE_OTHER): Payer: BC Managed Care – PPO

## 2022-12-06 VITALS — BP 122/78 | HR 65 | Temp 98.1°F | Ht 72.0 in | Wt 234.0 lb

## 2022-12-06 DIAGNOSIS — S6722XA Crushing injury of left hand, initial encounter: Secondary | ICD-10-CM | POA: Diagnosis not present

## 2022-12-06 NOTE — Assessment & Plan Note (Signed)
Status post injury 3 weeks ago Slowly getting better. Has full function of hand.  Non-restricting injury Still has residual swelling and tenderness X-ray done today.  Report reviewed. Pain management discussed. May take Tylenol and or Advil as needed for pain

## 2022-12-06 NOTE — Progress Notes (Signed)
Randy Wilson 57 y.o.   Chief Complaint  Patient presents with   Hand Pain    Pain in left hand for about 3 weeks has gotten worse     HISTORY OF PRESENT ILLNESS: Acute problem visit today. This is a 57 y.o. male complaining of left hand injury sustained 3 weeks ago Heavy load about 10 pounds fell on his hand.  Pain since then.  Still functional however. No other injuries.  No other complaints or medical concerns today.  Hand Pain  Pertinent negatives include no chest pain.     Prior to Admission medications   Medication Sig Start Date End Date Taking? Authorizing Provider  allopurinol (ZYLOPRIM) 100 MG tablet TAKE 1 TABLET BY MOUTH EVERY DAY 12/25/21  Yes Janeece Agee, NP  cetirizine (ZYRTEC) 10 MG tablet Take 1 tablet (10 mg total) by mouth daily. Patient taking differently: Take 10 mg by mouth as needed. 07/09/18  Yes Stallings, Zoe A, MD  EPINEPHrine 0.3 mg/0.3 mL IJ SOAJ injection Inject 0.3 mg into the muscle as needed for anaphylaxis. 10/09/22  Yes Lynesha Bango, Eilleen Kempf, MD  ustekinumab (STELARA) 90 MG/ML SOSY injection Inject 1 mL (90 mg total) into the skin every 8 (eight) weeks. 03/08/22  Yes Danis, Andreas Blower, MD  diclofenac (VOLTAREN) 75 MG EC tablet Take 1 tablet (75 mg total) by mouth 2 (two) times daily. Patient not taking: Reported on 11/12/2022 08/01/22   Georgina Quint, MD  tiZANidine (ZANAFLEX) 4 MG tablet Take 1 tablet (4 mg total) by mouth every 8 (eight) hours as needed for muscle spasms. Patient not taking: Reported on 11/12/2022 06/28/22   Rodolph Bong, MD    Allergies  Allergen Reactions   Tramadol Itching   Shrimp [Shellfish Allergy] Hives    Patient Active Problem List   Diagnosis Date Noted   Hyperpigmented skin lesion 10/09/2022   Skin infection 10/09/2022   Concussion wth loss of consciousness of 30 minutes or less 06/25/2022   Dyslipidemia 03/08/2022   Crohn's disease of small intestine (HCC) 03/01/2022   Residual hemorrhoidal skin tags  03/31/2020   Prediabetes 01/20/2020   Erectile dysfunction 01/20/2020   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   Asthma 04/24/2015   Hx of colonic polyp 11/08/2014   History of colonic polyps 09/17/2014    Past Medical History:  Diagnosis Date   Crohn's disease of small intestine (HCC) 2017   followed by dr h. danis;  s/p colon resection 11-02-2015,  on humira   History of asthma    child   History of colon polyps    History of COVID-19 05/30/2020   positive result in epicrunny nose/cough x 3 weeks all symptoms resolved   History of gout    last flare up 1 year ago per pt on 04-03-2021   OA (osteoarthritis)    Right wrist fracture    fell off ladder per pt on 02-08-2021   Right wrist fracture, closed, initial encounter 01/22/2021    Past Surgical History:  Procedure Laterality Date   ANKLE SURGERY Left 2002   bone spurs removed   COLONOSCOPY     last one 02-25-2020  by dr h. danis   HARDWARE REMOVAL Right 04/06/2021   Procedure: Right wrist removal of hardware;  Surgeon: [name], MD;  Location: [facility];  Service: Orthopedics;  Laterality: Right;  with local anesthesia Needs 30 minutes   LAPAROSCOPIC ILEOCECECTOMY N/A 11/02/2015   Procedure: LAPAROSCOPIC ILEOCECECTOMY;  Surgeon: [name],  MD;  Location: [facility];  Service: General;  Laterality: N/A;   ORIF WRIST FRACTURE Right 02/09/2021   Procedure: OPEN REDUCTION INTERNAL FIXATION (ORIF) WRIST FRACTURE;  Surgeon: [name], MD;  Location: [facility];  Service: Orthopedics;  Laterality: Right;  WITH REGIONAL   SHOULDER ARTHROSCOPY W/ SUBACROMIAL DECOMPRESSION AND DISTAL CLAVICLE EXCISION Left 05/07/2008   @[facility]  by dr [name];   bone spurs removed    Social History   Socioeconomic History   Marital status: Married    Spouse name: Debbe Odea    Number of children: 3   Years of education: Not on file   Highest education level: High school graduate  Occupational History    Occupation: Recieving Solicitor    Comment: [employer]  Tobacco Use   Smoking status: Former    Current packs/day: 0.00    Average packs/day: 1 pack/day for 25.0 years (25.0 ttl pk-yrs)    Types: Cigarettes    Start date: 01/21/1981    Quit date: 01/21/2006    Years since quitting: 16.8   Smokeless tobacco: Never  Vaping Use   Vaping status: Never Used  Substance and Sexual Activity   Alcohol use: Yes    Alcohol/week: 6.0 standard drinks of alcohol    Types: 6 Cans of beer per week    Comment: [facility] beer   Drug use: Never   Sexual activity: Yes    Comment: with monogamous wife, she takes OCP  Other Topics Concern   Not on file  Social History Narrative   Pt is from [place].    Social Determinants of Health   Financial Resource Strain: Low Risk  (10/24/2017)   Overall Financial Resource Strain (CARDIA)    Difficulty of Paying Living Expenses: Not hard at all  Food Insecurity: No Food Insecurity (10/24/2017)   Hunger Vital Sign    Worried About Running Out of Food in the Last Year: Never true    Ran Out of Food in the Last Year: Never true  Transportation Needs: No Transportation Needs (10/24/2017)   PRAPARE - Administrator, Civil Service (Medical): No    Lack of Transportation (Non-Medical): No  Physical Activity: Sufficiently Active (10/24/2017)   Exercise Vital Sign    Days of Exercise per Week: 4 days    Minutes of Exercise per Session: 60 min  Stress: No Stress Concern Present (10/24/2017)   [employer] of Occupational Health - Occupational Stress Questionnaire    Feeling of Stress : Not at all  Social Connections: Moderately Integrated (10/24/2017)   Social Connection and Isolation Panel [NHANES]    Frequency of Communication with Friends and Family: More than three times a week    Frequency of Social Gatherings with Friends and Family: More than three times a week    Attends Religious Services: 1 to 4 times per year    Active Member of [employer] or  Organizations: No    Attends Banker Meetings: Never    Marital Status: Married  Catering manager Violence: Unknown (08/28/2021)   Received from [facility]   HITS    Physically Hurt: Not on file    Insult or Talk Down To: Not on file    Threaten Physical Harm: Not on file    Scream or Curse: Not on file    Family History  Problem Relation Age of Onset   Asthma Mother 8       asthma attack    Asthma Sister    Stomach  cancer Sister    Stroke Brother        Myocardial infarction   Stroke Brother    Colon cancer Neg Hx    Rectal cancer Neg Hx    Esophageal cancer Neg Hx    Colon polyps Neg Hx    Allergic rhinitis Neg Hx    Angioedema Neg Hx    Eczema Neg Hx    Urticaria Neg Hx    Liver cancer Neg Hx    Pancreatic cancer Neg Hx      Review of Systems  Constitutional: Negative.  Negative for chills and fever.  HENT: Negative.  Negative for congestion and sore throat.   Respiratory: Negative.  Negative for cough and shortness of breath.   Cardiovascular: Negative.  Negative for chest pain and palpitations.  Gastrointestinal:  Negative for abdominal pain, nausea and vomiting.  Skin: Negative.  Negative for rash.  Neurological:  Negative for dizziness and headaches.  All other systems reviewed and are negative.   Vitals:   12/06/22 0841  BP: 122/78  Pulse: 65  Temp: 98.1 F (36.7 C)  SpO2: 97%    Physical Exam Vitals reviewed.  Constitutional:      Appearance: Normal appearance.  HENT:     Head: Normocephalic.  Eyes:     Extraocular Movements: Extraocular movements intact.  Cardiovascular:     Rate and Rhythm: Normal rate.  Pulmonary:     Effort: Pulmonary effort is normal.  Musculoskeletal:     Comments: Left hand: No erythema or ecchymosis.  Mild dorsal swelling.  Mild tenderness to deep palpation.  Full range of motion.  Neurovascularly intact. Left wrist within normal limits.  Skin:    General: Skin is warm and dry.  Neurological:      Mental Status: He is alert and oriented to person, place, and time.  Psychiatric:        Mood and Affect: Mood normal.        Behavior: Behavior normal.    DG Hand Complete Left  Result Date: 12/06/2022 CLINICAL DATA:  Left hand injury EXAM: LEFT HAND - COMPLETE 3+ VIEW COMPARISON:  None Available. FINDINGS: No acute fracture or dislocation. Old nonunited fracture of ulnar styloid process noted. No aggressive osseous lesion. There are mild degenerative changes of the imaged joints. No radiopaque foreign bodies. Soft tissues are within normal limits. IMPRESSION: 1. No acute fracture or dislocation. 2. Mild degenerative changes. Electronically Signed   By: Jules Schick M.D.   On: 12/06/2022 09:07     ASSESSMENT & PLAN: A total of 32 minutes was spent with the patient and counseling/coordination of care regarding preparing for this visit, review of most recent office visit notes, review of chronic medical conditions, review of all medications, diagnosis of left hand crushing injury, review of x-ray report done today, pain management, prognosis, documentation, and need for follow-up.  Problem List Items Addressed This Visit       Other   Crushing injury of left hand - Primary    Status post injury 3 weeks ago Slowly getting better. Has full function of hand.  Non-restricting injury Still has residual swelling and tenderness X-ray done today.  Report reviewed. Pain management discussed. May take Tylenol and or Advil as needed for pain      Relevant Orders   DG Hand Complete Left   Patient Instructions  Crush Injury of the Hand A crush injury of the hand happens when a great amount of force is suddenly applied to  your hand. This injury can damage your skin and many parts (structures) in the hand and wrist. Treatment will depend on which parts are damaged and how bad your injury is. What are the causes? This type of injury might happen: During a car accident. If a heavy load falls  onto the hand. If the hand is pulled into a machine during industrial or agricultural work. What are the signs or symptoms? Symptoms will vary depending on which parts of your hand have been injured. Symptoms may include: Pain in the hand, wrist, or arm. In some cases, the pain can be very bad. Bleeding at the site of injury. Tingling, numbness, or loss of feeling (sensation) in part or all of your hand. Loss of movement in part or all of your hand. How is this treated? Treatment for this condition depends on how bad your crush injury is. Treatment may include: A thorough cleaning if you have an open wound. This may or may not require surgery. Having a splint put on your fingers, hand, or forearm. Medicine to relieve pain. Antibiotic medicine to prevent infection. Stitches (sutures) to close open wounds. One or more surgeries to treat injuries to skin, bones, joints, tendons, ligaments, muscles, nerves, or blood vessels. Follow these instructions at home: If you have a splint: Wear the splint as told by your doctor. Remove it only as told by your doctor. Do not put pressure on any part of the splint until it is fully hardened. This may take many hours. Loosen the splint if your fingers tingle, get numb, or turn cold and blue. Keep the splint clean. If the splint is not waterproof: Do not let it get wet. Cover it with a watertight covering when you take a bath or shower. Wound care  If you have any skin wounds that were covered with bandages (dressings), follow instructions from your doctor about how to take care of your wounds. Make sure you: Wash your hands with soap and water before and after you change your bandage. If you cannot use soap and water, use hand sanitizer. Change your bandage as told by your doctor. Leave stitches, skin glue, or skin tape (adhesive) strips in place. They may need to stay in place for 2 weeks or longer. If tape strips get loose and curl up, you may trim  the loose edges. Do not remove tape strips completely unless your doctor says it is okay. If you have skin wounds, check them every day for signs of infection. Check for: More redness, swelling, or pain. More fluid or blood. Warmth. Pus or a bad smell. Managing pain, stiffness, and swelling  If told, put ice on the injured area. Put ice in a plastic bag. Place a towel between your skin and the bag. Leave the ice on for 20 minutes, 2-3 times a day. Raise (elevate) the injured area above the level of your heart while you are sitting or lying down. Driving Ask your doctor: If the medicine prescribed to you requires you to avoid driving or using heavy machinery. When it is safe to drive if you have a splint on your hand or arm. Activity Return to your normal activities as told by your doctor. Ask your doctor what activities are safe for you. Work with a physical therapist (PT) or occupational therapist (OT) as told by your doctor. General instructions Take over-the-counter and prescription medicines only as told by your doctor. If you were prescribed an antibiotic, take it as told by your doctor.  Do not stop taking the antibiotic even if you start to feel better. Do not use any products that contain nicotine or tobacco. These products include cigarettes, e-cigarettes, and chewing tobacco. If you need help quitting, ask your doctor. Keep all follow-up visits as told by your doctor. This is important. These include PT and OT visits. Contact a doctor if: A wound with stitches opens up. You have more redness, swelling, or pain in your hand. You have more fluid or blood coming from your hand. Your hand feels warm to the touch. You have pus or a bad smell coming from your hand. You have a fever. Get help right away if: You suddenly have very bad pain in your hand. You had feeling in your hand before but you suddenly lose feeling. Your wrist or hand becomes bent (contracted)without you trying  to bend it. Your symptoms had gotten better and they suddenly get worse. Your hand or fingers are turning pink or blue. Summary A crush injury of the hand can damage your skin and many parts (structures) in the hand and wrist. Symptoms will vary depending on which parts of your hand have been injured. Treatment for this condition depends on how bad your crush injury is. This information is not intended to replace advice given to you by your health care provider. Make sure you discuss any questions you have with your health care provider. Document Revised: 04/30/2022 Document Reviewed: 04/30/2022 Elsevier Patient Education  2024 Elsevier Inc.    Edwina Barth, MD [facility]

## 2022-12-06 NOTE — Patient Instructions (Signed)
Crush Injury of the Hand A crush injury of the hand happens when a great amount of force is suddenly applied to your hand. This injury can damage your skin and many parts (structures) in the hand and wrist. Treatment will depend on which parts are damaged and how bad your injury is. What are the causes? This type of injury might happen: During a car accident. If a heavy load falls onto the hand. If the hand is pulled into a machine during industrial or agricultural work. What are the signs or symptoms? Symptoms will vary depending on which parts of your hand have been injured. Symptoms may include: Pain in the hand, wrist, or arm. In some cases, the pain can be very bad. Bleeding at the site of injury. Tingling, numbness, or loss of feeling (sensation) in part or all of your hand. Loss of movement in part or all of your hand. How is this treated? Treatment for this condition depends on how bad your crush injury is. Treatment may include: A thorough cleaning if you have an open wound. This may or may not require surgery. Having a splint put on your fingers, hand, or forearm. Medicine to relieve pain. Antibiotic medicine to prevent infection. Stitches (sutures) to close open wounds. One or more surgeries to treat injuries to skin, bones, joints, tendons, ligaments, muscles, nerves, or blood vessels. Follow these instructions at home: If you have a splint: Wear the splint as told by your doctor. Remove it only as told by your doctor. Do not put pressure on any part of the splint until it is fully hardened. This may take many hours. Loosen the splint if your fingers tingle, get numb, or turn cold and blue. Keep the splint clean. If the splint is not waterproof: Do not let it get wet. Cover it with a watertight covering when you take a bath or shower. Wound care  If you have any skin wounds that were covered with bandages (dressings), follow instructions from your doctor about how to take  care of your wounds. Make sure you: Wash your hands with soap and water before and after you change your bandage. If you cannot use soap and water, use hand sanitizer. Change your bandage as told by your doctor. Leave stitches, skin glue, or skin tape (adhesive) strips in place. They may need to stay in place for 2 weeks or longer. If tape strips get loose and curl up, you may trim the loose edges. Do not remove tape strips completely unless your doctor says it is okay. If you have skin wounds, check them every day for signs of infection. Check for: More redness, swelling, or pain. More fluid or blood. Warmth. Pus or a bad smell. Managing pain, stiffness, and swelling  If told, put ice on the injured area. Put ice in a plastic bag. Place a towel between your skin and the bag. Leave the ice on for 20 minutes, 2-3 times a day. Raise (elevate) the injured area above the level of your heart while you are sitting or lying down. Driving Ask your doctor: If the medicine prescribed to you requires you to avoid driving or using heavy machinery. When it is safe to drive if you have a splint on your hand or arm. Activity Return to your normal activities as told by your doctor. Ask your doctor what activities are safe for you. Work with a physical therapist (PT) or occupational therapist (OT) as told by your doctor. General instructions Take over-the-counter and  prescription medicines only as told by your doctor. If you were prescribed an antibiotic, take it as told by your doctor. Do not stop taking the antibiotic even if you start to feel better. Do not use any products that contain nicotine or tobacco. These products include cigarettes, e-cigarettes, and chewing tobacco. If you need help quitting, ask your doctor. Keep all follow-up visits as told by your doctor. This is important. These include PT and OT visits. Contact a doctor if: A wound with stitches opens up. You have more redness,  swelling, or pain in your hand. You have more fluid or blood coming from your hand. Your hand feels warm to the touch. You have pus or a bad smell coming from your hand. You have a fever. Get help right away if: You suddenly have very bad pain in your hand. You had feeling in your hand before but you suddenly lose feeling. Your wrist or hand becomes bent (contracted)without you trying to bend it. Your symptoms had gotten better and they suddenly get worse. Your hand or fingers are turning pink or blue. Summary A crush injury of the hand can damage your skin and many parts (structures) in the hand and wrist. Symptoms will vary depending on which parts of your hand have been injured. Treatment for this condition depends on how bad your crush injury is. This information is not intended to replace advice given to you by your health care provider. Make sure you discuss any questions you have with your health care provider. Document Revised: 04/30/2022 Document Reviewed: 04/30/2022 Elsevier Patient Education  2024 [employer].

## 2022-12-12 ENCOUNTER — Other Ambulatory Visit: Payer: Self-pay

## 2022-12-12 ENCOUNTER — Ambulatory Visit: Payer: BC Managed Care – PPO | Admitting: Emergency Medicine

## 2022-12-12 ENCOUNTER — Ambulatory Visit: Payer: BC Managed Care – PPO | Admitting: Sports Medicine

## 2022-12-12 ENCOUNTER — Encounter: Payer: Self-pay | Admitting: Emergency Medicine

## 2022-12-12 VITALS — BP 126/88 | HR 62 | Temp 97.9°F | Ht 72.0 in | Wt 234.0 lb

## 2022-12-12 VITALS — BP 122/78 | HR 60 | Ht 72.0 in | Wt 234.0 lb

## 2022-12-12 DIAGNOSIS — M1712 Unilateral primary osteoarthritis, left knee: Secondary | ICD-10-CM

## 2022-12-12 DIAGNOSIS — M2392 Unspecified internal derangement of left knee: Secondary | ICD-10-CM | POA: Diagnosis not present

## 2022-12-12 DIAGNOSIS — M25562 Pain in left knee: Secondary | ICD-10-CM

## 2022-12-12 DIAGNOSIS — G8929 Other chronic pain: Secondary | ICD-10-CM

## 2022-12-12 NOTE — Assessment & Plan Note (Signed)
Suspected meniscal tear. Recommend orthopedic evaluation Referral to sports medicine placed today
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
## 2022-12-12 NOTE — Patient Instructions (Signed)
MRI left knee  Follow up 4 days after to discuss results

## 2022-12-12 NOTE — Patient Instructions (Signed)
Acute Knee Pain, Adult Many things can cause knee pain. Sometimes, knee pain is sudden (acute) and may be caused by damage, swelling, or irritation of the muscles and tissues that support your knee. The pain often goes away on its own with time and rest. If the pain does not go away, tests may be done to find out what is causing the pain. Follow these instructions at home: If you have a knee sleeve or brace:  Wear the knee sleeve or brace as told by your doctor. Take it off only as told by your doctor. Loosen it if your toes: Tingle. Become numb. Turn cold and blue. Keep it clean. If the knee sleeve or brace is not waterproof: Do not let it get wet. Cover it with a watertight covering when you take a bath or shower. Activity Rest your knee. Do not do things that cause pain or make pain worse. Avoid activities where both feet leave the ground at the same time (high-impact activities). Examples are running, jumping rope, and doing jumping jacks. Work with a physical therapist to make a safe exercise program, as told by your doctor. Managing pain, stiffness, and swelling  If told, put ice on the knee. To do this: If you have a removable knee sleeve or brace, take it off as told by your doctor. Put ice in a plastic bag. Place a towel between your skin and the bag. Leave the ice on for 20 minutes, 2-3 times a day. Take off the ice if your skin turns bright red. This is very important. If you cannot feel pain, heat, or cold, you have a greater risk of damage to the area. If told, use an elastic bandage to put pressure (compression) on your injured knee. Raise your knee above the level of your heart while you are sitting or lying down. Sleep with a pillow under your knee. General instructions Take over-the-counter and prescription medicines only as told by your doctor. Do not smoke or use any products that contain nicotine or tobacco. If you need help quitting, ask your doctor. If you are  overweight, work with your doctor and a food expert (dietitian) to set goals to lose weight. Being overweight can make your knee hurt more. Watch for any changes in your symptoms. Keep all follow-up visits. Contact a doctor if: The knee pain does not stop. The knee pain changes or gets worse. You have a fever along with knee pain. Your knee is red or feels warm when you touch it. Your knee gives out or locks up. Get help right away if: Your knee swells, and the swelling gets worse. You cannot move your knee. You have very bad knee pain that does not get better with pain medicine. Summary Many things can cause knee pain. The pain often goes away on its own with time and rest. Your doctor may do tests to find out the cause of the pain. Watch for any changes in your symptoms. Relieve your pain with rest, medicines, light activity, and use of ice. Get help right away if you cannot move your knee or your knee pain is very bad. This information is not intended to replace advice given to you by your health care provider. Make sure you discuss any questions you have with your health care provider. Document Revised: 10/27/2019 Document Reviewed: 10/28/2019 Elsevier Patient Education  2024 Elsevier Inc.  

## 2022-12-12 NOTE — Assessment & Plan Note (Signed)
With swelling and tenderness. Needs orthopedic evaluation. Sports medicine evaluation available today Pain management discussed

## 2022-12-12 NOTE — Progress Notes (Signed)
Randy Wilson 57 y.o.   Chief Complaint  Patient presents with   Knee Pain    Patient states he is having some left knee pain, swollen. Patient thinks it needs to be drained. X 5 days    HISTORY OF PRESENT ILLNESS: Acute problem visit today. This is a 57 y.o. male complaining of pain and swelling to left knee that started about 6 days ago States he went bowling last Thursday morning and was feeling okay.  Left knee started giving out several times after that followed by swelling and pain. No other associated symptoms.  No direct injuries. No other complaints or medical concerns today.  Knee Pain      Prior to Admission medications   Medication Sig Start Date End Date Taking? Authorizing Provider  allopurinol (ZYLOPRIM) 100 MG tablet TAKE 1 TABLET BY MOUTH EVERY DAY 12/25/21  Yes Janeece Agee, NP  cetirizine (ZYRTEC) 10 MG tablet Take 1 tablet (10 mg total) by mouth daily. Patient taking differently: Take 10 mg by mouth as needed. 07/09/18  Yes Stallings, Zoe A, MD  EPINEPHrine 0.3 mg/0.3 mL IJ SOAJ injection Inject 0.3 mg into the muscle as needed for anaphylaxis. 10/09/22  Yes Azaylia Fong, Eilleen Kempf, MD  ustekinumab (STELARA) 90 MG/ML SOSY injection Inject 1 mL (90 mg total) into the skin every 8 (eight) weeks. 03/08/22  Yes Danis, Andreas Blower, MD  diclofenac (VOLTAREN) 75 MG EC tablet Take 1 tablet (75 mg total) by mouth 2 (two) times daily. Patient not taking: Reported on 11/12/2022 08/01/22   Georgina Quint, MD  tiZANidine (ZANAFLEX) 4 MG tablet Take 1 tablet (4 mg total) by mouth every 8 (eight) hours as needed for muscle spasms. Patient not taking: Reported on 11/12/2022 06/28/22   Rodolph Bong, MD    Allergies  Allergen Reactions   Tramadol Itching   Shrimp [Shellfish Allergy] Hives    Patient Active Problem List   Diagnosis Date Noted   Hyperpigmented skin lesion 10/09/2022   Dyslipidemia 03/08/2022   Crohn's disease of small intestine (HCC) 03/01/2022   Residual  hemorrhoidal skin tags 03/31/2020   Prediabetes 01/20/2020   Erectile dysfunction 01/20/2020   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   Asthma 04/24/2015   Hx of colonic polyp 11/08/2014   History of colonic polyps 09/17/2014    Past Medical History:  Diagnosis Date   Crohn's disease of small intestine (HCC) 2017   followed by dr h. danis;  s/p colon resection 11-02-2015,  on humira   History of asthma    child   History of colon polyps    History of COVID-19 05/30/2020   positive result in epicrunny nose/cough x 3 weeks all symptoms resolved   History of gout    last flare up 1 year ago per pt on 04-03-2021   OA (osteoarthritis)    Right wrist fracture    fell off ladder per pt on 02-08-2021   Right wrist fracture, closed, initial encounter 01/22/2021    Past Surgical History:  Procedure Laterality Date   ANKLE SURGERY Left 2002   bone spurs removed   COLONOSCOPY     last one 02-25-2020  by dr h. danis   HARDWARE REMOVAL Right 04/06/2021   Procedure: Right wrist removal of hardware;  Surgeon: [name], MD;  Location: [facility];  Service: Orthopedics;  Laterality: Right;  with local anesthesia Needs 30 minutes   LAPAROSCOPIC ILEOCECECTOMY N/A 11/02/2015   Procedure: LAPAROSCOPIC ILEOCECECTOMY;  Surgeon:  [name], MD;  Location: [facility];  Service: General;  Laterality: N/A;   ORIF WRIST FRACTURE Right 02/09/2021   Procedure: OPEN REDUCTION INTERNAL FIXATION (ORIF) WRIST FRACTURE;  Surgeon: [name], MD;  Location[facility];  Service: Orthopedics;  Laterality: Right;  WITH REGIONAL   SHOULDER ARTHROSCOPY W/ SUBACROMIAL DECOMPRESSION AND DISTAL CLAVICLE EXCISION Left 05/07/2008   @[facility]  by dr [name];   bone spurs removed    Social History   Socioeconomic History   Marital status: Married    Spouse name: Debbe Odea    Number of children: 3   Years of education: Not on file   Highest education level: High school graduate   Occupational History   Occupation: Recieving Solicitor    Comment: [employer]  Tobacco Use   Smoking status: Former    Current packs/day: 0.00    Average packs/day: 1 pack/day for 25.0 years (25.0 ttl pk-yrs)    Types: Cigarettes    Start date: 01/21/1981    Quit date: 01/21/2006    Years since quitting: 16.9   Smokeless tobacco: Never  Vaping Use   Vaping status: Never Used  Substance and Sexual Activity   Alcohol use: Yes    Alcohol/week: 6.0 standard drinks of alcohol    Types: 6 Cans of beer per week    Comment: [facility] beer   Drug use: Never   Sexual activity: Yes    Comment: with monogamous wife, she takes OCP  Other Topics Concern   Not on file  Social History Narrative   Pt is from [place].    Social Determinants of Health   Financial Resource Strain: Low Risk  (10/24/2017)   Overall Financial Resource Strain (CARDIA)    Difficulty of Paying Living Expenses: Not hard at all  Food Insecurity: No Food Insecurity (10/24/2017)   Hunger Vital Sign    Worried About Running Out of Food in the Last Year: Never true    Ran Out of Food in the Last Year: Never true  Transportation Needs: No Transportation Needs (10/24/2017)   PRAPARE - Administrator, Civil Service (Medical): No    Lack of Transportation (Non-Medical): No  Physical Activity: Sufficiently Active (10/24/2017)   Exercise Vital Sign    Days of Exercise per Week: 4 days    Minutes of Exercise per Session: 60 min  Stress: No Stress Concern Present (10/24/2017)   [employer] of Occupational Health - Occupational Stress Questionnaire    Feeling of Stress : Not at all  Social Connections: Moderately Integrated (10/24/2017)   Social Connection and Isolation Panel [NHANES]    Frequency of Communication with Friends and Family: More than three times a week    Frequency of Social Gatherings with Friends and Family: More than three times a week    Attends Religious Services: 1 to 4 times per year    Active Member  of [employer] or Organizations: No    Attends Banker Meetings: Never    Marital Status: Married  Catering manager Violence: Unknown (08/28/2021)   Received from [facility]   HITS    Physically Hurt: Not on file    Insult or Talk Down To: Not on file    Threaten Physical Harm: Not on file    Scream or Curse: Not on file    Family History  Problem Relation Age of Onset   Asthma Mother 55       asthma attack    Asthma Sister  Stomach cancer Sister    Stroke Brother        Myocardial infarction   Stroke Brother    Colon cancer Neg Hx    Rectal cancer Neg Hx    Esophageal cancer Neg Hx    Colon polyps Neg Hx    Allergic rhinitis Neg Hx    Angioedema Neg Hx    Eczema Neg Hx    Urticaria Neg Hx    Liver cancer Neg Hx    Pancreatic cancer Neg Hx      Review of Systems  Constitutional: Negative.  Negative for chills and fever.  HENT:  Negative for congestion and sore throat.   Respiratory: Negative.  Negative for cough and shortness of breath.   Cardiovascular: Negative.  Negative for chest pain and palpitations.  Gastrointestinal:  Negative for nausea and vomiting.  Musculoskeletal:  Positive for joint pain (Left knee).  Skin: Negative.  Negative for rash.  Neurological: Negative.  Negative for dizziness and headaches.  All other systems reviewed and are negative.   Vitals:   12/12/22 0858  BP: 126/88  Pulse: 62  Temp: 97.9 F (36.6 C)  SpO2: 98%    Physical Exam Constitutional:      Appearance: Normal appearance.  HENT:     Head: Normocephalic.  Eyes:     Extraocular Movements: Extraocular movements intact.  Cardiovascular:     Rate and Rhythm: Normal rate.  Pulmonary:     Effort: Pulmonary effort is normal.  Musculoskeletal:     Comments: Left knee: Positive swelling.  No crepitation.  Stable in flexion and extension Mild medial tenderness  Skin:    General: Skin is warm and dry.     Capillary Refill: Capillary refill takes less than 2  seconds.  Neurological:     Mental Status: He is alert and oriented to person, place, and time.  Psychiatric:        Mood and Affect: Mood normal.        Behavior: Behavior normal.      ASSESSMENT & PLAN: A total of 32 minutes was spent with the patient and counseling/coordination of care regarding preparing for this visit, review of most recent office visit notes, review of all medications, differential diagnosis of chronic left knee pain and need for orthopedic evaluation, pain management, prognosis, documentation, and need for follow-up.  Problem List Items Addressed This Visit       Musculoskeletal and Integument   Internal derangement of left knee    Suspected meniscal tear. Recommend orthopedic evaluation Referral to sports medicine placed today      Relevant Orders   Ambulatory referral to Sports Medicine     Other   Acute pain of left knee - Primary    With swelling and tenderness. Needs orthopedic evaluation. Sports medicine evaluation available today Pain management discussed      Relevant Orders   Ambulatory referral to Sports Medicine   Patient Instructions  Acute Knee Pain, Adult Many things can cause knee pain. Sometimes, knee pain is sudden (acute) and may be caused by damage, swelling, or irritation of the muscles and tissues that support your knee. The pain often goes away on its own with time and rest. If the pain does not go away, tests may be done to find out what is causing the pain. Follow these instructions at home: If you have a knee sleeve or brace:  Wear the knee sleeve or brace as told by your doctor. Take it off only  as told by your doctor. Loosen it if your toes: Tingle. Become numb. Turn cold and blue. Keep it clean. If the knee sleeve or brace is not waterproof: Do not let it get wet. Cover it with a watertight covering when you take a bath or shower. Activity Rest your knee. Do not do things that cause pain or make pain  worse. Avoid activities where both feet leave the ground at the same time (high-impact activities). Examples are running, jumping rope, and doing jumping jacks. Work with a physical therapist to make a safe exercise program, as told by your doctor. Managing pain, stiffness, and swelling  If told, put ice on the knee. To do this: If you have a removable knee sleeve or brace, take it off as told by your doctor. Put ice in a plastic bag. Place a towel between your skin and the bag. Leave the ice on for 20 minutes, 2-3 times a day. Take off the ice if your skin turns bright red. This is very important. If you cannot feel pain, heat, or cold, you have a greater risk of damage to the area. If told, use an elastic bandage to put pressure (compression) on your injured knee. Raise your knee above the level of your heart while you are sitting or lying down. Sleep with a pillow under your knee. General instructions Take over-the-counter and prescription medicines only as told by your doctor. Do not smoke or use any products that contain nicotine or tobacco. If you need help quitting, ask your doctor. If you are overweight, work with your doctor and a food expert (dietitian) to set goals to lose weight. Being overweight can make your knee hurt more. Watch for any changes in your symptoms. Keep all follow-up visits. Contact a doctor if: The knee pain does not stop. The knee pain changes or gets worse. You have a fever along with knee pain. Your knee is red or feels warm when you touch it. Your knee gives out or locks up. Get help right away if: Your knee swells, and the swelling gets worse. You cannot move your knee. You have very bad knee pain that does not get better with pain medicine. Summary Many things can cause knee pain. The pain often goes away on its own with time and rest. Your doctor may do tests to find out the cause of the pain. Watch for any changes in your symptoms. Relieve your  pain with rest, medicines, light activity, and use of ice. Get help right away if you cannot move your knee or your knee pain is very bad. This information is not intended to replace advice given to you by your health care provider. Make sure you discuss any questions you have with your health care provider. Document Revised: 10/27/2019 Document Reviewed: 10/28/2019 Elsevier Patient Education  2024 Elsevier Inc.     Edwina Barth, MD [facility]

## 2022-12-13 LAB — SYNOVIAL FLUID ANALYSIS, COMPLETE
Basophils, %: 0 %
Eosinophils-Synovial: 0 % (ref 0–2)
Lymphocytes-Synovial Fld: 72 % (ref 0–74)
Monocyte/Macrophage: 10 % (ref 0–69)
Neutrophil, Synovial: 8 % (ref 0–24)
Synoviocytes, %: 0 % (ref 0–15)
WBC, Synovial: 1014 cells/uL — ABNORMAL HIGH (ref <150)

## 2022-12-14 ENCOUNTER — Ambulatory Visit
Admission: RE | Admit: 2022-12-14 | Discharge: 2022-12-14 | Disposition: A | Discharge status: Home / Self Care | Payer: BC Managed Care – PPO | Source: Ambulatory Visit | Attending: Sports Medicine | Admitting: Sports Medicine

## 2022-12-14 DIAGNOSIS — M1712 Unilateral primary osteoarthritis, left knee: Secondary | ICD-10-CM

## 2022-12-14 DIAGNOSIS — G8929 Other chronic pain: Secondary | ICD-10-CM

## 2022-12-14 LAB — ANAEROBIC AND AEROBIC CULTURE
AER RESULT:: NO GROWTH
MICRO NUMBER:: 15212004

## 2022-12-15 LAB — ANAEROBIC AND AEROBIC CULTURE

## 2022-12-18 LAB — ANAEROBIC AND AEROBIC CULTURE
AER RESULT:: NO GROWTH
MICRO NUMBER:: 15212004
SPECIMEN QUALITY:: ADEQUATE
SPECIMEN QUALITY:: ADEQUATE
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
## 2022-12-31 ENCOUNTER — Ambulatory Visit: Payer: BC Managed Care – PPO | Admitting: Sports Medicine

## 2022-12-31 VITALS — BP 118/84 | HR 71 | Ht 72.0 in | Wt 234.0 lb

## 2022-12-31 DIAGNOSIS — M25562 Pain in left knee: Secondary | ICD-10-CM

## 2022-12-31 DIAGNOSIS — S83232A Complex tear of medial meniscus, current injury, left knee, initial encounter: Secondary | ICD-10-CM | POA: Diagnosis not present

## 2022-12-31 DIAGNOSIS — M1712 Unilateral primary osteoarthritis, left knee: Secondary | ICD-10-CM

## 2022-12-31 DIAGNOSIS — G8929 Other chronic pain: Secondary | ICD-10-CM | POA: Diagnosis not present

## 2022-12-31 NOTE — Patient Instructions (Signed)
Ortho referral  As needed follow up

## 2023-01-04 ENCOUNTER — Other Ambulatory Visit (INDEPENDENT_AMBULATORY_CARE_PROVIDER_SITE_OTHER): Payer: BC Managed Care – PPO

## 2023-01-04 ENCOUNTER — Ambulatory Visit (HOSPITAL_BASED_OUTPATIENT_CLINIC_OR_DEPARTMENT_OTHER): Payer: BC Managed Care – PPO | Admitting: Orthopaedic Surgery

## 2023-01-04 ENCOUNTER — Encounter (HOSPITAL_BASED_OUTPATIENT_CLINIC_OR_DEPARTMENT_OTHER): Payer: Self-pay | Admitting: Orthopaedic Surgery

## 2023-01-04 DIAGNOSIS — K5000 Crohn's disease of small intestine without complications: Secondary | ICD-10-CM | POA: Diagnosis not present

## 2023-01-04 DIAGNOSIS — M1712 Unilateral primary osteoarthritis, left knee: Secondary | ICD-10-CM

## 2023-01-04 DIAGNOSIS — Z7960 Long term (current) use of unspecified immunomodulators and immunosuppressants: Secondary | ICD-10-CM | POA: Diagnosis not present

## 2023-01-04 LAB — COMPREHENSIVE METABOLIC PANEL
ALT: 13 U/L (ref 0–53)
AST: 13 U/L (ref 0–37)
Albumin: 4.2 g/dL (ref 3.5–5.2)
Alkaline Phosphatase: 61 U/L (ref 39–117)
BUN: 10 mg/dL (ref 6–23)
CO2: 27 mEq/L (ref 19–32)
Calcium: 9.3 mg/dL (ref 8.4–10.5)
Chloride: 105 mEq/L (ref 96–112)
Creatinine, Ser: 0.92 mg/dL (ref 0.40–1.50)
GFR: 91.05 mL/min (ref >60.00)
Glucose, Bld: 96 mg/dL (ref 70–99)
Potassium: 4.1 mEq/L (ref 3.5–5.1)
Sodium: 141 mEq/L (ref 135–145)
Total Bilirubin: 0.5 mg/dL (ref 0.2–1.2)
Total Protein: 7 g/dL (ref 6.0–8.3)

## 2023-01-04 LAB — CBC WITH DIFFERENTIAL/PLATELET
Basophils Absolute: 0 10*3/uL (ref 0.0–0.1)
Basophils Relative: 0.4 % (ref 0.0–3.0)
Eosinophils Absolute: 0.2 10*3/uL (ref 0.0–0.7)
Eosinophils Relative: 4.2 % (ref 0.0–5.0)
HCT: 41.8 % (ref 39.0–52.0)
Hemoglobin: 13.7 g/dL (ref 13.0–17.0)
Lymphocytes Relative: 27.2 % (ref 12.0–46.0)
Lymphs Abs: 1.6 10*3/uL (ref 0.7–4.0)
MCHC: 32.8 g/dL (ref 30.0–36.0)
MCV: 86.4 fl (ref 78.0–100.0)
Monocytes Absolute: 0.6 10*3/uL (ref 0.1–1.0)
Monocytes Relative: 10.5 % (ref 3.0–12.0)
Neutro Abs: 3.3 10*3/uL (ref 1.4–7.7)
Neutrophils Relative %: 57.7 % (ref 43.0–77.0)
Platelets: 250 10*3/uL (ref 150.0–400.0)
RBC: 4.83 Mil/uL (ref 4.22–5.81)
RDW: 13.7 % (ref 11.5–15.5)
WBC: 5.7 10*3/uL (ref 4.0–10.5)

## 2023-01-04 LAB — HIGH SENSITIVITY CRP: CRP, High Sensitivity: 3.96 mg/L (ref 0.000–5.000)

## 2023-01-04 LAB — SEDIMENTATION RATE: Sed Rate: 9 mm/hr (ref 0–20)

## 2023-01-04 NOTE — Progress Notes (Signed)
Chief Complaint: Left knee pain     History of Present Illness:     Randy Wilson is a 57 y.o. male presents with left knee pain which has been painful for the last several months.  He has been experiencing swelling and pain now after multiple aspirations and injections with Dr. Jean Rosenthal.  An MRI was performed which did show evidence of medial meniscal tearing as well as chondral loss involving predominantly the medial patellofemoral joints.  He has been taking anti-inflammatories without any relief.  His pain is predominantly over the medial portion of the knee.  He states that there is giving out him persistent swelling despite multiple injections now.  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  This has been quite limited due to his persistent knee pain.  He has trialed activity restrictions and NSAIDs without relief    Surgical History:   None  PMH/PSH/Family History/Social History/Meds/Allergies:    Past Medical History:  Diagnosis Date   Crohn's disease of small intestine (HCC) 2017   followed by dr h. danis;  s/p colon resection 11-02-2015,  on humira   History of asthma    child   History of colon polyps    History of COVID-19 05/30/2020   positive result in epicrunny nose/cough x 3 weeks all symptoms resolved   History of gout    last flare up 1 year ago per pt on 04-03-2021   OA (osteoarthritis)    Right wrist fracture    fell off ladder per pt on 02-08-2021   Right wrist fracture, closed, initial encounter 01/22/2021   Past Surgical History:  Procedure Laterality Date   ANKLE SURGERY Left 2002   bone spurs removed   COLONOSCOPY     last one 02-25-2020  by dr h. danis   HARDWARE REMOVAL Right 04/06/2021   Procedure: Right wrist removal of hardware;  Surgeon: [name], MD;  Location: [facility];  Service: Orthopedics;  Laterality: Right;  with local anesthesia Needs 30 minutes    LAPAROSCOPIC ILEOCECECTOMY N/A 11/02/2015   Procedure: LAPAROSCOPIC ILEOCECECTOMY;  Surgeon: [name], MD;  Location: [facility];  Service: General;  Laterality: N/A;   ORIF WRIST FRACTURE Right 02/09/2021   Procedure: OPEN REDUCTION INTERNAL FIXATION (ORIF) WRIST FRACTURE;  Surgeon: [name], MD;  Location: [facility];  Service: Orthopedics;  Laterality: Right;  WITH REGIONAL   SHOULDER ARTHROSCOPY W/ SUBACROMIAL DECOMPRESSION AND DISTAL CLAVICLE EXCISION Left 05/07/2008   @[facility]  by dr [name];   bone spurs removed   Social History   Socioeconomic History   Marital status: Married    Spouse name: Debbe Odea    Number of children: 3   Years of education: Not on file   Highest education level: High school graduate  Occupational History   Occupation: Recieving Solicitor    Comment: [employer]  Tobacco Use   Smoking status: Former    Current packs/day: 0.00    Average packs/day: 1 pack/day for 25.0 years (25.0 ttl pk-yrs)    Types: Cigarettes    Start date: 01/21/1981    Quit date: 01/21/2006    Years since quitting: 16.9   Smokeless tobacco: Never  Vaping Use   Vaping status: Never Used  Substance and Sexual Activity   Alcohol use: Yes  Alcohol/week: 6.0 standard drinks of alcohol    Types: 6 Cans of beer per week    Comment: [facility] beer   Drug use: Never   Sexual activity: Yes    Comment: with monogamous wife, she takes OCP  Other Topics Concern   Not on file  Social History Narrative   Pt is from [place].    Social Determinants of Health   Financial Resource Strain: Low Risk  (10/24/2017)   Overall Financial Resource Strain (CARDIA)    Difficulty of Paying Living Expenses: Not hard at all  Food Insecurity: No Food Insecurity (10/24/2017)   Hunger Vital Sign    Worried About Running Out of Food in the Last Year: Never true    Ran Out of Food in the Last Year: Never true  Transportation Needs: No Transportation Needs (10/24/2017)   PRAPARE - Therapist, art (Medical): No    Lack of Transportation (Non-Medical): No  Physical Activity: Sufficiently Active (10/24/2017)   Exercise Vital Sign    Days of Exercise per Week: 4 days    Minutes of Exercise per Session: 60 min  Stress: No Stress Concern Present (10/24/2017)   [employer] of Occupational Health - Occupational Stress Questionnaire    Feeling of Stress : Not at all  Social Connections: Moderately Integrated (10/24/2017)   Social Connection and Isolation Panel [NHANES]    Frequency of Communication with Friends and Family: More than three times a week    Frequency of Social Gatherings with Friends and Family: More than three times a week    Attends Religious Services: 1 to 4 times per year    Active Member of [employer] or Organizations: No    Attends Engineer, structural: Never    Marital Status: Married   Family History  Problem Relation Age of Onset   Asthma Mother 29       asthma attack    Asthma Sister    Stomach cancer Sister    Stroke Brother        Myocardial infarction   Stroke Brother    Colon cancer Neg Hx    Rectal cancer Neg Hx    Esophageal cancer Neg Hx    Colon polyps Neg Hx    Allergic rhinitis Neg Hx    Angioedema Neg Hx    Eczema Neg Hx    Urticaria Neg Hx    Liver cancer Neg Hx    Pancreatic cancer Neg Hx    Allergies  Allergen Reactions   Tramadol Itching   Shrimp [Shellfish Allergy] Hives   Current Outpatient Medications  Medication Sig Dispense Refill   allopurinol (ZYLOPRIM) 100 MG tablet TAKE 1 TABLET BY MOUTH EVERY DAY 90 tablet 3   cetirizine (ZYRTEC) 10 MG tablet Take 1 tablet (10 mg total) by mouth daily. (Patient taking differently: Take 10 mg by mouth as needed.) 30 tablet 11   diclofenac (VOLTAREN) 75 MG EC tablet Take 1 tablet (75 mg total) by mouth 2 (two) times daily. 30 tablet 0   EPINEPHrine 0.3 mg/0.3 mL IJ SOAJ injection Inject 0.3 mg into the muscle as needed for anaphylaxis. 1 each 3   tiZANidine  (ZANAFLEX) 4 MG tablet Take 1 tablet (4 mg total) by mouth every 8 (eight) hours as needed for muscle spasms. 60 tablet 1   ustekinumab (STELARA) 90 MG/ML SOSY injection Inject 1 mL (90 mg total) into the skin every 8 (eight) weeks. 1 mL 5   No  current facility-administered medications for this visit.   No results found.  Review of Systems:   A ROS was performed including pertinent positives and negatives as documented in the HPI.  Physical Exam :   Constitutional: NAD and appears stated age Neurological: Alert and oriented Psych: Appropriate affect and cooperative There were no vitals taken for this visit.   Comprehensive Musculoskeletal Exam:      Musculoskeletal Exam  Gait Normal  Alignment Normal   Right Left  Inspection Normal Normal  Palpation    Tenderness None Medial joint line, patellofemoral  Crepitus None None  Effusion none Positive  Range of Motion    Extension 0 0  Flexion 135 135  Strength    Extension 5/5 5/5  Flexion 5/5 5/5  Ligament Exam     Generalized Laxity No No  Lachman Negative Negative   Pivot Shift Negative Negative  Anterior Drawer Negative Negative  Valgus at 0 Negative Negative  Valgus at 20 Negative Negative  Varus at 0 0 0  Varus at 20   0 0  Posterior Drawer at 90 0 0  Vascular/Lymphatic Exam    Edema None None  Venous Stasis Changes No No  Distal Circulation Normal Normal  Neurologic    Light Touch Sensation Intact Intact  Special Tests:      Imaging:   Xray (4 views left knee): Mild medial degenerative findings  MRI (left knee): Moderate to advanced medial osteoarthritis as well as patellofemoral osteoarthritis involving the lateral patellar facet  I personally reviewed and interpreted the radiographs.   Assessment:   57 y.o. male with left knee pain in the setting of medial based patellofemoral chondral loss.  He does appear to have a degenerative appearing medial meniscal tear although I do believe he does have  significant traumatic chondral loss involving the medial joint line as well.  At his MRI there is evidence of significant trochlear involvement as well as lateral patellar facet chondromalacia and full-thickness chondral loss as well.  I did discuss treatment options.  At this time he has had multiple injections without significant relief and is hoping to defer any additional injections.  I did consider partial knee replacement versus total knee arthroplasty although given the fact that he does have full-thickness loss of cartilage involving the patellofemoral joint as well as the medial joint ultimately I do believe that he would benefit from total knee arthroplasty right a minimum a conversation with a total knee arthroplasty surgeon.  Will plan for referral to Dr. Magnus Ivan at this time for discussion  Plan :    -Plan for referral to Dr. Magnus Ivan for discussion of left total knee arthroplasty     I personally saw and evaluated the patient, and participated in the management and treatment plan.  Huel Cote, MD Attending Physician, Orthopedic Surgery  This document was dictated using Dragon voice recognition software. A reasonable attempt at proof reading has been made to minimize errors.

## 2023-01-09 ENCOUNTER — Ambulatory Visit: Payer: BC Managed Care – PPO | Admitting: Orthopaedic Surgery

## 2023-01-09 VITALS — Ht 72.0 in | Wt 234.0 lb

## 2023-01-09 DIAGNOSIS — M1712 Unilateral primary osteoarthritis, left knee: Secondary | ICD-10-CM | POA: Insufficient documentation

## 2023-01-09 DIAGNOSIS — M25562 Pain in left knee: Secondary | ICD-10-CM

## 2023-01-09 DIAGNOSIS — G8929 Other chronic pain: Secondary | ICD-10-CM

## 2023-01-09 NOTE — Progress Notes (Signed)
The patient is a 57 year old sent to me from Dr. Steward Drone due to debilitating arthritis involving the patient's left knee.  He is 57 years old.  He works in Production designer, theatre/television/film at [employer].  He has never had surgery on his knee but is very athletic growing up and has had minimal injuries here and there.  He started to have consistent swelling with his knee and after several aspirations a MRI was obtained.  The MRI shows areas of full-thickness cartilage loss of the patellofemoral joint and the medial compartment of his knee with chronic tearing of the medial meniscus.  He is interested in knee replacement surgery at this point given the fact that his left knee pain is daily and is detrimentally affecting his mobility, his quality of life and his actives daily living.  He is not on blood thinning medications.  He is not a diabetic.  I was able to review all of his medications and past medical history within epic.  Examination of his left knee does show a mild to moderate effusion today.  He said this has been aspirated several times.  It is recurrent.  He has good range of motion of the knee and it is ligamentously stable.  He has slight varus malalignment that is easily correctable.  There is medial joint line tenderness and patellofemoral tenderness and crepitation.  X-rays of his left knee shows just some slight medial compartment narrowing and significant patellofemoral narrowing.  However the MRI does show a complex tear that is degenerative nature of the medial meniscus.  There are signal changes in the lateral meniscus.  There are areas of full-thickness cartilage loss of the patellofemoral joint and the medial compartment of the knee at the weightbearing surface.  There is also a large Baker's cyst.  We have recommended knee replacement surgery to treat his knee arthritis.  He has failed conservative treatment for over 12 months now.  I showed him a knee replacement model and went over his imaging studies.  I  described the risks and benefits of the surgery and what to expect from an intraoperative and postoperative standpoint.  All questions and concerns were answered addressed.  He is hoping to get on the schedule soon.  We will be in touch.

## 2023-01-19 LAB — USTEKINUMAB AND ANTI-USTEK AB: Anti-Ustekinumab Antibody: 269 ng/mL

## 2023-01-21 ENCOUNTER — Encounter: Payer: Self-pay | Admitting: Gastroenterology

## 2023-01-21 ENCOUNTER — Other Ambulatory Visit (HOSPITAL_COMMUNITY): Payer: Self-pay

## 2023-01-21 ENCOUNTER — Telehealth: Payer: Self-pay

## 2023-01-21 MED ORDER — SKYRIZI 180 MG/1.2ML ~~LOC~~ SOCT: 1.2000 mL | SUBCUTANEOUS | 5 refills | Status: DC

## 2023-01-21 NOTE — Addendum Note (Signed)
Addended by: Missy Sabins on: 01/21/2023 09:51 AM   Modules accepted: Orders

## 2023-01-21 NOTE — Telephone Encounter (Signed)
Pharmacy Patient Advocate Encounter   Received notification from Pt Calls Messages that prior authorization for Skyrizi 180MG /1.2ML (150MG /ML) single-dose prefilled cartridge with on-body injector is required/requested.   Insurance verification completed.   The patient is insured through [facility] .   Per test claim: PA required; PA submitted to [facility] via CoverMyMeds Key/confirmation #/EOC [id number] Status is pending

## 2023-01-21 NOTE — Telephone Encounter (Signed)
Randy Wilson, will you please initiate PA for [facility] induction infusions as outlined below?  Monchell, will you please submit PA ASAP for Skyrizi maintenance dose as outlined below?   Thank you both!  Skyrizi induction orders entered in [employer]. Sky[facility]zi maintenance dose [facility]

## 2023-01-21 NOTE — Telephone Encounter (Signed)
-----   Message from Charlie Pitter III sent at 01/19/2023 11:31 AM EDT ----- Unfortunately, his stool study suggest there is ongoing Crohn's inflammation. 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  This means his immune system has created antibodies that are neutralizing the stelara factor, so that treatment is no longer going to work.  He previously had insufficient response to Humira, after which I wanted him to start [place].  His insurance declined the [place] in favor of Stelara.  He needs to stop the Stelara, and we need to work on a prior authorization for him to start [employer] induction infusions followed by maintenance subcutaneous treatments:  Induction: IV: 600 mg at weeks 0, 4, and 8. ([facility] [address] infusion center)  Maintenance: SUBQ: Prefilled cartridge: 180mg  at week 12 then every 8 weeks thereafter  If the Cristy Folks is declined, then we will need to request a peer to peer case review.   -H Danis

## 2023-01-22 ENCOUNTER — Other Ambulatory Visit (HOSPITAL_COMMUNITY): Payer: Self-pay

## 2023-01-22 NOTE — Telephone Encounter (Signed)
Pharmacy Patient Advocate Encounter   Received notification from [facility] that Prior Authorization for Skyrizi 180MG /1.2ML (150MG /ML) single-dose prefilled cartridge with on-body injector has been APPROVED from 01-21-2023 to 01-20-2024    PA #/Case ID/Reference #: [id number]

## 2023-01-23 NOTE — Telephone Encounter (Signed)
Pharmacy Patient Advocate Encounter   Received notification from [facility] that Prior Authorization for Skyrizi 180MG /1.2ML (150MG /ML) single-dose prefilled cartridge with on-body injector has been APPROVED from 01-21-2023 to 01-20-2024    PA #/Case ID/Reference #: [id number]

## 2023-01-23 NOTE — Telephone Encounter (Signed)
Noted, thank you

## 2023-01-29 NOTE — Telephone Encounter (Signed)
Kim, just wanted to follow up on request for PA - Skyrizi infusions ?

## 2023-01-31 ENCOUNTER — Other Ambulatory Visit: Payer: Self-pay | Admitting: Pharmacy Technician

## 2023-02-04 ENCOUNTER — Encounter: Payer: Self-pay | Admitting: Gastroenterology

## 2023-02-04 ENCOUNTER — Telehealth: Payer: Self-pay

## 2023-02-04 ENCOUNTER — Telehealth: Payer: Self-pay | Admitting: Gastroenterology

## 2023-02-04 NOTE — Telephone Encounter (Signed)
Brooklyn, Induction dosing is classified as a Medical Benefit and has its own step therapy and qualifications. (Separate from Pharmacy Benefit) (Medication will be administered in an office/infusion setting) Maintenance dosing is under the Pharmacy Benefit. (Medication be will self administered)  I have certainly seen cases where this has happened and understand the confusion/frustration. In this case, you may want to appeal the decision. The denial letter has been faxed to the Media tab for further review. If you would like to appeal please call [phone] opt 3, then opt 1.

## 2023-02-04 NOTE — Telephone Encounter (Signed)
Dr. Myrtie Neither, Randy Wilson has been denied for this patient because he needs to try and fail both an infliximab product and Stelara first.  Auth Submission: DENIED Site of care: Site of care: [facility] Payer: BCBS Medication & CPT/J Code(s) submitted: Skyrizi [place]) [id number] Route of submission (phone, fax, portal): Portal/Phone Phone # Fax # Auth type: Buy/Bill PB Units/visits requested:  Reference number: [id number]  Authorization has been DENIED because the patient must try and fail both an infliximab product such as Avsola or Inflectra and Stelara.

## 2023-02-04 NOTE — Telephone Encounter (Signed)
See 02/04/23 TE from [facility] infusion center for details - Skyrizi induction infusions have been denied.

## 2023-02-04 NOTE — Telephone Encounter (Signed)
Brooklyn,  I dictated an appeal letter regarding this formulary denial, and I routed it to you. Please send it to his insurance company ASAP for reconsideration.  If it is again denied, then request a physician to physician peer review.  Thank you  H Danis

## 2023-02-04 NOTE — Telephone Encounter (Signed)
Returned call to patient. I informed pt that we just received denial from his insurance regarding DENIAL of Skyrizi induction infusions. I informed pt that we will likely appeal since OBI was approved. I told pt to not begin OBI yet since he will need induction infusions first. Pt is aware that he will be contacted once we have more information regarding appeal. Pt verbalized understanding and had no concerns at the end of the call.

## 2023-02-04 NOTE — Telephone Encounter (Signed)
Inbound call from from patient wishing to speak about Skyrizi medication. Patient states he is unsure when or where he should receiving his infusion. Patient requesting a call back. Please advise, thank you.

## 2023-02-04 NOTE — Telephone Encounter (Signed)
Good morning, is someone able to clarify how the Cristy Folks OBI was approved on 01/21/23, but induction infusions are denied? Thanks See 01/21/23 telephone encounter from pharmacy team.

## 2023-02-05 NOTE — Telephone Encounter (Signed)
Letter printed and faxed to [facility] - Appeals Department Level 1 at [phone].

## 2023-02-06 ENCOUNTER — Other Ambulatory Visit: Payer: Self-pay

## 2023-02-06 NOTE — Progress Notes (Signed)
Sent message, via epic in basket, requesting orders in epic from surgeon.  

## 2023-02-07 NOTE — Telephone Encounter (Addendum)
[facility] called to ask if the patient had an IV starter before they are able to ship Skyrizi medication. Tel: [phone]

## 2023-02-08 NOTE — Telephone Encounter (Addendum)
Skyrizi infusions denied, appeal is in process. [facility]ed [facility] and spoke with pharmacist Arabella Merles. I confirmed that pt has NOT received his 3 induction infusions at this time. We are working on appeal. Arabella Merles informed me that she will contact pt and let him know that maintenance order is being cancelled at this time until he receives his infusions. Leena informed me that once infusions are complete pt can contact the pharmacy via telephone or can schedule shipment through the app. Arabella Merles had no other concerns at the end of the call.

## 2023-02-11 NOTE — Telephone Encounter (Signed)
Appeal has been denied - official denial letter placed in your office IN box.

## 2023-02-11 NOTE — Telephone Encounter (Addendum)
Disappointing to say the least.  Please let Randy Wilson know about these recent decisions by his insurance company and our current appeal.  (Denial letter still states member has not tried an infliximab product.  Denial staff include "clinical Audiological scientist, RN" and "plan medical director, MD, FACS, Board certified in general surgery, certified professional coder)  Please request a physician peer to peer review, and state that I am requesting that peer to peer to be handled by a board-certified gastroenterologist.   Ellwood Dense, MD

## 2023-02-12 NOTE — Progress Notes (Signed)
COVID Vaccine received:  []  No [x]  Yes Date of any COVID positive Test in last 90 days: No PCP - Edwina Barth MD Cardiologist - No  Chest x-ray -  EKG -   Stress Test -  ECHO -  Cardiac Cath -   Bowel Prep - [x]  No  []   Yes ______  Pacemaker / ICD device [x]  No []  Yes   Spinal Cord Stimulator:[x]  No []  Yes       History of Sleep Apnea? [x]  No []  Yes   CPAP used?- [x]  No []  Yes    Does the patient monitor blood sugar?          [x]  No []  Yes  []  N/A  Patient has: [x]  NO Hx DM   []  Pre-DM                 []  DM1  []   DM2 Does patient have a [employer] or Dexacom? []  No []  Yes   Fasting Blood Sugar Ranges-  Checks Blood Sugar _____ times a day  GLP1 agonist / usual dose - No GLP1 instructions:  SGLT-2 inhibitors / usual dose - No SGLT-2 instructions:   Blood Thinner / Instructions:No Aspirin Instructions:No  Comments:   Activity level: Patient is able to climb a flight of stairs without difficulty; [x]  No CP  [x]  No SOB, but would have _knee pain__   Patient can perform ADLs without assistance.   Anesthesia review:   Patient denies shortness of breath, fever, cough and chest pain at PAT appointment.  Patient verbalized understanding and agreement to the Pre-Surgical Instructions that were given to them at this PAT appointment. Patient was also educated of the need to review these PAT instructions again prior to his/her surgery.I reviewed the appropriate phone numbers to call if they have any and questions or concerns.

## 2023-02-12 NOTE — Telephone Encounter (Signed)
Patient called and provided verbal consent to proceed to sign on his behalf. Thank you

## 2023-02-12 NOTE — Telephone Encounter (Signed)
Called and left patient a detailed vm letting him know that the Level 1 appeal was denied for Skyrizi induction infusions. I advised pt that his insurance did say that we can submit a Level 2 appeal on his behalf. Pt will need to provide authorization for [place] to represent him, I advised pt that he will need to sign form or he can give [place] permission to sign on his behalf. I advised pt to call me back to discuss and I will also send this information via MyChart.   Called [employer] and spoke with Crystal L. I was informed that a peer to peer will not be able to be submitted, next step will be a Level 2 appeal. Crystal guided me to the necessary forms that we will need to initiate this process. Dr. Myrtie Neither and patient have been notified.

## 2023-02-12 NOTE — Telephone Encounter (Signed)
Noted, see 9/9 telephone encounter from [facility] for details

## 2023-02-12 NOTE — Telephone Encounter (Signed)
Dr. Myrtie Neither, please note below. Thanks

## 2023-02-12 NOTE — Patient Instructions (Signed)
SURGICAL WAITING ROOM VISITATION  Patients having surgery or a procedure may have no more than 2 support people in the waiting area - these visitors may rotate.    Children under the age of 58 must have an adult with them who is not the patient.  Due to an increase in RSV and influenza rates and associated hospitalizations, children ages 26 and under may not visit patients in [facility] hospitals.  If the patient needs to stay at the hospital during part of their recovery, the visitor guidelines for inpatient rooms apply. Pre-op nurse will coordinate an appropriate time for 1 support person to accompany patient in pre-op.  This support person may not rotate.    Please refer to the [facility] website for the visitor guidelines for Inpatients (after your surgery is over and you are in a regular room).       Your procedure is scheduled on: 02/22/23   Report to [facility] Main Entrance    Report to admitting at 11:15 AM   Call this number if you have problems the morning of surgery [phone]   Do not eat food :After Midnight.   After Midnight you may have the following liquids until 10:45 AM DAY OF SURGERY  Water Non-Citrus Juices (without pulp, NO RED-Apple, White grape, White cranberry) Black Coffee (NO MILK/CREAM OR CREAMERS, sugar ok)  Clear Tea (NO MILK/CREAM OR CREAMERS, sugar ok) regular and decaf                             Plain Jell-O (NO RED)                                           Fruit ices (not with fruit pulp, NO RED)                                     Popsicles (NO RED)                                                               Sports drinks like Gatorade (NO RED)                  The day of surgery:  Drink ONE (1) Pre-Surgery Clear Ensure at 10:45 AM the morning of surgery. Drink in one sitting. Do not sip.  This drink was given to you during your hospital  pre-op appointment visit. Nothing else to drink after completing the  Pre-Surgery  Clear Ensure     Oral Hygiene is also important to reduce your risk of infection.                                    Remember - BRUSH YOUR TEETH THE MORNING OF SURGERY WITH YOUR REGULAR TOOTHPASTE  DENTURES WILL BE REMOVED PRIOR TO SURGERY PLEASE DO NOT APPLY "Poly grip" OR ADHESIVES!!!   Stop all vitamins and herbal supplements 7 days before surgery.   Take these medicines the  morning of surgery: Allopurinol, Cetirizine              You may not have any metal on your body including hair pins, jewelry, and body piercing             Do not wear make-up, lotions, powders, perfumes/cologne, or deodorant              Men may shave face and neck.   Do not bring valuables to the hospital.  IS NOT             RESPONSIBLE   FOR VALUABLES.   Contacts, glasses, dentures or bridgework may not be worn into surgery.   Bring small overnight bag day of surgery.   DO NOT BRING YOUR HOME MEDICATIONS TO THE HOSPITAL. PHARMACY WILL DISPENSE MEDICATIONS LISTED ON YOUR MEDICATION LIST TO YOU DURING YOUR ADMISSION IN THE HOSPITAL!    Patients discharged on the day of surgery will not be allowed to drive home.  Someone NEEDS to stay with you for the first 24 hours after anesthesia.   Special Instructions: Bring a copy of your healthcare power of attorney and living will documents the day of surgery if you haven't scanned them before.              Please read over the following fact sheets you were given: IF YOU HAVE QUESTIONS ABOUT YOUR PRE-OP INSTRUCTIONS PLEASE CALL [phone] Randy Wilson   If you received a COVID test during your pre-op visit  it is requested that you wear a mask when out in public, stay away from anyone that may not be feeling well and notify your surgeon if you develop symptoms. If you test positive for Covid or have been in contact with anyone that has tested positive in the last 10 days please notify you surgeon.      Pre-operative 5 CHG Wilson Instructions   You can  play a key role in reducing the risk of infection after surgery. Your skin needs to be as free of germs as possible. You can reduce the number of germs on your skin by washing with CHG (chlorhexidine gluconate) soap before surgery. CHG is an antiseptic soap that kills germs and continues to kill germs even after washing.   DO NOT use if you have an allergy to chlorhexidine/CHG or antibacterial soaps. If your skin becomes reddened or irritated, stop using the CHG and notify one of our RNs at [phone].   Please shower with the CHG soap starting 4 days before surgery using the following schedule:     Please keep in mind the following:  DO NOT shave, including legs and underarms, starting the day of your first shower.   You may shave your face at any point before/day of surgery.  Place clean sheets on your bed the day you start using CHG soap. Use a clean washcloth (not used since being washed) for each shower. DO NOT sleep with pets once you start using the CHG.   CHG Shower Instructions:  If you choose to wash your hair and private area, wash first with your normal shampoo/soap.  After you use shampoo/soap, rinse your hair and body thoroughly to remove shampoo/soap residue.  Turn the water OFF and apply about 3 tablespoons (45 ml) of CHG soap to a CLEAN washcloth.  Apply CHG soap ONLY FROM YOUR NECK DOWN TO YOUR TOES (washing for 3-5 minutes)  DO NOT use CHG soap on face, private areas, open wounds, or  sores.  Pay special attention to the area where your surgery is being performed.  If you are having back surgery, having someone wash your back for you may be helpful. Wait 2 minutes after CHG soap is applied, then you may rinse off the CHG soap.  Pat dry with a clean towel  Put on clean clothes/pajamas   If you choose to wear lotion, please use ONLY the CHG-compatible lotions on the back of this paper.     Additional instructions for the day of surgery: DO NOT APPLY any lotions,  deodorants, cologne, or perfumes.   Put on clean/comfortable clothes.  Brush your teeth.  Ask your nurse before applying any prescription medications to the skin.      CHG Compatible Lotions   Aveeno Moisturizing lotion  Cetaphil Moisturizing Cream  Cetaphil Moisturizing Lotion  Clairol Herbal Essence Moisturizing Lotion, Dry Skin  Clairol Herbal Essence Moisturizing Lotion, Extra Dry Skin  Clairol Herbal Essence Moisturizing Lotion, Normal Skin  Curel Age Defying Therapeutic Moisturizing Lotion with Alpha Hydroxy  Curel Extreme Care Body Lotion  Curel Soothing Hands Moisturizing Hand Lotion  Curel Therapeutic Moisturizing Cream, Fragrance-Free  Curel Therapeutic Moisturizing Lotion, Fragrance-Free  Curel Therapeutic Moisturizing Lotion, Original Formula  Eucerin Daily Replenishing Lotion  Eucerin Dry Skin Therapy Plus Alpha Hydroxy Crme  Eucerin Dry Skin Therapy Plus Alpha Hydroxy Lotion  Eucerin Original Crme  Eucerin Original Lotion  Eucerin Plus Crme Eucerin Plus Lotion  Eucerin TriLipid Replenishing Lotion  Keri Anti-Bacterial Hand Lotion  Keri Deep Conditioning Original Lotion Dry Skin Formula Softly Scented  Keri Deep Conditioning Original Lotion, Fragrance Free Sensitive Skin Formula  Keri Lotion Fast Absorbing Fragrance Free Sensitive Skin Formula  Keri Lotion Fast Absorbing Softly Scented Dry Skin Formula  Keri Original Lotion  Keri Skin Renewal Lotion Keri Silky Smooth Lotion  Keri Silky Smooth Sensitive Skin Lotion  Nivea Body Creamy Conditioning Oil  Nivea Body Extra Enriched Lotion  Nivea Body Original Lotion  Nivea Body Sheer Moisturizing Lotion Nivea Crme  Nivea Skin Firming Lotion  NutraDerm 30 Skin Lotion  NutraDerm Skin Lotion  NutraDerm Therapeutic Skin Cream  NutraDerm Therapeutic Skin Lotion  ProShield Protective Hand Cream    Incentive Spirometer (Watch this video at home: [email or web address])  An incentive  spirometer is a tool that can help keep your lungs clear and active. This tool measures how well you are filling your lungs with each breath. Taking long deep breaths may help reverse or decrease the chance of developing breathing (pulmonary) problems (especially infection) following: A long period of time when you are unable to move or be active. BEFORE THE PROCEDURE  If the spirometer includes an indicator to show your best effort, your nurse or respiratory therapist will set it to a desired goal. If possible, sit up straight or lean slightly forward. Try not to slouch. Hold the incentive spirometer in an upright position. INSTRUCTIONS FOR USE  Sit on the edge of your bed if possible, or sit up as far as you can in bed or on a chair. Hold the incentive spirometer in an upright position. Breathe out normally. Place the mouthpiece in your mouth and seal your lips tightly around it. Breathe in slowly and as deeply as possible, raising the piston or the ball toward the top of the column. Hold your breath for 3-5 seconds or for as long as possible. Allow the piston or ball to fall to the bottom of the column. Remove the mouthpiece from your mouth and breathe  out normally. Rest for a few seconds and repeat Steps 1 through 7 at least 10 times every 1-2 hours when you are awake. Take your time and take a few normal breaths between deep breaths. The spirometer may include an indicator to show your best effort. Use the indicator as a goal to work toward during each repetition. After each set of 10 deep breaths, practice coughing to be sure your lungs are clear. If you have an incision (the cut made at the time of surgery), support your incision when coughing by placing a pillow or rolled up towels firmly against it. Once you are able to get out of bed, walk around indoors and cough well. You may stop using the incentive spirometer when instructed by your caregiver.  RISKS AND COMPLICATIONS Take your time  so you do not get dizzy or light-headed. If you are in pain, you may need to take or ask for pain medication before doing incentive spirometry. It is harder to take a deep breath if you are having pain. AFTER USE Rest and breathe slowly and easily. It can be helpful to keep track of a log of your progress. Your caregiver can provide you with a simple table to help with this. If you are using the spirometer at home, follow these instructions: SEEK MEDICAL CARE IF:  You are having difficultly using the spirometer. You have trouble using the spirometer as often as instructed. Your pain medication is not giving enough relief while using the spirometer. You develop fever of 100.5 F (38.1 C) or higher. SEEK IMMEDIATE MEDICAL CARE IF:  You cough up bloody sputum that had not been present before. You develop fever of 102 F (38.9 C) or greater. You develop worsening pain at or near the incision site. MAKE SURE YOU:  Understand these instructions. Will watch your condition. Will get help right away if you are not doing well or get worse. Document Released: 09/24/2006 Document Revised: 08/06/2011 Document Reviewed: 11/25/2006 [facility] Patient Information 2014 [place], [place].

## 2023-02-13 ENCOUNTER — Encounter (HOSPITAL_COMMUNITY): Payer: Self-pay

## 2023-02-13 ENCOUNTER — Encounter (HOSPITAL_COMMUNITY)
Admission: RE | Admit: 2023-02-13 | Discharge: 2023-02-13 | Disposition: A | Discharge status: Home / Self Care | Payer: BC Managed Care – PPO | Source: Ambulatory Visit | Attending: Orthopaedic Surgery | Admitting: Orthopaedic Surgery

## 2023-02-13 ENCOUNTER — Other Ambulatory Visit: Payer: Self-pay

## 2023-02-13 VITALS — BP 140/90 | HR 70 | Temp 98.2°F | Resp 18 | Ht 74.0 in | Wt 230.4 lb

## 2023-02-13 DIAGNOSIS — Z01818 Encounter for other preprocedural examination: Secondary | ICD-10-CM

## 2023-02-13 DIAGNOSIS — M1712 Unilateral primary osteoarthritis, left knee: Secondary | ICD-10-CM | POA: Diagnosis not present

## 2023-02-13 DIAGNOSIS — K5000 Crohn's disease of small intestine without complications: Secondary | ICD-10-CM | POA: Diagnosis not present

## 2023-02-13 DIAGNOSIS — Z01812 Encounter for preprocedural laboratory examination: Secondary | ICD-10-CM | POA: Insufficient documentation

## 2023-02-13 LAB — CBC
HCT: 41.9 % (ref 39.0–52.0)
Hemoglobin: 14 g/dL (ref 13.0–17.0)
MCH: 28.6 pg (ref 26.0–34.0)
MCHC: 33.4 g/dL (ref 30.0–36.0)
MCV: 85.5 fL (ref 80.0–100.0)
Platelets: 245 10*3/uL (ref 150–400)
RBC: 4.9 MIL/uL (ref 4.22–5.81)
RDW: 12.6 % (ref 11.5–15.5)
WBC: 6.2 10*3/uL (ref 4.0–10.5)
nRBC: 0 % (ref 0.0–0.2)

## 2023-02-13 LAB — BASIC METABOLIC PANEL WITH GFR
Anion gap: 11 (ref 5–15)
BUN: 15 mg/dL (ref 6–20)
CO2: 23 mmol/L (ref 22–32)
Calcium: 9.2 mg/dL (ref 8.9–10.3)
Chloride: 106 mmol/L (ref 98–111)
Creatinine, Ser: 0.91 mg/dL (ref 0.61–1.24)
GFR, Estimated: >60 mL/min (ref >60)
Glucose, Bld: 110 mg/dL — ABNORMAL HIGH (ref 70–99)
Potassium: 4 mmol/L (ref 3.5–5.1)
Sodium: 140 mmol/L (ref 135–145)

## 2023-02-13 LAB — SURGICAL PCR SCREEN
MRSA, PCR: NEGATIVE
Staphylococcus aureus: POSITIVE — AB

## 2023-02-13 NOTE — Telephone Encounter (Signed)
Thank you for the update.  An even more disappointing development that this patient's insurer flatly declined my request for a peer to peer review by a board-certified gastroenterologist.  Please let me know what form I need to complete or letter I need to compose for the "level 2 appeal".  I will do my best to explain to these individuals why their medication denial decision is incorrect.  H Danis

## 2023-02-13 NOTE — Telephone Encounter (Signed)
Letter is complete and with the other forms in my office.  Thank you  - HD

## 2023-02-13 NOTE — Telephone Encounter (Signed)
I have placed the forms in your office in box. The only request is reason for an appeal. You will just compose a letter with information that you feel is pertinent to the denial. Nothing specific is requested for Level 2 appeal.

## 2023-02-15 NOTE — Progress Notes (Signed)
Please review PCR result from PST visit 02/13/23

## 2023-02-15 NOTE — Telephone Encounter (Signed)
Letter and forms have been faxed to [employer] Rights and Appeals department at [phone].

## 2023-02-18 NOTE — Telephone Encounter (Signed)
Received a fax that Level II panel appeal meeting will be held today at 12:15 pm by telephone conference. Dr. Myrtie Neither has been made aware. Will await fax with outcome of Level II appeal.

## 2023-02-19 NOTE — Telephone Encounter (Signed)
Called and left patient a detailed vm letting him know that we have scheduled him for a follow up with Dr. Myrtie Neither on 05/30/23 at 8:20 am. I advised that appt information is available in MyChart and I will mail a copy to him as well. I advised pt to call back at his convenience to reschedule if appt does not work for him.

## 2023-02-19 NOTE — Telephone Encounter (Signed)
Received a fax from [employer] that Skyrizi Level II appeal has been APPROVED. The approval has been scanned into patient's chart under Media.

## 2023-02-19 NOTE — Telephone Encounter (Signed)
This is great news.  Mr. Randy Wilson and I were both on an insurance company medicine review board conference call yesterday to try making this happen for him.  He needs an in person clinic visit with me in December or January.  H Danis

## 2023-02-19 NOTE — Telephone Encounter (Addendum)
[place], Patient will be scheduled as soon as possible.  Auth Submission: APPROVED - denial has been approved Site of care: Site of care: [facility] Payer: [employer] STATE Medication & CPT/J Code(s) submitted: Skyrizi Merlyn Albert) [phone] Route of submission (phone, fax, portal):  Phone # Fax # Auth type: Buy/Bill PB Units/visits requested: X3 Reference number: [id number] - overturned denial Approval from: 02/19/23 to 05/19/24

## 2023-02-19 NOTE — Telephone Encounter (Signed)
Called and left patient a detailed vm letting him know that appeal was overturned and Skyrizi infusion were approved. Pt should expect a call from [address] infusion center. Pt has been advised that once he has completed his 3rd infusion he should contact his specialty pharmacy so that delivery of his maintenance dose can be scheduled. I advised pt that I will send this information to his MyChart as well.

## 2023-02-21 NOTE — H&P (Signed)
TOTAL KNEE ADMISSION H&P  Patient is being admitted for left total knee arthroplasty.  Subjective:  Chief Complaint:left knee pain.  HPI: Randy Wilson, 57 y.o. male, has a history of pain and functional disability in the left knee due to arthritis and has failed non-surgical conservative treatments for greater than 12 weeks to includeNSAID's and/or analgesics, corticosteriod injections, and activity modification.  Onset of symptoms was gradual, starting 1 years ago with gradually worsening course since that time. The patient noted no past surgery on the left knee(s).  Patient currently rates pain in the left knee(s) at 10 out of 10 with activity. Patient has night pain, worsening of pain with activity and weight bearing, pain that interferes with activities of daily living, pain with passive range of motion, crepitus, and joint swelling.  Patient has evidence of subchondral sclerosis, periarticular osteophytes, and joint space narrowing by imaging studies. There is no active infection.  Patient Active Problem List   Diagnosis Date Noted   Unilateral primary osteoarthritis, left knee 01/09/2023   Internal derangement of left knee 12/12/2022   Hyperpigmented skin lesion 10/09/2022   Acute pain of left knee 08/01/2022   Dyslipidemia 03/08/2022   Crohn's disease of small intestine (HCC) 03/01/2022   Residual hemorrhoidal skin tags 03/31/2020   Prediabetes 01/20/2020   Erectile dysfunction 01/20/2020   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   Asthma 04/24/2015   Hx of colonic polyp 11/08/2014   History of colonic polyps 09/17/2014   Past Medical History:  Diagnosis Date   Crohn's disease of small intestine (HCC) 2017   followed by dr h. danis;  s/p colon resection 11-02-2015,  on humira   History of asthma    child   History of colon polyps    History of COVID-19 05/30/2020   positive result in epicrunny nose/cough x 3 weeks all symptoms resolved   History of gout    last flare up  1 year ago per pt on 04-03-2021   OA (osteoarthritis)    Right wrist fracture    fell off ladder per pt on 02-08-2021   Right wrist fracture, closed, initial encounter 01/22/2021    Past Surgical History:  Procedure Laterality Date   ANKLE SURGERY Left 2002   bone spurs removed   COLONOSCOPY     last one 02-25-2020  by dr h. danis   HARDWARE REMOVAL Right 04/06/2021   Procedure: Right wrist removal of hardware;  Surgeon: [name], MD;  Location: [facility] ([place]  Service: Orthopedics;  Laterality: Right;  with local anesthesia Needs 30 minutes   LAPAROSCOPIC ILEOCECECTOMY N/A 11/02/2015   Procedure: LAPAROSCOPIC ILEOCECECTOMY;  Surgeon: [name], MD;  Location: [facility];  Service: General;  Laterality: N/A;   ORIF WRIST FRACTURE Right 02/09/2021   Procedure: OPEN REDUCTION INTERNAL FIXATION (ORIF) WRIST FRACTURE;  Surgeon: [name], MD;  Location: [facility] [place];  Service: Orthopedics;  Laterality: Right;  WITH REGIONAL   ROTATOR CUFF REPAIR Bilateral    SHOULDER ARTHROSCOPY W/ SUBACROMIAL DECOMPRESSION AND DISTAL CLAVICLE EXCISION Left 05/07/2008   @[facility]  by dr [name];   bone spurs removed    No current facility-administered medications for this encounter.   Current Outpatient Medications  Medication Sig Dispense Refill Last Dose   allopurinol (ZYLOPRIM) 100 MG tablet TAKE 1 TABLET BY MOUTH EVERY DAY 90 tablet 3    cetirizine (ZYRTEC) 10 MG tablet Take 1 tablet (10 mg total) by mouth daily. (Patient taking differently: Take 10 mg by mouth  daily as needed for allergies.) 30 tablet 11    EPINEPHrine 0.3 mg/0.3 mL IJ SOAJ injection Inject 0.3 mg into the muscle as needed for anaphylaxis. 1 each 3    tetrahydrozoline-zinc (VISINE-AC) 0.05-0.25 % ophthalmic solution Place 2 drops into both eyes 3 (three) times daily as needed (irritated eyes).      diclofenac (VOLTAREN) 75 MG EC tablet Take 1 tablet (75 mg total) by mouth 2 (two) times daily. (Patient not  taking: Reported on 02/08/2023) 30 tablet 0 Not Taking   Risankizumab-rzaa (SKYRIZI) 180 MG/1.2ML SOCT Inject 1.2 mLs into the skin every 8 (eight) weeks. BEGIN AT WEEK 12 1.2 mL 5    tiZANidine (ZANAFLEX) 4 MG tablet Take 1 tablet (4 mg total) by mouth every 8 (eight) hours as needed for muscle spasms. (Patient not taking: Reported on 02/08/2023) 60 tablet 1 Not Taking   Allergies  Allergen Reactions   Tramadol Itching   Shrimp [Shellfish Allergy] Hives    Social History   Tobacco Use   Smoking status: Former    Current packs/day: 0.00    Average packs/day: 1 pack/day for 25.0 years (25.0 ttl pk-yrs)    Types: Cigarettes    Start date: 01/21/1981    Quit date: 01/21/2006    Years since quitting: 17.0   Smokeless tobacco: Never  Substance Use Topics   Alcohol use: Yes    Alcohol/week: 6.0 standard drinks of alcohol    Types: 6 Cans of beer per week    Comment: OCC beer    Family History  Problem Relation Age of Onset   Asthma Mother 17       asthma attack    Asthma Sister    Stomach cancer Sister    Stroke Brother        Myocardial infarction   Stroke Brother    Colon cancer Neg Hx    Rectal cancer Neg Hx    Esophageal cancer Neg Hx    Colon polyps Neg Hx    Allergic rhinitis Neg Hx    Angioedema Neg Hx    Eczema Neg Hx    Urticaria Neg Hx    Liver cancer Neg Hx    Pancreatic cancer Neg Hx      Review of Systems  All other systems reviewed and are negative.   Objective:  Physical Exam Vitals reviewed.  Constitutional:      Appearance: Normal appearance. He is normal weight.  HENT:     Head: Normocephalic and atraumatic.  Eyes:     Extraocular Movements: Extraocular movements intact.     Pupils: Pupils are equal, round, and reactive to light.  Cardiovascular:     Rate and Rhythm: Normal rate and regular rhythm.     Pulses: Normal pulses.  Pulmonary:     Effort: Pulmonary effort is normal.     Breath sounds: Normal breath sounds.  Abdominal:      Palpations: Abdomen is soft.  Musculoskeletal:     Cervical back: Normal range of motion and neck supple.     Left knee: Bony tenderness and crepitus present. Tenderness present over the medial joint line and lateral joint line. Abnormal alignment and abnormal meniscus.  Neurological:     Mental Status: He is alert and oriented to person, place, and time.  Psychiatric:        Behavior: Behavior normal.     Vital signs in last 24 hours:    Labs:   Estimated body mass index is 29.58 kg/m  as calculated from the following:   Height as of 02/13/23: 6\' 2"  (1.88 m).   Weight as of 02/13/23: 104.5 kg.   Imaging Review Plain radiographs demonstrate severe degenerative joint disease of the left knee(s). The overall alignment ismild varus. The bone quality appears to be excellent for age and reported activity level.      Assessment/Plan:  End stage arthritis, left knee   The patient history, physical examination, clinical judgment of the provider and imaging studies are consistent with end stage degenerative joint disease of the left knee(s) and total knee arthroplasty is deemed medically necessary. The treatment options including medical management, injection therapy arthroscopy and arthroplasty were discussed at length. The risks and benefits of total knee arthroplasty were presented and reviewed. The risks due to aseptic loosening, infection, stiffness, patella tracking problems, thromboembolic complications and other imponderables were discussed. The patient acknowledged the explanation, agreed to proceed with the plan and consent was signed. Patient is being admitted for inpatient treatment for surgery, pain control, PT, OT, prophylactic antibiotics, VTE prophylaxis, progressive ambulation and ADL's and discharge planning. The patient is planning to be discharged home with home health services

## 2023-02-22 ENCOUNTER — Observation Stay (HOSPITAL_COMMUNITY): Payer: BC Managed Care – PPO

## 2023-02-22 ENCOUNTER — Ambulatory Visit (HOSPITAL_COMMUNITY): Payer: BC Managed Care – PPO | Admitting: Registered Nurse

## 2023-02-22 ENCOUNTER — Other Ambulatory Visit: Payer: Self-pay

## 2023-02-22 ENCOUNTER — Encounter (HOSPITAL_COMMUNITY): Payer: Self-pay | Admitting: Orthopaedic Surgery

## 2023-02-22 ENCOUNTER — Observation Stay (HOSPITAL_COMMUNITY)
Admission: RE | Admit: 2023-02-22 | Discharge: 2023-02-23 | Disposition: A | Discharge status: Home / Self Care | Payer: BC Managed Care – PPO | Source: Ambulatory Visit | Attending: Orthopaedic Surgery | Admitting: Orthopaedic Surgery

## 2023-02-22 ENCOUNTER — Encounter (HOSPITAL_COMMUNITY)
Admission: RE | Disposition: A | Discharge status: Home / Self Care | Payer: Self-pay | Source: Ambulatory Visit | Attending: Orthopaedic Surgery

## 2023-02-22 DIAGNOSIS — J45909 Unspecified asthma, uncomplicated: Secondary | ICD-10-CM | POA: Diagnosis not present

## 2023-02-22 DIAGNOSIS — Z96652 Presence of left artificial knee joint: Secondary | ICD-10-CM

## 2023-02-22 DIAGNOSIS — Z8616 Personal history of COVID-19: Secondary | ICD-10-CM | POA: Insufficient documentation

## 2023-02-22 DIAGNOSIS — Z87891 Personal history of nicotine dependence: Secondary | ICD-10-CM | POA: Insufficient documentation

## 2023-02-22 DIAGNOSIS — M1712 Unilateral primary osteoarthritis, left knee: Secondary | ICD-10-CM

## 2023-02-22 DIAGNOSIS — Z79899 Other long term (current) drug therapy: Secondary | ICD-10-CM | POA: Insufficient documentation

## 2023-02-22 HISTORY — PX: TOTAL KNEE ARTHROPLASTY: SHX125

## 2023-02-22 SURGERY — ARTHROPLASTY, KNEE, TOTAL
Anesthesia: Monitor Anesthesia Care | Site: Knee | Laterality: Left

## 2023-02-22 MED ORDER — ACETAMINOPHEN 325 MG PO TABS: 325.0000 mg | ORAL_TABLET | Freq: Four times a day (QID) | ORAL | Status: DC | PRN

## 2023-02-22 MED ORDER — LIDOCAINE HCL (PF) 2 % IJ SOLN
INTRAMUSCULAR | Status: AC
  Filled 2023-02-22: qty 5

## 2023-02-22 MED ORDER — DEXAMETHASONE SODIUM PHOSPHATE 10 MG/ML IJ SOLN
INTRAMUSCULAR | Status: DC | PRN
Start: 2023-02-22 — End: 2023-02-22
  Administered 2023-02-22: 8 mg via INTRAVENOUS

## 2023-02-22 MED ORDER — OXYCODONE HCL 5 MG PO TABS
5.0000 mg | ORAL_TABLET | ORAL | Status: DC | PRN
  Administered 2023-02-22: 10 mg via ORAL
  Filled 2023-02-22: qty 2

## 2023-02-22 MED ORDER — ROPIVACAINE HCL 7.5 MG/ML IJ SOLN
INTRAMUSCULAR | Status: DC | PRN
Start: 2023-02-22 — End: 2023-02-22
  Administered 2023-02-22: 20 mL via PERINEURAL

## 2023-02-22 MED ORDER — LIDOCAINE 2% (20 MG/ML) 5 ML SYRINGE
INTRAMUSCULAR | Status: DC | PRN
  Administered 2023-02-22: 50 mg via INTRAVENOUS

## 2023-02-22 MED ORDER — DOCUSATE SODIUM 100 MG PO CAPS
100.0000 mg | ORAL_CAPSULE | Freq: Two times a day (BID) | ORAL | Status: DC
  Administered 2023-02-22 – 2023-02-23 (×2): 100 mg via ORAL
  Filled 2023-02-22 (×2): qty 1

## 2023-02-22 MED ORDER — ACETAMINOPHEN 10 MG/ML IV SOLN
INTRAVENOUS | Status: DC | PRN
Start: 2023-02-22 — End: 2023-02-22
  Administered 2023-02-22: 1000 mg via INTRAVENOUS

## 2023-02-22 MED ORDER — CHLORHEXIDINE GLUCONATE 0.12 % MT SOLN
15.0000 mL | Freq: Once | OROMUCOSAL | Status: AC
  Administered 2023-02-22: 15 mL via OROMUCOSAL

## 2023-02-22 MED ORDER — DEXAMETHASONE SODIUM PHOSPHATE 10 MG/ML IJ SOLN
INTRAMUSCULAR | Status: AC
  Filled 2023-02-22: qty 1

## 2023-02-22 MED ORDER — OXYCODONE HCL 5 MG PO TABS: 5.0000 mg | ORAL_TABLET | Freq: Once | ORAL | Status: DC | PRN

## 2023-02-22 MED ORDER — SODIUM CHLORIDE 0.9 % IR SOLN
Status: DC | PRN
  Administered 2023-02-22: 1000 mL

## 2023-02-22 MED ORDER — ONDANSETRON HCL 4 MG/2ML IJ SOLN: 4.0000 mg | Freq: Four times a day (QID) | INTRAMUSCULAR | Status: DC | PRN

## 2023-02-22 MED ORDER — PANTOPRAZOLE SODIUM 40 MG PO TBEC
40.0000 mg | DELAYED_RELEASE_TABLET | Freq: Every day | ORAL | Status: DC
  Administered 2023-02-23: 40 mg via ORAL
  Filled 2023-02-22: qty 1

## 2023-02-22 MED ORDER — PROPOFOL 500 MG/50ML IV EMUL
INTRAVENOUS | Status: AC
  Filled 2023-02-22: qty 50

## 2023-02-22 MED ORDER — ORAL CARE MOUTH RINSE: 15.0000 mL | OROMUCOSAL | Status: DC | PRN

## 2023-02-22 MED ORDER — LACTATED RINGERS IV SOLN: INTRAVENOUS | Status: DC

## 2023-02-22 MED ORDER — STERILE WATER FOR IRRIGATION IR SOLN
Status: DC | PRN
Start: 2023-02-22 — End: 2023-02-22
  Administered 2023-02-22: 2000 mL

## 2023-02-22 MED ORDER — FENTANYL CITRATE PF 50 MCG/ML IJ SOSY
50.0000 ug | PREFILLED_SYRINGE | INTRAMUSCULAR | Status: DC
  Administered 2023-02-22: 50 ug via INTRAVENOUS
  Filled 2023-02-22: qty 2

## 2023-02-22 MED ORDER — PROPOFOL 1000 MG/100ML IV EMUL
INTRAVENOUS | Status: AC
  Filled 2023-02-22: qty 100

## 2023-02-22 MED ORDER — ALLOPURINOL 100 MG PO TABS
100.0000 mg | ORAL_TABLET | Freq: Every day | ORAL | Status: DC
  Administered 2023-02-23: 100 mg via ORAL
  Filled 2023-02-22: qty 1

## 2023-02-22 MED ORDER — FENTANYL CITRATE PF 50 MCG/ML IJ SOSY: 25.0000 ug | PREFILLED_SYRINGE | INTRAMUSCULAR | Status: DC | PRN

## 2023-02-22 MED ORDER — PHENYLEPHRINE HCL-NACL 20-0.9 MG/250ML-% IV SOLN
INTRAVENOUS | Status: DC | PRN
Start: 2023-02-22 — End: 2023-02-22
  Administered 2023-02-22: 25 ug/min via INTRAVENOUS

## 2023-02-22 MED ORDER — ASPIRIN 81 MG PO CHEW
81.0000 mg | CHEWABLE_TABLET | Freq: Two times a day (BID) | ORAL | Status: DC
  Administered 2023-02-22 – 2023-02-23 (×2): 81 mg via ORAL
  Filled 2023-02-22 (×2): qty 1

## 2023-02-22 MED ORDER — MUPIROCIN 2 % EX OINT: 1.0000 | TOPICAL_OINTMENT | Freq: Once | CUTANEOUS | Status: DC

## 2023-02-22 MED ORDER — CEFAZOLIN SODIUM-DEXTROSE 2-4 GM/100ML-% IV SOLN
2.0000 g | INTRAVENOUS | Status: AC
  Administered 2023-02-22: 2 g via INTRAVENOUS
  Filled 2023-02-22: qty 100

## 2023-02-22 MED ORDER — CEFAZOLIN SODIUM-DEXTROSE 1-4 GM/50ML-% IV SOLN
1.0000 g | Freq: Four times a day (QID) | INTRAVENOUS | Status: AC
  Administered 2023-02-22 – 2023-02-23 (×2): 1 g via INTRAVENOUS
  Filled 2023-02-22 (×2): qty 50

## 2023-02-22 MED ORDER — ACETAMINOPHEN 10 MG/ML IV SOLN: 1000.0000 mg | Freq: Once | INTRAVENOUS | Status: DC | PRN

## 2023-02-22 MED ORDER — BUPIVACAINE-EPINEPHRINE 0.25% -1:200000 IJ SOLN
INTRAMUSCULAR | Status: AC
  Filled 2023-02-22: qty 1

## 2023-02-22 MED ORDER — METOCLOPRAMIDE HCL 5 MG PO TABS: 5.0000 mg | ORAL_TABLET | Freq: Three times a day (TID) | ORAL | Status: DC | PRN

## 2023-02-22 MED ORDER — ONDANSETRON HCL 4 MG PO TABS: 4.0000 mg | ORAL_TABLET | Freq: Four times a day (QID) | ORAL | Status: DC | PRN

## 2023-02-22 MED ORDER — ONDANSETRON HCL 4 MG/2ML IJ SOLN
INTRAMUSCULAR | Status: DC | PRN
  Administered 2023-02-22: 4 mg via INTRAVENOUS

## 2023-02-22 MED ORDER — MIDAZOLAM HCL 2 MG/2ML IJ SOLN: 1.0000 mg | INTRAMUSCULAR | Status: DC

## 2023-02-22 MED ORDER — MENTHOL 3 MG MT LOZG: 1.0000 | LOZENGE | OROMUCOSAL | Status: DC | PRN

## 2023-02-22 MED ORDER — ONDANSETRON HCL 4 MG/2ML IJ SOLN
INTRAMUSCULAR | Status: AC
  Filled 2023-02-22: qty 2

## 2023-02-22 MED ORDER — TRANEXAMIC ACID-NACL 1000-0.7 MG/100ML-% IV SOLN
1000.0000 mg | INTRAVENOUS | Status: AC
  Administered 2023-02-22: 1000 mg via INTRAVENOUS
  Filled 2023-02-22: qty 100

## 2023-02-22 MED ORDER — TIZANIDINE HCL 4 MG PO TABS
4.0000 mg | ORAL_TABLET | Freq: Four times a day (QID) | ORAL | Status: DC | PRN
  Administered 2023-02-22: 4 mg via ORAL
  Filled 2023-02-22: qty 1

## 2023-02-22 MED ORDER — ACETAMINOPHEN 160 MG/5ML PO SOLN: 1000.0000 mg | Freq: Once | ORAL | Status: DC | PRN

## 2023-02-22 MED ORDER — PROPOFOL 10 MG/ML IV BOLUS
INTRAVENOUS | Status: AC
  Filled 2023-02-22: qty 20

## 2023-02-22 MED ORDER — OXYCODONE HCL 5 MG/5ML PO SOLN: 5.0000 mg | Freq: Once | ORAL | Status: DC | PRN

## 2023-02-22 MED ORDER — ACETAMINOPHEN 500 MG PO TABS: 1000.0000 mg | ORAL_TABLET | Freq: Once | ORAL | Status: DC | PRN

## 2023-02-22 MED ORDER — BUPIVACAINE IN DEXTROSE 0.75-8.25 % IT SOLN
INTRATHECAL | Status: DC | PRN
  Administered 2023-02-22: 2 mL via INTRATHECAL

## 2023-02-22 MED ORDER — ALUM & MAG HYDROXIDE-SIMETH 200-200-20 MG/5ML PO SUSP: 30.0000 mL | ORAL | Status: DC | PRN

## 2023-02-22 MED ORDER — BUPIVACAINE-EPINEPHRINE 0.25% -1:200000 IJ SOLN
INTRAMUSCULAR | Status: DC | PRN
  Administered 2023-02-22: 30 mL

## 2023-02-22 MED ORDER — SODIUM CHLORIDE 0.9 % IV SOLN: INTRAVENOUS | Status: DC

## 2023-02-22 MED ORDER — OXYCODONE HCL 5 MG PO TABS
10.0000 mg | ORAL_TABLET | ORAL | Status: DC | PRN
  Administered 2023-02-22 – 2023-02-23 (×3): 15 mg via ORAL
  Filled 2023-02-22 (×3): qty 3

## 2023-02-22 MED ORDER — GABAPENTIN 100 MG PO CAPS
100.0000 mg | ORAL_CAPSULE | Freq: Three times a day (TID) | ORAL | Status: DC
  Administered 2023-02-22 – 2023-02-23 (×2): 100 mg via ORAL
  Filled 2023-02-22 (×2): qty 1

## 2023-02-22 MED ORDER — METOCLOPRAMIDE HCL 5 MG/ML IJ SOLN: 5.0000 mg | Freq: Three times a day (TID) | INTRAMUSCULAR | Status: DC | PRN

## 2023-02-22 MED ORDER — PROPOFOL 500 MG/50ML IV EMUL
INTRAVENOUS | Status: DC | PRN
  Administered 2023-02-22: 40 ug/kg/min via INTRAVENOUS

## 2023-02-22 MED ORDER — ACETAMINOPHEN 10 MG/ML IV SOLN
INTRAVENOUS | Status: AC
  Filled 2023-02-22: qty 100

## 2023-02-22 MED ORDER — ORAL CARE MOUTH RINSE: 15.0000 mL | Freq: Once | OROMUCOSAL | Status: AC

## 2023-02-22 MED ORDER — HYDROMORPHONE HCL 1 MG/ML IJ SOLN
0.5000 mg | INTRAMUSCULAR | Status: DC | PRN
  Administered 2023-02-22 – 2023-02-23 (×2): 1 mg via INTRAVENOUS
  Filled 2023-02-22 (×2): qty 1

## 2023-02-22 MED ORDER — DIPHENHYDRAMINE HCL 12.5 MG/5ML PO ELIX: 12.5000 mg | ORAL_SOLUTION | ORAL | Status: DC | PRN

## 2023-02-22 MED ORDER — MUPIROCIN 2 % EX OINT
TOPICAL_OINTMENT | CUTANEOUS | Status: AC
  Filled 2023-02-22: qty 22

## 2023-02-22 MED ORDER — 0.9 % SODIUM CHLORIDE (POUR BTL) OPTIME
TOPICAL | Status: DC | PRN
  Administered 2023-02-22: 1000 mL

## 2023-02-22 MED ORDER — POVIDONE-IODINE 10 % EX SWAB: 2.0000 | Freq: Once | CUTANEOUS | Status: DC

## 2023-02-22 MED ORDER — PHENOL 1.4 % MT LIQD: 1.0000 | OROMUCOSAL | Status: DC | PRN

## 2023-02-22 SURGICAL SUPPLY — 63 items
APL SKNCLS STERI-STRIP NONHPOA (GAUZE/BANDAGES/DRESSINGS)
BAG COUNTER SPONGE SURGICOUNT (BAG) IMPLANT
BAG SPEC THK2 15X12 ZIP CLS (MISCELLANEOUS) ×1
BAG SPNG CNTER NS LX DISP (BAG)
BAG ZIPLOCK 12X15 (MISCELLANEOUS) ×1 IMPLANT
BENZOIN TINCTURE PRP APPL 2/3 (GAUZE/BANDAGES/DRESSINGS) IMPLANT
BLADE SAG 18X100X1.27 (BLADE) ×1 IMPLANT
BLADE SURG SZ10 CARB STEEL (BLADE) ×2 IMPLANT
BNDG CMPR 6 X 5 YARDS HK CLSR (GAUZE/BANDAGES/DRESSINGS) ×2
BNDG ELASTIC 6INX 5YD STR LF (GAUZE/BANDAGES/DRESSINGS) ×2 IMPLANT
BOWL SMART MIX CTS (DISPOSABLE) IMPLANT
CEMENT BONE R 1X40 (Cement) IMPLANT
COMP FEM PS KNEE STD 10 LT (Knees) ×1 IMPLANT
COMP PATELLA 3 PEG 35 (Joint) ×1 IMPLANT
COMP TIB KNEE PS G 0 RT (Joint) ×1 IMPLANT
COMP TIB PS G 0D LT (Joint) ×1 IMPLANT
COMPONENT FEM PS KN STD 10 LT (Knees) IMPLANT
COMPONENT PATELLA 3 PEG 35 (Joint) IMPLANT
COMPONENT TIB KNEE PS G 0 RT (Joint) IMPLANT
COMPONET TIB PS G 0D LT (Joint) IMPLANT
COOLER ICEMAN CLASSIC (MISCELLANEOUS) ×1 IMPLANT
COVER SURGICAL LIGHT HANDLE (MISCELLANEOUS) ×1 IMPLANT
CUFF TOURN SGL QUICK 34 (TOURNIQUET CUFF) ×1
CUFF TRNQT CYL 34X4.125X (TOURNIQUET CUFF) ×1 IMPLANT
DRAPE INCISE IOBAN 66X45 STRL (DRAPES) ×1 IMPLANT
DRAPE U-SHAPE 47X51 STRL (DRAPES) ×1 IMPLANT
DURAPREP 26ML APPLICATOR (WOUND CARE) ×1 IMPLANT
ELECT BLADE TIP CTD 4 INCH (ELECTRODE) ×1 IMPLANT
ELECT REM PT RETURN 15FT ADLT (MISCELLANEOUS) ×1 IMPLANT
GAUZE PAD ABD 8X10 STRL (GAUZE/BANDAGES/DRESSINGS) ×2 IMPLANT
GAUZE SPONGE 4X4 12PLY STRL (GAUZE/BANDAGES/DRESSINGS) ×1 IMPLANT
GAUZE XEROFORM 1X8 LF (GAUZE/BANDAGES/DRESSINGS) IMPLANT
GLOVE BIO SURGEON STRL SZ7.5 (GLOVE) ×1 IMPLANT
GLOVE BIOGEL PI IND STRL 8 (GLOVE) ×2 IMPLANT
GLOVE ECLIPSE 8.0 STRL XLNG CF (GLOVE) ×1 IMPLANT
GOWN STRL REUS W/ TWL XL LVL3 (GOWN DISPOSABLE) ×2 IMPLANT
GOWN STRL REUS W/TWL XL LVL3 (GOWN DISPOSABLE) ×2
HANDPIECE INTERPULSE COAX TIP (DISPOSABLE) ×1
HOLDER FOLEY CATH W/STRAP (MISCELLANEOUS) IMPLANT
IMMOBILIZER KNEE 20 (SOFTGOODS) ×1
IMMOBILIZER KNEE 20 THIGH 36 (SOFTGOODS) ×1 IMPLANT
INSERT ARTISURF S8-11 18X22X14 (Insert) IMPLANT
KIT TURNOVER KIT A (KITS) IMPLANT
NS IRRIG 1000ML POUR BTL (IV SOLUTION) ×1 IMPLANT
PACK TOTAL KNEE CUSTOM (KITS) ×1 IMPLANT
PAD COLD SHLDR WRAP-ON (PAD) ×1 IMPLANT
PADDING CAST COTTON 6X4 STRL (CAST SUPPLIES) ×2 IMPLANT
PIN DRILL HDLS TROCAR 75 4PK (PIN) IMPLANT
PROTECTOR NERVE ULNAR (MISCELLANEOUS) ×1 IMPLANT
SCREW FEMALE HEX FIX 25X2.5 (ORTHOPEDIC DISPOSABLE SUPPLIES) IMPLANT
SET HNDPC FAN SPRY TIP SCT (DISPOSABLE) ×1 IMPLANT
SET PAD KNEE POSITIONER (MISCELLANEOUS) ×1 IMPLANT
SPIKE FLUID TRANSFER (MISCELLANEOUS) IMPLANT
STAPLER VISISTAT 35W (STAPLE) IMPLANT
STRIP CLOSURE SKIN 1/2X4 (GAUZE/BANDAGES/DRESSINGS) IMPLANT
SUT MNCRL AB 4-0 PS2 18 (SUTURE) IMPLANT
SUT VIC AB 0 CT1 27 (SUTURE) ×1
SUT VIC AB 0 CT1 27XBRD ANTBC (SUTURE) ×1 IMPLANT
SUT VIC AB 1 CT1 36 (SUTURE) ×2 IMPLANT
SUT VIC AB 2-0 CT1 27 (SUTURE) ×2
SUT VIC AB 2-0 CT1 TAPERPNT 27 (SUTURE) ×2 IMPLANT
TRAY FOLEY MTR SLVR 16FR STAT (SET/KITS/TRAYS/PACK) IMPLANT
WATER STERILE IRR 1000ML POUR (IV SOLUTION) ×2 IMPLANT

## 2023-02-22 NOTE — Anesthesia Procedure Notes (Signed)
Spinal  Start time: 02/22/2023 1:55 PM End time: 02/22/2023 1:59 PM Reason for block: surgical anesthesia Staffing Performed: resident/CRNA  Resident/CRNA: Elisabeth Cara, CRNA Performed by: Elisabeth Cara, CRNA Authorized by: Val Eagle, MD   Preanesthetic Checklist Completed: patient identified, IV checked, site marked, risks and benefits discussed, surgical consent, monitors and equipment checked, pre-op evaluation and timeout performed Spinal Block Patient position: sitting Prep: DuraPrep and site prepped and draped Patient monitoring: blood pressure, heart rate, cardiac monitor and continuous pulse ox Approach: midline Location: L3-4 Needle Needle type: Pencan  Needle gauge: 24 G Needle length: 10 cm Assessment Sensory level: T4 Events: CSF return Additional Notes Spinal kit expiration date checked and verified. Pt placed in sitting position. Sterile prep and drape. Site infiltrated with local anesthetic. One attempt by CRNA. + free flowing clear CSF obtained. - heme. Pt tolerated procedure well and placed supine

## 2023-02-22 NOTE — Op Note (Signed)
Operative Note  Date of operation: 02/22/2023 Preoperative diagnosis: Left knee primary osteoarthritis Postoperative diagnosis: Same  Procedure: Left press-fit total knee arthroplasty  Implants: Biomet/Zimmer persona press-fit knee system Implant Name Type Inv. Item Serial No. Manufacturer Lot No. LRB No. Used Action  COMP FEM PS KNEE STD 10 LT - [id number] Knees COMP FEM PS KNEE STD 10 LT  ZIMMER RECON(ORTH,TRAU,BIO,SG) [id number] Left 1 Implanted  COMP PATELLA 3 PEG 35 - [id number] Joint COMP PATELLA 3 PEG 35  ZIMMER RECON(ORTH,TRAU,BIO,SG) [id number] Left 1 Implanted  COMP TIB PS G 0D LT - [id number] Joint COMP TIB PS G 0D LT  ZIMMER RECON(ORTH,TRAU,BIO,SG)  Left 1 Implanted  INSERT ARTISURF S8-11 [id number] - [id number] Insert INSERT ARTISURF S8-11 [id number]  ZIMMER RECON(ORTH,TRAU,BIO,SG) [id number] Left 1 Implanted   Surgeon: Vanita Panda. Magnus Ivan, MD Assistant: Rexene Edison, PA-C  Anesthesia: #1 left lower extremity adductor canal block, #2 spinal, #3 local Tourniquet time: Under 2 hours EBL: Less than 100 cc Antibiotics: 2 g IV Ancef Complications: None  Indications: The patient is a 57 year old gentleman with well-documented severe and stage arthritis involving his left knee.  He has tried and failed all forms of conservative treatment of that knee.  His plain films do not show significant deformity but MRI showed areas of full-thickness cartilage loss of the weightbearing surface of the medial tibial plateau and medial femoral condyle as well as the trochlear groove.  There is also loose bodies in the knee and recurrent effusions.  He has tried and failed conservative treatment and at this point his knee pain is daily with that left knee.  It is detrimentally affecting his mobility, his quality of life and his actives day living and he does wish to proceed with a knee replacement and we have recommended this as well.  We did talk to him about the risk of acute blood loss anemia, nerve or  vessel injury, fracture, infection, DVT, implant failure and wound healing issues.  He understands her goals are hopefully decrease pain, improve mobility and improve quality of life.  Procedure description: After informed consent was obtained and the appropriate left knee was marked, an adductor canal block was obtained by anesthesia with the patient's left lower extremity in the holding room.  The patient was then brought to the operating room and set up on the operating table where spinal anesthesia was obtained.  He was then laid in supine position on the operating table and a Foley catheter was placed.  A nonsterile tractors placed around his upper left thigh and his left thigh, knee, leg and ankle were prepped and draped with DuraPrep and sterile drapes in good a sterile stockinette.  A timeout was called and he was identified as correct patient correct left knee.  An Esmarch was then used to wrap out the leg and the tourniquet was inflated to 300 mm pressure.  With the knee extended a midline incision was made over the patella and carried this proximally distally.  Dissection was carried down to the knee joint and a medial parapatellar arthrotomy was made finding a very large joint effusion and significant sediment in the knee from worn cartilage.  Right away could see there was severe cartilage loss of the patellofemoral joint.  When we flexed the knee there was significant cartilage loss in the medial compartment of the knee as well.  There was significant tearing of the medial and lateral meniscus.  Both of these were removed as well as remnants of the ACL.  We  then used extramedullary cutting guide for making her proximal tibia cut correction varus and valgus and a 7 degree slope.  We made this cut to take 2 mm off the low side.  We made the cut without difficulty.  We then went to the femur and used a intramedullary cutting guide for distal femur cut setting this for a left knee at 5 degrees externally  rotated and a 10 mm distal femoral cut.  We then brought the knee back down to full extension and remove more debris from the back of the knee and then placed a 10 mm extension block which was intact and gave [place] full extension.  We then went back to the femur and put a femoral sizing guide based off the epicondylar axis.  We chose a size 10 femur based off of this.  We made our anterior and posterior cuts followed our chamfer cuts based off a size 10.  We then back to the tibia and chose a size G left tibial tray setting the rotation of the tibial tubercle and the femur.  This covered the plateau well.  We did our drill punch and kill off of this and were pleased with the quality was bone for press-fit implants.  We then trialed our size G left tibia followed by our size 10 left femur.  We placed a 10 mm thickness left medial congruent polyethylene insert.  We are pleased with range of motion and stability of the sensor.  We then made a patella cut and drilled 3 holes for a size 35 patella button.  We then removed all transportation from the knee and irrigate the knee with normal saline solution using pulsatile lavage.  We then placed Marcaine with epinephrine around the arthrotomy.  With the knee in a flexed position we then placed our Biomet Zimmer tibial tray for a left knee size G followed by press fitting our size 10 left femur which was a CR standard fitting femur.  We placed her left 10 mm thickness medial congruent polyethylene insert and press-fit our size 35 patella button.  We are pleased with range of motion and stability of that knee following that.  We then put the knee through several cycles of motion.  The tourniquet was then let down and hemostasis was obtained electrocautery.  The arthrotomy was closed with interrupted #1 Vicryl suture followed by 0 Vicryl to close deep tissue and 2-0 Vicryl to close subcutaneous tissue.  The skin was closed with staples.  Well-padded sterile dressing was applied.   The patient was taken the recovery room in stable condition.  Rexene Edison, PA-C did assist during entire case and beginning to end and his assistance was crucial medically necessary for soft tissue management and retraction, helping guide implant placement and a layered closure of the wound.

## 2023-02-22 NOTE — Anesthesia Preprocedure Evaluation (Signed)
Anesthesia Evaluation  Patient identified by MRN, date of birth, ID band Patient awake    Reviewed: Allergy & Precautions, NPO status , Patient's Chart, lab work & pertinent test results  History of Anesthesia Complications Negative for: history of anesthetic complications  Airway Mallampati: II  TM Distance: >3 FB Neck ROM: Full    Dental  (+) Teeth Intact, Dental Advisory Given   Pulmonary neg pulmonary ROS, former smoker   breath sounds clear to auscultation       Cardiovascular negative cardio ROS  Rhythm:Regular     Neuro/Psych negative neurological ROS  negative psych ROS   GI/Hepatic negative GI ROS, Neg liver ROS,,,  Endo/Other  negative endocrine ROS    Renal/GU negative Renal ROS     Musculoskeletal  (+) Arthritis ,    Abdominal   Peds  Hematology negative hematology ROS (+) Lab Results      Component                Value               Date                      WBC                      6.2                 02/13/2023                HGB                      14.0                02/13/2023                HCT                      41.9                02/13/2023                MCV                      85.5                02/13/2023                PLT                      245                 02/13/2023              Anesthesia Other Findings   Reproductive/Obstetrics                             Anesthesia Physical Anesthesia Plan  ASA: 2  Anesthesia Plan: MAC, Regional and Spinal   Post-op Pain Management: Regional block*   Induction: Intravenous  PONV Risk Score and Plan: 1 and Propofol infusion and Treatment may vary due to age or medical condition  Airway Management Planned: Nasal Cannula, Natural Airway and Simple Face Mask  Additional Equipment: None  Intra-op Plan:   Post-operative Plan:   Informed Consent: I have reviewed the patients History and Physical, chart,  labs and discussed the procedure  including the risks, benefits and alternatives for the proposed anesthesia with the patient or authorized representative who has indicated his/her understanding and acceptance.     Dental advisory given  Plan Discussed with: CRNA  Anesthesia Plan Comments:        Anesthesia Quick Evaluation

## 2023-02-22 NOTE — Anesthesia Procedure Notes (Addendum)
Anesthesia Regional Block: Adductor canal block   Pre-Anesthetic Checklist: , timeout performed,  Correct Patient, Correct Site, Correct Laterality,  Correct Procedure, Correct Position, site marked,  Risks and benefits discussed,  Surgical consent,  Pre-op evaluation,  At surgeon's request and post-op pain management  Laterality: Left and Lower  Prep: chloraprep       Needles:  Injection technique: Single-shot      Needle Length: 9cm  Needle Gauge: 22     Additional Needles: Arrow StimuQuik ECHO Echogenic Stimulating PNB Needle  Procedures:,,,, ultrasound used (permanent image in chart),,    Narrative:  Start time: 02/22/2023 1:30 PM End time: 02/22/2023 1:35 PM Injection made incrementally with aspirations every 5 mL.  Performed by: Personally  Anesthesiologist: Val Eagle, MD

## 2023-02-22 NOTE — OR Nursing (Signed)
Pt wife was updated on procedural status

## 2023-02-22 NOTE — Interval H&P Note (Signed)
History and Physical Interval Note: The patient understands that he is here today for a left total knee replacement to treat his significant left knee arthritis.  There has been no acute or interval change in his medical status.  The risks and benefits of surgery have been discussed in detail and informed consent has been obtained.  The left operative knee has been marked.  02/22/2023 12:29 PM  Randy Wilson  has presented today for surgery, with the diagnosis of osteoarthritis left knee.  The various methods of treatment have been discussed with the patient and family. After consideration of risks, benefits and other options for treatment, the patient has consented to  Procedure(s): LEFT TOTAL KNEE ARTHROPLASTY (Left) as a surgical intervention.  The patient's history has been reviewed, patient examined, no change in status, stable for surgery.  I have reviewed the patient's chart and labs.  Questions were answered to the patient's satisfaction.     [name]

## 2023-02-22 NOTE — Anesthesia Procedure Notes (Signed)
Procedure Name: MAC Date/Time: 02/22/2023 1:53 PM  Performed by: Elisabeth Cara, CRNAPre-anesthesia Checklist: Patient identified, Emergency Drugs available, Suction available, Timeout performed and Patient being monitored Patient Re-evaluated:Patient Re-evaluated prior to induction Oxygen Delivery Method: Simple face mask Placement Confirmation: positive ETCO2 Dental Injury: Teeth and Oropharynx as per pre-operative assessment

## 2023-02-22 NOTE — Plan of Care (Signed)
  Problem: Pain Managment: Goal: General experience of comfort will improve Outcome: Progressing   Problem: Safety: Goal: Ability to remain free from injury will improve Outcome: Progressing   Problem: Coping: Goal: Level of anxiety will decrease Outcome: Progressing   

## 2023-02-22 NOTE — Transfer of Care (Signed)
Immediate Anesthesia Transfer of Care Note  Patient: Randy Wilson  Procedure(s) Performed: Procedure(s): LEFT TOTAL KNEE ARTHROPLASTY (Left)  Patient Location: PACU  Anesthesia Type:Spinal  Level of Consciousness:  sedated, patient cooperative and responds to stimulation  Airway & Oxygen Therapy:Patient Spontanous Breathing and Patient connected to face mask oxgen  Post-op Assessment:  Report given to PACU RN and Post -op Vital signs reviewed and stable  Post vital signs:  Reviewed and stable  Last Vitals:  Vitals:   02/22/23 1131 02/22/23 1330  BP: (!) 143/84 (!) 137/90  Pulse: (!) 59 (!) 58  Resp: 20 16  Temp: (!) 36.4 C   SpO2: 96% 97%    Complications: No apparent anesthesia complications

## 2023-02-23 ENCOUNTER — Encounter (HOSPITAL_COMMUNITY): Payer: Self-pay | Admitting: Orthopaedic Surgery

## 2023-02-23 DIAGNOSIS — M1712 Unilateral primary osteoarthritis, left knee: Secondary | ICD-10-CM | POA: Diagnosis not present

## 2023-02-23 LAB — CBC
HCT: 40.5 % (ref 39.0–52.0)
Hemoglobin: 13.4 g/dL (ref 13.0–17.0)
MCH: 28.7 pg (ref 26.0–34.0)
MCHC: 33.1 g/dL (ref 30.0–36.0)
MCV: 86.7 fL (ref 80.0–100.0)
Platelets: 267 10*3/uL (ref 150–400)
RBC: 4.67 MIL/uL (ref 4.22–5.81)
RDW: 12.5 % (ref 11.5–15.5)
WBC: 13.4 10*3/uL — ABNORMAL HIGH (ref 4.0–10.5)
nRBC: 0 % (ref 0.0–0.2)

## 2023-02-23 LAB — BASIC METABOLIC PANEL
Anion gap: 10 (ref 5–15)
BUN: 12 mg/dL (ref 6–20)
CO2: 23 mmol/L (ref 22–32)
Calcium: 8.8 mg/dL — ABNORMAL LOW (ref 8.9–10.3)
Chloride: 101 mmol/L (ref 98–111)
Creatinine, Ser: 0.72 mg/dL (ref 0.61–1.24)
GFR, Estimated: >60 mL/min (ref >60)
Glucose, Bld: 137 mg/dL — ABNORMAL HIGH (ref 70–99)
Potassium: 4.4 mmol/L (ref 3.5–5.1)
Sodium: 134 mmol/L — ABNORMAL LOW (ref 135–145)

## 2023-02-23 MED ORDER — OXYCODONE HCL 5 MG PO TABS: 5.0000 mg | ORAL_TABLET | Freq: Four times a day (QID) | ORAL | 0 refills | Status: DC | PRN

## 2023-02-23 MED ORDER — ASPIRIN 81 MG PO CHEW: 81.0000 mg | CHEWABLE_TABLET | Freq: Two times a day (BID) | ORAL | 0 refills | Status: DC

## 2023-02-23 MED ORDER — TIZANIDINE HCL 4 MG PO TABS: 4.0000 mg | ORAL_TABLET | Freq: Four times a day (QID) | ORAL | 1 refills | Status: DC | PRN

## 2023-02-23 NOTE — Discharge Summary (Signed)
Patient ID: Randy Wilson MRN: [id number] DOB/AGE: 11/11/1965 57 y.o.  Admit date: 02/22/2023 Discharge date: 02/23/2023  Admission Diagnoses:  Principal Problem:   Unilateral primary osteoarthritis, left knee Active Problems:   Status post total left knee replacement   Discharge Diagnoses:  Same  Past Medical History:  Diagnosis Date   Crohn's disease of small intestine (HCC) 2017   followed by dr h. danis;  s/p colon resection 11-02-2015,  on humira   History of asthma    child   History of colon polyps    History of COVID-19 05/30/2020   positive result in epicrunny nose/cough x 3 weeks all symptoms resolved   History of gout    last flare up 1 year ago per pt on 04-03-2021   OA (osteoarthritis)    Right wrist fracture    fell off ladder per pt on 02-08-2021   Right wrist fracture, closed, initial encounter 01/22/2021    Surgeries: Procedure(s): LEFT TOTAL KNEE ARTHROPLASTY on 02/22/2023   Consultants:   Discharged Condition: Improved  Hospital Course: Randy Wilson is an 57 y.o. male who was admitted 02/22/2023 for operative treatment ofUnilateral primary osteoarthritis, left knee. Patient has severe unremitting pain that affects sleep, daily activities, and work/hobbies. After pre-op clearance the patient was taken to the operating room on 02/22/2023 and underwent  Procedure(s): LEFT TOTAL KNEE ARTHROPLASTY.    Patient was given perioperative antibiotics:  Anti-infectives (From admission, onward)    Start     Dose/Rate Route Frequency Ordered Stop   02/22/23 2000  ceFAZolin (ANCEF) IVPB 1 g/50 mL premix        1 g 100 mL/hr over 30 Minutes Intravenous Every 6 hours 02/22/23 1748 02/23/23 0155   02/22/23 1115  ceFAZolin (ANCEF) IVPB 2g/100 mL premix        2 g 200 mL/hr over 30 Minutes Intravenous On call to O.R. 02/22/23 1108 02/22/23 1430        Patient was given sequential compression devices, early ambulation, and chemoprophylaxis to prevent DVT.  Patient  benefited maximally from hospital stay and there were no complications.    Recent vital signs: Patient Vitals for the past 24 hrs:  BP Temp Temp src Pulse Resp SpO2  02/23/23 0554 130/77 98.1 F (36.7 C) -- 71 16 95 %  02/23/23 0125 (!) 142/77 97.9 F (36.6 C) Oral 72 16 95 %  02/22/23 2146 131/85 97.8 F (36.6 C) -- 66 17 96 %  02/22/23 1754 116/77 98 F (36.7 C) Oral (!) 55 15 96 %  02/22/23 1740 115/79 97.6 F (36.4 C) -- (!) 55 12 96 %  02/22/23 1730 127/67 -- -- (!) 56 20 96 %  02/22/23 1715 123/71 -- -- (!) 55 18 96 %  02/22/23 1700 118/76 -- -- 63 17 96 %  02/22/23 1645 118/74 -- -- (!) 58 18 96 %  02/22/23 1630 119/64 -- -- (!) 58 12 100 %  02/22/23 1624 114/74 97.6 F (36.4 C) -- 62 13 99 %  02/22/23 1330 (!) 137/90 -- -- (!) 58 16 97 %  02/22/23 1131 (!) 143/84 (!) 97.5 F (36.4 C) Oral (!) 59 20 96 %  02/22/23 1115 (!) 143/84 -- -- (!) 59 -- 96 %     Recent laboratory studies:  Recent Labs    02/23/23 0311  WBC 13.4*  HGB 13.4  HCT 40.5  PLT 267  NA 134*  K 4.4  CL 101  CO2 23  BUN 12  CREATININE 0.72  GLUCOSE 137*  CALCIUM 8.8*     Discharge Medications:   Allergies as of 02/23/2023       Reactions   Tramadol Itching   Shrimp [shellfish Allergy] Hives        Medication List     TAKE these medications    allopurinol 100 MG tablet Commonly known as: ZYLOPRIM TAKE 1 TABLET BY MOUTH EVERY DAY   aspirin 81 MG chewable tablet Chew 1 tablet (81 mg total) by mouth 2 (two) times daily.   cetirizine 10 MG tablet Commonly known as: ZYRTEC Take 1 tablet (10 mg total) by mouth daily. What changed:  when to take this reasons to take this   EPINEPHrine 0.3 mg/0.3 mL Soaj injection Commonly known as: EPI-PEN Inject 0.3 mg into the muscle as needed for anaphylaxis.   oxyCODONE 5 MG immediate release tablet Commonly known as: Oxy IR/ROXICODONE Take 1-2 tablets (5-10 mg total) by mouth every 6 (six) hours as needed for moderate pain (pain score  4-6).   Skyrizi 180 MG/1.2ML Soct Generic drug: Risankizumab-rzaa Inject 1.2 mLs into the skin every 8 (eight) weeks. BEGIN AT WEEK 12   tetrahydrozoline-zinc 0.05-0.25 % ophthalmic solution Commonly known as: VISINE-AC Place 2 drops into both eyes 3 (three) times daily as needed (irritated eyes).   tiZANidine 4 MG tablet Commonly known as: ZANAFLEX Take 1 tablet (4 mg total) by mouth every 6 (six) hours as needed for muscle spasms. What changed: when to take this               Durable Medical Equipment  (From admission, onward)           Start     Ordered   02/22/23 1749  DME 3 n 1  Once        02/22/23 1748   02/22/23 1749  DME Walker rolling  Once       Question Answer Comment  Walker: With 5 Inch Wheels   Patient needs a walker to treat with the following condition Status post total left knee replacement      02/22/23 1748            Diagnostic Studies: DG Knee Left Port  Result Date: 02/22/2023 CLINICAL DATA:  Postop left knee arthroplasty. EXAM: PORTABLE LEFT KNEE - 1-2 VIEW COMPARISON:  None Available. FINDINGS: Left knee arthroplasty in expected alignment. No periprosthetic lucency or fracture. Recent postsurgical change includes air and edema in the soft tissues and joint space. Anterior skin staples. Sclerotic lesion in the distal femoral shaft is partially included in the field of view, likely bone infarct or enchondroma IMPRESSION: Left knee arthroplasty without immediate postoperative complication. Electronically Signed   By: Narda [name] M.D.   On: 02/22/2023 17:27    Disposition: Discharge disposition: 01-Home or Self Care          Follow-up Information     [name], MD Follow up in 2 week(s).   Specialty: Orthopedic Surgery Contact information: [address] [place] [address] [phone]                  Signed: [name] 02/23/2023, 10:07 AM

## 2023-02-23 NOTE — TOC CAGE-AID Note (Signed)
Transition of Care [facility]) - CAGE-AID Screening   Patient Details  Name: LYNARD POSTLEWAIT MRN: [id number] Date of Birth: [birth year]  Transition of Care [facility]) CM/SW Contact:    Carmina Miller, LCSWA Phone Number: 02/23/2023, 10:03 AM   Clinical Narrative:  CSW spoke with Selena Batten at [employer], order placed for 3 in 1 and RW, will be delivered to the room within two hours.   CAGE-AID Screening:

## 2023-02-23 NOTE — Evaluation (Signed)
Physical Therapy Evaluation Patient Details Name: Randy Wilson MRN: [id number] DOB: 1965/08/03 Today's Date: 02/23/2023  History of Present Illness  Pt is a 57 year old male s/p left TKA on 02/22/23.  Clinical Impression  Pt is s/p TKA resulting in the deficits listed below (see PT Problem List).  Pt will benefit from acute skilled PT to increase their independence and safety with mobility to allow discharge. Pt ambulated in hallway and performed LE exercises. Pt eager to progress well and return home with spouse.  Will return for afternoon session and anticipate pt to d/c home later today.          If plan is discharge home, recommend the following:     Can travel by private vehicle        Equipment Recommendations Rolling walker (2 wheels)  Recommendations for Other Services       Functional Status Assessment Patient has had a recent decline in their functional status and demonstrates the ability to make significant improvements in function in a reasonable and predictable amount of time.     Precautions / Restrictions Precautions Precautions: Fall;Knee Required Braces or Orthoses: Knee Immobilizer - Left Restrictions Weight Bearing Restrictions: No LLE Weight Bearing: Weight bearing as tolerated      Mobility  Bed Mobility Overal bed mobility: Needs Assistance Bed Mobility: Supine to Sit     Supine to sit: Contact guard, HOB elevated     General bed mobility comments: cues for self assist    Transfers Overall transfer level: Needs assistance Equipment used: Rolling walker (2 wheels) Transfers: Sit to/from Stand Sit to Stand: Contact guard assist           General transfer comment: verbal cues for UE and LE positioning    Ambulation/Gait Ambulation/Gait assistance: Contact guard assist Gait Distance (Feet): 120 Feet Assistive device: Rolling walker (2 wheels) Gait Pattern/deviations: Step-to pattern, Decreased stance time - left, Antalgic Gait  velocity: decr     General Gait Details: verbal cues for sequence, RW positioning, step length, posture  Stairs            Wheelchair Mobility     Tilt Bed    Modified Rankin (Stroke Patients Only)       Balance                                             Pertinent Vitals/Pain Pain Assessment Pain Assessment: 0-10 Pain Score: 7  Pain Location: left knee Pain Descriptors / Indicators: Sore, Aching Pain Intervention(s): Monitored during session, Repositioned, Premedicated before session, Ice applied    Home Living Family/patient expects to be discharged to:: Private residence Living Arrangements: Spouse/significant other   Type of Home: House Home Access: Stairs to enter   Secretary/administrator of Steps: 1   Home Layout: Able to live on main level with bedroom/bathroom Home Equipment: None      Prior Function Prior Level of Function : Independent/Modified Independent                     Extremity/Trunk Assessment        Lower Extremity Assessment Lower Extremity Assessment: LLE deficits/detail LLE Deficits / Details: unable to perform SLR, ankle to perform ankle pumps, Lt knee AAROM approx 4-35* limited by pain       Communication   Communication Communication: No apparent difficulties  Cognition Arousal: Alert Behavior During Therapy: WFL for tasks assessed/performed Overall Cognitive Status: Within Functional Limits for tasks assessed                                          General Comments      Exercises Total Joint Exercises Ankle Circles/Pumps: AROM, 10 reps, Both Quad Sets: AROM, Both, 10 reps Short Arc Quad: AAROM, Left, 10 reps Heel Slides: AAROM, Left, 10 reps Hip ABduction/ADduction: AAROM, Left, 10 reps   Assessment/Plan    PT Assessment Patient needs continued PT services  PT Problem List Decreased mobility;Decreased strength;Decreased range of motion;Pain;Decreased knowledge of  use of DME       PT Treatment Interventions Functional mobility training;Stair training;Gait training;DME instruction;Balance training;Therapeutic activities;Therapeutic exercise;Patient/family education    PT Goals (Current goals can be found in the Care Plan section)  Acute Rehab PT Goals PT Goal Formulation: With patient Time For Goal Achievement: 03/02/23 Potential to Achieve Goals: Good    Frequency 7X/week     Co-evaluation               AM-PAC PT "6 Clicks" Mobility  Outcome Measure Help needed turning from your back to your side while in a flat bed without using bedrails?: None Help needed moving from lying on your back to sitting on the side of a flat bed without using bedrails?: A Little Help needed moving to and from a bed to a chair (including a wheelchair)?: A Little Help needed standing up from a chair using your arms (e.g., wheelchair or bedside chair)?: A Little Help needed to walk in hospital room?: A Little Help needed climbing 3-5 steps with a railing? : A Little 6 Click Score: 19    End of Session Equipment Utilized During Treatment: Gait belt Activity Tolerance: Patient tolerated treatment well Patient left: in chair;with call bell/phone within reach;with chair alarm set;with family/visitor present Nurse Communication: Mobility status PT Visit Diagnosis: Difficulty in walking, not elsewhere classified (R26.2)    Time: 1610-9604 PT Time Calculation (min) (ACUTE ONLY): 20 min   Charges:   PT Evaluation $PT Eval Low Complexity: 1 Low   PT General Charges $$ ACUTE PT VISIT: 1 Visit        Thomasene Mohair PT, DPT Physical Therapist Acute Rehabilitation Services Office: [phone]   Kati L Payson 02/23/2023, 11:30 AM

## 2023-02-23 NOTE — Progress Notes (Signed)
Physical Therapy Treatment Patient Details Name: Randy Wilson MRN: [id number] DOB: [birth year] Today's Date: 02/23/2023   History of Present Illness Pt is a 57 year old male s/p left TKA on 02/22/23.    PT Comments  Pt ambulated in hallway again and practiced one step. Provided HEP handout and verbally reviewed.  Pt reports feeling ready to d/c home today.      If plan is discharge home, recommend the following:     Can travel by private vehicle        Equipment Recommendations  Rolling walker (2 wheels)    Recommendations for Other Services       Precautions / Restrictions Precautions Precautions: Fall;Knee Restrictions LLE Weight Bearing: Weight bearing as tolerated     Mobility  Bed Mobility Overal bed mobility: Needs Assistance Bed Mobility: Supine to Sit     Supine to sit: Contact guard, HOB elevated     General bed mobility comments: pt in recliner    Transfers Overall transfer level: Needs assistance Equipment used: Rolling walker (2 wheels) Transfers: Sit to/from Stand Sit to Stand: Supervision           General transfer comment: verbal cues for UE and LE positioning    Ambulation/Gait Ambulation/Gait assistance: Contact guard assist, Supervision Gait Distance (Feet): 120 Feet Assistive device: Rolling walker (2 wheels) Gait Pattern/deviations: Step-to pattern, Decreased stance time - left, Antalgic Gait velocity: decr     General Gait Details: verbal cues for sequence, RW positioning, step length, posture   Stairs Stairs: Yes Stairs assistance: Contact guard assist Stair Management: Step to pattern, Forwards, With walker Number of Stairs: 1 General stair comments: verbal cues for safety and sequence; performed twice; pt reports understanding   Wheelchair Mobility     Tilt Bed    Modified Rankin (Stroke Patients Only)       Balance                                            Cognition Arousal: Alert Behavior  During Therapy: WFL for tasks assessed/performed Overall Cognitive Status: Within Functional Limits for tasks assessed                                          Exercises     General Comments        Pertinent Vitals/Pain Pain Assessment Pain Assessment: 0-10 Pain Score: 6  Pain Location: left knee Pain Descriptors / Indicators: Sore, Aching Pain Intervention(s): Repositioned, Premedicated before session, Monitored during session    Home Living                          Prior Function            PT Goals (current goals can now be found in the care plan section) Progress towards PT goals: Progressing toward goals    Frequency    7X/week      PT Plan      Co-evaluation              AM-PAC PT "6 Clicks" Mobility   Outcome Measure  Help needed turning from your back to your side while in a flat bed without using bedrails?: None Help needed moving from lying on  your back to sitting on the side of a flat bed without using bedrails?: A Little Help needed moving to and from a bed to a chair (including a wheelchair)?: A Little Help needed standing up from a chair using your arms (e.g., wheelchair or bedside chair)?: A Little Help needed to walk in hospital room?: A Little Help needed climbing 3-5 steps with a railing? : A Little 6 Click Score: 19    End of Session Equipment Utilized During Treatment: Gait belt Activity Tolerance: Patient tolerated treatment well Patient left: in chair;with call bell/phone within reach;with chair alarm set;with family/visitor present Nurse Communication: Mobility status PT Visit Diagnosis: Difficulty in walking, not elsewhere classified (R26.2)     Time: 1319-1330 PT Time Calculation (min) (ACUTE ONLY): 11 min  Charges:    $Gait Training: 8-22 mins PT General Charges $$ ACUTE PT VISIT: 1 Visit                    [name], DPT Physical Therapist Acute Rehabilitation Services Office:  [phone]    [name] 02/23/2023, 4:18 PM

## 2023-02-23 NOTE — Discharge Instructions (Signed)

## 2023-02-23 NOTE — Plan of Care (Signed)
  Problem: Education: Goal: Knowledge of the prescribed therapeutic regimen will improve Outcome: Progressing   Problem: Pain Management: Goal: Pain level will decrease with appropriate interventions Outcome: Progressing   Problem: Activity: Goal: Ability to avoid complications of mobility impairment will improve Outcome: Progressing   

## 2023-02-23 NOTE — Progress Notes (Signed)
Subjective: 1 Day Post-Op Procedure(s) (LRB): LEFT TOTAL KNEE ARTHROPLASTY (Left) Patient reports pain as moderate.    Objective: Vital signs in last 24 hours: Temp:  [97.5 F (36.4 C)-98.1 F (36.7 C)] 98.1 F (36.7 C) (09/28 0554) Pulse Rate:  [55-72] 71 (09/28 0554) Resp:  [12-20] 16 (09/28 0554) BP: (114-143)/(64-90) 130/77 (09/28 0554) SpO2:  [95 %-100 %] 95 % (09/28 0554)  Intake/Output from previous day: 09/27 0701 - 09/28 0700 In: 2546.8 [P.O.:480; I.V.:1866.8; IV Piggyback:200] Out: 3275 [Urine:3225; Blood:50] Intake/Output this shift: No intake/output data recorded.  Recent Labs    02/23/23 0311  HGB 13.4   Recent Labs    02/23/23 0311  WBC 13.4*  RBC 4.67  HCT 40.5  PLT 267   Recent Labs    02/23/23 0311  NA 134*  K 4.4  CL 101  CO2 23  BUN 12  CREATININE 0.72  GLUCOSE 137*  CALCIUM 8.8*   No results for input(s): "LABPT", "INR" in the last 72 hours.  Sensation intact distally Intact pulses distally Dorsiflexion/Plantar flexion intact Incision: scant drainage Compartment soft   Assessment/Plan: 1 Day Post-Op Procedure(s) (LRB): LEFT TOTAL KNEE ARTHROPLASTY (Left) Up with therapy Discharge home with home health      [name] 02/23/2023, 10:06 AM

## 2023-02-23 NOTE — Anesthesia Postprocedure Evaluation (Signed)
Anesthesia Post Note  Patient: Randy Wilson  Procedure(s) Performed: LEFT TOTAL KNEE ARTHROPLASTY (Left: Knee)     Patient location during evaluation: PACU Anesthesia Type: Regional, MAC and Spinal Level of consciousness: awake and alert Pain management: pain level controlled Vital Signs Assessment: post-procedure vital signs reviewed and stable Respiratory status: spontaneous breathing, nonlabored ventilation and respiratory function stable Cardiovascular status: stable and blood pressure returned to baseline Postop Assessment: no apparent nausea or vomiting Anesthetic complications: no   No notable events documented.  Last Vitals:  Vitals:   02/23/23 0554 02/23/23 1046  BP: 130/77 (!) 147/86  Pulse: 71 63  Resp: 16 14  Temp: 36.7 C 36.6 C  SpO2: 95% 95%    Last Pain:  Vitals:   02/23/23 1248  TempSrc:   PainSc: 3                  Amma Crear

## 2023-03-07 ENCOUNTER — Telehealth: Payer: Self-pay | Admitting: Orthopaedic Surgery

## 2023-03-07 ENCOUNTER — Ambulatory Visit: Payer: BC Managed Care – PPO | Admitting: Orthopaedic Surgery

## 2023-03-07 ENCOUNTER — Encounter: Payer: Self-pay | Admitting: Orthopaedic Surgery

## 2023-03-07 ENCOUNTER — Other Ambulatory Visit: Payer: Self-pay | Admitting: Radiology

## 2023-03-07 DIAGNOSIS — Z96652 Presence of left artificial knee joint: Secondary | ICD-10-CM

## 2023-03-07 MED ORDER — OXYCODONE HCL 5 MG PO TABS: 5.0000 mg | ORAL_TABLET | Freq: Four times a day (QID) | ORAL | 0 refills | Status: DC | PRN

## 2023-03-07 NOTE — Progress Notes (Signed)
The patient is here for his first postoperative visit status post a left total knee replacement.  He is only 57 years old.  He is ambulate with a cane.  He has been compliant with his baby aspirin twice a day.  He is only taking oxycodone for therapy and bending purposes.  He said they have only been able to flex his knee to getting close to 80 degrees.  On my exam today his extension is getting close to full and his flexion is mainly to about 75 degrees.  His staple line looks good so removed the staples and place Steri-Strips.  His calf is soft.  I will refill his oxycodone.  We will work on setting him up for outpatient therapy to work on the flexion extension of his left knee but he knows to push harder through therapy and on a daily basis on his own multiple times a day.  We will see him back in 4 weeks to see how he is doing overall from a range of motion standpoint.

## 2023-03-07 NOTE — Telephone Encounter (Signed)
Have not received anything yet for auth

## 2023-03-07 NOTE — Telephone Encounter (Signed)
Patient called and said that the pharmacy needs authorization to refill his medication. CB#[phone]

## 2023-03-08 ENCOUNTER — Ambulatory Visit: Payer: BC Managed Care – PPO | Admitting: Physical Therapy

## 2023-03-09 ENCOUNTER — Telehealth: Payer: Self-pay | Admitting: Orthopedic Surgery

## 2023-03-09 MED ORDER — OXYCODONE HCL 5 MG PO TABS
5.0000 mg | ORAL_TABLET | ORAL | 0 refills | Status: AC | PRN
Start: 2023-03-09 — End: 2023-03-14

## 2023-03-09 NOTE — Telephone Encounter (Signed)
Orthopedic Telephone Call  Patient had TKA with Dr. Magnus Ivan. At his last office visit this past week, more oxycodone was sent in for him. He states he called at the end of last week because there needed to be prior authorization for this medication. I can see notes in the chart that he did in fact make this phone call on 10/10. He says that still has not been done. I told him I am not sure how to go about getting that done especially on a weekend when the insurance companies are closed. I told him I can try to send it to a different pharmacy so his medication was sen to the [facility] on [address].   London Sheer, MD Orthopedic Surgeon

## 2023-03-11 ENCOUNTER — Telehealth: Payer: Self-pay | Admitting: Orthopaedic Surgery

## 2023-03-11 NOTE — Telephone Encounter (Signed)
Patient called. He needs a note stating that he had surgery and how long he will be out of work. His cb# [phone]

## 2023-03-12 NOTE — Telephone Encounter (Signed)
Patient aware this note was written for him

## 2023-03-13 ENCOUNTER — Other Ambulatory Visit: Payer: Self-pay

## 2023-03-13 ENCOUNTER — Ambulatory Visit: Payer: BC Managed Care – PPO | Admitting: Physical Therapy

## 2023-03-13 ENCOUNTER — Encounter: Payer: Self-pay | Admitting: Physical Therapy

## 2023-03-13 DIAGNOSIS — R600 Localized edema: Secondary | ICD-10-CM

## 2023-03-13 DIAGNOSIS — R2689 Other abnormalities of gait and mobility: Secondary | ICD-10-CM | POA: Diagnosis not present

## 2023-03-13 DIAGNOSIS — M25562 Pain in left knee: Secondary | ICD-10-CM | POA: Diagnosis not present

## 2023-03-13 DIAGNOSIS — M25662 Stiffness of left knee, not elsewhere classified: Secondary | ICD-10-CM

## 2023-03-13 DIAGNOSIS — M6281 Muscle weakness (generalized): Secondary | ICD-10-CM

## 2023-03-13 DIAGNOSIS — R2681 Unsteadiness on feet: Secondary | ICD-10-CM

## 2023-03-13 NOTE — Therapy (Signed)
OUTPATIENT PHYSICAL THERAPY EVALUATION   Patient Name: Randy Wilson MRN: [id number] DOB:1966/03/10, 57 y.o., male Today's Date: 03/13/2023  END OF SESSION:  PT End of Session - 03/13/23 1014     Visit Number 1    Number of Visits 20    Date for PT Re-Evaluation 05/08/23    Authorization Type [employer] $52 copay    PT Start Time 0927    PT Stop Time 1000    PT Time Calculation (min) 33 min    Activity Tolerance Patient tolerated treatment well    Behavior During Therapy [facility] for tasks assessed/performed             Past Medical History:  Diagnosis Date   Crohn's disease of small intestine (HCC) 2017   followed by dr h. danis;  s/p colon resection 11-02-2015,  on humira   History of asthma    child   History of colon polyps    History of COVID-19 05/30/2020   positive result in epicrunny nose/cough x 3 weeks all symptoms resolved   History of gout    last flare up 1 year ago per pt on 04-03-2021   OA (osteoarthritis)    Right wrist fracture    fell off ladder per pt on 02-08-2021   Right wrist fracture, closed, initial encounter 01/22/2021   Past Surgical History:  Procedure Laterality Date   ANKLE SURGERY Left 2002   bone spurs removed   COLONOSCOPY     last one 02-25-2020  by dr h. danis   HARDWARE REMOVAL Right 04/06/2021   Procedure: Right wrist removal of hardware;  Surgeon: [name], MD;  Location: [facility];  Service: Orthopedics;  Laterality: Right;  with local anesthesia Needs 30 minutes   LAPAROSCOPIC ILEOCECECTOMY N/A 11/02/2015   Procedure: LAPAROSCOPIC ILEOCECECTOMY;  Surgeon: [name], MD;  Location: [facility];  Service: General;  Laterality: N/A;   ORIF WRIST FRACTURE Right 02/09/2021   Procedure: OPEN REDUCTION INTERNAL FIXATION (ORIF) WRIST FRACTURE;  Surgeon: [name], MD;  Location: [facility] [place];  Service: Orthopedics;  Laterality: Right;  WITH REGIONAL   ROTATOR CUFF REPAIR Bilateral    SHOULDER ARTHROSCOPY  W/ SUBACROMIAL DECOMPRESSION AND DISTAL CLAVICLE EXCISION Left 05/07/2008   @[facility]  by dr [name];   bone spurs removed   TOTAL KNEE ARTHROPLASTY Left 02/22/2023   Procedure: LEFT TOTAL KNEE ARTHROPLASTY;  Surgeon: [name], MD;  Location: [facility];  Service: Orthopedics;  Laterality: Left;   Patient Active Problem List   Diagnosis Date Noted   Status post total left knee replacement 02/22/2023   Hyperpigmented skin lesion 10/09/2022   Acute pain of left knee 08/01/2022   Dyslipidemia 03/08/2022   Crohn's disease of small intestine (HCC) 03/01/2022   Residual hemorrhoidal skin tags 03/31/2020   Prediabetes 01/20/2020   Erectile dysfunction 01/20/2020   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   Asthma 04/24/2015   Hx of colonic polyp 11/08/2014   History of colonic polyps 09/17/2014    PCP: Georgina Quint, MD  REFERRING PROVIDER: [name]*  REFERRING DIAG: W09.811 (ICD-10-CM) - Status post total left knee replacement  Rationale for Evaluation and Treatment: Rehabilitation  THERAPY DIAG:  Acute pain of left knee - Plan: PT plan of care cert/re-cert  Stiffness of left knee, not elsewhere classified - Plan: PT plan of care cert/re-cert  Muscle weakness (generalized) - Plan: PT plan of care cert/re-cert  Other abnormalities of gait and mobility - Plan: PT  plan of care cert/re-cert  Unsteadiness on feet - Plan: PT plan of care cert/re-cert  Localized edema - Plan: PT plan of care cert/re-cert  ONSET DATE: DOS: 02/22/23   SUBJECTIVE:                                                                                                                                                                                           SUBJECTIVE STATEMENT: Pt is s/p Lt TKA on 02/22/23. He had a few sessions of HHPT, and presents today amb with SPC.  PERTINENT HISTORY:  Crohn's disease, asthma, OA, hx Rt wrist fx 2022, bil RTC repair  PAIN:  Are you having  pain? Yes: NPRS scale: 5 currently, up to 8, at best 3/10 Pain location: Lt knee Pain description: sharp, shooting, aching Aggravating factors: sitting, lying, walking too long, bending the knee Relieving factors: medication, ice, repositioning  PRECAUTIONS:  None  RED FLAGS: None   WEIGHT BEARING RESTRICTIONS:  No  FALLS:  Has patient fallen in last 6 months? 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 device utilized: Single point cane Level of assistance: Modified independence Comments: decr stance on Lt; decr hip/knee flexion on Lt   EDEMA: 03/13/23 Knee Joint Line: Rt: 39.2 cm  / Lt: 46 cm   LOWER EXTREMITY ROM:     ROM Right eval Left eval  Knee flexion  A: 45 P: 50 (54 sitting with percussive device)  Knee extension A: 0 A: -19 (seated LAQ) P: -4   (Blank rows = not tested)   LOWER EXTREMITY MMT:    03/13/23: not formally tested; grossly 3-/5  MMT Right eval Left eval  Knee flexion    Knee extension     (Blank rows = not tested)     TREATMENT:  DATE:  03/13/23 TherEx: See HEP - demonstrated exercises with trial reps performed PRN for comprehension; 5-10 reps of each exercise with mod cues   Manual Seated Lt knee PROM to tolerance, use of IASTM  percussive device to Lt quad to help relax - improved passive knee flexion sitting following use of percussive device     PATIENT EDUCATION:  Education details: HEP Person educated: Patient Education method: Programmer, multimedia, Facilities manager, and Handouts Education comprehension: verbalized understanding, returned demonstration, and needs further education  HOME EXERCISE PROGRAM: Access Code: [email or web address] URL: [email or web address] Date: 03/13/2023 Prepared by: Moshe Cipro  Exercises - Long Sitting Quad Set  - 5-10 x daily - 7 x weekly - 1 sets - 5-10 reps - 5 sec hold - Supine Heel Slide with Strap  - 5-10 x daily - 7 x weekly - 1 sets - 5-10 reps - 5 sec hold - Seated Knee Extension AROM  - 5-10 x daily - 7 x weekly - 1 sets - 5-10 reps - 3-5 sec hold - Seated Knee Flexion AAROM  - 5-10 x daily - 7 x weekly - 1 sets - 10 reps - 10 sec hold   ASSESSMENT:  CLINICAL IMPRESSION: Patient is a 57 y.o. male who was seen today for physical therapy evaluation and treatment for Lt TKA on 02/22/23. He demonstrates significant ROM limitations as well as strength and balance deficits, gait abnormalities and expected post op pain and swelling affecting functional mobility.  He will benefit from PT to address deficits listed.   OBJECTIVE IMPAIRMENTS: Abnormal gait, decreased activity tolerance, decreased balance, decreased mobility, difficulty walking, decreased ROM, decreased strength, hypomobility, increased edema, increased fascial restrictions, increased muscle spasms, and pain.   ACTIVITY LIMITATIONS: carrying, lifting, bending, sitting, standing, squatting, sleeping, stairs, transfers, and locomotion level  PARTICIPATION LIMITATIONS: meal prep, cleaning, laundry, driving, shopping, community activity, and occupation  PERSONAL FACTORS: 3+ comorbidities: Crohn's disease, asthma, OA, hx Rt wrist fx 2022, bil RTC repair  are also affecting patient's functional outcome.   REHAB  POTENTIAL: Good  CLINICAL DECISION MAKING: Evolving/moderate complexity  EVALUATION COMPLEXITY: Moderate   GOALS: Goals reviewed with patient? Yes  SHORT TERM GOALS: Target date: 04/10/2023   Independent with initial HEP Goal status: INITIAL  2.  Lt knee AROM 10-0-75 deg for improved mobility and function Goal status: INITIAL   LONG TERM GOALS: Target date: 05/08/2023  Independent with final HEP Goal status: INITIAL  2.  FOTO score improved to 62 Goal status: INITIAL  3.  Lt knee AROM improved to 5-0-95 for improved function and mobility Goal status: INIITAL  4.  Report pain < 2/10 with standing and walking activities for improved function Goal status: INITIAL  5.  Amb with LRAD modified independent without significant gait deviations for improved function Goal status: INITIAL   PLAN:  PT FREQUENCY:  3x/wk x 4 wks, then 2x/wk x 4 wks  PT DURATION: 8 weeks  PLANNED INTERVENTIONS: 97164- PT Re-evaluation, 97110-Therapeutic exercises, 97530- Therapeutic activity, [id number]- Neuromuscular re-education, 97535- Self Care, [id number]- Manual therapy, [id number]- Gait training, [phone]- Aquatic Therapy, 97014- Electrical stimulation (unattended), 97016- Vasopneumatic device, Patient/Family education, Balance training, Stair training, Taping, Dry Needling, DME instructions, Cryotherapy, and Moist heat.  PLAN FOR NEXT SESSION: review HEP, aggressive ROM focus initially; utilize percussive device to help relax quads with flexion   NEXT MD VISIT: 04/04/23   Clarita Crane, PT, DPT 03/13/23 10:28 AM

## 2023-03-14 ENCOUNTER — Ambulatory Visit: Payer: BC Managed Care – PPO | Admitting: Rehabilitative and Restorative Service Providers"

## 2023-03-14 ENCOUNTER — Encounter: Payer: Self-pay | Admitting: Rehabilitative and Restorative Service Providers"

## 2023-03-14 DIAGNOSIS — M25562 Pain in left knee: Secondary | ICD-10-CM | POA: Diagnosis not present

## 2023-03-14 DIAGNOSIS — M25662 Stiffness of left knee, not elsewhere classified: Secondary | ICD-10-CM

## 2023-03-14 DIAGNOSIS — R2689 Other abnormalities of gait and mobility: Secondary | ICD-10-CM

## 2023-03-14 DIAGNOSIS — M6281 Muscle weakness (generalized): Secondary | ICD-10-CM | POA: Diagnosis not present

## 2023-03-14 DIAGNOSIS — R600 Localized edema: Secondary | ICD-10-CM

## 2023-03-14 DIAGNOSIS — R2681 Unsteadiness on feet: Secondary | ICD-10-CM

## 2023-03-14 NOTE — Therapy (Signed)
OUTPATIENT PHYSICAL THERAPY TREATMENT   Patient Name: Randy Wilson MRN: [id number] DOB:1966-03-26, 57 y.o., male Today's Date: 03/14/2023  END OF SESSION:  PT End of Session - 03/14/23 1506     Visit Number 2    Number of Visits 20    Date for PT Re-Evaluation 05/08/23    Authorization Type [employer] $52 copay    PT Start Time 1503    PT Stop Time 1553    PT Time Calculation (min) 50 min    Activity Tolerance Patient limited by pain    Behavior During Therapy [facility] for tasks assessed/performed              Past Medical History:  Diagnosis Date   Crohn's disease of small intestine (HCC) 2017   followed by dr h. danis;  s/p colon resection 11-02-2015,  on humira   History of asthma    child   History of colon polyps    History of COVID-19 05/30/2020   positive result in epicrunny nose/cough x 3 weeks all symptoms resolved   History of gout    last flare up 1 year ago per pt on 04-03-2021   OA (osteoarthritis)    Right wrist fracture    fell off ladder per pt on 02-08-2021   Right wrist fracture, closed, initial encounter 01/22/2021   Past Surgical History:  Procedure Laterality Date   ANKLE SURGERY Left 2002   bone spurs removed   COLONOSCOPY     last one 02-25-2020  by dr h. danis   HARDWARE REMOVAL Right 04/06/2021   Procedure: Right wrist removal of hardware;  Surgeon: [name], MD;  Location: [facility] [place];  Service: Orthopedics;  Laterality: Right;  with local anesthesia Needs 30 minutes   LAPAROSCOPIC ILEOCECECTOMY N/A 11/02/2015   Procedure: LAPAROSCOPIC ILEOCECECTOMY;  Surgeon: [name], MD;  Location: [facility];  Service: General;  Laterality: N/A;   ORIF WRIST FRACTURE Right 02/09/2021   Procedure: OPEN REDUCTION INTERNAL FIXATION (ORIF) WRIST FRACTURE;  Surgeon: [name], MD;  Location: [facility] [place];  Service: Orthopedics;  Laterality: Right;  WITH REGIONAL   ROTATOR CUFF REPAIR Bilateral    SHOULDER ARTHROSCOPY W/  SUBACROMIAL DECOMPRESSION AND DISTAL CLAVICLE EXCISION Left 05/07/2008   @[facility]  by dr [name];   bone spurs removed   TOTAL KNEE ARTHROPLASTY Left 02/22/2023   Procedure: LEFT TOTAL KNEE ARTHROPLASTY;  Surgeon: [name], MD;  Location: [facility];  Service: Orthopedics;  Laterality: Left;   Patient Active Problem List   Diagnosis Date Noted   Status post total left knee replacement 02/22/2023   Hyperpigmented skin lesion 10/09/2022   Acute pain of left knee 08/01/2022   Dyslipidemia 03/08/2022   Crohn's disease of small intestine (HCC) 03/01/2022   Residual hemorrhoidal skin tags 03/31/2020   Prediabetes 01/20/2020   Erectile dysfunction 01/20/2020   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   Asthma 04/24/2015   Hx of colonic polyp 11/08/2014   History of colonic polyps 09/17/2014    PCP: Georgina Quint, MD  REFERRING PROVIDER: [name]*  REFERRING DIAG: N82.956 (ICD-10-CM) - Status post total left knee replacement  Rationale for Evaluation and Treatment: Rehabilitation  THERAPY DIAG:  Acute pain of left knee  Stiffness of left knee, not elsewhere classified  Muscle weakness (generalized)  Other abnormalities of gait and mobility  Unsteadiness on feet  Localized edema  ONSET DATE: DOS: 02/22/23   SUBJECTIVE:  SUBJECTIVE STATEMENT: Pt indicated feeling tight and wanting to work it better. Bending hurts.   Reported having hip pain as well in part due to walking.   PERTINENT HISTORY:  Crohn's disease, asthma, OA, hx Rt wrist fx 2022, bil RTC repair  PAIN:   NPRS scale: arrival at rest 2-3/10, c bent 5-6/10 Pain location: Lt knee Pain description: sharp, shooting, aching Aggravating factors: sitting, lying, walking too long, bending the knee Relieving  factors: medication, ice, repositioning  PRECAUTIONS:  None  RED FLAGS: None   WEIGHT BEARING RESTRICTIONS:  No  FALLS:  Has patient fallen in last 6 months? 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 device utilized: Single point cane Level of assistance: Modified independence Comments: decr stance on Lt; decr hip/knee flexion on Lt   EDEMA: 03/13/23 Knee Joint Line: Rt: 39.2 cm  / Lt: 46 cm   LOWER EXTREMITY ROM:     ROM Right 03/13/2023 Left 03/13/2023  Knee flexion  A: 45 P: 50 (54 sitting with percussive device)  Knee extension A: 0 A: -19 (seated LAQ) P: -4   (Blank rows = not tested)   LOWER EXTREMITY MMT:    03/13/23: not formally tested; grossly 3-/5  MMT Right eval Left eval  Knee flexion    Knee extension     (Blank rows = not tested)                  TREATMENT:                                                      DATE: 03/14/2023 Therex Nustep ROM UE/LE 8 mins lvl 5 Incline gastroc stretch 30 sec x 5 bilaterally Seated Lt knee AROM 1-2 seconds 2 x 15 end range Supine heel slide 5 sec hold AAROM c strap combo with quad set 5 sec hold x 10 each  Manual Seated Lt knee flexion mobilization c movement IR/distraction with percussive device on Lt quad applied by patient during ROM.   Vasopneumatic  10 mins Lt knee medium compression 34 deg in elevation   TREATMENT:                                                       DATE: 03/13/23 TherEx: See HEP - demonstrated exercises with trial reps performed PRN for comprehension; 5-10 reps of each exercise with mod cues   Manual Seated Lt knee PROM to tolerance, use of IASTM percussive device to Lt quad to help relax - improved passive knee flexion sitting following use of percussive device     PATIENT EDUCATION:  Education details:  HEP Person educated: Patient Education method: Explanation, Demonstration, and Handouts Education comprehension: verbalized understanding, returned demonstration, and needs further education  HOME EXERCISE PROGRAM: Access Code: [email or web address] URL: [email or web address] Date: 03/13/2023 Prepared by: Moshe Cipro  Exercises - Long Sitting Quad Set  - 5-10 x daily - 7 x weekly - 1 sets - 5-10 reps - 5 sec hold - Supine Heel Slide with Strap  - 5-10 x daily - 7 x weekly - 1 sets - 5-10 reps - 5 sec hold - Seated Knee Extension AROM  - 5-10 x daily - 7 x weekly - 1 sets - 5-10 reps - 3-5 sec hold - Seated Knee Flexion AAROM  - 5-10 x daily - 7 x weekly - 1 sets - 10 reps - 10 sec hold   ASSESSMENT:  CLINICAL IMPRESSION: Guarding was present but overall improvement in tolerance to knee flexion mobility in manual and use of percussive device and longer duration stretching to reduced guarding.  Continued focus on mobility gains primary at this time.  Continued skilled PT services warranted.   OBJECTIVE IMPAIRMENTS: Abnormal gait, decreased activity tolerance, decreased balance, decreased mobility, difficulty walking, decreased ROM, decreased strength, hypomobility, increased edema, increased fascial restrictions, increased muscle spasms, and pain.   ACTIVITY LIMITATIONS: carrying, lifting, bending, sitting, standing, squatting, sleeping, stairs, transfers, and locomotion level  PARTICIPATION LIMITATIONS: meal prep, cleaning, laundry, driving, shopping, community  activity, and occupation  PERSONAL FACTORS: 3+ comorbidities: Crohn's disease, asthma, OA, hx Rt wrist fx 2022, bil RTC repair  are also affecting patient's functional outcome.   REHAB POTENTIAL: Good  CLINICAL DECISION MAKING: Evolving/moderate complexity  EVALUATION COMPLEXITY: Moderate   GOALS: Goals reviewed with patient? Yes  SHORT TERM GOALS: Target date: 04/10/2023   Independent with initial HEP Goal status:  on going 03/14/2023  2.  Lt knee AROM 10-0-75 deg for improved mobility and function Goal status:  on going 03/14/2023   LONG TERM GOALS: Target date: 05/08/2023  Independent with final HEP Goal status: INITIAL  2.  FOTO score improved to 62 Goal status: INITIAL  3.  Lt knee AROM improved to 5-0-95 for improved function and mobility Goal status: INIITAL  4.  Report pain < 2/10 with standing and walking activities for improved function Goal status: INITIAL  5.  Amb with LRAD modified independent without significant gait deviations for improved function Goal status: INITIAL   PLAN:  PT FREQUENCY:  3x/wk x 4 wks, then 2x/wk x 4 wks  PT DURATION: 8 weeks  PLANNED INTERVENTIONS: 97164- PT Re-evaluation, 97110-Therapeutic exercises, 97530- Therapeutic activity, [id number]- Neuromuscular re-education, 97535- Self Care, [id number]- Manual therapy, [id number]- Gait training, [phone]- Aquatic Therapy, 97014- Electrical stimulation (unattended), 97016- Vasopneumatic device, Patient/Family education, Balance training, Stair training, Taping, Dry Needling, DME instructions, Cryotherapy, and Moist heat.  PLAN FOR NEXT SESSION: Manual intervention for mobility gains ( percussive device on quad recommended).  Early quad activation strengthening.    NEXT MD VISIT: 04/04/23  Chyrel Masson, PT, DPT, OCS, ATC 03/14/23  3:43 PM

## 2023-03-15 ENCOUNTER — Encounter: Payer: BC Managed Care – PPO | Admitting: Physical Therapy

## 2023-03-15 ENCOUNTER — Encounter: Payer: Self-pay | Admitting: Rehabilitative and Restorative Service Providers"

## 2023-03-15 ENCOUNTER — Ambulatory Visit: Payer: BC Managed Care – PPO | Admitting: Rehabilitative and Restorative Service Providers"

## 2023-03-15 ENCOUNTER — Ambulatory Visit: Payer: BC Managed Care – PPO | Admitting: Physical Therapy

## 2023-03-15 DIAGNOSIS — M25662 Stiffness of left knee, not elsewhere classified: Secondary | ICD-10-CM

## 2023-03-15 DIAGNOSIS — R2681 Unsteadiness on feet: Secondary | ICD-10-CM

## 2023-03-15 DIAGNOSIS — M25562 Pain in left knee: Secondary | ICD-10-CM

## 2023-03-15 DIAGNOSIS — R2689 Other abnormalities of gait and mobility: Secondary | ICD-10-CM

## 2023-03-15 DIAGNOSIS — M6281 Muscle weakness (generalized): Secondary | ICD-10-CM | POA: Diagnosis not present

## 2023-03-15 DIAGNOSIS — R600 Localized edema: Secondary | ICD-10-CM

## 2023-03-15 NOTE — Therapy (Signed)
OUTPATIENT PHYSICAL THERAPY TREATMENT   Patient Name: Randy Wilson MRN: [id number] DOB:02/23/1966, 57 y.o., male Today's Date: 03/15/2023  END OF SESSION:  PT End of Session - 03/15/23 0912     Visit Number 3    Number of Visits 20    Date for PT Re-Evaluation 05/08/23    Authorization Type [employer] $52 copay    PT Start Time 0908    PT Stop Time 0958    PT Time Calculation (min) 50 min    Activity Tolerance Patient limited by pain    Behavior During Therapy [facility] for tasks assessed/performed               Past Medical History:  Diagnosis Date   Crohn's disease of small intestine (HCC) 2017   followed by dr h. danis;  s/p colon resection 11-02-2015,  on humira   History of asthma    child   History of colon polyps    History of COVID-19 05/30/2020   positive result in epicrunny nose/cough x 3 weeks all symptoms resolved   History of gout    last flare up 1 year ago per pt on 04-03-2021   OA (osteoarthritis)    Right wrist fracture    fell off ladder per pt on 02-08-2021   Right wrist fracture, closed, initial encounter 01/22/2021   Past Surgical History:  Procedure Laterality Date   ANKLE SURGERY Left 2002   bone spurs removed   COLONOSCOPY     last one 02-25-2020  by dr h. danis   HARDWARE REMOVAL Right 04/06/2021   Procedure: Right wrist removal of hardware;  Surgeon: [name], MD;  Location: [facility];  Service: Orthopedics;  Laterality: Right;  with local anesthesia Needs 30 minutes   LAPAROSCOPIC ILEOCECECTOMY N/A 11/02/2015   Procedure: LAPAROSCOPIC ILEOCECECTOMY;  Surgeon: [name], MD;  Location: [facility];  Service: General;  Laterality: N/A;   ORIF WRIST FRACTURE Right 02/09/2021   Procedure: OPEN REDUCTION INTERNAL FIXATION (ORIF) WRIST FRACTURE;  Surgeon: [name], MD;  Location: [facility];  Service: Orthopedics;  Laterality: Right;  WITH REGIONAL   ROTATOR CUFF REPAIR Bilateral    SHOULDER ARTHROSCOPY W/  SUBACROMIAL DECOMPRESSION AND DISTAL CLAVICLE EXCISION Left 05/07/2008   @[facility]  by dr [name];   bone spurs removed   TOTAL KNEE ARTHROPLASTY Left 02/22/2023   Procedure: LEFT TOTAL KNEE ARTHROPLASTY;  Surgeon: [name], MD;  Location: [facility];  Service: Orthopedics;  Laterality: Left;   Patient Active Problem List   Diagnosis Date Noted   Status post total left knee replacement 02/22/2023   Hyperpigmented skin lesion 10/09/2022   Acute pain of left knee 08/01/2022   Dyslipidemia 03/08/2022   Crohn's disease of small intestine (HCC) 03/01/2022   Residual hemorrhoidal skin tags 03/31/2020   Prediabetes 01/20/2020   Erectile dysfunction 01/20/2020   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   Asthma 04/24/2015   Hx of colonic polyp 11/08/2014   History of colonic polyps 09/17/2014    PCP: Georgina Quint, MD  REFERRING PROVIDER: [name]*  REFERRING DIAG: U72.536 (ICD-10-CM) - Status post total left knee replacement  Rationale for Evaluation and Treatment: Rehabilitation  THERAPY DIAG:  Acute pain of left knee  Stiffness of left knee, not elsewhere classified  Muscle weakness (generalized)  Other abnormalities of gait and mobility  Unsteadiness on feet  Localized edema  ONSET DATE: DOS: 02/22/23   SUBJECTIVE:  SUBJECTIVE STATEMENT: Pt indicated feeling like he worked hard yesterday and was feeling like it was moving.  Woke up tight today.    PERTINENT HISTORY:  Crohn's disease, asthma, OA, hx Rt wrist fx 2022, bil RTC repair  PAIN:   NPRS scale: mild symptoms at rest.   Pain location: Lt knee Pain description: sharp, shooting, aching Aggravating factors: sitting, lying, walking too long, bending the knee Relieving factors: medication, ice,  repositioning  PRECAUTIONS:  None  RED FLAGS: None   WEIGHT BEARING RESTRICTIONS:  No  FALLS:  Has patient fallen in last 6 months? 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 device utilized: Single point cane Level of assistance: Modified independence Comments: decr stance on Lt; decr hip/knee flexion on Lt   EDEMA: 03/13/23 Knee Joint Line: Rt: 39.2 cm  / Lt: 46 cm   LOWER EXTREMITY ROM:     ROM Right 03/13/2023 Left 03/13/2023  Knee flexion  A: 45 P: 50 (54 sitting with percussive device)  Knee extension A: 0 A: -19 (seated LAQ) P: -4   (Blank rows = not tested)   LOWER EXTREMITY MMT:    03/13/23: not formally tested; grossly 3-/5  MMT Right eval Left eval  Knee flexion    Knee extension     (Blank rows = not tested)                  TREATMENT:                                                      DATE: 03/15/2023 Therex Nustep ROM UE/LE 8 mins lvl 6 with flexion hold focus  Incline gastroc stretch 30 sec x 3 bilaterally Seated Lt knee AROM 1-2 seconds 2 x 15 end range 2.5 lbs  Seated Lt knee AAROM with Rt leg overpressure 15 sec x 5    TherActivity (to promote improved WB acceptance for transfers, ambulation and stair navigation improvements)  Leg press in available range :  double leg  x 15 100 lbs ,   single leg Lt  50 lbs  x 15  Retro step x 15 bilateral with  occasional HHA on bar  Manual Seated Lt knee flexion mobilization c movement IR/distraction with percussive device on Lt quad applied by patient during ROM.  Vasopneumatic  10 mins Lt knee medium compression 34 deg in elevation   TREATMENT:                                                      DATE: 03/14/2023 Therex Nustep ROM UE/LE 8 mins lvl 5 Incline gastroc stretch 30  sec x 5 bilaterally Seated Lt knee AROM 1-2 seconds 2 x 15 end range Supine heel slide 5 sec hold AAROM c strap combo with quad set 5 sec hold x 10 each  Manual Seated Lt knee flexion mobilization c movement IR/distraction with percussive device on Lt quad applied by patient during ROM.   Vasopneumatic  10 mins Lt knee medium compression 34 deg in elevation   TREATMENT:                                                      DATE: 03/13/23 TherEx: See HEP - demonstrated exercises with trial reps performed PRN for comprehension; 5-10 reps of each exercise with mod cues   Manual Seated Lt knee PROM to tolerance, use of IASTM percussive device to Lt quad to help relax - improved passive knee flexion sitting following use of percussive device     PATIENT EDUCATION:  Education details: HEP Person educated: Patient Education method: Programmer, multimedia, Facilities manager, and Handouts Education comprehension: verbalized understanding, returned demonstration, and needs further education  HOME EXERCISE PROGRAM: Access Code: [email or web address] URL: [email or web address] Date: 03/13/2023 Prepared by: Moshe Cipro  Exercises - Long Sitting Quad Set  - 5-10 x daily - 7 x weekly - 1 sets - 5-10 reps - 5 sec hold - Supine Heel Slide with Strap  - 5-10 x daily - 7 x weekly - 1 sets - 5-10 reps - 5 sec hold - Seated Knee Extension AROM  - 5-10 x daily - 7 x weekly - 1 sets - 5-10 reps - 3-5 sec hold - Seated Knee Flexion AAROM  - 5-10 x daily - 7 x weekly - 1 sets - 10 reps - 10 sec hold   ASSESSMENT:  CLINICAL  IMPRESSION: Quality and tolerance to flexion continued to show improvements today.  Plan to continue to utilize functional movements and manual to promote improvements in mobility.  Continued skilled PT services.   OBJECTIVE IMPAIRMENTS: Abnormal gait, decreased activity tolerance, decreased balance, decreased mobility, difficulty walking, decreased ROM, decreased strength, hypomobility, increased edema, increased fascial restrictions, increased muscle spasms, and pain.   ACTIVITY LIMITATIONS: carrying, lifting, bending, sitting, standing, squatting, sleeping, stairs, transfers, and locomotion level  PARTICIPATION LIMITATIONS: meal prep, cleaning, laundry, driving, shopping, community activity, and occupation  PERSONAL FACTORS: 3+ comorbidities: Crohn's disease, asthma, OA, hx Rt wrist fx 2022, bil RTC repair  are also affecting patient's functional outcome.   REHAB POTENTIAL: Good  CLINICAL DECISION MAKING: Evolving/moderate complexity  EVALUATION COMPLEXITY: Moderate   GOALS: Goals reviewed with patient? Yes  SHORT TERM GOALS: Target date: 04/10/2023   Independent with initial HEP Goal status:  on going 03/14/2023  2.  Lt knee AROM 10-0-75 deg for improved mobility and function Goal status:  on going 03/14/2023   LONG TERM GOALS: Target date: 05/08/2023  Independent with final HEP Goal status: INITIAL  2.  FOTO score improved to 62 Goal status: INITIAL  3.  Lt knee AROM improved to 5-0-95 for improved function and mobility Goal status: INIITAL  4.  Report pain < 2/10 with standing and walking activities for improved function Goal status: INITIAL  5.  Amb with LRAD modified independent without significant gait deviations for improved function Goal status: INITIAL   PLAN:  PT FREQUENCY:  3x/wk x 4 wks, then 2x/wk x 4 wks  PT DURATION: 8 weeks  PLANNED INTERVENTIONS: 97164- PT Re-evaluation, 97110-Therapeutic exercises, 97530- Therapeutic activity, [id number]-  Neuromuscular re-education, 97535- Self Care, 16109- Manual therapy, [id number]- Gait training, [phone]- Aquatic Therapy, 97014- Electrical stimulation (unattended), 97016- Vasopneumatic device, Patient/Family education, Balance training, Stair training, Taping, Dry Needling, DME instructions, Cryotherapy, and Moist heat.  PLAN FOR NEXT SESSION: Manual intervention for mobility gains ( percussive device on quad recommended).   Early static balance/functional WB improvements.    NEXT MD VISIT: 04/04/23  Chyrel Masson, PT, DPT, OCS, ATC 03/15/23  9:56 AM

## 2023-03-18 ENCOUNTER — Other Ambulatory Visit: Payer: Self-pay

## 2023-03-18 MED ORDER — TIZANIDINE HCL 4 MG PO TABS: 4.0000 mg | ORAL_TABLET | Freq: Four times a day (QID) | ORAL | 1 refills | Status: DC | PRN

## 2023-03-19 ENCOUNTER — Ambulatory Visit: Payer: BC Managed Care – PPO | Admitting: Physical Therapy

## 2023-03-19 ENCOUNTER — Encounter: Payer: Self-pay | Admitting: Physical Therapy

## 2023-03-19 DIAGNOSIS — R2681 Unsteadiness on feet: Secondary | ICD-10-CM

## 2023-03-19 DIAGNOSIS — R2689 Other abnormalities of gait and mobility: Secondary | ICD-10-CM

## 2023-03-19 DIAGNOSIS — M25562 Pain in left knee: Secondary | ICD-10-CM

## 2023-03-19 DIAGNOSIS — M6281 Muscle weakness (generalized): Secondary | ICD-10-CM | POA: Diagnosis not present

## 2023-03-19 DIAGNOSIS — M25662 Stiffness of left knee, not elsewhere classified: Secondary | ICD-10-CM

## 2023-03-19 DIAGNOSIS — R600 Localized edema: Secondary | ICD-10-CM

## 2023-03-19 NOTE — Therapy (Signed)
OUTPATIENT PHYSICAL THERAPY TREATMENT   Patient Name: Randy Wilson MRN: [id number] DOB:[birth year], 57 y.o., male Today's Date: 03/19/2023  END OF SESSION:  PT End of Session - 03/19/23 0932     Visit Number 4    Number of Visits 20    Date for PT Re-Evaluation 05/08/23    Authorization Type [employer] $52 copay    PT Start Time 0932    PT Stop Time 1015    PT Time Calculation (min) 43 min    Activity Tolerance Patient limited by pain    Behavior During Therapy [facility] for tasks assessed/performed                Past Medical History:  Diagnosis Date   Crohn's disease of small intestine (HCC) 2017   followed by dr h. danis;  s/p colon resection 11-02-2015,  on humira   History of asthma    child   History of colon polyps    History of COVID-19 05/30/2020   positive result in epicrunny nose/cough x 3 weeks all symptoms resolved   History of gout    last flare up 1 year ago per pt on 04-03-2021   OA (osteoarthritis)    Right wrist fracture    fell off ladder per pt on 02-08-2021   Right wrist fracture, closed, initial encounter 01/22/2021   Past Surgical History:  Procedure Laterality Date   ANKLE SURGERY Left 2002   bone spurs removed   COLONOSCOPY     last one 02-25-2020  by dr h. danis   HARDWARE REMOVAL Right 04/06/2021   Procedure: Right wrist removal of hardware;  Surgeon: [name], MD;  Location: [facility];  Service: Orthopedics;  Laterality: Right;  with local anesthesia Needs 30 minutes   LAPAROSCOPIC ILEOCECECTOMY N/A 11/02/2015   Procedure: LAPAROSCOPIC ILEOCECECTOMY;  Surgeon: [name], MD;  Location: [facility];  Service: General;  Laterality: N/A;   ORIF WRIST FRACTURE Right 02/09/2021   Procedure: OPEN REDUCTION INTERNAL FIXATION (ORIF) WRIST FRACTURE;  Surgeon: [name], MD;  Location: [facility] [place];  Service: Orthopedics;  Laterality: Right;  WITH REGIONAL   ROTATOR CUFF REPAIR Bilateral    SHOULDER ARTHROSCOPY W/  SUBACROMIAL DECOMPRESSION AND DISTAL CLAVICLE EXCISION Left 05/07/2008   @[facility]  by dr [name];   bone spurs removed   TOTAL KNEE ARTHROPLASTY Left 02/22/2023   Procedure: LEFT TOTAL KNEE ARTHROPLASTY;  Surgeon: [name], MD;  Location: [facility];  Service: Orthopedics;  Laterality: Left;   Patient Active Problem List   Diagnosis Date Noted   Status post total left knee replacement 02/22/2023   Hyperpigmented skin lesion 10/09/2022   Acute pain of left knee 08/01/2022   Dyslipidemia 03/08/2022   Crohn's disease of small intestine (HCC) 03/01/2022   Residual hemorrhoidal skin tags 03/31/2020   Prediabetes 01/20/2020   Erectile dysfunction 01/20/2020   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   Asthma 04/24/2015   Hx of colonic polyp 11/08/2014   History of colonic polyps 09/17/2014    PCP: Georgina Quint, MD  REFERRING PROVIDER: [name]*  REFERRING DIAG: G29.528 (ICD-10-CM) - Status post total left knee replacement  Rationale for Evaluation and Treatment: Rehabilitation  THERAPY DIAG:  Acute pain of left knee  Stiffness of left knee, not elsewhere classified  Muscle weakness (generalized)  Other abnormalities of gait and mobility  Unsteadiness on feet  Localized edema  ONSET DATE: DOS: 02/22/23   SUBJECTIVE:  SUBJECTIVE STATEMENT: Pt states he's been trying to warm up his knee.   PERTINENT HISTORY:  Crohn's disease, asthma, OA, hx Rt wrist fx 2022, bil RTC repair  PAIN:   NPRS scale: 6-7/10 currently  Pain location: Lt knee Pain description: sharp, shooting, aching Aggravating factors: sitting, lying, walking too long, bending the knee Relieving factors: medication, ice, repositioning  PRECAUTIONS:  None  RED FLAGS: None   WEIGHT BEARING  RESTRICTIONS:  No  FALLS:  Has patient fallen in last 6 months? 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 device utilized: Single point cane Level of assistance: Modified independence Comments: decr stance on Lt; decr hip/knee flexion on Lt   EDEMA: 03/13/23 Knee Joint Line: Rt: 39.2 cm  / Lt: 46 cm   LOWER EXTREMITY ROM:     ROM Right 03/13/2023 Left 03/13/2023 Left 03/19/23  Knee flexion  A: 45 P: 50 (54 sitting with percussive device) A: 65 P: 73  Knee extension A: 0 A: -19 (seated LAQ) P: -4 A: -10 P: -4   (Blank rows = not tested)   LOWER EXTREMITY MMT:    03/13/23: not formally tested; grossly 3-/5  MMT Right eval Left eval  Knee flexion    Knee extension     (Blank rows = not tested)                 Treatment:                                                DATE: 03/19/23 Therapeutic Exercise: Half revolutions on precor recumbent bike x 5 min with massage gun for quad deactivation Sitting Knee flexion self stretch scooting forward 3x30 sec Hamstring stretch 2x30 sec Quad set 2x10x3 sec; with PT overpressure 3x10 sec Supine SLR 2x10 SAQ 2x10 Prone Quad stretch with strap 2x30" Quad stretch contract/relax 5x5" Hamstring curl 2x10 with self overpressure Manual Therapy: Lt knee mobilization/over pressure for flexion and ext Modalities: Vaso 10 mins Lt knee medium  compression 34 deg in elevation     TREATMENT:                                                      DATE: 03/15/2023 Therex Nustep ROM UE/LE 8 mins lvl 6 with flexion hold focus  Incline gastroc stretch 30 sec x 3 bilaterally Seated Lt knee AROM 1-2 seconds 2 x 15 end range 2.5 lbs  Seated Lt knee AAROM with Rt leg overpressure 15 sec x 5    TherActivity (to promote improved WB acceptance for transfers, ambulation and stair navigation improvements)  Leg press in available range :  double leg  x 15 100 lbs ,   single leg Lt  50 lbs  x 15  Retro step x 15 bilateral with occasional HHA on bar  Manual Seated Lt knee flexion mobilization c movement IR/distraction with percussive device on Lt quad applied by patient during ROM.  Vasopneumatic  10 mins Lt knee medium compression 34 deg in elevation   TREATMENT:                                                      DATE: 03/14/2023 Therex Nustep ROM UE/LE 8 mins lvl 5 Incline gastroc stretch 30 sec x 5 bilaterally Seated Lt knee AROM 1-2 seconds 2 x 15 end range Supine heel slide 5 sec hold AAROM c strap combo with quad set 5 sec hold x 10 each  Manual Seated Lt knee flexion mobilization c movement IR/distraction with percussive device on Lt quad applied by patient during ROM.   Vasopneumatic  10 mins Lt knee medium compression 34 deg in elevation   TREATMENT:                                                      DATE: 03/13/23 TherEx: See HEP - demonstrated exercises with trial reps performed PRN for comprehension; 5-10 reps of each exercise with mod cues   Manual Seated Lt knee PROM to tolerance, use of IASTM percussive device to Lt quad to help relax - improved passive knee flexion sitting following use of percussive device     PATIENT EDUCATION:  Education details: HEP Person educated: Patient Education method: Programmer, multimedia, Facilities manager, and Handouts Education comprehension: verbalized understanding, returned  demonstration, and needs further education  HOME EXERCISE PROGRAM: Access Code: [email or web address] URL: [email or web address] Date: 03/13/2023 Prepared by: Moshe Cipro  Exercises - Long Sitting Quad Set  - 5-10 x daily - 7 x weekly - 1 sets - 5-10 reps - 5 sec hold - Supine Heel Slide with Strap  - 5-10 x daily - 7 x weekly - 1 sets - 5-10 reps - 5 sec hold - Seated Knee Extension AROM  - 5-10 x daily - 7 x weekly - 1 sets - 5-10 reps - 3-5 sec hold - Seated Knee Flexion AAROM  - 5-10 x daily - 7 x weekly - 1 sets - 10 reps - 10 sec hold   ASSESSMENT:  CLINICAL IMPRESSION: Continued improvements in knee ROM noted -- pt is almost to his STG. Worked on quad and hamstring strengthening primarily today with good pt tolerance.    GOALS: Goals reviewed with patient? Yes  SHORT TERM GOALS: Target date: 04/10/2023   Independent with initial HEP Goal status:  on going 03/14/2023  2.  Lt knee AROM 10-0-75 deg for improved mobility and function Goal status:  on going 03/14/2023   LONG TERM GOALS: Target date: 05/08/2023  Independent with final HEP Goal status: INITIAL  2.  FOTO score improved to 62 Goal status: INITIAL  3.  Lt knee AROM improved to 5-0-95 for improved function and mobility Goal status: INIITAL  4.  Report pain < 2/10 with standing and walking activities for improved function Goal status: INITIAL  5.  Amb with LRAD  modified independent without significant gait deviations for improved function Goal status: INITIAL   PLAN:  PT FREQUENCY:  3x/wk x 4 wks, then 2x/wk x 4 wks  PT DURATION: 8 weeks  PLANNED INTERVENTIONS: 97164- PT Re-evaluation, 97110-Therapeutic exercises, 97530- Therapeutic activity, [id number]- Neuromuscular re-education, 97535- Self Care, 78295- Manual therapy, [id number]- Gait training, [phone]- Aquatic Therapy, 97014- Electrical stimulation (unattended), 97016- Vasopneumatic device, Patient/Family education, Balance training, Stair  training, Taping, Dry Needling, DME instructions, Cryotherapy, and Moist heat.  PLAN FOR NEXT SESSION: Manual intervention for mobility gains ( percussive device on quad recommended).   Early static balance/functional WB improvements.    NEXT MD VISIT: 04/04/23  Vernon Prey April Dell Ponto, PT 03/19/23  9:32 AM

## 2023-03-21 ENCOUNTER — Ambulatory Visit: Payer: BC Managed Care – PPO | Admitting: Physical Therapy

## 2023-03-21 ENCOUNTER — Encounter: Payer: Self-pay | Admitting: Physical Therapy

## 2023-03-21 DIAGNOSIS — M25562 Pain in left knee: Secondary | ICD-10-CM | POA: Diagnosis not present

## 2023-03-21 DIAGNOSIS — M25662 Stiffness of left knee, not elsewhere classified: Secondary | ICD-10-CM | POA: Diagnosis not present

## 2023-03-21 DIAGNOSIS — M6281 Muscle weakness (generalized): Secondary | ICD-10-CM | POA: Diagnosis not present

## 2023-03-21 DIAGNOSIS — R2689 Other abnormalities of gait and mobility: Secondary | ICD-10-CM

## 2023-03-21 DIAGNOSIS — R2681 Unsteadiness on feet: Secondary | ICD-10-CM

## 2023-03-21 NOTE — Therapy (Signed)
OUTPATIENT PHYSICAL THERAPY TREATMENT   Patient Name: Randy Wilson MRN: [id number] DOB:1965/06/12, 57 y.o., male Today's Date: 03/21/2023  END OF SESSION:  PT End of Session - 03/21/23 1104     Visit Number 5    Number of Visits 20    Date for PT Re-Evaluation 05/08/23    Authorization Type BCBS $52 copay    PT Start Time 1101    PT Stop Time 1145    PT Time Calculation (min) 44 min    Activity Tolerance Patient limited by pain    Behavior During Therapy [facility] for tasks assessed/performed               Past Medical History:  Diagnosis Date   Crohn's disease of small intestine (HCC) 2017   followed by dr h. danis;  s/p colon resection 11-02-2015,  on humira   History of asthma    child   History of colon polyps    History of COVID-19 05/30/2020   positive result in epicrunny nose/cough x 3 weeks all symptoms resolved   History of gout    last flare up 1 year ago per pt on 04-03-2021   OA (osteoarthritis)    Right wrist fracture    fell off ladder per pt on 02-08-2021   Right wrist fracture, closed, initial encounter 01/22/2021   Past Surgical History:  Procedure Laterality Date   ANKLE SURGERY Left 2002   bone spurs removed   COLONOSCOPY     last one 02-25-2020  by dr h. danis   HARDWARE REMOVAL Right 04/06/2021   Procedure: Right wrist removal of hardware;  Surgeon: [name], MD;  Location: [facility];  Service: Orthopedics;  Laterality: Right;  with local anesthesia Needs 30 minutes   LAPAROSCOPIC ILEOCECECTOMY N/A 11/02/2015   Procedure: LAPAROSCOPIC ILEOCECECTOMY;  Surgeon: [name], MD;  Location: [facility];  Service: General;  Laterality: N/A;   ORIF WRIST FRACTURE Right 02/09/2021   Procedure: OPEN REDUCTION INTERNAL FIXATION (ORIF) WRIST FRACTURE;  Surgeon: [name], MD;  Location: [facility] [place];  Service: Orthopedics;  Laterality: Right;  WITH REGIONAL   ROTATOR CUFF REPAIR Bilateral    SHOULDER ARTHROSCOPY W/  SUBACROMIAL DECOMPRESSION AND DISTAL CLAVICLE EXCISION Left 05/07/2008   @[facility]  by dr [name];   bone spurs removed   TOTAL KNEE ARTHROPLASTY Left 02/22/2023   Procedure: LEFT TOTAL KNEE ARTHROPLASTY;  Surgeon: [name], MD;  Location: [facility];  Service: Orthopedics;  Laterality: Left;   Patient Active Problem List   Diagnosis Date Noted   Status post total left knee replacement 02/22/2023   Hyperpigmented skin lesion 10/09/2022   Acute pain of left knee 08/01/2022   Dyslipidemia 03/08/2022   Crohn's disease of small intestine (HCC) 03/01/2022   Residual hemorrhoidal skin tags 03/31/2020   Prediabetes 01/20/2020   Erectile dysfunction 01/20/2020   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   Asthma 04/24/2015   Hx of colonic polyp 11/08/2014   History of colonic polyps 09/17/2014    PCP: Georgina Quint, MD  REFERRING PROVIDER: [name]*  REFERRING DIAG: W09.811 (ICD-10-CM) - Status post total left knee replacement  Rationale for Evaluation and Treatment: Rehabilitation  THERAPY DIAG:  Acute pain of left knee  Stiffness of left knee, not elsewhere classified  Muscle weakness (generalized)  Other abnormalities of gait and mobility  Unsteadiness on feet  ONSET DATE: DOS: 02/22/23   SUBJECTIVE:  SUBJECTIVE STATEMENT: Pt reports his knee is feeling stiff and tight today.   PERTINENT HISTORY:  Crohn's disease, asthma, OA, hx Rt wrist fx 2022, bil RTC repair  PAIN:   NPRS scale: 6-7/10 currently  Pain location: Lt knee Pain description: sharp, shooting, aching Aggravating factors: sitting, lying, walking too long, bending the knee Relieving factors: medication, ice, repositioning  PRECAUTIONS:  None  RED FLAGS: None   WEIGHT BEARING RESTRICTIONS:   No  FALLS:  Has patient fallen in last 6 months? 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 device utilized: Single point cane Level of assistance: Modified independence Comments: decr stance on Lt; decr hip/knee flexion on Lt   EDEMA: 03/13/23 Knee Joint Line: Rt: 39.2 cm  / Lt: 46 cm   LOWER EXTREMITY ROM:     ROM Right 03/13/2023 Left 03/13/2023 Left 03/19/23  Knee flexion  A: 45 P: 50 (54 sitting with percussive device) A: 65 P: 73  Knee extension A: 0 A: -19 (seated LAQ) P: -4 A: -10 P: -4   (Blank rows = not tested)   LOWER EXTREMITY MMT:    03/13/23: not formally tested; grossly 3-/5  MMT Right eval Left eval  Knee flexion    Knee extension     (Blank rows = not tested)                 Treatment:                                                DATE: 03/21/23 Therapeutic Exercise: Half revolutions on precor recumbent bike x 5 min with massage gun for quad deactivation Sitting Knee flexion self stretch scooting forward 3x3x10 sec (able to obtain 82 deg max flexion) Heel slide 2x10 Sit<>stand 2x10 Hamstring stretch 2x30 sec Quad set 2x10x3 sec Standing Terminal knee ext green TB 2x10x3 sec Gait Training: Ambulating with no a/d 2x100', cues for heel strike, hip/trunk extension during stance, increasing knee flexion during swing Modalities: Vaso 10 mins Lt knee medium  compression 34 deg in elevation   Treatment:                                                DATE: 03/19/23 Therapeutic Exercise: Half revolutions on precor recumbent bike x 5 min with massage gun for quad deactivation Sitting Knee flexion self stretch scooting forward 3x30 sec Hamstring stretch 2x30 sec Quad set 2x10x3 sec; with PT overpressure 3x10 sec Supine SLR 2x10 SAQ 2x10 Prone Quad stretch with strap 2x30" Quad stretch contract/relax 5x5" Hamstring curl 2x10 with self overpressure Manual Therapy: Lt knee mobilization/over pressure for flexion and ext Modalities: Vaso 10 mins Lt knee medium compression  34 deg in elevation     TREATMENT:                                                      DATE: 03/15/2023 Therex Nustep ROM UE/LE 8 mins lvl 6 with flexion hold focus  Incline gastroc stretch 30 sec x 3 bilaterally Seated Lt knee AROM 1-2 seconds 2 x 15 end range 2.5 lbs  Seated Lt knee AAROM with Rt leg overpressure 15 sec x 5    TherActivity (to promote improved WB acceptance for transfers, ambulation and stair navigation improvements)  Leg press in available range :  double leg  x 15 100 lbs ,   single leg Lt  50 lbs  x 15  Retro step x 15 bilateral with occasional HHA on bar  Manual Seated Lt knee flexion mobilization c movement IR/distraction with percussive device on Lt quad applied by patient during ROM.  Vasopneumatic  10 mins Lt knee medium compression 34 deg in elevation   TREATMENT:                                                      DATE: 03/14/2023 Therex Nustep ROM UE/LE 8 mins lvl 5 Incline gastroc stretch 30 sec x 5 bilaterally Seated Lt knee AROM 1-2 seconds 2 x 15 end range Supine heel slide 5 sec hold AAROM c strap combo with quad set 5 sec hold x 10 each  Manual Seated Lt knee flexion mobilization c movement IR/distraction with percussive device on Lt quad applied by patient during ROM.   Vasopneumatic  10 mins Lt knee medium compression 34  deg in elevation   TREATMENT:                                                      DATE: 03/13/23 TherEx: See HEP - demonstrated exercises with trial reps performed PRN for comprehension; 5-10 reps of each exercise with mod cues   Manual Seated Lt knee PROM to tolerance, use of IASTM percussive device to Lt quad to help relax - improved passive knee flexion sitting following use of percussive device     PATIENT EDUCATION:  Education details: HEP Person educated: Patient Education method: Programmer, multimedia, Facilities manager, and Handouts Education comprehension: verbalized understanding, returned demonstration, and needs further education  HOME EXERCISE PROGRAM: Access Code: [email or web address] URL: [email or web address] Date: 03/21/2023 Prepared by: Vernon Prey April Kirstie Peri  Exercises - Long Sitting Quad Set  - 5-10 x daily - 7 x weekly - 1 sets - 5-10 reps - 5 sec hold - Supine Heel Slide with Strap  - 5-10 x daily - 7 x weekly - 1 sets - 5-10 reps - 5 sec hold - Seated Knee Extension AROM  - 5-10 x daily - 7 x weekly - 1 sets - 5-10 reps - 3-5 sec hold - Prone Quadriceps Stretch with Strap  - 1 x daily - 7 x weekly - 2 sets - 30 sec hold - Prone Knee Flexion  - 1 x  daily - 7 x weekly - 2 sets - 10 reps - Sit to Stand  - 1 x daily - 7 x weekly - 2 sets - 10 reps   ASSESSMENT:  CLINICAL IMPRESSION: Pt continues to make gains. Able to meet his STGs this session. Included more strengthening in standing today with good pt tolerance. Worked on gait without a/d -- able to demonstrate good reciprocal gait pattern. Pt will continue to benefit from PT to fully meet all of his rehab goals    GOALS: Goals reviewed with patient? Yes  SHORT TERM GOALS: Target date: 04/10/2023   Independent with initial HEP Goal status:  on going 03/14/2023  2.  Lt knee AROM 10-0-75 deg for improved mobility and function Goal status:  Met 03/21/23   LONG TERM GOALS: Target date:  05/08/2023  Independent with final HEP Goal status: INITIAL  2.  FOTO score improved to 62 Goal status: INITIAL  3.  Lt knee AROM improved to 5-0-95 for improved function and mobility Goal status: INIITAL  4.  Report pain < 2/10 with standing and walking activities for improved function Goal status: INITIAL  5.  Amb with LRAD modified independent without significant gait deviations for improved function Goal status: INITIAL   PLAN:  PT FREQUENCY:  3x/wk x 4 wks, then 2x/wk x 4 wks  PT DURATION: 8 weeks  PLANNED INTERVENTIONS: 97164- PT Re-evaluation, 97110-Therapeutic exercises, 97530- Therapeutic activity, [id number]- Neuromuscular re-education, 97535- Self Care, [id number]- Manual therapy, [id number]- Gait training, [phone]- Aquatic Therapy, 97014- Electrical stimulation (unattended), 97016- Vasopneumatic device, Patient/Family education, Balance training, Stair training, Taping, Dry Needling, DME instructions, Cryotherapy, and Moist heat.  PLAN FOR NEXT SESSION: Manual intervention for mobility gains ( percussive device on quad recommended).   Early static balance/functional WB improvements.    NEXT MD VISIT: 04/04/23  Vernon Prey April Dell Ponto, PT 03/21/23  11:04 AM

## 2023-03-22 ENCOUNTER — Telehealth: Payer: Self-pay | Admitting: Rehabilitative and Restorative Service Providers"

## 2023-03-22 ENCOUNTER — Encounter: Payer: BC Managed Care – PPO | Admitting: Rehabilitative and Restorative Service Providers"

## 2023-03-22 NOTE — Telephone Encounter (Signed)
Randy Wilson was reached by phone.  He says he called in to re-schedule but did not get a call back yet.  We scheduled him Monday 10/ 28/2024 at 11 and he will keep his Wednesday 10/30 and Friday 11/1 at 8:45.

## 2023-03-25 ENCOUNTER — Encounter: Payer: Self-pay | Admitting: Physical Therapy

## 2023-03-25 ENCOUNTER — Ambulatory Visit: Payer: BC Managed Care – PPO | Admitting: Physical Therapy

## 2023-03-25 DIAGNOSIS — R2689 Other abnormalities of gait and mobility: Secondary | ICD-10-CM

## 2023-03-25 DIAGNOSIS — M6281 Muscle weakness (generalized): Secondary | ICD-10-CM

## 2023-03-25 DIAGNOSIS — R2681 Unsteadiness on feet: Secondary | ICD-10-CM

## 2023-03-25 DIAGNOSIS — M25662 Stiffness of left knee, not elsewhere classified: Secondary | ICD-10-CM

## 2023-03-25 DIAGNOSIS — M25562 Pain in left knee: Secondary | ICD-10-CM

## 2023-03-25 NOTE — Therapy (Signed)
OUTPATIENT PHYSICAL THERAPY TREATMENT   Patient Name: Randy Wilson MRN: [id number] DOB:[birth year], 57 y.o., male Today's Date: 03/25/2023  END OF SESSION:  PT End of Session - 03/25/23 1109     Visit Number 6    Number of Visits 20    Date for PT Re-Evaluation 05/08/23    Authorization Type [employer] $52 copay    PT Start Time 1103    PT Stop Time 1143    PT Time Calculation (min) 40 min    Activity Tolerance Patient limited by pain    Behavior During Therapy [facility] for tasks assessed/performed                Past Medical History:  Diagnosis Date   Crohn's disease of small intestine (HCC) 2017   followed by dr h. danis;  s/p colon resection 11-02-2015,  on humira   History of asthma    child   History of colon polyps    History of COVID-19 05/30/2020   positive result in epicrunny nose/cough x 3 weeks all symptoms resolved   History of gout    last flare up 1 year ago per pt on 04-03-2021   OA (osteoarthritis)    Right wrist fracture    fell off ladder per pt on 02-08-2021   Right wrist fracture, closed, initial encounter 01/22/2021   Past Surgical History:  Procedure Laterality Date   ANKLE SURGERY Left 2002   bone spurs removed   COLONOSCOPY     last one 02-25-2020  by dr h. danis   HARDWARE REMOVAL Right 04/06/2021   Procedure: Right wrist removal of hardware;  Surgeon: [name], MD;  Location: [facility] [place];  Service: Orthopedics;  Laterality: Right;  with local anesthesia Needs 30 minutes   LAPAROSCOPIC ILEOCECECTOMY N/A 11/02/2015   Procedure: LAPAROSCOPIC ILEOCECECTOMY;  Surgeon: [name], MD;  Location: [facility];  Service: General;  Laterality: N/A;   ORIF WRIST FRACTURE Right 02/09/2021   Procedure: OPEN REDUCTION INTERNAL FIXATION (ORIF) WRIST FRACTURE;  Surgeon: [name], MD;  Location: [facility];  Service: Orthopedics;  Laterality: Right;  WITH REGIONAL   ROTATOR CUFF REPAIR Bilateral    SHOULDER ARTHROSCOPY W/  SUBACROMIAL DECOMPRESSION AND DISTAL CLAVICLE EXCISION Left 05/07/2008   @[facility]  by dr [name];   bone spurs removed   TOTAL KNEE ARTHROPLASTY Left 02/22/2023   Procedure: LEFT TOTAL KNEE ARTHROPLASTY;  Surgeon: [name], MD;  Location: [facility];  Service: Orthopedics;  Laterality: Left;   Patient Active Problem List   Diagnosis Date Noted   Status post total left knee replacement 02/22/2023   Hyperpigmented skin lesion 10/09/2022   Acute pain of left knee 08/01/2022   Dyslipidemia 03/08/2022   Crohn's disease of small intestine (HCC) 03/01/2022   Residual hemorrhoidal skin tags 03/31/2020   Prediabetes 01/20/2020   Erectile dysfunction 01/20/2020   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   Asthma 04/24/2015   Hx of colonic polyp 11/08/2014   History of colonic polyps 09/17/2014    PCP: Georgina Quint, MD  REFERRING PROVIDER: [name]*  REFERRING DIAG: H08.657 (ICD-10-CM) - Status post total left knee replacement  Rationale for Evaluation and Treatment: Rehabilitation  THERAPY DIAG:  Acute pain of left knee  Stiffness of left knee, not elsewhere classified  Muscle weakness (generalized)  Other abnormalities of gait and mobility  Unsteadiness on feet  ONSET DATE: DOS: 02/22/23   SUBJECTIVE:  SUBJECTIVE STATEMENT:  Knee is staying stiff and swollen today, it tends to get swollen especially after exercise  PERTINENT HISTORY:  Crohn's disease, asthma, OA, hx Rt wrist fx 2022, bil RTC repair  PAIN:   NPRS scale: 5/10  Pain location: Lt knee on sides and back  Pain description: aching Aggravating factors: sitting, lying, walking too long, bending the knee Relieving factors: medication, ice, repositioning, muscle relaxer   PRECAUTIONS:  None  RED  FLAGS: None   WEIGHT BEARING RESTRICTIONS:  No  FALLS:  Has patient fallen in last 6 months? 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 device utilized: Single point cane Level of assistance: Modified independence Comments: decr stance on Lt; decr hip/knee flexion on Lt   EDEMA: 03/13/23 Knee Joint Line: Rt: 39.2 cm  / Lt: 46 cm   LOWER EXTREMITY ROM:     ROM Right 03/13/2023 Left 03/13/2023 Left 03/19/23 Left 03/25/23  Knee flexion  A: 45 P: 50 (54 sitting with percussive device) A: 65 P: 73 AAROM seated edge of mat table 67*   Knee extension A: 0 A: -19 (seated LAQ) P: -4 A: -10 P: -4 A: 10* seated heel propped on stool    (Blank rows = not tested)   LOWER EXTREMITY MMT:    03/13/23: not formally tested; grossly 3-/5  MMT Right eval Left eval  Knee flexion    Knee extension     (Blank rows = not tested)                 Treatment:                                                DATE:    03/25/23  TherEx  Scifit bike seat 15 partial rotations with holds for flexion stretches x10 minutes Self knee flexion stretch- L foot flat on floor, scooting forward and using body weight to try to bend knee 12x5 second holds  Body weight knee flexion squat stretch in // bars x12 Knee flexion stretch 6 inch box 6x5 second holds    Manual  Tennis ball massage mixed with knee flexion AAROM  overpressure to tolerance       03/21/23 Therapeutic Exercise: Half revolutions on precor recumbent bike x 5 min with massage gun for quad deactivation Sitting Knee flexion self stretch scooting forward 3x3x10 sec (able to obtain 82 deg max flexion) Heel slide 2x10 Sit<>stand 2x10 Hamstring stretch 2x30 sec Quad set 2x10x3 sec Standing Terminal knee ext green TB 2x10x3 sec Gait Training: Ambulating with no a/d 2x100', cues for heel strike, hip/trunk extension during stance, increasing knee flexion during swing Modalities: Vaso 10 mins Lt knee medium compression 34 deg in elevation   Treatment:  DATE: 03/19/23 Therapeutic Exercise: Half revolutions on precor recumbent bike x 5 min with massage gun for quad deactivation Sitting Knee flexion self stretch scooting forward 3x30 sec Hamstring stretch 2x30 sec Quad set 2x10x3 sec; with PT overpressure 3x10 sec Supine SLR 2x10 SAQ 2x10 Prone Quad stretch with strap 2x30" Quad stretch contract/relax 5x5" Hamstring curl 2x10 with self overpressure Manual Therapy: Lt knee mobilization/over pressure for flexion and ext Modalities: Vaso 10 mins Lt knee medium compression 34 deg in elevation     TREATMENT:                                                      DATE: 03/15/2023 Therex Nustep ROM UE/LE 8 mins lvl 6 with flexion hold focus  Incline gastroc stretch 30 sec x 3 bilaterally Seated Lt knee AROM 1-2 seconds 2 x 15 end range 2.5 lbs  Seated Lt knee AAROM with Rt leg overpressure 15 sec x 5    TherActivity (to promote improved WB acceptance for transfers, ambulation and stair navigation improvements)  Leg press in available range :  double leg  x 15 100 lbs ,   single leg Lt  50 lbs  x 15  Retro step x 15 bilateral with occasional HHA on bar  Manual Seated Lt knee flexion mobilization c movement IR/distraction with percussive device on Lt quad applied by patient during  ROM.  Vasopneumatic  10 mins Lt knee medium compression 34 deg in elevation   TREATMENT:                                                      DATE: 03/14/2023 Therex Nustep ROM UE/LE 8 mins lvl 5 Incline gastroc stretch 30 sec x 5 bilaterally Seated Lt knee AROM 1-2 seconds 2 x 15 end range Supine heel slide 5 sec hold AAROM c strap combo with quad set 5 sec hold x 10 each  Manual Seated Lt knee flexion mobilization c movement IR/distraction with percussive device on Lt quad applied by patient during ROM.   Vasopneumatic  10 mins Lt knee medium compression 34 deg in elevation   TREATMENT:                                                      DATE: 03/13/23 TherEx: See HEP - demonstrated exercises with trial reps performed PRN for comprehension; 5-10 reps of each exercise with mod cues   Manual Seated Lt knee PROM to tolerance, use of IASTM percussive device to Lt quad to help relax - improved passive knee flexion sitting following use of percussive device     PATIENT EDUCATION:  Education details: HEP Person educated: Patient Education method: Programmer, multimedia, Facilities manager, and Handouts Education comprehension: verbalized understanding, returned demonstration, and needs further education  HOME EXERCISE PROGRAM: Access Code: [email or web address] URL: [email or web address] Date: 03/21/2023 Prepared by: Vernon Prey April Kirstie Peri  Exercises - Long Sitting Quad Set  - 5-10 x daily - 7 x weekly - 1 sets - 5-10 reps - 5 sec  hold - Supine Heel Slide with Strap  - 5-10 x daily - 7 x weekly - 1 sets - 5-10 reps - 5 sec hold - Seated Knee Extension AROM  - 5-10 x daily - 7 x weekly - 1 sets - 5-10 reps - 3-5 sec hold - Prone Quadriceps Stretch with Strap  - 1 x daily - 7 x weekly - 2 sets - 30 sec hold - Prone Knee Flexion  - 1 x daily - 7 x weekly - 2 sets - 10 reps - Sit to Stand  - 1 x daily - 7 x weekly - 2 sets - 10 reps   ASSESSMENT:  CLINICAL IMPRESSION:  Pt arrives  today doing OK, still focused session on ROM, strength as appropriate, edema control. Unfortunately ROM is still limited, lost some flexion ROM andnly able to get to 67* today with AAROM flexion. Will continue efforts. Sees MD on 11/7, will continue with aggressive ROM and encouraged aggressive ice/elevation and ROM work at home as well.    GOALS: Goals reviewed with patient? Yes  SHORT TERM GOALS: Target date: 04/10/2023   Independent with initial HEP Goal status:  on going 03/14/2023  2.  Lt knee AROM 10-0-75 deg for improved mobility and function Goal status:  Met 03/21/23   LONG TERM GOALS: Target date: 05/08/2023  Independent with final HEP Goal status: INITIAL  2.  FOTO score improved to 62 Goal status: INITIAL  3.  Lt knee AROM improved to 5-0-95 for improved function and mobility Goal status: INIITAL  4.  Report pain < 2/10 with standing and walking activities for improved function Goal status: INITIAL  5.  Amb with LRAD modified independent without significant gait deviations for improved function Goal status: INITIAL   PLAN:  PT FREQUENCY:  3x/wk x 4 wks, then 2x/wk x 4 wks  PT DURATION: 8 weeks  PLANNED INTERVENTIONS: 97164- PT Re-evaluation, 97110-Therapeutic exercises, 97530- Therapeutic activity, [id number]- Neuromuscular re-education, 97535- Self Care, 95284- Manual therapy, [id number]- Gait training, [phone]- Aquatic Therapy, 97014- Electrical stimulation (unattended), 97016- Vasopneumatic device, Patient/Family education, Balance training, Stair training, Taping, Dry Needling, DME instructions, Cryotherapy, and Moist heat.  PLAN FOR NEXT SESSION: Manual intervention for mobility gains ( percussive device on quad recommended).   Early static balance/functional WB improvements. Continue aggressive ROM work as tolerated.    NEXT MD VISIT: 04/04/23  Nedra Hai, PT, DPT 03/25/23 11:44 AM

## 2023-03-26 ENCOUNTER — Encounter: Payer: BC Managed Care – PPO | Admitting: Physical Therapy

## 2023-03-27 ENCOUNTER — Ambulatory Visit: Payer: BC Managed Care – PPO | Admitting: Physical Therapy

## 2023-03-27 ENCOUNTER — Encounter: Payer: Self-pay | Admitting: Physical Therapy

## 2023-03-27 DIAGNOSIS — M6281 Muscle weakness (generalized): Secondary | ICD-10-CM | POA: Diagnosis not present

## 2023-03-27 DIAGNOSIS — M25662 Stiffness of left knee, not elsewhere classified: Secondary | ICD-10-CM

## 2023-03-27 DIAGNOSIS — M25562 Pain in left knee: Secondary | ICD-10-CM | POA: Diagnosis not present

## 2023-03-27 DIAGNOSIS — R2689 Other abnormalities of gait and mobility: Secondary | ICD-10-CM

## 2023-03-27 DIAGNOSIS — R600 Localized edema: Secondary | ICD-10-CM

## 2023-03-27 DIAGNOSIS — R2681 Unsteadiness on feet: Secondary | ICD-10-CM

## 2023-03-27 NOTE — Therapy (Signed)
OUTPATIENT PHYSICAL THERAPY TREATMENT   Patient Name: Randy Wilson MRN: [id number] DOB:18-Jun-1965, 57 y.o., male Today's Date: 03/27/2023  END OF SESSION:  PT End of Session - 03/27/23 0844     Visit Number 7    Number of Visits 20    Date for PT Re-Evaluation 05/08/23    Authorization Type BC[employer] $52 copay    PT Start Time 0845    PT Stop Time 0930    PT Time Calculation (min) 45 min    Activity Tolerance Patient limited by pain    Behavior During Therapy [facility] for tasks assessed/performed                 Past Medical History:  Diagnosis Date   Crohn's disease of small intestine (HCC) 2017   followed by dr h. danis;  s/p colon resection 11-02-2015,  on humira   History of asthma    child   History of colon polyps    History of COVID-19 05/30/2020   positive result in epicrunny nose/cough x 3 weeks all symptoms resolved   History of gout    last flare up 1 year ago per pt on 04-03-2021   OA (osteoarthritis)    Right wrist fracture    fell off ladder per pt on 02-08-2021   Right wrist fracture, closed, initial encounter 01/22/2021   Past Surgical History:  Procedure Laterality Date   ANKLE SURGERY Left 2002   bone spurs removed   COLONOSCOPY     last one 02-25-2020  by dr h. danis   HARDWARE REMOVAL Right 04/06/2021   Procedure: Right wrist removal of hardware;  Surgeon: [name], MD;  Location: [facility] [place];  Service: Orthopedics;  Laterality: Right;  with local anesthesia Needs 30 minutes   LAPAROSCOPIC ILEOCECECTOMY N/A 11/02/2015   Procedure: LAPAROSCOPIC ILEOCECECTOMY;  Surgeon: [name], MD;  Location: [facility];  Service: General;  Laterality: N/A;   ORIF WRIST FRACTURE Right 02/09/2021   Procedure: OPEN REDUCTION INTERNAL FIXATION (ORIF) WRIST FRACTURE;  Surgeon: [name], MD;  Location: [facility] [place];  Service: Orthopedics;  Laterality: Right;  WITH REGIONAL   ROTATOR CUFF REPAIR Bilateral    SHOULDER ARTHROSCOPY W/  SUBACROMIAL DECOMPRESSION AND DISTAL CLAVICLE EXCISION Left 05/07/2008   @[facility]  by dr [name];   bone spurs removed   TOTAL KNEE ARTHROPLASTY Left 02/22/2023   Procedure: LEFT TOTAL KNEE ARTHROPLASTY;  Surgeon: [name], MD;  Location: [facility];  Service: Orthopedics;  Laterality: Left;   Patient Active Problem List   Diagnosis Date Noted   Status post total left knee replacement 02/22/2023   Hyperpigmented skin lesion 10/09/2022   Acute pain of left knee 08/01/2022   Dyslipidemia 03/08/2022   Crohn's disease of small intestine (HCC) 03/01/2022   Residual hemorrhoidal skin tags 03/31/2020   Prediabetes 01/20/2020   Erectile dysfunction 01/20/2020   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   Asthma 04/24/2015   Hx of colonic polyp 11/08/2014   History of colonic polyps 09/17/2014    PCP: Georgina Quint, MD  REFERRING PROVIDER: [name]*  REFERRING DIAG: A21.308 (ICD-10-CM) - Status post total left knee replacement  Rationale for Evaluation and Treatment: Rehabilitation  THERAPY DIAG:  Acute pain of left knee  Stiffness of left knee, not elsewhere classified  Muscle weakness (generalized)  Other abnormalities of gait and mobility  Unsteadiness on feet  Localized edema  ONSET DATE: DOS: 02/22/23   SUBJECTIVE:  SUBJECTIVE STATEMENT: Has been working on motion; feels his knee swells every time he does exercises   PERTINENT HISTORY:  Crohn's disease, asthma, OA, hx Rt wrist fx 2022, bil RTC repair  PAIN:   NPRS scale: 5/10  Pain location: Lt knee on sides and back  Pain description: aching Aggravating factors: sitting, lying, walking too long, bending the knee Relieving factors: medication, ice, repositioning, muscle relaxer   PRECAUTIONS:   None  RED FLAGS: None   WEIGHT BEARING RESTRICTIONS:  No  FALLS:  Has patient fallen in last 6 months? 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 device utilized: Single point cane Level of assistance: Modified independence Comments: decr stance on Lt; decr hip/knee flexion on Lt   EDEMA: 03/13/23 Knee Joint Line: Rt: 39.2 cm  / Lt: 46 cm   LOWER EXTREMITY ROM:     ROM Right 03/13/2023 Left 03/13/2023 Left 03/19/23 Left 03/25/23 Left 03/27/23  Knee flexion  A: 45 P: 50 (54 sitting with percussive device) A: 65 P: 73 AAROM seated edge of mat table 67*  A: 79  (supine)  Knee extension A: 0 A: -19 (seated LAQ) P: -4 A: -10 P: -4 A: 10* seated heel propped on stool     (Blank rows = not tested)   LOWER EXTREMITY MMT:    03/13/23: not formally tested; grossly 3-/5  MMT Right eval Left eval  Knee flexion    Knee extension     (Blank rows = not tested)                 Treatment DATE 03/27/23 TherEx NuStep x 10 min; Seat 12/11 with flexion holds LLE on 8" step 30 sec flexion holds x 5 reps Seated LLE AA knee flexion with RLE providing overpressure 10x10 sec holds AA heel slides x 10 reps on Lt  Manual Seated Lt knee flexion PROM with contract relax to tolerance x 10 min  Modalities Vaso x 10 min; Lt knee; mod pressure 34 deg    03/25/23 TherEx Scifit bike seat 15 partial  rotations with holds for flexion stretches x10 minutes Self knee flexion stretch- L foot flat on floor, scooting forward and using body weight to try to bend knee 12x5 second holds  Body weight knee flexion squat stretch in // bars x12 Knee flexion stretch 6 inch box 6x5 second holds    Manual Tennis ball massage mixed with knee flexion AAROM overpressure to tolerance       03/21/23 Therapeutic Exercise: Half revolutions on precor recumbent bike x 5 min with massage gun for quad deactivation Sitting Knee flexion self stretch scooting forward 3x3x10 sec (able to obtain 82 deg max flexion) Heel slide 2x10 Sit<>stand 2x10 Hamstring stretch 2x30 sec Quad set 2x10x3 sec Standing Terminal knee ext green TB 2x10x3 sec Gait Training: Ambulating with no a/d 2x100', cues for heel strike, hip/trunk extension during stance, increasing knee flexion during swing Modalities: Vaso 10 mins  Lt knee medium compression 34 deg in elevation   03/19/23 Therapeutic Exercise: Half revolutions on precor recumbent bike x 5 min with massage gun for quad deactivation Sitting Knee flexion self stretch scooting forward 3x30 sec Hamstring stretch 2x30 sec Quad set 2x10x3 sec; with PT overpressure 3x10 sec Supine SLR 2x10 SAQ 2x10 Prone Quad stretch with strap 2x30" Quad stretch contract/relax 5x5" Hamstring curl 2x10 with self overpressure Manual Therapy: Lt knee mobilization/over pressure for flexion and ext Modalities: Vaso 10 mins Lt knee medium compression 34 deg in elevation      PATIENT EDUCATION:  Education details: HEP Person educated: Patient Education method: Programmer, multimedia, Facilities manager, and Handouts Education comprehension: verbalized understanding, returned demonstration, and needs further education  HOME EXERCISE PROGRAM: Access Code: [email or web address] URL: [email or web address] Date: 03/21/2023 Prepared by: Vernon Prey April Kirstie Peri  Exercises - Long Sitting Quad Set   - 5-10 x daily - 7 x weekly - 1 sets - 5-10 reps - 5 sec hold - Supine Heel Slide with Strap  - 5-10 x daily - 7 x weekly - 1 sets - 5-10 reps - 5 sec hold - Seated Knee Extension AROM  - 5-10 x daily - 7 x weekly - 1 sets - 5-10 reps - 3-5 sec hold - Prone Quadriceps Stretch with Strap  - 1 x daily - 7 x weekly - 2 sets - 30 sec hold - Prone Knee Flexion  - 1 x daily - 7 x weekly - 2 sets - 10 reps - Sit to Stand  - 1 x daily - 7 x weekly - 2 sets - 10 reps   ASSESSMENT:  CLINICAL IMPRESSION: Pt tolerated session well today getting nearly 80 deg flexion.  Will continue to benefit from PT to maximize function.   GOALS: Goals reviewed with patient? Yes  SHORT TERM GOALS: Target date: 04/10/2023   Independent with initial HEP Goal status:  on going 03/14/2023  2.  Lt knee AROM 10-0-75 deg for improved mobility and function Goal status:  Met 03/21/23   LONG TERM GOALS: Target date: 05/08/2023  Independent with final HEP Goal status: INITIAL  2.  FOTO score improved to 62 Goal status: INITIAL  3.  Lt knee AROM improved to 5-0-95 for improved function and mobility Goal status: INIITAL  4.  Report pain < 2/10 with standing and walking activities for improved function Goal status: INITIAL  5.  Amb with LRAD modified independent without significant gait deviations for improved function Goal status: INITIAL   PLAN:  PT FREQUENCY:  3x/wk x 4 wks, then 2x/wk x 4 wks  PT DURATION: 8 weeks  PLANNED INTERVENTIONS: 97164- PT Re-evaluation, 97110-Therapeutic exercises, 97530- Therapeutic activity, [id number]- Neuromuscular re-education, 97535- Self Care, 54098- Manual therapy, [id number]- Gait training, [phone]- Aquatic Therapy, 97014- Electrical stimulation (unattended), 97016- Vasopneumatic device, Patient/Family education, Balance training, Stair training, Taping, Dry Needling, DME instructions, Cryotherapy, and Moist heat.  PLAN FOR NEXT SESSION:  percussive device on quad recommended;  Early static balance/functional WB improvements. Continue aggressive ROM work as tolerated.    NEXT MD VISIT: 04/04/23  Clarita Crane, PT, DPT 03/27/23 10:54 AM

## 2023-03-29 ENCOUNTER — Encounter: Payer: BC Managed Care – PPO | Admitting: Physical Therapy

## 2023-04-01 ENCOUNTER — Ambulatory Visit (INDEPENDENT_AMBULATORY_CARE_PROVIDER_SITE_OTHER): Payer: BC Managed Care – PPO | Admitting: Rehabilitative and Restorative Service Providers"

## 2023-04-01 ENCOUNTER — Encounter: Payer: Self-pay | Admitting: Radiology

## 2023-04-01 ENCOUNTER — Encounter: Payer: Self-pay | Admitting: Rehabilitative and Restorative Service Providers"

## 2023-04-01 DIAGNOSIS — M25562 Pain in left knee: Secondary | ICD-10-CM | POA: Diagnosis not present

## 2023-04-01 DIAGNOSIS — M6281 Muscle weakness (generalized): Secondary | ICD-10-CM

## 2023-04-01 DIAGNOSIS — R2681 Unsteadiness on feet: Secondary | ICD-10-CM

## 2023-04-01 DIAGNOSIS — R2689 Other abnormalities of gait and mobility: Secondary | ICD-10-CM | POA: Diagnosis not present

## 2023-04-01 DIAGNOSIS — R600 Localized edema: Secondary | ICD-10-CM

## 2023-04-01 DIAGNOSIS — M25662 Stiffness of left knee, not elsewhere classified: Secondary | ICD-10-CM | POA: Diagnosis not present

## 2023-04-01 NOTE — Therapy (Signed)
OUTPATIENT PHYSICAL THERAPY TREATMENT   Patient Name: Randy Wilson MRN: [id number] DOB:Oct 31, 1965, 57 y.o., male Today's Date: 04/01/2023  END OF SESSION:  PT End of Session - 04/01/23 0849     Visit Number 8    Number of Visits 20    Date for PT Re-Evaluation 05/08/23    Authorization Type [employer] $52 copay    PT Start Time 0842    PT Stop Time 0932    PT Time Calculation (min) 50 min    Activity Tolerance Patient limited by pain    Behavior During Therapy [facility] for tasks assessed/performed                  Past Medical History:  Diagnosis Date   Crohn's disease of small intestine (HCC) 2017   followed by dr h. danis;  s/p colon resection 11-02-2015,  on humira   History of asthma    child   History of colon polyps    History of COVID-19 05/30/2020   positive result in epicrunny nose/cough x 3 weeks all symptoms resolved   History of gout    last flare up 1 year ago per pt on 04-03-2021   OA (osteoarthritis)    Right wrist fracture    fell off ladder per pt on 02-08-2021   Right wrist fracture, closed, initial encounter 01/22/2021   Past Surgical History:  Procedure Laterality Date   ANKLE SURGERY Left 2002   bone spurs removed   COLONOSCOPY     last one 02-25-2020  by dr h. danis   HARDWARE REMOVAL Right 04/06/2021   Procedure: Right wrist removal of hardware;  Surgeon: [name], MD;  Location: [facility] [place];  Service: Orthopedics;  Laterality: Right;  with local anesthesia Needs 30 minutes   LAPAROSCOPIC ILEOCECECTOMY N/A 11/02/2015   Procedure: LAPAROSCOPIC ILEOCECECTOMY;  Surgeon: [name], MD;  Location: [facility];  Service: General;  Laterality: N/A;   ORIF WRIST FRACTURE Right 02/09/2021   Procedure: OPEN REDUCTION INTERNAL FIXATION (ORIF) WRIST FRACTURE;  Surgeon: [name], MD;  Location: [facility] [place];  Service: Orthopedics;  Laterality: Right;  WITH REGIONAL   ROTATOR CUFF REPAIR Bilateral    SHOULDER ARTHROSCOPY W/  SUBACROMIAL DECOMPRESSION AND DISTAL CLAVICLE EXCISION Left 05/07/2008   @[facility]  by dr [name];   bone spurs removed   TOTAL KNEE ARTHROPLASTY Left 02/22/2023   Procedure: LEFT TOTAL KNEE ARTHROPLASTY;  Surgeon: [name], MD;  Location: [facility];  Service: Orthopedics;  Laterality: Left;   Patient Active Problem List   Diagnosis Date Noted   Status post total left knee replacement 02/22/2023   Hyperpigmented skin lesion 10/09/2022   Acute pain of left knee 08/01/2022   Dyslipidemia 03/08/2022   Crohn's disease of small intestine (HCC) 03/01/2022   Residual hemorrhoidal skin tags 03/31/2020   Prediabetes 01/20/2020   Erectile dysfunction 01/20/2020   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   Asthma 04/24/2015   Hx of colonic polyp 11/08/2014   History of colonic polyps 09/17/2014    PCP: Georgina Quint, MD  REFERRING PROVIDER: [name]*  REFERRING DIAG: W09.811 (ICD-10-CM) - Status post total left knee replacement  Rationale for Evaluation and Treatment: Rehabilitation  THERAPY DIAG:  Acute pain of left knee  Stiffness of left knee, not elsewhere classified  Muscle weakness (generalized)  Other abnormalities of gait and mobility  Unsteadiness on feet  Localized edema  ONSET DATE: DOS: 02/22/23   SUBJECTIVE:  SUBJECTIVE STATEMENT: Pt indicated feeling more swelling, symptoms at 2/10 today.  Reported increased walking and walking without cane.    PERTINENT HISTORY:  Crohn's disease, asthma, OA, hx Rt wrist fx 2022, bil RTC repair  PAIN:   NPRS scale: 2/10 Pain location: Lt knee on sides and back  Pain description: aching Aggravating factors: sitting, lying, walking too long, bending the knee Relieving factors: medication, ice, repositioning, muscle  relaxer   PRECAUTIONS:  None  RED FLAGS: None   WEIGHT BEARING RESTRICTIONS:  No  FALLS:  Has patient fallen in last 6 months? 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 device utilized: Single point cane Level of assistance: Modified independence Comments: decr stance on Lt; decr hip/knee flexion on Lt   EDEMA: 03/13/23 Knee Joint Line: Rt: 39.2 cm  / Lt: 46 cm   LOWER EXTREMITY ROM:     ROM Right 03/13/2023 Left 03/13/2023 Left 03/19/23 Left 03/25/23 Left 03/27/23  Knee flexion  A: 45 P: 50 (54 sitting with percussive device) A: 65 P: 73 AAROM seated edge of mat table 67*  A: 79  (supine)  Knee extension A: 0 A: -19 (seated LAQ) P: -4 A: -10 P: -4 A: 10* seated heel propped on stool     (Blank rows = not tested)   LOWER EXTREMITY MMT:    03/13/23: not formally tested; grossly 3-/5  MMT Right 04/01/2023 Left 04/01/2023  Knee flexion    Knee extension 5/5 95.6 lbs 4+/5 35.2, 39 lbs   (Blank rows = not tested)                  Treatment                                                                                               DATE:  04/01/2023 TherEx NuStep x 10 min; Seat 12/11 with flexion holds Seated Lt knee LAQ with end range pause in flexion and extension 2 x 15 5 lbs    TherActivity (to improve ambulation, stair navigation) Leg press double leg 112 lbs x 15, single  leg Lt 50 lbs 2 x 15 Step up forward with retro step off WB on Lt leg x 15 6 inch hurdle step over and back clearance with single hand on rail x 15 bilateral (performed to improve foot clearance in ambulation, single leg stance control)  Manual Seated Lt knee flexion c distraction/IR mobilization c movement c percussive device on on quad.   Modalities Vaso x 10 min; Lt knee; mod pressure 34 deg  Treatment  DATE:  03/27/23 TherEx NuStep x 10 min; Seat 12/11 with flexion holds LLE on 8" step 30 sec flexion holds x 5 reps Seated LLE AA knee flexion with RLE providing overpressure 10x10 sec holds AA heel slides x 10 reps on Lt  Manual Seated Lt knee flexion PROM with contract relax to tolerance x 10 min  Modalities Vaso x 10 min; Lt knee; mod pressure 34 deg    Treatment                                                                                               DATE: 03/25/23 TherEx Scifit bike seat 15 partial rotations with holds for flexion stretches x10 minutes Self knee flexion stretch- L foot flat on floor, scooting forward and using body weight to try to bend knee 12x5 second holds  Body weight knee flexion squat stretch in // bars x12 Knee flexion stretch 6 inch box 6x5 second holds    Manual Tennis ball massage mixed with knee flexion AAROM overpressure to tolerance       Treatment                                                                                               DATE: 03/21/23 Therapeutic Exercise: Half revolutions on precor recumbent bike x 5 min with massage gun for quad deactivation Sitting Knee flexion self stretch scooting forward 3x3x10 sec (able to obtain 82 deg max flexion) Heel slide 2x10 Sit<>stand 2x10 Hamstring stretch 2x30 sec Quad set 2x10x3 sec Standing Terminal knee ext green TB 2x10x3 sec Gait Training: Ambulating with no a/d 2x100', cues  for heel strike, hip/trunk extension during stance, increasing knee flexion during swing Modalities: Vaso 10 mins Lt knee medium compression 34 deg in elevation      PATIENT EDUCATION:  Education details: HEP Person educated: Patient Education method: Programmer, multimedia, Facilities manager, and Handouts Education comprehension: verbalized understanding, returned demonstration, and needs further education  HOME EXERCISE PROGRAM: Access Code: [email or web address] URL: [email or web address] Date: 03/21/2023 Prepared by: Vernon Prey April Kirstie Peri  Exercises - Long Sitting Quad Set  - 5-10 x daily - 7 x weekly - 1 sets - 5-10 reps - 5 sec hold - Supine Heel Slide with Strap  - 5-10 x daily - 7 x weekly - 1 sets - 5-10 reps - 5 sec hold - Seated Knee Extension AROM  - 5-10 x daily - 7 x weekly - 1 sets - 5-10 reps - 3-5 sec hold - Prone Quadriceps Stretch with Strap  - 1 x daily - 7 x weekly - 2 sets - 30 sec hold - Prone Knee Flexion  - 1 x daily - 7 x weekly - 2 sets - 10 reps - Sit to Stand  -  1 x daily - 7 x weekly - 2 sets - 10 reps   ASSESSMENT:  CLINICAL IMPRESSION: Strength dynamometry showed notable difference between LE but overall making gains in Lt leg quad.   Continued skilled PT services warranted at this time to continue to improve quality of knee mobility and strength for functional movement pattern improvements.   GOALS: Goals reviewed with patient? Yes  SHORT TERM GOALS: Target date: 04/10/2023   Independent with initial HEP Goal status:  Met 04/01/2023  2.  Lt knee AROM 10-0-75 deg for improved mobility and function Goal status:  Met 03/21/23   LONG TERM GOALS: Target date: 05/08/2023  Independent with final HEP Goal status: on going 04/01/2023  2.  FOTO score improved to 62 Goal status: on going 04/01/2023  3.  Lt knee AROM improved to 5-0-95 for improved function and mobility Goal status: on going 04/01/2023  4.  Report pain < 2/10 with standing and walking  activities for improved function Goal status: on going 04/01/2023  5.  Amb with LRAD modified independent without significant gait deviations for improved function Goal status: on going 04/01/2023   PLAN:  PT FREQUENCY:  3x/wk x 4 wks, then 2x/wk x 4 wks  PT DURATION: 8 weeks  PLANNED INTERVENTIONS: 97164- PT Re-evaluation, 97110-Therapeutic exercises, 97530- Therapeutic activity, 97112- Neuromuscular re-education, 97535- Self Care, 27062- Manual therapy, [id number]- Gait training, [phone]- Aquatic Therapy, 97014- Electrical stimulation (unattended), 97016- Vasopneumatic device, Patient/Family education, Balance training, Stair training, Taping, Dry Needling, DME instructions, Cryotherapy, and Moist heat.  PLAN FOR NEXT SESSION:  Continue knee flexion mobility gains, WB strengthening/balance improvements.    NEXT MD VISIT: 04/04/23  Chyrel Masson, PT, DPT, OCS, ATC 04/01/23  9:28 AM

## 2023-04-03 ENCOUNTER — Ambulatory Visit (INDEPENDENT_AMBULATORY_CARE_PROVIDER_SITE_OTHER): Payer: BC Managed Care – PPO | Admitting: Physical Therapy

## 2023-04-03 ENCOUNTER — Encounter: Payer: Self-pay | Admitting: Physical Therapy

## 2023-04-03 ENCOUNTER — Ambulatory Visit: Payer: BC Managed Care – PPO

## 2023-04-03 VITALS — BP 153/86 | HR 70 | Temp 97.7°F | Resp 18 | Ht 73.0 in | Wt 246.8 lb

## 2023-04-03 DIAGNOSIS — M25662 Stiffness of left knee, not elsewhere classified: Secondary | ICD-10-CM | POA: Diagnosis not present

## 2023-04-03 DIAGNOSIS — R2681 Unsteadiness on feet: Secondary | ICD-10-CM

## 2023-04-03 DIAGNOSIS — K5000 Crohn's disease of small intestine without complications: Secondary | ICD-10-CM | POA: Diagnosis not present

## 2023-04-03 DIAGNOSIS — M25562 Pain in left knee: Secondary | ICD-10-CM

## 2023-04-03 DIAGNOSIS — M6281 Muscle weakness (generalized): Secondary | ICD-10-CM

## 2023-04-03 DIAGNOSIS — R600 Localized edema: Secondary | ICD-10-CM | POA: Diagnosis not present

## 2023-04-03 DIAGNOSIS — R2689 Other abnormalities of gait and mobility: Secondary | ICD-10-CM

## 2023-04-03 MED ORDER — SODIUM CHLORIDE 0.9 % IV SOLN
600.0000 mg | Freq: Once | INTRAVENOUS | Status: AC
  Administered 2023-04-03: 600 mg via INTRAVENOUS
  Filled 2023-04-03: qty 10

## 2023-04-03 NOTE — Therapy (Signed)
OUTPATIENT PHYSICAL THERAPY TREATMENT   Patient Name: Randy KUMPF MRN: [id number] DOB:21-Aug-1965, 57 y.o., male Today's Date: 04/03/2023  END OF SESSION:  PT End of Session - 04/03/23 0928     Visit Number 9    Number of Visits 20    Date for PT Re-Evaluation 05/08/23    Authorization Type [employer] $52 copay    PT Start Time 0930    PT Stop Time 1009    PT Time Calculation (min) 39 min    Activity Tolerance Patient limited by pain    Behavior During Therapy [facility] for tasks assessed/performed                   Past Medical History:  Diagnosis Date   Crohn's disease of small intestine (HCC) 2017   followed by dr h. danis;  s/p colon resection 11-02-2015,  on humira   History of asthma    child   History of colon polyps    History of COVID-19 05/30/2020   positive result in epicrunny nose/cough x 3 weeks all symptoms resolved   History of gout    last flare up 1 year ago per pt on 04-03-2021   OA (osteoarthritis)    Right wrist fracture    fell off ladder per pt on 02-08-2021   Right wrist fracture, closed, initial encounter 01/22/2021   Past Surgical History:  Procedure Laterality Date   ANKLE SURGERY Left 2002   bone spurs removed   COLONOSCOPY     last one 02-25-2020  by dr h. danis   HARDWARE REMOVAL Right 04/06/2021   Procedure: Right wrist removal of hardware;  Surgeon: [name], MD;  Location: [facility];  Service: Orthopedics;  Laterality: Right;  with local anesthesia Needs 30 minutes   LAPAROSCOPIC ILEOCECECTOMY N/A 11/02/2015   Procedure: LAPAROSCOPIC ILEOCECECTOMY;  Surgeon: [name], MD;  Location: [facility];  Service: General;  Laterality: N/A;   ORIF WRIST FRACTURE Right 02/09/2021   Procedure: OPEN REDUCTION INTERNAL FIXATION (ORIF) WRIST FRACTURE;  Surgeon: [name], MD;  Location: [facility];  Service: Orthopedics;  Laterality: Right;  WITH REGIONAL   ROTATOR CUFF REPAIR Bilateral    SHOULDER ARTHROSCOPY  W/ SUBACROMIAL DECOMPRESSION AND DISTAL CLAVICLE EXCISION Left 05/07/2008   @[facility]  by dr [name];   bone spurs removed   TOTAL KNEE ARTHROPLASTY Left 02/22/2023   Procedure: LEFT TOTAL KNEE ARTHROPLASTY;  Surgeon: [name], MD;  Location: [facility];  Service: Orthopedics;  Laterality: Left;   Patient Active Problem List   Diagnosis Date Noted   Status post total left knee replacement 02/22/2023   Hyperpigmented skin lesion 10/09/2022   Acute pain of left knee 08/01/2022   Dyslipidemia 03/08/2022   Crohn's disease of small intestine (HCC) 03/01/2022   Residual hemorrhoidal skin tags 03/31/2020   Prediabetes 01/20/2020   Erectile dysfunction 01/20/2020   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   Asthma 04/24/2015   Hx of colonic polyp 11/08/2014   History of colonic polyps 09/17/2014    PCP: Georgina Quint, MD  REFERRING PROVIDER: [name]*  REFERRING DIAG: Y86.578 (ICD-10-CM) - Status post total left knee replacement  Rationale for Evaluation and Treatment: Rehabilitation  THERAPY DIAG:  Localized edema  Acute pain of left knee  Stiffness of left knee, not elsewhere classified  Muscle weakness (generalized)  Other abnormalities of gait and mobility  Unsteadiness on feet  ONSET DATE: DOS: 02/22/23   SUBJECTIVE:  SUBJECTIVE STATEMENT: Still having tightness in knee; knee is still stiff    PERTINENT HISTORY:  Crohn's disease, asthma, OA, hx Rt wrist fx 2022, bil RTC repair  PAIN:   NPRS scale: 2/10 Pain location: Lt knee on sides and back  Pain description: aching Aggravating factors: sitting, lying, walking too long, bending the knee Relieving factors: medication, ice, repositioning, muscle relaxer   PRECAUTIONS:  None  RED  FLAGS: None   WEIGHT BEARING RESTRICTIONS:  No  FALLS:  Has patient fallen in last 6 months? 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  PLOF:  Independent and Leisure: bowling  PATIENT GOALS:  Return to bowling, be back to normal   OBJECTIVE:   PATIENT SURVEYS:  03/13/23 FOTO 33 (predicted 62)  COGNITIVE STATUS: Within functional limits for tasks assessed   SENSATION: WFL  GAIT: 03/13/23 Distance walked: 100' within clinc Assistive device utilized: Single point cane Level of assistance: Modified independence Comments: decr stance on Lt; decr hip/knee flexion on Lt   EDEMA: 03/13/23 Knee Joint Line: Rt: 39.2 cm  / Lt: 46 cm   LOWER EXTREMITY ROM:     ROM Right 03/13/2023 Left 03/13/2023 Left 03/19/23 Left  03/25/23 Left 03/27/23 Left 04/03/23  Knee flexion  A: 45 P: 50 (54 sitting with percussive device) A: 65 P: 73 AAROM seated edge of mat table 67*  A: 79  (supine) A: 82 P: 84  Knee extension A: 0 A: -19 (seated LAQ) P: -4 A: -10 P: -4 A: 10* seated heel propped on stool   A: -9 (seated LAQ) P: -2   (Blank rows = not tested)   LOWER EXTREMITY MMT:    03/13/23: not formally tested; grossly 3-/5  MMT Right 04/01/2023 Left 04/01/2023  Knee flexion    Knee extension 5/5 95.6 lbs 4+/5 35.2, 39 lbs   (Blank rows = not tested)                  Treatment 04/03/23 TherEx NuStep x 10 min; Seat 12/11 with flexion holds AA LAQ on Lt for extension motion x 10 reps AA heel slides x 10 reps with strap ROM measurements - see above for details Leg press 112# bil 3x10; LLE only 50# 3x10 LLLE on 8" step flexion holds 10 x 10 sec hold Forward step ups onto 8" step x 10 bil; light UE  support Lateral step ups onto 8" step x 10 bil; light UE support   04/01/2023 TherEx NuStep x 10 min; Seat 12/11 with flexion holds Seated Lt knee LAQ with end range pause in flexion and extension 2 x 15 5 lbs    TherActivity (to improve ambulation, stair navigation) Leg press double leg 112 lbs x 15, single leg Lt 50 lbs 2 x 15 Step up forward with retro step off WB on Lt leg x 15 6 inch hurdle step over and back clearance with single hand on rail x 15 bilateral (performed to improve foot clearance in ambulation, single leg stance control)  Manual Seated Lt knee flexion c distraction/IR mobilization c movement c percussive device on on quad.   Modalities Vaso x 10 min; Lt knee; mod pressure 34 deg  03/27/23 TherEx NuStep x 10 min; Seat 12/11 with flexion holds  LLE on 8" step 30 sec flexion holds x 5 reps Seated LLE AA knee flexion with RLE providing overpressure 10x10 sec holds AA heel slides x 10 reps on Lt  Manual Seated Lt knee flexion PROM with contract relax to tolerance x 10 min  Modalities Vaso x 10 min; Lt knee; mod pressure 34 deg    03/25/23 TherEx Scifit bike seat 15 partial rotations with holds for flexion stretches x10 minutes Self knee flexion stretch- L foot flat on floor, scooting forward and using body weight to try to bend knee 12x5 second holds  Body weight knee flexion squat stretch in // bars x12 Knee flexion stretch 6 inch box 6x5 second holds    Manual Tennis ball massage mixed with knee flexion AAROM overpressure to tolerance    PATIENT EDUCATION:  Education details: HEP Person educated: Patient Education method: Programmer, multimedia, Facilities manager, and Handouts Education comprehension: verbalized understanding, returned demonstration, and needs further education  HOME EXERCISE PROGRAM: Access Code: [email or web address] URL: [email or web address] Date: 03/21/2023 Prepared by: Vernon Prey April Kirstie Peri  Exercises - Long Sitting Quad Set  -  5-10 x daily - 7 x weekly - 1 sets - 5-10 reps - 5 sec hold - Supine Heel Slide with Strap  - 5-10 x daily - 7 x weekly - 1 sets - 5-10 reps - 5 sec hold - Seated Knee Extension AROM  - 5-10 x daily - 7 x weekly - 1 sets - 5-10 reps - 3-5 sec hold - Prone Quadriceps Stretch with Strap  - 1 x daily - 7 x weekly - 2 sets - 30 sec hold - Prone Knee Flexion  - 1 x daily - 7 x weekly - 2 sets - 10 reps - Sit to Stand  - 1 x daily - 7 x weekly - 2 sets - 10 reps   ASSESSMENT:  CLINICAL IMPRESSION: Pt continues to have limited ROM although slight improvements have been made.  Will see MD tomorrow, and will follow up after visit to determine if MD wants to do MUA.  Will continue to benefit from PT to maximize function.   GOALS: Goals reviewed with patient? Yes  SHORT TERM GOALS: Target date: 04/10/2023   Independent with initial HEP Goal status:  Met 04/01/2023  2.  Lt knee AROM 10-0-75 deg for improved mobility and function Goal status:  Met 03/21/23   LONG TERM GOALS: Target date: 05/08/2023  Independent with final HEP Goal status: on going 04/01/2023  2.  FOTO score improved to 62 Goal status: on going 04/01/2023  3.  Lt knee AROM improved to 5-0-95 for improved function and mobility Goal status: on going 04/01/2023  4.  Report pain < 2/10 with standing and walking activities for improved function Goal status: on going 04/01/2023  5.  Amb with LRAD modified independent without significant gait deviations for improved function Goal status: on going 04/01/2023   PLAN:  PT FREQUENCY:  3x/wk x 4 wks, then 2x/wk x 4 wks  PT DURATION: 8 weeks  PLANNED INTERVENTIONS: 97164- PT Re-evaluation, 97110-Therapeutic exercises, 97530- Therapeutic activity, 97112- Neuromuscular re-education, 97535- Self Care, 54098- Manual therapy, [id number]- Gait training, [phone]- Aquatic Therapy, 97014- Electrical stimulation (unattended), 97016- Vasopneumatic device, Patient/Family education, Balance training,  Stair training, Taping, Dry Needling, DME instructions, Cryotherapy, and Moist heat.  PLAN FOR NEXT SESSION: what did MD say? Continue knee flexion mobility gains, WB strengthening/balance improvements.    NEXT MD VISIT: 04/04/23    Clarita Crane, PT, DPT 04/03/23 10:11  AM

## 2023-04-03 NOTE — Patient Instructions (Signed)
Risankizumab Injection What is this medication? RISANKIZUMAB (RIS an KIZ ue mab) treats autoimmune conditions, such as psoriasis, arthritis, and Crohn's disease. It works by slowing down an overactive immune system. It is a monoclonal antibody. This medicine may be used for other purposes; ask your health care provider or pharmacist if you have questions. COMMON BRAND NAME(S): Skyrizi What should I tell my care team before I take this medication? They need to know if you have any of these conditions: Hepatic disease Immune system problems Infection, such as tuberculosis (TB), bacterial, fungal or viral infections Recent or upcoming vaccine An unusual or allergic reaction to risankizumab, other medications, foods, dyes, or preservatives Pregnant or trying to get pregnant Breast-feeding How should I use this medication? This medication is injected into a vein or under the skin. It is given by your care team in a hospital or clinic setting. It may also be given at home. If you get this medication at home, you will be taught how to prepare and give it. Use exactly as directed. Take it as directed on the prescription label. Keep taking it unless your care team tells you to stop. If you use a pen, be sure to take off the outer needle cover before using the dose. It is important that you put your used needles and syringes in a special sharps container. Do not put them in a trash can. If you do not have a sharps container, call your pharmacist or care team to get one. A special MedGuide will be given to you by the pharmacist with each prescription and refill. Be sure to read this information carefully each time. This medication comes with INSTRUCTIONS FOR USE. Ask your pharmacist for directions on how to use this medication. Read the information carefully. Talk to your pharmacist or care team if you have questions. Talk to your care team about the use of this medication in children. Special care may be  needed. Overdosage: If you think you have taken too much of this medicine contact a poison control center or emergency room at once. NOTE: This medicine is only for you. Do not share this medicine with others. What if I miss a dose? It is important not to miss any doses. Talk to your care team about what to do if you miss a dose. What may interact with this medication? Do not take this medication with any of the following: Live vaccines This list may not describe all possible interactions. Give your health care provider a list of all the medicines, herbs, non-prescription drugs, or dietary supplements you use. Also tell them if you smoke, drink alcohol, or use illegal drugs. Some items may interact with your medicine. What should I watch for while using this medication? Visit your care team for regular checks on your progress. Tell your care team if your symptoms do not start to get better or if they get worse. You will be tested for tuberculosis (TB) before you start this medication. If your care team prescribes any medication for TB, you should start taking the TB medication before starting this medication. Make sure to finish the full course of TB medication. This medication may increase your risk of getting an infection. Call your care team for advice if you get a fever, chills, sore throat, or other symptoms of a cold or flu. Do not treat yourself. Try to avoid being around people who are sick. This medication can decrease the response to a vaccine. If you need to get  vaccinated, tell your care team if you have received this medication. Extra booster doses may be needed. Talk to your care team to see if a different vaccination schedule is needed. What side effects may I notice from receiving this medication? Side effects that you should report to your care team as soon as possible: Allergic reactions--skin rash, itching, hives, swelling of the face, lips, tongue, or throat Infection--fever,  chills, cough, sore throat, wounds that don't heal, pain or trouble when passing urine, general feeling of discomfort or being unwell Liver injury--right upper belly pain, loss of appetite, nausea, light-colored stool, dark yellow or brown urine, yellowing skin or eyes, unusual weakness or fatigue Side effects that usually do not require medical attention (report to your care team if they continue or are bothersome): Fatigue Headache Pain, redness, or irritation at injection site Runny or stuffy nose Sore throat This list may not describe all possible side effects. Call your doctor for medical advice about side effects. You may report side effects to FDA at 1-800-FDA-1088. Where should I keep my medication? Keep out of the reach of children and pets. Store in a refrigerator. Do not freeze. Protect from light. Keep it in the original carton until you are ready to take it. See product for storage information. Each product may have different instructions. Remove the dose from the carton about 30 to 45 minutes before it is time for you to take it. Get rid of any unused medication after the expiration date. To get rid of medications that are no longer needed or have expired: Take the medication to a medication take-back program. Check with your pharmacy or law enforcement to find a location. If you cannot return the medication, ask your pharmacist or care team how to get rid of this medication safely. NOTE: This sheet is a summary. It may not cover all possible information. If you have questions about this medicine, talk to your doctor, pharmacist, or health care provider.  2024 Elsevier/Gold Standard (2021-08-02 00:00:00)

## 2023-04-03 NOTE — Progress Notes (Signed)
Diagnosis: Crohn's Disease  Provider:  Chilton Greathouse MD  Procedure: IV Infusion  IV Type: Peripheral, IV Location: L Hand  Skyrizi (risankizumab-rzaa), Dose: 600 mg  Infusion Start Time: 1320  Infusion Stop Time: 1428  Post Infusion IV Care: Observation period completed and Peripheral IV Discontinued  Discharge: Condition: Good, Destination: Home . AVS Provided  Performed by:  Adriana Mccallum, RN

## 2023-04-04 ENCOUNTER — Encounter: Payer: Self-pay | Admitting: Orthopaedic Surgery

## 2023-04-04 ENCOUNTER — Ambulatory Visit: Payer: BC Managed Care – PPO | Admitting: Orthopaedic Surgery

## 2023-04-04 DIAGNOSIS — M24662 Ankylosis, left knee: Secondary | ICD-10-CM

## 2023-04-04 DIAGNOSIS — Z96652 Presence of left artificial knee joint: Secondary | ICD-10-CM

## 2023-04-04 NOTE — Progress Notes (Signed)
The patient is a 57 year old gentleman who is now almost 6 weeks status post a left total knee arthroplasty secondary to severe arthritis.  He has been compliant with going to physical therapy but his range of motion of his left knee is quite limited.  On my exam today his extension is almost full but I could not even flex him to even 80 degrees.  He is walking with a limp and has been significantly painful for him.  Had a long and thorough discussion about recommending a manipulation of his left knee under anesthesia.  I explained what the surgery involves as well as the risk and benefits of surgery.  We would need him in aggressive physical therapy even the day of the manipulation and he understands this as well.  I described in detail what the procedure is like and let him know that even after this he would have to push himself hard through range of motion.  Potentially we could see if there is a CPM that could be provided after surgery as well.  Will work on scheduling his manipulation under anesthesia.  I would then see him back in around 2 weeks postoperative.  By then I would like a standing AP and lateral of his left operative knee.

## 2023-04-05 ENCOUNTER — Encounter: Payer: BC Managed Care – PPO | Admitting: Physical Therapy

## 2023-04-08 ENCOUNTER — Encounter (HOSPITAL_BASED_OUTPATIENT_CLINIC_OR_DEPARTMENT_OTHER): Payer: Self-pay | Admitting: Orthopaedic Surgery

## 2023-04-08 ENCOUNTER — Encounter: Payer: BC Managed Care – PPO | Admitting: Physical Therapy

## 2023-04-08 ENCOUNTER — Other Ambulatory Visit: Payer: Self-pay

## 2023-04-08 NOTE — Progress Notes (Signed)
Spoke w/ via phone for pre-op interview---pt Lab needs dos---- none, surgery orders req dr Magnus Ivan epic ib        Lab results------none COVID test -----patient states asymptomatic no test needed Arrive at -------700 04-11-2023 NPO after MN NO Solid Food.  Clear liquids from MN until---600 Med rec completed Medications to take morning of surgery -----none Diabetic medication -----n/a Patient instructed no nail polish to be worn day of surgery Patient instructed to bring photo id and insurance card day of surgery Patient aware to have Driver (ride ) / caregiver    for 24 hours after surgery  Randy Wilson wife-  Patient Special Instructions -----none Pre-Op special Instructions -----none Patient verbalized understanding of instructions that were given at this phone interview. Patient denies chest pain, sob, fever, cough at the interview.

## 2023-04-10 ENCOUNTER — Encounter: Payer: BC Managed Care – PPO | Admitting: Physical Therapy

## 2023-04-10 DIAGNOSIS — M24662 Ankylosis, left knee: Secondary | ICD-10-CM | POA: Insufficient documentation

## 2023-04-10 NOTE — H&P (Signed)
Randy Wilson is an 57 y.o. male.   Chief Complaint: Decreased range of motion left knee HPI: The patient is a 57 year old gentleman who underwent a left total knee replacement on September 27 of this year secondary to severe left knee arthritis.  He has pushed himself hard through physical therapy but unfortunately has been limited in flexion of that left knee secondary to pain.  At this point with his left knee flexion not even being to 90 degrees, we have recommended a manipulation under anesthesia of the left knee.  Past Medical History:  Diagnosis Date   Crohn's disease of small intestine (HCC) 2017   followed by dr h. danis;  s/p colon resection 11-02-2015,  on humira   History of asthma    child   History of colon polyps    History of COVID-19 05/30/2020   positive result in epicrunny nose/cough x 3 weeks all symptoms resolved   History of gout    last flare up 1 year ago per pt on 04-03-2021   OA (osteoarthritis)    Right wrist fracture    fell off ladder per pt on 02-08-2021   Right wrist fracture, closed, initial encounter 01/22/2021    Past Surgical History:  Procedure Laterality Date   ANKLE SURGERY Left 2002   bone spurs removed   COLONOSCOPY     last one 02-25-2020  by dr h. danis   HARDWARE REMOVAL Right 04/06/2021   Procedure: Right wrist removal of hardware;  Surgeon: [name], MD;  Location: [facility];  Service: Orthopedics;  Laterality: Right;  with local anesthesia Needs 30 minutes   LAPAROSCOPIC ILEOCECECTOMY N/A 11/02/2015   Procedure: LAPAROSCOPIC ILEOCECECTOMY;  Surgeon: [name], MD;  Location: [facility];  Service: General;  Laterality: N/A;   ORIF WRIST FRACTURE Right 02/09/2021   Procedure: OPEN REDUCTION INTERNAL FIXATION (ORIF) WRIST FRACTURE;  Surgeon: [name], MD;  Location[facility];  Service: Orthopedics;  Laterality: Right;  WITH REGIONAL   ROTATOR CUFF REPAIR Bilateral    SHOULDER ARTHROSCOPY W/  SUBACROMIAL DECOMPRESSION AND DISTAL CLAVICLE EXCISION Left 05/07/2008   @[facility]  by dr [name];   bone spurs removed   TOTAL KNEE ARTHROPLASTY Left 02/22/2023   Procedure: LEFT TOTAL KNEE ARTHROPLASTY;  Surgeon: [name], MD;  Location: [facility];  Service: Orthopedics;  Laterality: Left;    Family History  Problem Relation Age of Onset   Asthma Mother 13       asthma attack    Asthma Sister    Stomach cancer Sister    Stroke Brother        Myocardial infarction   Stroke Brother    Colon cancer Neg Hx    Rectal cancer Neg Hx    Esophageal cancer Neg Hx    Colon polyps Neg Hx    Allergic rhinitis Neg Hx    Angioedema Neg Hx    Eczema Neg Hx    Urticaria Neg Hx    Liver cancer Neg Hx    Pancreatic cancer Neg Hx    Social History:  reports that he quit smoking about 17 years ago. His smoking use included cigarettes. He started smoking about 42 years ago. He has a 25 pack-year smoking history. He has never used smokeless tobacco. He reports current alcohol use of about 6.0 standard drinks of alcohol per week. He reports that he does not use drugs.  Allergies:  Allergies  Allergen Reactions   Tramadol Itching  Shrimp [Shellfish Allergy] Hives    No medications prior to admission.    No results found for this or any previous visit (from the past 48 hour(s)). No results found.  Review of Systems  There were no vitals taken for this visit. Physical Exam Vitals reviewed.  Constitutional:      Appearance: Normal appearance. He is normal weight.  HENT:     Head: Normocephalic and atraumatic.  Eyes:     Extraocular Movements: Extraocular movements intact.     Pupils: Pupils are equal, round, and reactive to light.  Cardiovascular:     Rate and Rhythm: Normal rate and regular rhythm.     Pulses: Normal pulses.  Pulmonary:     Effort: Pulmonary effort is normal.     Breath sounds: Normal breath sounds.  Abdominal:     Palpations: Abdomen is soft.   Musculoskeletal:     Cervical back: Normal range of motion and neck supple.     Left knee: Decreased range of motion.  Neurological:     Mental Status: He is alert and oriented to person, place, and time.  Psychiatric:        Behavior: Behavior normal.      Assessment/Plan Postoperative arthrofibrosis and pain status post a left total knee arthroplasty  The patient understands that we are proceeding to surgery as an outpatient for a left knee manipulation under anesthesia.  The risk and benefits of the surgery have been explained in detail and the rationale behind proceeding with the manipulation.  He understands that physical therapy will need to be continued aggressively following this procedure in order to try to obtain good range of motion of his left knee.  [name], MD 04/10/2023, 9:59 PM

## 2023-04-11 ENCOUNTER — Ambulatory Visit (HOSPITAL_BASED_OUTPATIENT_CLINIC_OR_DEPARTMENT_OTHER): Payer: BC Managed Care – PPO | Admitting: Certified Registered Nurse Anesthetist

## 2023-04-11 ENCOUNTER — Encounter (HOSPITAL_BASED_OUTPATIENT_CLINIC_OR_DEPARTMENT_OTHER)
Admission: RE | Disposition: A | Discharge status: Home / Self Care | Payer: Self-pay | Source: Home / Self Care | Attending: Orthopaedic Surgery

## 2023-04-11 ENCOUNTER — Encounter: Payer: Self-pay | Admitting: Physical Therapy

## 2023-04-11 ENCOUNTER — Encounter (HOSPITAL_BASED_OUTPATIENT_CLINIC_OR_DEPARTMENT_OTHER): Payer: Self-pay | Admitting: Orthopaedic Surgery

## 2023-04-11 ENCOUNTER — Ambulatory Visit: Payer: BC Managed Care – PPO | Admitting: Physical Therapy

## 2023-04-11 ENCOUNTER — Telehealth: Payer: Self-pay | Admitting: Orthopaedic Surgery

## 2023-04-11 ENCOUNTER — Ambulatory Visit (HOSPITAL_BASED_OUTPATIENT_CLINIC_OR_DEPARTMENT_OTHER)
Admission: RE | Admit: 2023-04-11 | Discharge: 2023-04-11 | Disposition: A | Discharge status: Home / Self Care | Payer: BC Managed Care – PPO | Attending: Orthopaedic Surgery | Admitting: Orthopaedic Surgery

## 2023-04-11 ENCOUNTER — Other Ambulatory Visit: Payer: Self-pay

## 2023-04-11 DIAGNOSIS — M6281 Muscle weakness (generalized): Secondary | ICD-10-CM

## 2023-04-11 DIAGNOSIS — Z87891 Personal history of nicotine dependence: Secondary | ICD-10-CM | POA: Insufficient documentation

## 2023-04-11 DIAGNOSIS — M1712 Unilateral primary osteoarthritis, left knee: Secondary | ICD-10-CM | POA: Insufficient documentation

## 2023-04-11 DIAGNOSIS — M25562 Pain in left knee: Secondary | ICD-10-CM | POA: Diagnosis not present

## 2023-04-11 DIAGNOSIS — J45909 Unspecified asthma, uncomplicated: Secondary | ICD-10-CM | POA: Diagnosis not present

## 2023-04-11 DIAGNOSIS — Z96652 Presence of left artificial knee joint: Secondary | ICD-10-CM | POA: Diagnosis not present

## 2023-04-11 DIAGNOSIS — R2689 Other abnormalities of gait and mobility: Secondary | ICD-10-CM

## 2023-04-11 DIAGNOSIS — M25662 Stiffness of left knee, not elsewhere classified: Secondary | ICD-10-CM

## 2023-04-11 DIAGNOSIS — R600 Localized edema: Secondary | ICD-10-CM

## 2023-04-11 DIAGNOSIS — M24662 Ankylosis, left knee: Secondary | ICD-10-CM | POA: Diagnosis present

## 2023-04-11 DIAGNOSIS — Z01818 Encounter for other preprocedural examination: Secondary | ICD-10-CM

## 2023-04-11 DIAGNOSIS — R2681 Unsteadiness on feet: Secondary | ICD-10-CM

## 2023-04-11 HISTORY — PX: KNEE CLOSED REDUCTION: SHX995

## 2023-04-11 SURGERY — MANIPULATION, KNEE, CLOSED
Anesthesia: Monitor Anesthesia Care | Site: Knee | Laterality: Left

## 2023-04-11 MED ORDER — OXYCODONE HCL 5 MG PO TABS: 5.0000 mg | ORAL_TABLET | Freq: Three times a day (TID) | ORAL | 0 refills | Status: DC | PRN

## 2023-04-11 MED ORDER — OXYCODONE HCL 5 MG/5ML PO SOLN
5.0000 mg | Freq: Once | ORAL | Status: AC | PRN
Start: 2023-04-11 — End: 2023-04-11

## 2023-04-11 MED ORDER — METHYLPREDNISOLONE ACETATE 40 MG/ML IJ SUSP
INTRAMUSCULAR | Status: DC | PRN
  Administered 2023-04-11: 40 mg via INTRAMUSCULAR

## 2023-04-11 MED ORDER — OXYCODONE HCL 5 MG PO TABS
ORAL_TABLET | ORAL | Status: AC
  Filled 2023-04-11: qty 1

## 2023-04-11 MED ORDER — KETOROLAC TROMETHAMINE 10 MG PO TABS: 10.0000 mg | ORAL_TABLET | Freq: Four times a day (QID) | ORAL | 0 refills | Status: DC

## 2023-04-11 MED ORDER — FENTANYL CITRATE (PF) 100 MCG/2ML IJ SOLN
25.0000 ug | INTRAMUSCULAR | Status: DC | PRN
  Administered 2023-04-11 (×2): 50 ug via INTRAVENOUS

## 2023-04-11 MED ORDER — ACETAMINOPHEN 160 MG/5ML PO SOLN: 325.0000 mg | ORAL | Status: DC | PRN

## 2023-04-11 MED ORDER — FENTANYL CITRATE (PF) 100 MCG/2ML IJ SOLN
INTRAMUSCULAR | Status: AC
  Filled 2023-04-11: qty 2

## 2023-04-11 MED ORDER — MIDAZOLAM HCL 2 MG/2ML IJ SOLN
INTRAMUSCULAR | Status: DC | PRN
  Administered 2023-04-11: 2 mg via INTRAVENOUS

## 2023-04-11 MED ORDER — PROPOFOL 500 MG/50ML IV EMUL
INTRAVENOUS | Status: DC | PRN
  Administered 2023-04-11: 180 ug/kg/min via INTRAVENOUS

## 2023-04-11 MED ORDER — DROPERIDOL 2.5 MG/ML IJ SOLN: 0.6250 mg | Freq: Once | INTRAMUSCULAR | Status: DC | PRN

## 2023-04-11 MED ORDER — LIDOCAINE-EPINEPHRINE (PF) 1 %-1:200000 IJ SOLN
INTRAMUSCULAR | Status: DC | PRN
  Administered 2023-04-11: 4 mL via INTRAMUSCULAR

## 2023-04-11 MED ORDER — ACETAMINOPHEN 325 MG PO TABS: 325.0000 mg | ORAL_TABLET | ORAL | Status: DC | PRN

## 2023-04-11 MED ORDER — ACETAMINOPHEN 10 MG/ML IV SOLN
INTRAVENOUS | Status: AC
  Filled 2023-04-11: qty 100

## 2023-04-11 MED ORDER — ACETAMINOPHEN 10 MG/ML IV SOLN
1000.0000 mg | Freq: Once | INTRAVENOUS | Status: DC | PRN
  Administered 2023-04-11: 1000 mg via INTRAVENOUS

## 2023-04-11 MED ORDER — LIDOCAINE 2% (20 MG/ML) 5 ML SYRINGE
INTRAMUSCULAR | Status: DC | PRN
  Administered 2023-04-11: 40 mg via INTRAVENOUS

## 2023-04-11 MED ORDER — ONDANSETRON HCL 4 MG/2ML IJ SOLN
INTRAMUSCULAR | Status: DC | PRN
  Administered 2023-04-11: 4 mg via INTRAVENOUS

## 2023-04-11 MED ORDER — FENTANYL CITRATE (PF) 250 MCG/5ML IJ SOLN
INTRAMUSCULAR | Status: DC | PRN
  Administered 2023-04-11 (×2): 50 ug via INTRAVENOUS

## 2023-04-11 MED ORDER — PROPOFOL 10 MG/ML IV BOLUS
INTRAVENOUS | Status: AC
  Filled 2023-04-11: qty 20

## 2023-04-11 MED ORDER — PROPOFOL 10 MG/ML IV BOLUS
INTRAVENOUS | Status: DC | PRN
  Administered 2023-04-11 (×3): 20 mg via INTRAVENOUS

## 2023-04-11 MED ORDER — OXYCODONE HCL 5 MG PO TABS
5.0000 mg | ORAL_TABLET | Freq: Once | ORAL | Status: AC | PRN
  Administered 2023-04-11: 5 mg via ORAL

## 2023-04-11 MED ORDER — SODIUM CHLORIDE 0.9 % IV SOLN: INTRAVENOUS | Status: DC

## 2023-04-11 MED ORDER — LACTATED RINGERS IV SOLN
INTRAVENOUS | Status: DC
Start: 2023-04-11 — End: 2023-04-11

## 2023-04-11 MED ORDER — MIDAZOLAM HCL 2 MG/2ML IJ SOLN
INTRAMUSCULAR | Status: AC
  Filled 2023-04-11: qty 2

## 2023-04-11 SURGICAL SUPPLY — 2 items
NDL SAFETY ECLIPSE 18X1.5 (NEEDLE) IMPLANT
SYR 10ML LL (SYRINGE) IMPLANT

## 2023-04-11 NOTE — Anesthesia Postprocedure Evaluation (Signed)
Anesthesia Post Note  Patient: Randy Wilson  Procedure(s) Performed: CLOSED MANIPULATION LEFT KNEE (Left: Knee)     Patient location during evaluation: PACU Anesthesia Type: MAC Level of consciousness: awake and alert Pain management: pain level controlled Vital Signs Assessment: post-procedure vital signs reviewed and stable Respiratory status: spontaneous breathing, nonlabored ventilation, respiratory function stable and patient connected to nasal cannula oxygen Cardiovascular status: stable and blood pressure returned to baseline Postop Assessment: no apparent nausea or vomiting Anesthetic complications: no  No notable events documented.  Last Vitals:  Vitals:   04/11/23 0945 04/11/23 1040  BP: (!) 178/111 (!) 151/87  Pulse: 65 66  Resp: 11 14  Temp:    SpO2: 99% 98%    Last Pain:  Vitals:   04/11/23 1054  TempSrc:   PainSc: 5                  [name]

## 2023-04-11 NOTE — Transfer of Care (Signed)
Immediate Anesthesia Transfer of Care Note  Patient: Randy Wilson  Procedure(s) Performed: CLOSED MANIPULATION LEFT KNEE (Left: Knee)  Patient Location: PACU  Anesthesia Type:MAC  Level of Consciousness: sedated  Airway & Oxygen Therapy: Patient Spontanous Breathing and Patient connected to face mask oxygen  Post-op Assessment: Report given to RN and Post -op Vital signs reviewed and stable  Post vital signs: Reviewed and stable  Last Vitals:  Vitals Value Taken Time  BP 151/87 04/11/23 1040  Temp 36.2 C 04/11/23 0907  Pulse 66 04/11/23 1040  Resp 14 04/11/23 1040  SpO2 98 % 04/11/23 1040    Last Pain:  Vitals:   04/11/23 1054  TempSrc:   PainSc: 5       Patients Stated Pain Goal: 5 (04/11/23 1040)  Complications: No notable events documented.

## 2023-04-11 NOTE — Therapy (Signed)
OUTPATIENT PHYSICAL THERAPY TREATMENT & RE-CERTIFICATION   Patient Name: Randy Wilson MRN: [id number] DOB:[birth year], 57 y.o., male Today's Date: 04/11/2023  END OF SESSION:  PT End of Session - 04/11/23 1256     Visit Number 10    Number of Visits 30    Date for PT Re-Evaluation 05/31/23    Authorization Type [employer] $52 copay    PT Start Time 1256    PT Stop Time 1356    PT Time Calculation (min) 60 min    Activity Tolerance Patient limited by pain    Behavior During Therapy [facility] for tasks assessed/performed                    Past Medical History:  Diagnosis Date   Crohn's disease of small intestine (HCC) 2017   followed by dr h. danis;  s/p colon resection 11-02-2015,  on humira   History of asthma    child   History of colon polyps    History of COVID-19 05/30/2020   positive result in epicrunny nose/cough x 3 weeks all symptoms resolved   History of gout    last flare up 1 year ago per pt on 04-03-2021   OA (osteoarthritis)    Right wrist fracture    fell off ladder per pt on 02-08-2021   Right wrist fracture, closed, initial encounter 01/22/2021   Past Surgical History:  Procedure Laterality Date   ANKLE SURGERY Left 2002   bone spurs removed   COLONOSCOPY     last one 02-25-2020  by dr h. danis   HARDWARE REMOVAL Right 04/06/2021   Procedure: Right wrist removal of hardware;  Surgeon: [name], MD;  Location: [facility] [place];  Service: Orthopedics;  Laterality: Right;  with local anesthesia Needs 30 minutes   LAPAROSCOPIC ILEOCECECTOMY N/A 11/02/2015   Procedure: LAPAROSCOPIC ILEOCECECTOMY;  Surgeon: [name], MD;  Location: [facility];  Service: General;  Laterality: N/A;   ORIF WRIST FRACTURE Right 02/09/2021   Procedure: OPEN REDUCTION INTERNAL FIXATION (ORIF) WRIST FRACTURE;  Surgeon: [name], MD;  Location: [facility] [place];  Service: Orthopedics;  Laterality: Right;  WITH REGIONAL   ROTATOR CUFF REPAIR Bilateral     SHOULDER ARTHROSCOPY W/ SUBACROMIAL DECOMPRESSION AND DISTAL CLAVICLE EXCISION Left 05/07/2008   @[facility]  by dr [name];   bone spurs removed   TOTAL KNEE ARTHROPLASTY Left 02/22/2023   Procedure: LEFT TOTAL KNEE ARTHROPLASTY;  Surgeon: [name], MD;  Location: [facility];  Service: Orthopedics;  Laterality: Left;   Patient Active Problem List   Diagnosis Date Noted   Arthrofibrosis of left total knee arthroplasty 04/10/2023   Status post total left knee replacement 02/22/2023   Hyperpigmented skin lesion 10/09/2022   Acute pain of left knee 08/01/2022   Dyslipidemia 03/08/2022   Crohn's disease of small intestine (HCC) 03/01/2022   Residual hemorrhoidal skin tags 03/31/2020   Prediabetes 01/20/2020   Erectile dysfunction 01/20/2020   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   Asthma 04/24/2015   Hx of colonic polyp 11/08/2014   History of colonic polyps 09/17/2014    PCP: Georgina Quint, MD  REFERRING PROVIDER: [name]*  REFERRING DIAG: W09.811 (ICD-10-CM) - Status post total left knee replacement  Rationale for Evaluation and Treatment: Rehabilitation  THERAPY DIAG:  Localized edema  Acute pain of left knee  Stiffness of left knee, not elsewhere classified  Muscle weakness (generalized)  Other abnormalities of gait and mobility  Unsteadiness on  feet  ONSET DATE: DOS: 02/22/23   SUBJECTIVE:                                                                                                                                                                                           SUBJECTIVE STATEMENT: He had manipulation under anesthesia this morning. OP note says flexion to almost 120*.  He now has CPM     PERTINENT HISTORY:  Crohn's disease, asthma, OA, hx Rt wrist fx 2022, bil RTC repair  PAIN:   NPRS scale: today upon arrival to PT 4/10 Pain location: Lt knee on sides and back  Pain description: aching Aggravating factors:  sitting, lying, walking too long, bending the knee Relieving factors: medication, ice, repositioning, muscle relaxer   PRECAUTIONS:  None  RED FLAGS: None   WEIGHT BEARING RESTRICTIONS:  No  FALLS:  Has patient fallen in last 6 months? 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  PLOF:  Independent and Leisure: bowling  PATIENT GOALS:  Return to bowling, be back to normal   OBJECTIVE:   PATIENT SURVEYS:  03/13/23 FOTO 33 (predicted 62)  COGNITIVE STATUS: Within functional limits for tasks assessed   SENSATION: WFL  GAIT: 03/13/23 Distance walked: 100' within clinc Assistive device utilized: Single point cane Level of assistance: Modified independence Comments: decr stance on Lt; decr hip/knee flexion on Lt   EDEMA: 03/13/23 Knee Joint Line: Rt: 39.2 cm  / Lt: 46 cm   LOWER EXTREMITY ROM:     ROM Right 03/13/23 Left 03/13/23 Left 03/19/23 Left  03/25/23 Left 03/27/23 Left 04/03/23 Left  04/11/23 Post MUA  Knee flexion  A: 45 P: 50 (54 sitting with percussive device) A: 65 P: 73 AAROM seated edge of mat table 67*  A: 79  (supine) A: 82 P: 84 Seated P: 91* A: 82* After manual therapy P: 100*  Knee extension A: 0 A: -19 (seated LAQ) P: -4 A: -10 P: -4 A: 10* seated heel propped on stool   A: -9 (seated LAQ) P: -2 Seated quad set -7* Supine  P: -4*   (Blank rows = not tested)   LOWER EXTREMITY MMT:    03/13/23: not formally tested; grossly 3-/5  MMT Right 04/01/2023 Left 04/01/2023  Knee flexion    Knee extension 5/5 95.6 lbs 4+/5 35.2, 39 lbs   (Blank rows = not tested)  Treatment 04/11/2023 Therapeutic Exercise: Sci Fit bike seat 14 with LEs only -  first 1 min rocking for stretch, then level 1 for 9 min full revolutions Seated heel slide with foot on pillow case to reduce resistance - ext / quad set 5 sec and active knee flexion with end range assist contralateral LE / hamstring set 5 sec for 15 reps Supine heel slide with lower leg on 18" ball with strap flexion stretch 5 sec and ext / leg press 5 sec for 15 reps; first 5 reps PT manual assist for max flex & ext  Manual Therapy: PROM with overpressure / Grade IV mobs seated flexion Contract - relax quads with flexion stretch Supine ext mobs Grade IV  Vasopneumatic for edema 34* high compression BLEs elevated 15 min   04/03/23 TherEx NuStep x 10 min; Seat 12/11 with flexion holds AA LAQ on Lt for extension motion x 10 reps AA heel slides x 10 reps with strap ROM measurements - see above for details Leg press 112# bil 3x10; LLE only 50# 3x10 LLLE on 8" step flexion holds 10 x 10 sec hold Forward step ups onto 8" step x 10 bil; light UE support Lateral step ups onto 8" step x 10 bil; light UE support   04/01/2023 TherEx NuStep x 10 min; Seat 12/11 with flexion holds Seated Lt knee LAQ with end range pause in flexion and extension 2 x 15 5 lbs    TherActivity (to improve ambulation, stair navigation) Leg press double leg 112 lbs x 15, single leg Lt 50 lbs 2 x 15 Step up forward with retro step off WB on Lt leg x 15 6 inch hurdle step over and back clearance with single hand on rail x 15 bilateral (performed to improve foot clearance in ambulation, single leg stance control)  Manual Seated Lt knee flexion c distraction/IR mobilization c movement c percussive device on on quad.   Modalities Vaso x 10 min; Lt knee; mod pressure 34 deg  03/27/23 TherEx NuStep x 10 min; Seat 12/11 with flexion holds LLE on 8" step 30 sec flexion holds x 5 reps Seated LLE AA knee flexion with RLE providing overpressure 10x10 sec holds AA heel slides x 10 reps on Lt  Manual Seated Lt knee  flexion PROM with contract relax to tolerance x 10 min  Modalities Vaso x 10 min; Lt knee; mod pressure 34 deg   PATIENT EDUCATION:  Education details: HEP Person educated: Patient Education method: Explanation, Demonstration, and Handouts Education comprehension: verbalized understanding, returned demonstration, and needs further education  HOME EXERCISE PROGRAM: Access Code: [email or web address] URL: [email or web address] Date: 03/21/2023 Prepared by: Vernon Prey April Kirstie Peri  Exercises - Long Sitting Quad Set  - 5-10 x daily - 7 x weekly - 1 sets - 5-10 reps - 5 sec hold - Supine Heel Slide with Strap  - 5-10 x daily - 7 x weekly - 1 sets - 5-10 reps - 5 sec hold - Seated Knee Extension AROM  - 5-10 x daily - 7 x weekly - 1 sets - 5-10 reps - 3-5 sec hold - Prone Quadriceps Stretch with Strap  - 1 x daily - 7 x weekly - 2 sets - 30 sec hold - Prone Knee Flexion  - 1 x daily - 7 x weekly - 2 sets - 10 reps - Sit to Stand  - 1 x daily - 7 x weekly - 2 sets - 10 reps   ASSESSMENT:  CLINICAL IMPRESSION: Patient has  increased range post manipulation under anesthesia. He arrived with limitation from pain & guarding but range improved during session with repetitions of exercises that emphasized both flexion and extension.  He responded to manual therapy for range gains.  Patient needs PT 5x/wk for first 2 weeks following manipulation to maximize benefits. Patient needs skilled PT to improve functional outcomes for his TKA.    GOALS: Goals reviewed with patient? Yes  SHORT TERM GOALS: Target date: 04/24/2023   Independent with updated HEP Goal status:  updated 04/11/2023  2.  Lt knee PROM 2-0-100 deg for improved mobility and function Goal status: updated 04/11/2023   LONG TERM GOALS: Target date: 05/30/2022  Independent with final HEP Goal status: on going 04/01/2023  2.  FOTO score improved to 62 Goal status: on going 04/01/2023  3.  Lt knee AROM improved to 5-0-95  for improved function and mobility Goal status: on going 04/01/2023  4.  Report pain < 2/10 with standing and walking activities for improved function Goal status: on going 04/01/2023  5.  Amb with LRAD modified independent without significant gait deviations for improved function Goal status: on going 04/01/2023   PLAN:  PT FREQUENCY:  5x/wk for 2 weeks following MUA, then 2x/wk for 5 weeks  PT DURATION:  7 weeks additional following MUA  PLANNED INTERVENTIONS: 97164- PT Re-evaluation, 97110-Therapeutic exercises, 97530- Therapeutic activity, 97112- Neuromuscular re-education, 97535- Self Care, 19147- Manual therapy, [id number]- Gait training, [phone]- Aquatic Therapy, 97014- Electrical stimulation (unattended), 97016- Vasopneumatic device, Patient/Family education, Balance training, Stair training, Taping, Dry Needling, DME instructions, Cryotherapy, and Moist heat.  PLAN FOR NEXT SESSION: Manual therapy & exercise for range,  update HEP, vaso for edema   NEXT MD VISIT: 04/24/23    Vladimir Faster, PT, DPT 04/11/2023, 1:49 PM

## 2023-04-11 NOTE — Discharge Instructions (Addendum)
Do expect left knee swelling -ice periodically as needed Work aggressively on range of motion of her left knee. Resume outpatient physical therapy as soon as possible.   Post Anesthesia Home Care Instructions  Activity: Get plenty of rest for the remainder of the day. A responsible individual must stay with you for 24 hours following the procedure.  For the next 24 hours, DO NOT: -Drive a car -Advertising copywriter -Drink alcoholic beverages -Take any medication unless instructed by your physician -Make any legal decisions or sign important papers.  Meals: Start with liquid foods such as gelatin or soup. Progress to regular foods as tolerated. Avoid greasy, spicy, heavy foods. If nausea and/or vomiting occur, drink only clear liquids until the nausea and/or vomiting subsides. Call your physician if vomiting continues.  Special Instructions/Symptoms: Your throat may feel dry or sore from the anesthesia or the breathing tube placed in your throat during surgery. If this causes discomfort, gargle with warm salt water. The discomfort should disappear within 24 hours.

## 2023-04-11 NOTE — Op Note (Signed)
Operative Note  Date of operation: 04/11/2023 Preoperative diagnosis: Left knee arthrofibrosis status post total knee arthroplasty Postoperative diagnosis: Same  Procedure: Left knee manipulation under anesthesia  Surgeon: Vanita Panda. Magnus Ivan, MD  Anesthesia: #1 mask ventilation and IV sedation, #2 local Complications: None  Indications: The patient is a 56 year old gentleman who underwent a primary left total knee arthroplasty on February 22, 2023 secondary to severe arthritis of his left knee.  Postoperatively he has been through extensive physical therapy but has been unable to flex his knee past 90 degrees.  His last flexion recorded flexion was to about 88 degrees.  At this point we have recommended a manipulation under anesthesia and he understands this as well.  We did discuss the risks of fracture and continued knee stiffness and pain.  He understands her goals are hopefully improve range of motion of his left total knee arthroplasty.  He does have physical therapy this afternoon scheduled as an outpatient.  Procedure description: After informed consent was obtained and the appropriate left knee was marked, the patient was brought to the operating room on a stretcher.  Mask ventilation and IV sedation was obtained.  A timeout was called and he was identified as correct patient correct left knee.  Preoperatively I could only flex him today to about 70 degrees.  With gentle manipulation we were able to flex him to almost 120 degrees.  I put him through several cycles of motion and his extension was almost full and his flexion again was close to full.  I did take preprocedure and postprocedure photographs of the need to be over show the patient.  We then cleaned the superior lateral aspect of the knee with alcohol swabs and placed a mixture of quarter percent Marcaine with epinephrine mixed with 1 cc of Depo-Medrol into the knee joint.  A Band-Aid was placed over that injection site.  He  was taken to the recovery room in stable condition.

## 2023-04-11 NOTE — Anesthesia Preprocedure Evaluation (Addendum)
Anesthesia Evaluation  Patient identified by MRN, date of birth, ID band Patient awake    Reviewed: Allergy & Precautions, NPO status , Patient's Chart, lab work & pertinent test results  Airway Mallampati: I  TM Distance: >3 FB Neck ROM: Full    Dental  (+) Teeth Intact, Dental Advisory Given   Pulmonary asthma , former smoker   breath sounds clear to auscultation       Cardiovascular negative cardio ROS  Rhythm:Regular Rate:Normal     Neuro/Psych negative neurological ROS  negative psych ROS   GI/Hepatic negative GI ROS, Neg liver ROS,,,  Endo/Other  negative endocrine ROS    Renal/GU negative Renal ROS     Musculoskeletal  (+) Arthritis ,    Abdominal   Peds  Hematology negative hematology ROS (+)   Anesthesia Other Findings   Reproductive/Obstetrics                             Anesthesia Physical Anesthesia Plan  ASA: 2  Anesthesia Plan: General   Post-op Pain Management: Tylenol PO (pre-op)* and Toradol IV (intra-op)*   Induction: Intravenous  PONV Risk Score and Plan: 2 and Ondansetron, Dexamethasone, Midazolam and Propofol infusion  Airway Management Planned: Mask  Additional Equipment: None  Intra-op Plan:   Post-operative Plan:   Informed Consent: I have reviewed the patients History and Physical, chart, labs and discussed the procedure including the risks, benefits and alternatives for the proposed anesthesia with the patient or authorized representative who has indicated his/her understanding and acceptance.       Plan Discussed with: CRNA  Anesthesia Plan Comments:        Anesthesia Quick Evaluation

## 2023-04-11 NOTE — Interval H&P Note (Signed)
History and Physical Interval Note: The patient understands that he is here today for a left knee manipulation under anesthesia given his postoperative arthrofibrosis status post a left total knee replacement.  There has been no acute or interval change in his medical status.  The risks and benefits of surgery have been discussed in detail and informed consent has been obtained.  The left operative knee has been marked.  04/11/2023 8:45 AM  Randy Wilson  has presented today for surgery, with the diagnosis of arthrofibrosis left total knee.  The various methods of treatment have been discussed with the patient and family. After consideration of risks, benefits and other options for treatment, the patient has consented to  Procedure(s): CLOSED MANIPULATION LEFT KNEE (Left) as a surgical intervention.  The patient's history has been reviewed, patient examined, no change in status, stable for surgery.  I have reviewed the patient's chart and labs.  Questions were answered to the patient's satisfaction.     [name]

## 2023-04-11 NOTE — Telephone Encounter (Signed)
Patient would like a note to return to work after Thanksgiving. He will be here 11/15 for PT. Will pick up the note at that time.

## 2023-04-12 ENCOUNTER — Encounter: Payer: Self-pay | Admitting: Rehabilitative and Restorative Service Providers"

## 2023-04-12 ENCOUNTER — Ambulatory Visit (INDEPENDENT_AMBULATORY_CARE_PROVIDER_SITE_OTHER): Payer: BC Managed Care – PPO | Admitting: Rehabilitative and Restorative Service Providers"

## 2023-04-12 DIAGNOSIS — M25662 Stiffness of left knee, not elsewhere classified: Secondary | ICD-10-CM

## 2023-04-12 DIAGNOSIS — M25562 Pain in left knee: Secondary | ICD-10-CM | POA: Diagnosis not present

## 2023-04-12 DIAGNOSIS — R600 Localized edema: Secondary | ICD-10-CM | POA: Diagnosis not present

## 2023-04-12 DIAGNOSIS — M6281 Muscle weakness (generalized): Secondary | ICD-10-CM | POA: Diagnosis not present

## 2023-04-12 DIAGNOSIS — R2681 Unsteadiness on feet: Secondary | ICD-10-CM

## 2023-04-12 DIAGNOSIS — R2689 Other abnormalities of gait and mobility: Secondary | ICD-10-CM

## 2023-04-12 NOTE — Telephone Encounter (Signed)
Note written; patient aware this is ready and we will print it off when he comes in for PT appt

## 2023-04-12 NOTE — Therapy (Signed)
OUTPATIENT PHYSICAL THERAPY TREATMENT & RE-CERTIFICATION   Patient Name: Randy Wilson MRN: [id number] DOB:August 29, 1965, 57 y.o., male Today's Date: 04/12/2023  END OF SESSION:  PT End of Session - 04/12/23 3086     Visit Number 11    Number of Visits 30    Date for PT Re-Evaluation 05/31/23    Authorization Type BCBS $52 copay    Progress Note Due on Visit 20    PT Start Time 0921    PT Stop Time 1011    PT Time Calculation (min) 50 min    Activity Tolerance Patient tolerated treatment well    Behavior During Therapy [facility] for tasks assessed/performed                     Past Medical History:  Diagnosis Date   Crohn's disease of small intestine (HCC) 2017   followed by dr h. danis;  s/p colon resection 11-02-2015,  on humira   History of asthma    child   History of colon polyps    History of COVID-19 05/30/2020   positive result in epicrunny nose/cough x 3 weeks all symptoms resolved   History of gout    last flare up 1 year ago per pt on 04-03-2021   OA (osteoarthritis)    Right wrist fracture    fell off ladder per pt on 02-08-2021   Right wrist fracture, closed, initial encounter 01/22/2021   Past Surgical History:  Procedure Laterality Date   ANKLE SURGERY Left 2002   bone spurs removed   COLONOSCOPY     last one 02-25-2020  by dr h. danis   HARDWARE REMOVAL Right 04/06/2021   Procedure: Right wrist removal of hardware;  Surgeon: [name], MD;  Location: [facility] [place];  Service: Orthopedics;  Laterality: Right;  with local anesthesia Needs 30 minutes   LAPAROSCOPIC ILEOCECECTOMY N/A 11/02/2015   Procedure: LAPAROSCOPIC ILEOCECECTOMY;  Surgeon: [name], MD;  Location: [facility];  Service: General;  Laterality: N/A;   ORIF WRIST FRACTURE Right 02/09/2021   Procedure: OPEN REDUCTION INTERNAL FIXATION (ORIF) WRIST FRACTURE;  Surgeon: [name], MD;  Location: [facility];  Service: Orthopedics;  Laterality: Right;  WITH  REGIONAL   ROTATOR CUFF REPAIR Bilateral    SHOULDER ARTHROSCOPY W/ SUBACROMIAL DECOMPRESSION AND DISTAL CLAVICLE EXCISION Left 05/07/2008   @[facility]  by dr [name];   bone spurs removed   TOTAL KNEE ARTHROPLASTY Left 02/22/2023   Procedure: LEFT TOTAL KNEE ARTHROPLASTY;  Surgeon: [name], MD;  Location: [facility];  Service: Orthopedics;  Laterality: Left;   Patient Active Problem List   Diagnosis Date Noted   Arthrofibrosis of left total knee arthroplasty 04/10/2023   Status post total left knee replacement 02/22/2023   Hyperpigmented skin lesion 10/09/2022   Acute pain of left knee 08/01/2022   Dyslipidemia 03/08/2022   Crohn's disease of small intestine (HCC) 03/01/2022   Residual hemorrhoidal skin tags 03/31/2020   Prediabetes 01/20/2020   Erectile dysfunction 01/20/2020   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   Asthma 04/24/2015   Hx of colonic polyp 11/08/2014   History of colonic polyps 09/17/2014    PCP: Georgina Quint, MD  REFERRING PROVIDER: [name]*  REFERRING DIAG: V78.469 (ICD-10-CM) - Status post total left knee replacement  Rationale for Evaluation and Treatment: Rehabilitation  THERAPY DIAG:  Localized edema  Stiffness of left knee, not elsewhere classified  Acute pain of left knee  Muscle weakness (generalized)  Other abnormalities of gait and mobility  Unsteadiness on feet  ONSET DATE: DOS: 02/22/23   SUBJECTIVE:                                                                                                                                                                                           SUBJECTIVE STATEMENT: Pt indicated having thigh feeling, real sore since manipulation.  Pt indicated other than that doing ok.     PERTINENT HISTORY:  Crohn's disease, asthma, OA, hx Rt wrist fx 2022, bil RTC repair  PAIN:   NPRS scale: 3/10 Pain location: Lt knee on sides and back  Pain description:  aching Aggravating factors: sitting, lying, walking too long, bending the knee Relieving factors: medication, ice, repositioning, muscle relaxer   PRECAUTIONS:  None  RED FLAGS: None   WEIGHT BEARING RESTRICTIONS:  No  FALLS:  Has patient fallen in last 6 months? 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  PLOF:  Independent and Leisure: bowling  PATIENT GOALS:  Return to bowling, be back to normal   OBJECTIVE:   PATIENT SURVEYS:  03/13/23 FOTO 33 (predicted 62)  COGNITIVE STATUS: Within functional limits for tasks assessed   SENSATION: WFL  GAIT: 03/13/23 Distance walked: 100' within clinc Assistive device utilized: Single point cane Level of assistance: Modified independence Comments: decr stance on Lt; decr hip/knee flexion on Lt   EDEMA: 03/13/23 Knee Joint Line: Rt: 39.2 cm  / Lt: 46 cm   LOWER EXTREMITY ROM:     ROM Right 03/13/23 Left 03/13/23 Left 03/19/23 Left  03/25/23 Left 03/27/23 Left 04/03/23 Left  04/11/23 Post MUA Left 04/12/2023  Knee flexion  A: 45 P: 50 (54 sitting with percussive device) A: 65 P: 73 AAROM seated edge of mat table 67*  A: 79  (supine) A: 82 P: 84 Seated P: 91* A: 82* After manual therapy P: 100* AROM supine heel slide 103  Knee extension A: 0 A: -19 (seated LAQ) P: -4 A: -10 P: -4 A: 10* seated heel propped on stool   A: -9 (seated LAQ) P: -2 Seated quad set -7* Supine  P: -4*    (Blank rows = not tested)   LOWER EXTREMITY MMT:    03/13/23: not formally tested; grossly 3-/5  MMT Right 04/01/2023 Left 04/01/2023  Knee flexion    Knee extension 5/5 95.6 lbs 4+/5 35.2, 39  lbs   (Blank rows = not tested)                  Today's  Treatment                                                       DATE: 04/12/2023 TherEx UBE LE only seat 14 for 4 mins, seat 13 for 6 mins for ROM gains.  Incline gastroc stretch 30 sec x 3 bilateral Seated Lt knee LAQ with end range pause in flexion and extension 2 x 15 5 lbs (contralateral leg moving opposite) Seated Lt leg AAROM with Rt leg overpressure 15 seconds x 6  Supine heel slide/quad set combo 5 sec each x 10    TherActivity (to improve ambulation, stair navigation) Leg press double leg 118 lbs x 15, single leg 50 lbs 2 x 15, performed bilaterally   Manual Seated Lt knee flexion c distraction/IR mobilization c movement c percussive device on on quad.   Modalities Vaso x 10 min; Lt knee; mod pressure 34 deg  Today's Treatment                                                       DATE:  04/11/2023 Therapeutic Exercise: Sci Fit bike seat 14 with LEs only - first 1 min rocking for stretch, then level 1 for 9 min full revolutions Seated heel slide with foot on pillow case to reduce resistance - ext / quad set 5 sec and active knee flexion with end range assist contralateral LE / hamstring set 5 sec for 15 reps Supine heel slide with lower leg on 18" ball with strap flexion stretch 5 sec and ext / leg press 5 sec for 15 reps; first 5 reps PT manual assist for max flex & ext  Manual Therapy: PROM with overpressure / Grade IV mobs seated flexion Contract - relax quads with flexion stretch Supine ext mobs Grade IV  Vasopneumatic for edema 34* high compression BLEs elevated 15 min   Today's Treatment                                                       DATE: 04/03/23 TherEx NuStep x 10 min; Seat 12/11 with flexion holds AA LAQ on Lt for extension motion x 10 reps AA heel slides x 10 reps with strap ROM measurements - see above for details Leg press 112# bil 3x10; LLE only 50# 3x10 LLLE on 8" step flexion holds 10 x 10 sec hold Forward step ups onto 8" step x 10 bil; light UE  support Lateral step ups onto 8" step x 10 bil; light UE support   Today's Treatment  DATE: 04/01/2023 TherEx NuStep x 10 min; Seat 12/11 with flexion holds Seated Lt knee LAQ with end range pause in flexion and extension 2 x 15 5 lbs    TherActivity (to improve ambulation, stair navigation) Leg press double leg 112 lbs x 15, single leg Lt 50 lbs 2 x 15 Step up forward with retro step off WB on Lt leg x 15 6 inch hurdle step over and back clearance with single hand on rail x 15 bilateral (performed to improve foot clearance in ambulation, single leg stance control)  Manual Seated Lt knee flexion c distraction/IR mobilization c movement c percussive device on on quad.   Modalities Vaso x 10 min; Lt knee; mod pressure 34 deg  Today's Treatment                                                       DATE: 03/27/23 TherEx NuStep x 10 min; Seat 12/11 with flexion holds LLE on 8" step 30 sec flexion holds x 5 reps Seated LLE AA knee flexion with RLE providing overpressure 10x10 sec holds AA heel slides x 10 reps on Lt  Manual Seated Lt knee flexion PROM with contract relax to tolerance x 10 min  Modalities Vaso x 10 min; Lt knee; mod pressure 34 deg   PATIENT EDUCATION:  Education details: HEP Person educated: Patient Education method: Explanation, Demonstration, and Handouts Education comprehension: verbalized understanding, returned demonstration, and needs further education  HOME EXERCISE PROGRAM: Access Code: [email or web address] URL: [email or web address] Date: 03/21/2023 Prepared by: Vernon Prey April Kirstie Peri  Exercises - Long Sitting Quad Set  - 5-10 x daily - 7 x weekly - 1 sets - 5-10 reps - 5 sec hold - Supine Heel Slide with Strap  - 5-10 x daily - 7 x weekly - 1 sets - 5-10 reps - 5 sec hold - Seated Knee Extension AROM  - 5-10 x daily - 7 x weekly - 1 sets - 5-10 reps - 3-5 sec hold - Prone Quadriceps  Stretch with Strap  - 1 x daily - 7 x weekly - 2 sets - 30 sec hold - Prone Knee Flexion  - 1 x daily - 7 x weekly - 2 sets - 10 reps - Sit to Stand  - 1 x daily - 7 x weekly - 2 sets - 10 reps   ASSESSMENT:  CLINICAL IMPRESSION: Flexion mobility quality continued to show progress at this time as noted.  Resumed some strengthening intervention with plan of continued increase in functional strength in future while still working on strength.    GOALS: Goals reviewed with patient? Yes  SHORT TERM GOALS: Target date: 04/24/2023   Independent with updated HEP Goal status:  updated 04/11/2023  2.  Lt knee PROM 2-0-100 deg for improved mobility and function Goal status: updated 04/11/2023   LONG TERM GOALS: Target date: 05/30/2022  Independent with final HEP Goal status: on going 04/01/2023  2.  FOTO score improved to 62 Goal status: on going 04/01/2023  3.  Lt knee AROM improved to 5-0-95 for improved function and mobility Goal status: on going 04/01/2023  4.  Report pain < 2/10 with standing and walking activities for improved function Goal status: on going 04/01/2023  5.  Amb with LRAD modified independent without significant gait deviations for improved function Goal status:  on going 04/01/2023   PLAN:  PT FREQUENCY:  5x/wk for 2 weeks following MUA, then 2x/wk for 5 weeks  PT DURATION:  7 weeks additional following MUA  PLANNED INTERVENTIONS: 97164- PT Re-evaluation, 97110-Therapeutic exercises, 97530- Therapeutic activity, 97112- Neuromuscular re-education, 97535- Self Care, 16109- Manual therapy, [id number]- Gait training, [phone]- Aquatic Therapy, 97014- Electrical stimulation (unattended), 97016- Vasopneumatic device, Patient/Family education, Balance training, Stair training, Taping, Dry Needling, DME instructions, Cryotherapy, and Moist heat.  PLAN FOR NEXT SESSION: Manual and therex for mobility, strength.    NEXT MD VISIT: 04/24/23   Chyrel Masson, PT, DPT, OCS,  ATC 04/12/23  10:04 AM

## 2023-04-15 ENCOUNTER — Ambulatory Visit (INDEPENDENT_AMBULATORY_CARE_PROVIDER_SITE_OTHER): Payer: BC Managed Care – PPO | Admitting: Physical Therapy

## 2023-04-15 ENCOUNTER — Encounter (HOSPITAL_BASED_OUTPATIENT_CLINIC_OR_DEPARTMENT_OTHER): Payer: Self-pay | Admitting: Orthopaedic Surgery

## 2023-04-15 DIAGNOSIS — M25562 Pain in left knee: Secondary | ICD-10-CM | POA: Diagnosis not present

## 2023-04-15 DIAGNOSIS — M25662 Stiffness of left knee, not elsewhere classified: Secondary | ICD-10-CM

## 2023-04-15 DIAGNOSIS — R2689 Other abnormalities of gait and mobility: Secondary | ICD-10-CM

## 2023-04-15 DIAGNOSIS — M6281 Muscle weakness (generalized): Secondary | ICD-10-CM | POA: Diagnosis not present

## 2023-04-15 DIAGNOSIS — R600 Localized edema: Secondary | ICD-10-CM | POA: Diagnosis not present

## 2023-04-15 NOTE — Therapy (Signed)
OUTPATIENT PHYSICAL THERAPY TREATMENT   Patient Name: Randy Wilson MRN: [id number] DOB:1965/08/09, 57 y.o., male Today's Date: 04/15/2023  END OF SESSION:  PT End of Session - 04/15/23 0848     Visit Number 12    Number of Visits 30    Date for PT Re-Evaluation 05/31/23    Authorization Type [employer] $52 copay    Progress Note Due on Visit 20    PT Start Time 0845    PT Stop Time 0940    PT Time Calculation (min) 55 min    Activity Tolerance Patient tolerated treatment well    Behavior During Therapy [facility] for tasks assessed/performed                      Past Medical History:  Diagnosis Date   Crohn's disease of small intestine (HCC) 2017   followed by dr h. danis;  s/p colon resection 11-02-2015,  on humira   History of asthma    child   History of colon polyps    History of COVID-19 05/30/2020   positive result in epicrunny nose/cough x 3 weeks all symptoms resolved   History of gout    last flare up 1 year ago per pt on 04-03-2021   OA (osteoarthritis)    Right wrist fracture    fell off ladder per pt on 02-08-2021   Right wrist fracture, closed, initial encounter 01/22/2021   Past Surgical History:  Procedure Laterality Date   ANKLE SURGERY Left 2002   bone spurs removed   COLONOSCOPY     last one 02-25-2020  by dr h. danis   HARDWARE REMOVAL Right 04/06/2021   Procedure: Right wrist removal of hardware;  Surgeon: [name], MD;  Location: [facility] [place];  Service: Orthopedics;  Laterality: Right;  with local anesthesia Needs 30 minutes   KNEE CLOSED REDUCTION Left 04/11/2023   Procedure: CLOSED MANIPULATION LEFT KNEE;  Surgeon: [name], MD;  [facility]: [facility];  Service: Orthopedics;  Laterality: Left;   LAPAROSCOPIC ILEOCECECTOMY N/A 11/02/2015   Procedure: LAPAROSCOPIC ILEOCECECTOMY;  Surgeon: [name], MD;  Location: [facility];  Service: General;  Laterality: N/A;   ORIF WRIST FRACTURE Right  02/09/2021   Procedure: OPEN REDUCTION INTERNAL FIXATION (ORIF) WRIST FRACTURE;  Surgeon: [name], MD;  Location: [facility] [place];  Service: Orthopedics;  Laterality: Right;  WITH REGIONAL   ROTATOR CUFF REPAIR Bilateral    SHOULDER ARTHROSCOPY W/ SUBACROMIAL DECOMPRESSION AND DISTAL CLAVICLE EXCISION Left 05/07/2008   @[facility]  by dr [name];   bone spurs removed   TOTAL KNEE ARTHROPLASTY Left 02/22/2023   Procedure: LEFT TOTAL KNEE ARTHROPLASTY;  Surgeon: [name], MD;  Location: [facility];  Service: Orthopedics;  Laterality: Left;   Patient Active Problem List   Diagnosis Date Noted   Arthrofibrosis of left total knee arthroplasty 04/10/2023   Status post total left knee replacement 02/22/2023   Hyperpigmented skin lesion 10/09/2022   Acute pain of left knee 08/01/2022   Dyslipidemia 03/08/2022   Crohn's disease of small intestine (HCC) 03/01/2022   Residual hemorrhoidal skin tags 03/31/2020   Prediabetes 01/20/2020   Erectile dysfunction 01/20/2020   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   Asthma 04/24/2015   Hx of colonic polyp 11/08/2014   History of colonic polyps 09/17/2014    PCP: Georgina Quint, MD  REFERRING PROVIDER: [name]*  REFERRING DIAG: W09.811 (ICD-10-CM) - Status post total left knee replacement  Rationale for Evaluation and Treatment: Rehabilitation  THERAPY DIAG:  Localized edema  Stiffness of left knee, not elsewhere classified  Acute pain of left knee  Muscle weakness (generalized)  Other abnormalities of gait and mobility  ONSET DATE: DOS: 02/22/23   SUBJECTIVE:                                                                                                                                                                                           SUBJECTIVE STATEMENT: He got bike at [facility] gym for 20 min. He is using CPM up to 110*.     PERTINENT HISTORY:  Crohn's disease, asthma, OA, hx Rt  wrist fx 2022, bil RTC repair  PAIN:   NPRS scale:  2/10 and since last PT 2/10 - 3/10 Pain location: Lt knee on sides and back  Pain description: aching Aggravating factors: sitting, lying, walking too long, bending the knee Relieving factors: medication, ice, repositioning, muscle relaxer   PRECAUTIONS:  None  RED FLAGS: None   WEIGHT BEARING RESTRICTIONS:  No  FALLS:  Has patient fallen in last 6 months? 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  PLOF:  Independent and Leisure: bowling  PATIENT GOALS:  Return to bowling, be back to normal   OBJECTIVE:   PATIENT SURVEYS:  03/13/23 FOTO 33 (predicted 62)  COGNITIVE STATUS: Within functional limits for tasks assessed   SENSATION: WFL  GAIT: 03/13/23 Distance walked: 100' within clinc Assistive device utilized: Single point cane Level of assistance: Modified independence Comments: decr stance on Lt; decr hip/knee flexion on Lt   EDEMA: 03/13/23 Knee Joint Line: Rt: 39.2 cm  / Lt: 46 cm   LOWER EXTREMITY ROM:     ROM Right 03/13/23 Left 03/13/23 Left 03/19/23 Left  03/25/23 Left 03/27/23 Left 04/03/23 Left  04/11/23 Post MUA Left 04/12/2023  Knee flexion  A: 45 P: 50 (54 sitting with percussive device) A: 65 P: 73 AAROM seated edge of mat table 67*  A: 79  (supine) A: 82 P: 84 Seated P: 91* A: 82* After manual therapy P: 100* AROM supine heel slide 103  Knee extension A: 0 A: -19 (seated LAQ) P: -4 A: -10 P: -4 A: 10* seated heel propped on stool   A: -9 (seated LAQ) P: -2 Seated quad set -7* Supine  P: -4*    (Blank rows = not tested)  LOWER EXTREMITY MMT:    03/13/23: not formally tested; grossly 3-/5  MMT  Right 04/01/2023 Left 04/01/2023  Knee flexion    Knee extension 5/5 95.6 lbs 4+/5 35.2, 39 lbs   (Blank rows = not tested)                  Today's Treatment                                                       DATE: 04/15/2023 TherEx Precor bike seat 11 for first 1 min stretching & 1 min slow full revolutions, then level 1 full revolutions 6 min. Incline board straddle step gastroc stretch 30 sec x 3 reps LLE.  PT demo how to simulate with step and pt verbalized understanding.  Heel raises on incline board 10 reps 2 sets Knee flexion stretch with foot against wall 10 sec hold 3 reps 3 sets moving stance LE closer each set. Pt verbalized understanding as HEP or activity when return to work.   Leg press BLEs 125# 15 reps;  LLE only 62# 15 reps Knee ext machine BLEs concentric & eccentric 20# 10 reps;   BLE concentric & isometric / eccentric LLE 5# 10 reps   Manual Knee ext with mob belt standing on incline board. Seated Lt knee flexion  Modalities Vaso x 10 min; Lt knee; mod pressure 34 deg with elevation   Treatment                                                       DATE: 04/12/2023 TherEx UBE LE only seat 14 for 4 mins, seat 13 for 6 mins for ROM gains.  Incline gastroc stretch 30 sec x 3 bilateral Seated Lt knee LAQ with end range pause in flexion and extension 2 x 15 5 lbs (contralateral leg moving opposite) Seated Lt leg AAROM with Rt leg overpressure 15 seconds x 6  Supine heel slide/quad set combo 5 sec each x 10    TherActivity (to improve ambulation, stair navigation) Leg press double leg 118 lbs x 15, single leg 50 lbs 2 x 15, performed bilaterally   Manual Seated Lt knee flexion c distraction/IR mobilization c movement c percussive device on on quad.   Modalities Vaso x 10 min; Lt knee; mod pressure 34 deg  Treatment                                                       DATE:  04/11/2023 Therapeutic Exercise: Sci Fit bike seat 14 with LEs only - first 1  min rocking for stretch, then level 1 for 9 min full revolutions Seated heel slide with foot on pillow case to reduce resistance - ext / quad set 5 sec and active knee flexion with end range assist contralateral LE / hamstring set 5 sec for 15 reps Supine heel slide with lower leg on 18" ball with strap flexion stretch 5 sec and ext / leg press 5 sec for  15 reps; first 5 reps PT manual assist for max flex & ext  Manual Therapy: PROM with overpressure / Grade IV mobs seated flexion Contract - relax quads with flexion stretch Supine ext mobs Grade IV  Vasopneumatic for edema 34* high compression BLEs elevated 15 min    PATIENT EDUCATION:  Education details: HEP Person educated: Patient Education method: Programmer, multimedia, Facilities manager, and Handouts Education comprehension: verbalized understanding, returned demonstration, and needs further education  HOME EXERCISE PROGRAM: Access Code: [email or web address] URL: [email or web address] Date: 03/21/2023 Prepared by: Vernon Prey April Kirstie Peri  Exercises - Long Sitting Quad Set  - 5-10 x daily - 7 x weekly - 1 sets - 5-10 reps - 5 sec hold - Supine Heel Slide with Strap  - 5-10 x daily - 7 x weekly - 1 sets - 5-10 reps - 5 sec hold - Seated Knee Extension AROM  - 5-10 x daily - 7 x weekly - 1 sets - 5-10 reps - 3-5 sec hold - Prone Quadriceps Stretch with Strap  - 1 x daily - 7 x weekly - 2 sets - 30 sec hold - Prone Knee Flexion  - 1 x daily - 7 x weekly - 2 sets - 10 reps - Sit to Stand  - 1 x daily - 7 x weekly - 2 sets - 10 reps   ASSESSMENT:  CLINICAL IMPRESSION: PT progressed stretches which he appears to understand as HEP with recommendation for 1-2 exercises each of awake hours to limit stiffness issues. Patient increased range and functional activities with repetition and PT instruction for progressive exercises.  Pt continues to benefit from skilled PT.   GOALS: Goals reviewed with patient? Yes  SHORT TERM GOALS: Target date:  04/24/2023   Independent with updated HEP Goal status:  updated 04/11/2023  2.  Lt knee PROM 2-0-100 deg for improved mobility and function Goal status: updated 04/11/2023   LONG TERM GOALS: Target date: 05/30/2022  Independent with final HEP Goal status: on going 04/01/2023  2.  FOTO score improved to 62 Goal status: on going 04/01/2023  3.  Lt knee AROM improved to 5-0-95 for improved function and mobility Goal status: on going 04/01/2023  4.  Report pain < 2/10 with standing and walking activities for improved function Goal status: on going 04/01/2023  5.  Amb with LRAD modified independent without significant gait deviations for improved function Goal status: on going 04/01/2023   PLAN:  PT FREQUENCY:  5x/wk for 2 weeks following MUA, then 2x/wk for 5 weeks  PT DURATION:  7 weeks additional following MUA  PLANNED INTERVENTIONS: 97164- PT Re-evaluation, 97110-Therapeutic exercises, 97530- Therapeutic activity, 97112- Neuromuscular re-education, 97535- Self Care, 54098- Manual therapy, [id number]- Gait training, [phone]- Aquatic Therapy, 97014- Electrical stimulation (unattended), 97016- Vasopneumatic device, Patient/Family education, Balance training, Stair training, Taping, Dry Needling, DME instructions, Cryotherapy, and Moist heat.  PLAN FOR NEXT SESSION: measure range, continue to update HEP & pt education,  Manual and therex for mobility, strength.    NEXT MD VISIT: 04/24/23    Vladimir Faster, PT, DPT 04/15/2023, 10:50 AM

## 2023-04-16 ENCOUNTER — Encounter: Payer: Self-pay | Admitting: Gastroenterology

## 2023-04-16 ENCOUNTER — Encounter: Payer: BC Managed Care – PPO | Admitting: Physical Therapy

## 2023-04-17 ENCOUNTER — Ambulatory Visit (INDEPENDENT_AMBULATORY_CARE_PROVIDER_SITE_OTHER): Payer: BC Managed Care – PPO | Admitting: Physical Therapy

## 2023-04-17 ENCOUNTER — Encounter: Payer: Self-pay | Admitting: Physical Therapy

## 2023-04-17 DIAGNOSIS — M25662 Stiffness of left knee, not elsewhere classified: Secondary | ICD-10-CM

## 2023-04-17 DIAGNOSIS — M25562 Pain in left knee: Secondary | ICD-10-CM | POA: Diagnosis not present

## 2023-04-17 DIAGNOSIS — R2689 Other abnormalities of gait and mobility: Secondary | ICD-10-CM

## 2023-04-17 DIAGNOSIS — M6281 Muscle weakness (generalized): Secondary | ICD-10-CM | POA: Diagnosis not present

## 2023-04-17 DIAGNOSIS — R600 Localized edema: Secondary | ICD-10-CM | POA: Diagnosis not present

## 2023-04-17 NOTE — Therapy (Signed)
OUTPATIENT PHYSICAL THERAPY TREATMENT   Patient Name: Randy Wilson MRN: [id number] DOB:1965-08-08, 57 y.o., male Today's Date: 04/17/2023  END OF SESSION:  PT End of Session - 04/17/23 0939     Visit Number 13    Number of Visits 30    Date for PT Re-Evaluation 05/31/23    Authorization Type BC[employer] $52 copay    Progress Note Due on Visit 20    PT Start Time [id number]   pt few minutes late   PT Stop Time 1020    PT Time Calculation (min) 41 min    Activity Tolerance Patient tolerated treatment well    Behavior During Therapy [facility] for tasks assessed/performed                       Past Medical History:  Diagnosis Date   Crohn's disease of small intestine (HCC) 2017   followed by dr h. danis;  s/p colon resection 11-02-2015,  on humira   History of asthma    child   History of colon polyps    History of COVID-19 05/30/2020   positive result in epicrunny nose/cough x 3 weeks all symptoms resolved   History of gout    last flare up 1 year ago per pt on 04-03-2021   OA (osteoarthritis)    Right wrist fracture    fell off ladder per pt on 02-08-2021   Right wrist fracture, closed, initial encounter 01/22/2021   Past Surgical History:  Procedure Laterality Date   ANKLE SURGERY Left 2002   bone spurs removed   COLONOSCOPY     last one 02-25-2020  by dr h. danis   HARDWARE REMOVAL Right 04/06/2021   Procedure: Right wrist removal of hardware;  Surgeon: [name], MD;  Location: [facility];  Service: Orthopedics;  Laterality: Right;  with local anesthesia Needs 30 minutes   KNEE CLOSED REDUCTION Left 04/11/2023   Procedure: CLOSED MANIPULATION LEFT KNEE;  Surgeon: [name], MD;  Location: [facility];  Service: Orthopedics;  Laterality: Left;   LAPAROSCOPIC ILEOCECECTOMY N/A 11/02/2015   Procedure: LAPAROSCOPIC ILEOCECECTOMY;  Surgeon: [name], MD;  Location: [facility];  Service: General;  Laterality: N/A;   ORIF WRIST  FRACTURE Right 02/09/2021   Procedure: OPEN REDUCTION INTERNAL FIXATION (ORIF) WRIST FRACTURE;  Surgeon: [name], MD;  Location: [facility];  Service: Orthopedics;  Laterality: Right;  WITH REGIONAL   ROTATOR CUFF REPAIR Bilateral    SHOULDER ARTHROSCOPY W/ SUBACROMIAL DECOMPRESSION AND DISTAL CLAVICLE EXCISION Left 05/07/2008   @[facility]  by dr [name];   bone spurs removed   TOTAL KNEE ARTHROPLASTY Left 02/22/2023   Procedure: LEFT TOTAL KNEE ARTHROPLASTY;  Surgeon: [name], MD;  Location: [facility];  Service: Orthopedics;  Laterality: Left;   Patient Active Problem List   Diagnosis Date Noted   Arthrofibrosis of left total knee arthroplasty 04/10/2023   Status post total left knee replacement 02/22/2023   Hyperpigmented skin lesion 10/09/2022   Acute pain of left knee 08/01/2022   Dyslipidemia 03/08/2022   Crohn's disease of small intestine (HCC) 03/01/2022   Residual hemorrhoidal skin tags 03/31/2020   Prediabetes 01/20/2020   Erectile dysfunction 01/20/2020   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   Asthma 04/24/2015   Hx of colonic polyp 11/08/2014   History of colonic polyps 09/17/2014    PCP: Georgina Quint, MD  REFERRING PROVIDER: [name]*  REFERRING DIAG: Q46.962 (ICD-10-CM) -  Status post total left knee replacement  Rationale for Evaluation and Treatment: Rehabilitation  THERAPY DIAG:  Localized edema  Stiffness of left knee, not elsewhere classified  Acute pain of left knee  Muscle weakness (generalized)  Other abnormalities of gait and mobility  ONSET DATE: DOS: 02/22/23   SUBJECTIVE:                                                                                                                                                                                           SUBJECTIVE STATEMENT:  Pt arrives today doing well, nothing really new since last time. Inner thigh L LE is hurting, didn't sleep well  at all last night as that spot kept hurting    PERTINENT HISTORY:  Crohn's disease, asthma, OA, hx Rt wrist fx 2022, bil RTC repair  PAIN:   NPRS scale:  2/10  Pain location: inner thigh L LE  Pain description: stretching "like a muscle or something"  Aggravating factors: nothing  Relieving factors: nothing   PRECAUTIONS:  None  RED FLAGS: None   WEIGHT BEARING RESTRICTIONS:  No  FALLS:  Has patient fallen in last 6 months? 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  PLOF:  Independent and Leisure: bowling  PATIENT GOALS:  Return to bowling, be back to normal   OBJECTIVE:   PATIENT SURVEYS:  03/13/23 FOTO 33 (predicted 62)  COGNITIVE STATUS: Within functional limits for tasks assessed   SENSATION: WFL  GAIT: 03/13/23 Distance walked: 100' within clinc Assistive device utilized: Single point cane Level of assistance: Modified independence Comments: decr stance on Lt; decr hip/knee flexion on Lt   EDEMA: 03/13/23 Knee Joint Line: Rt: 39.2 cm  / Lt: 46 cm   LOWER EXTREMITY ROM:     ROM Right 03/13/23 Left 03/13/23 Left 03/19/23 Left  03/25/23 Left 03/27/23 Left 04/03/23 Left  04/11/23 Post MUA Left 04/12/2023 Left 04/17/23  Knee flexion  A: 45 P: 50 (54 sitting with percussive device) A: 65 P: 73 AAROM seated edge of mat table 67*  A: 79  (supine) A: 82 P: 84 Seated P: 91* A: 82* After manual therapy P: 100* AROM supine heel slide 103 AROM seated 102*, AAROM 102* seated; 104* with knee flexion stretch on box   Knee extension A: 0 A: -19 (seated LAQ) P: -4 A: -10 P: -4 A: 10* seated heel propped on stool  A: -9 (seated LAQ) P: -2 Seated quad set -7* Supine  P: -4*  AAROM  -5*, AROM -20*   (Blank rows = not tested)   LOWER EXTREMITY MMT:    03/13/23: not formally tested; grossly 3-/5  MMT Right 04/01/2023 Left 04/01/2023  Knee flexion    Knee extension 5/5 95.6 lbs 4+/5 35.2, 39 lbs   (Blank rows = not tested)                  Today's Treatment                                                       DATE:    04/17/23  TherEx   Scifit bike x10 min total progressing from seat 12-->10   full rotations with cues to not compensate with hip hikes surgical LE ROM check Forward step ups 8 inch box 2x12 L LE  Knee flexion stretch on 8 inch box 12x5 second holds- to 104*  Shuttle LE press BLE 118# x20, single leg 50# 2x15 surgical LE  LAQs green TB 15x3 second holds  Seated limited ROM SLRs with focus on minimizing quad lag x10 L LE  Manual  Percussion gun L inner thigh/hip adductors- very tight and sensitive spasm noted, light vibration/pressure used today        04/15/2023 TherEx Precor bike seat 11 for first 1 min stretching & 1 min slow full revolutions, then level 1 full revolutions 6 min. Incline board straddle step gastroc stretch 30 sec x 3 reps LLE.  PT demo how to simulate with step and pt verbalized understanding.  Heel raises on incline board 10 reps 2 sets Knee flexion stretch with foot against wall 10 sec hold 3 reps 3 sets moving stance LE closer each set. Pt verbalized understanding as HEP or activity when return to work.   Leg press BLEs 125# 15 reps;  LLE only 62# 15 reps Knee ext machine BLEs concentric & eccentric 20# 10 reps;   BLE concentric & isometric / eccentric LLE 5# 10 reps   Manual Knee ext with mob belt standing on incline board. Seated Lt knee flexion  Modalities Vaso x 10 min; Lt knee; mod pressure 34 deg with elevation   Treatment                                                       DATE: 04/12/2023 TherEx UBE LE only seat 14 for 4 mins, seat 13 for 6 mins for ROM gains.  Incline gastroc stretch 30 sec  x 3 bilateral Seated Lt knee LAQ with end range pause in flexion and extension 2 x 15 5 lbs (contralateral leg moving opposite) Seated Lt leg AAROM with Rt leg overpressure 15 seconds x 6  Supine heel slide/quad set combo 5 sec each x 10    TherActivity (to improve ambulation, stair navigation) Leg press double leg 118 lbs x 15, single leg 50 lbs 2 x 15, performed bilaterally   Manual Seated Lt knee flexion c distraction/IR mobilization c movement c percussive device on on quad.   Modalities Vaso x 10 min; Lt knee; mod pressure 34 deg  Treatment                                                       DATE:  04/11/2023 Therapeutic Exercise: Sci Fit bike seat 14 with LEs only - first 1 min rocking for stretch, then level 1 for 9 min full revolutions Seated heel slide with foot on pillow case to reduce resistance - ext / quad set 5 sec and active knee flexion with end range assist contralateral LE / hamstring set 5 sec for 15 reps Supine heel slide with lower leg on 18" ball with strap flexion stretch 5 sec and ext / leg press 5 sec for 15 reps; first 5 reps PT manual assist for max flex & ext  Manual Therapy: PROM with overpressure / Grade IV mobs seated flexion Contract - relax quads with flexion stretch Supine ext mobs Grade IV  Vasopneumatic for edema 34* high compression BLEs elevated 15 min    PATIENT EDUCATION:  Education details: HEP Person educated: Patient Education method: Programmer, multimedia, Facilities manager, and Handouts Education comprehension: verbalized understanding, returned demonstration, and needs further education  HOME EXERCISE PROGRAM:  Access Code: [email or web address] URL: [email or web address] Date: 04/17/2023 Prepared by: Nedra Hai  Exercises - Long Sitting Quad Set  - 5-10 x daily - 7 x weekly - 1 sets - 5-10 reps - 5 sec hold - Supine Heel Slide with Strap  - 5-10 x daily - 7 x weekly - 1 sets - 5-10 reps - 5 sec hold - Seated Knee Extension AROM  -  5-10 x daily - 7 x weekly - 1 sets - 5-10 reps - 3-5 sec hold - Prone Quadriceps Stretch with Strap  - 1 x daily - 7 x weekly - 2 sets - 30 sec hold - Prone Knee Flexion  - 1 x daily - 7 x weekly - 2 sets - 10 reps - Sit to Stand  - 1 x daily - 7 x weekly - 2 sets - 10 reps - Seated Knee Extension with Resistance  - 1 x daily - 7 x weekly - 2 sets - 10 reps - 3 seconds  hold - Seated Small Alternating Straight Leg Lifts with Heel Touch  - 1 x daily - 7 x weekly - 2 sets - 10 reps   ASSESSMENT:  CLINICAL IMPRESSION:  Pt arrives today doing OK, having a lot more pain inner L thigh today. Warmed up and worked on ROM on [employer] today, then treated checked ROM and continued interventions for strengthening/ROM/balance today. Also incorporated manual treatments and treatments for inner thigh as tolerated and appropriate. Does endorse some knee buckling and has significant knee extension lag with LAQ- made some additions to HEP to help address. We will continue to work on quad strength moving forward.   GOALS: Goals reviewed with patient? Yes  SHORT TERM GOALS: Target date: 04/24/2023   Independent with updated HEP Goal status:  updated 04/11/2023  2.  Lt knee PROM 2-0-100 deg for improved mobility and function Goal status: updated 04/11/2023   LONG TERM GOALS: Target date: 05/30/2022  Independent with final HEP Goal status: on going 04/01/2023  2.  FOTO score improved to 62 Goal status: on going 04/01/2023  3.  Lt knee AROM improved to 5-0-95 for improved function and mobility Goal status: on going 04/01/2023  4.  Report pain < 2/10 with standing and walking activities for improved function Goal status: on going 04/01/2023  5.  Amb with LRAD modified independent without significant gait deviations for improved function Goal status: on going 04/01/2023   PLAN:  PT FREQUENCY:  5x/wk for 2 weeks following MUA, then 2x/wk for 5 weeks  PT DURATION:  7 weeks additional following  MUA  PLANNED INTERVENTIONS: 97164- PT Re-evaluation, 97110-Therapeutic exercises, 97530- Therapeutic activity, 97112- Neuromuscular re-education, 97535- Self Care, [id number]- Manual therapy, [id number]- Gait training, [phone]- Aquatic Therapy, 97014- Electrical stimulation (unattended), 97016- Vasopneumatic device, Patient/Family education, Balance training, Stair training, Taping, Dry Needling, DME instructions, Cryotherapy, and Moist heat.  PLAN FOR NEXT SESSION: measure range, continue to update HEP & pt education,  Manual and therex for mobility, strength and progressions thereof PRN. Still having issues with knee buckling and how is inner thigh feeling? Dry needling L adductor?    NEXT MD VISIT: 04/24/23    Nedra Hai, PT, DPT 04/17/23 10:26 AM

## 2023-04-18 ENCOUNTER — Ambulatory Visit (INDEPENDENT_AMBULATORY_CARE_PROVIDER_SITE_OTHER): Payer: BC Managed Care – PPO | Admitting: Physical Therapy

## 2023-04-18 ENCOUNTER — Encounter: Payer: Self-pay | Admitting: Physical Therapy

## 2023-04-18 DIAGNOSIS — R2689 Other abnormalities of gait and mobility: Secondary | ICD-10-CM | POA: Diagnosis not present

## 2023-04-18 DIAGNOSIS — M6281 Muscle weakness (generalized): Secondary | ICD-10-CM

## 2023-04-18 DIAGNOSIS — R600 Localized edema: Secondary | ICD-10-CM

## 2023-04-18 DIAGNOSIS — R2681 Unsteadiness on feet: Secondary | ICD-10-CM

## 2023-04-18 DIAGNOSIS — M25662 Stiffness of left knee, not elsewhere classified: Secondary | ICD-10-CM

## 2023-04-18 DIAGNOSIS — M25562 Pain in left knee: Secondary | ICD-10-CM | POA: Diagnosis not present

## 2023-04-18 NOTE — Therapy (Signed)
OUTPATIENT PHYSICAL THERAPY TREATMENT   Patient Name: Randy Wilson MRN: [id number] DOB:1966/01/15, 57 y.o., male Today's Date: 04/18/2023  END OF SESSION:  PT End of Session - 04/18/23 1441     Visit Number 14    Number of Visits 30    Date for PT Re-Evaluation 05/31/23    Authorization Type BCBS $52 copay    Progress Note Due on Visit 20    PT Start Time 1430    PT Stop Time 1513    PT Time Calculation (min) 43 min    Activity Tolerance Patient tolerated treatment well    Behavior During Therapy WFL for tasks assessed/performed                        Past Medical History:  Diagnosis Date   Crohn's disease of small intestine (HCC) 2017   followed by dr h. danis;  s/p colon resection 11-02-2015,  on humira   History of asthma    child   History of colon polyps    History of COVID-19 05/30/2020   positive result in epicrunny nose/cough x 3 weeks all symptoms resolved   History of gout    last flare up 1 year ago per pt on 04-03-2021   OA (osteoarthritis)    Right wrist fracture    fell off ladder per pt on 02-08-2021   Right wrist fracture, closed, initial encounter 01/22/2021   Past Surgical History:  Procedure Laterality Date   ANKLE SURGERY Left 2002   bone spurs removed   COLONOSCOPY     last one 02-25-2020  by dr h. danis   HARDWARE REMOVAL Right 04/06/2021   Procedure: Right wrist removal of hardware;  Surgeon: [name], MD;  Location: [facility];  Service: Orthopedics;  Laterality: Right;  with local anesthesia Needs 30 minutes   KNEE CLOSED REDUCTION Left 04/11/2023   Procedure: CLOSED MANIPULATION LEFT KNEE;  Surgeon: [name], MD;  Location: [facility];  Service: Orthopedics;  Laterality: Left;   LAPAROSCOPIC ILEOCECECTOMY N/A 11/02/2015   Procedure: LAPAROSCOPIC ILEOCECECTOMY;  Surgeon: [name], MD;  Location: [facility];  Service: General;  Laterality: N/A;   ORIF WRIST FRACTURE Right  02/09/2021   Procedure: OPEN REDUCTION INTERNAL FIXATION (ORIF) WRIST FRACTURE;  Surgeon: [name], MD;  Location: [facility] [place];  Service: Orthopedics;  Laterality: Right;  WITH REGIONAL   ROTATOR CUFF REPAIR Bilateral    SHOULDER ARTHROSCOPY W/ SUBACROMIAL DECOMPRESSION AND DISTAL CLAVICLE EXCISION Left 05/07/2008   @[facility]  by dr [name];   bone spurs removed   TOTAL KNEE ARTHROPLASTY Left 02/22/2023   Procedure: LEFT TOTAL KNEE ARTHROPLASTY;  Surgeon: [name], MD;  Location: [facility];  Service: Orthopedics;  Laterality: Left;   Patient Active Problem List   Diagnosis Date Noted   Arthrofibrosis of left total knee arthroplasty 04/10/2023   Status post total left knee replacement 02/22/2023   Hyperpigmented skin lesion 10/09/2022   Acute pain of left knee 08/01/2022   Dyslipidemia 03/08/2022   Crohn's disease of small intestine (HCC) 03/01/2022   Residual hemorrhoidal skin tags 03/31/2020   Prediabetes 01/20/2020   Erectile dysfunction 01/20/2020   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   Asthma 04/24/2015   Hx of colonic polyp 11/08/2014   History of colonic polyps 09/17/2014    PCP: Georgina Quint, MD  REFERRING PROVIDER: [name]*  REFERRING DIAG: T51.761 (ICD-10-CM) - Status post total left  knee replacement  Rationale for Evaluation and Treatment: Rehabilitation  THERAPY DIAG:  Stiffness of left knee, not elsewhere classified  Acute pain of left knee  Muscle weakness (generalized)  Other abnormalities of gait and mobility  Localized edema  Unsteadiness on feet  ONSET DATE: DOS: 02/22/23   SUBJECTIVE:                                                                                                                                                                                           SUBJECTIVE STATEMENT: Doing well; knee feels pretty good   PERTINENT HISTORY:  Crohn's disease, asthma, OA, hx Rt wrist fx  2022, bil RTC repair  PAIN:   NPRS scale:  2/10  Pain location: inner thigh L LE  Pain description: stretching "like a muscle or something"  Aggravating factors: nothing  Relieving factors: nothing   PRECAUTIONS:  None  RED FLAGS: None   WEIGHT BEARING RESTRICTIONS:  No  FALLS:  Has patient fallen in last 6 months? 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  PLOF:  Independent and Leisure: bowling  PATIENT GOALS:  Return to bowling, be back to normal   OBJECTIVE:   PATIENT SURVEYS:  03/13/23 FOTO 33 (predicted 62)  COGNITIVE STATUS: Within functional limits for tasks assessed   SENSATION: WFL  GAIT: 03/13/23 Distance walked: 100' within clinc Assistive device utilized: Single point cane Level of assistance: Modified independence Comments: decr stance on Lt; decr hip/knee flexion on Lt   EDEMA: 03/13/23 Knee Joint Line: Rt: 39.2 cm  / Lt: 46 cm   LOWER EXTREMITY ROM:     ROM Right 03/13/23 Left 03/13/23 Left 03/19/23 Left  03/25/23 Left 03/27/23 Left 04/03/23 Left  04/11/23 Post MUA Left 04/12/2023 Left 04/17/23 Left 04/18/23  Knee flexion  A: 45 P: 50 (54 sitting with percussive device) A: 65 P: 73 AAROM seated edge of mat table 67*  A: 79  (supine) A: 82 P: 84 Seated P: 91* A: 82* After manual therapy P: 100* AROM supine heel slide 103 AROM seated 102*, AAROM 102* seated; 104* with knee flexion stretch on box  AA: 112 (supine)  A: 108 (supine)  Knee extension A: 0 A: -19 (seated LAQ) P: -4 A: -10 P: -4 A: 10* seated heel propped on stool   A: -9 (seated LAQ) P: -2 Seated quad set -7* Supine  P: -4*  AAROM -5*, AROM -20*    (Blank rows = not tested)   LOWER EXTREMITY  MMT:    03/13/23: not formally tested; grossly 3-/5  MMT Right 04/01/2023 Left 04/01/2023  Knee flexion    Knee extension 5/5 95.6 lbs 4+/5 35.2, 39 lbs   (Blank rows = not tested)                  Today's Treatment                                                       DATE:  04/18/23 TherEx Recumbent bike x 8 min; full revolutions seat 11 - 9 Shuttle LE press BLE 125# 3x10, single leg 75# 3x10 LLE LLE LAQ 5# 3x10; 3 sec hold Sit to/from stand without UE support 2x10 LLE SLS x15 sec; then on compliant surface 5x15 sec AA heel slides 2x10 ROM measurements - see above  Modalities Vaso x 10 min; Lt knee; mod pressure 34 deg with elevation    04/17/23 TherEx Scifit bike x10 min total progressing from seat 12-->10   full rotations with cues to not compensate with hip hikes surgical LE ROM check Forward step ups 8 inch box 2x12 L LE  Knee flexion stretch on 8 inch box 12x5 second holds- to 104*  Shuttle LE press BLE 118# x20, single leg 50# 2x15 surgical LE  LAQs green TB 15x3 second holds  Seated limited ROM SLRs with focus on minimizing quad lag x10 L LE  Manual Percussion gun L inner thigh/hip adductors- very tight and sensitive spasm noted, light vibration/pressure used today      04/15/2023 TherEx Precor bike seat 11 for first 1 min stretching & 1 min slow full revolutions, then level 1 full revolutions 6 min. Incline board straddle step gastroc stretch 30 sec x 3 reps LLE.  PT demo how to simulate with step and pt verbalized understanding.  Heel raises on incline board 10 reps 2 sets Knee flexion stretch with foot against wall 10 sec hold 3 reps 3 sets moving stance LE closer each set. Pt verbalized understanding as HEP or activity when return to work.   Leg press BLEs 125# 15 reps;  LLE only 62# 15 reps Knee ext machine BLEs concentric & eccentric 20# 10 reps;   BLE concentric & isometric / eccentric LLE 5# 10 reps   Manual Knee ext with mob belt standing on  incline board. Seated Lt knee flexion  Modalities Vaso x 10 min; Lt knee; mod pressure 34 deg with elevation   PATIENT EDUCATION:  Education details: HEP Person educated: Patient Education method: Explanation, Demonstration, and Handouts Education comprehension: verbalized understanding, returned demonstration, and needs further education  HOME EXERCISE PROGRAM:  Access Code: [email or web address] URL: [email or web address] Date: 04/17/2023 Prepared by: Nedra Hai  Exercises - Long Sitting Quad Set  - 5-10 x daily - 7 x weekly - 1 sets - 5-10 reps - 5 sec hold - Supine Heel Slide with Strap  - 5-10 x daily - 7 x weekly - 1 sets - 5-10 reps - 5 sec hold - Seated Knee Extension AROM  - 5-10 x daily - 7 x weekly - 1 sets - 5-10 reps - 3-5 sec hold - Prone Quadriceps Stretch with Strap  - 1 x daily - 7 x weekly -  2 sets - 30 sec hold - Prone Knee Flexion  - 1 x daily - 7 x weekly - 2 sets - 10 reps - Sit to Stand  - 1 x daily - 7 x weekly - 2 sets - 10 reps - Seated Knee Extension with Resistance  - 1 x daily - 7 x weekly - 2 sets - 10 reps - 3 seconds  hold - Seated Small Alternating Straight Leg Lifts with Heel Touch  - 1 x daily - 7 x weekly - 2 sets - 10 reps   ASSESSMENT:  CLINICAL IMPRESSION: Good improvement in AROM noted today and overall progressing well with PT.  Will continue to benefit from PT to maximize function.   GOALS: Goals reviewed with patient? Yes  SHORT TERM GOALS: Target date: 04/24/2023   Independent with updated HEP Goal status:  updated 04/11/2023  2.  Lt knee PROM 2-0-100 deg for improved mobility and function Goal status: updated 04/11/2023   LONG TERM GOALS: Target date: 05/30/2022  Independent with final HEP Goal status: on going 04/01/2023  2.  FOTO score improved to 62 Goal status: on going 04/01/2023  3.  Lt knee AROM improved to 5-0-95 for improved function and mobility Goal status: on going 04/01/2023  4.  Report pain < 2/10  with standing and walking activities for improved function Goal status: on going 04/01/2023  5.  Amb with LRAD modified independent without significant gait deviations for improved function Goal status: on going 04/01/2023   PLAN:  PT FREQUENCY:  5x/wk for 2 weeks following MUA, then 2x/wk for 5 weeks  PT DURATION:  7 weeks additional following MUA  PLANNED INTERVENTIONS: 97164- PT Re-evaluation, 97110-Therapeutic exercises, 97530- Therapeutic activity, 97112- Neuromuscular re-education, 97535- Self Care, 16109- Manual therapy, [id number]- Gait training, [phone]- Aquatic Therapy, 97014- Electrical stimulation (unattended), 97016- Vasopneumatic device, Patient/Family education, Balance training, Stair training, Taping, Dry Needling, DME instructions, Cryotherapy, and Moist heat.  PLAN FOR NEXT SESSION: continue to update HEP & pt education,  Manual and therex for mobility, strength and progressions thereof PRN. Still having issues with knee buckling and how is inner thigh feeling? Dry needling L adductor?    NEXT MD VISIT: 04/24/23    Clarita Crane, PT, DPT 04/18/23 3:06 PM

## 2023-04-19 ENCOUNTER — Encounter: Payer: BC Managed Care – PPO | Admitting: Physical Therapy

## 2023-04-22 ENCOUNTER — Encounter: Payer: BC Managed Care – PPO | Admitting: Physical Therapy

## 2023-04-23 ENCOUNTER — Ambulatory Visit (INDEPENDENT_AMBULATORY_CARE_PROVIDER_SITE_OTHER): Payer: BC Managed Care – PPO | Admitting: Physical Therapy

## 2023-04-23 DIAGNOSIS — R2689 Other abnormalities of gait and mobility: Secondary | ICD-10-CM | POA: Diagnosis not present

## 2023-04-23 DIAGNOSIS — M25562 Pain in left knee: Secondary | ICD-10-CM | POA: Diagnosis not present

## 2023-04-23 DIAGNOSIS — M6281 Muscle weakness (generalized): Secondary | ICD-10-CM

## 2023-04-23 DIAGNOSIS — M25662 Stiffness of left knee, not elsewhere classified: Secondary | ICD-10-CM | POA: Diagnosis not present

## 2023-04-23 DIAGNOSIS — R600 Localized edema: Secondary | ICD-10-CM

## 2023-04-23 NOTE — Therapy (Signed)
OUTPATIENT PHYSICAL THERAPY TREATMENT Progress Note   Patient Name: Randy Wilson MRN: [id number] DOB:Oct 06, 1965, 57 y.o., male Today's Date: 04/23/2023   Progress Note  Reporting Period 04/11/23 to 04/23/23  See note below for Objective Data and Assessment of Progress/Goals.     END OF SESSION:  PT End of Session - 04/23/23 0929     Visit Number 15    Number of Visits 30    Date for PT Re-Evaluation 05/31/23    Progress Note Due on Visit 25   Progress note sent on 04/23/23   PT Start Time 0927    PT Stop Time 1010    PT Time Calculation (min) 43 min    Activity Tolerance Patient tolerated treatment well    Behavior During Therapy [facility] for tasks assessed/performed                         Past Medical History:  Diagnosis Date   Crohn's disease of small intestine (HCC) 2017   followed by dr h. danis;  s/p colon resection 11-02-2015,  on humira   History of asthma    child   History of colon polyps    History of COVID-19 05/30/2020   positive result in epicrunny nose/cough x 3 weeks all symptoms resolved   History of gout    last flare up 1 year ago per pt on 04-03-2021   OA (osteoarthritis)    Right wrist fracture    fell off ladder per pt on 02-08-2021   Right wrist fracture, closed, initial encounter 01/22/2021   Past Surgical History:  Procedure Laterality Date   ANKLE SURGERY Left 2002   bone spurs removed   COLONOSCOPY     last one 02-25-2020  by dr h. danis   HARDWARE REMOVAL Right 04/06/2021   Procedure: Right wrist removal of hardware;  Surgeon: [name], MD;  Location: [facility];  Service: Orthopedics;  Laterality: Right;  with local anesthesia Needs 30 minutes   KNEE CLOSED REDUCTION Left 04/11/2023   Procedure: CLOSED MANIPULATION LEFT KNEE;  Surgeon: [name], MD;  Location: [facility];  Service: Orthopedics;  Laterality: Left;   LAPAROSCOPIC ILEOCECECTOMY N/A 11/02/2015   Procedure:  LAPAROSCOPIC ILEOCECECTOMY;  Surgeon: [name], MD;  Location: [facility];  Service: General;  Laterality: N/A;   ORIF WRIST FRACTURE Right 02/09/2021   Procedure: OPEN REDUCTION INTERNAL FIXATION (ORIF) WRIST FRACTURE;  Surgeon: [name], MD;  Location:  [facility];  Service: Orthopedics;  Laterality: Right;  WITH REGIONAL   ROTATOR CUFF REPAIR Bilateral    SHOULDER ARTHROSCOPY W/ SUBACROMIAL DECOMPRESSION AND DISTAL CLAVICLE EXCISION Left 05/07/2008   @[facility]  by dr [name];   bone spurs removed   TOTAL KNEE ARTHROPLASTY Left 02/22/2023   Procedure: LEFT TOTAL KNEE ARTHROPLASTY;  Surgeon: [name], MD;  Location: [facility];  Service: Orthopedics;  Laterality: Left;   Patient Active Problem List   Diagnosis Date Noted   Arthrofibrosis of left total knee arthroplasty 04/10/2023   Status post total left knee replacement 02/22/2023   Hyperpigmented skin lesion 10/09/2022   Acute pain of left knee 08/01/2022   Dyslipidemia 03/08/2022   Crohn's disease of small intestine (HCC) 03/01/2022   Residual hemorrhoidal skin tags 03/31/2020   Prediabetes 01/20/2020   Erectile dysfunction 01/20/2020   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   Asthma 04/24/2015   Hx of colonic polyp 11/08/2014   History of colonic polyps  09/17/2014    PCP: Georgina Quint, MD  REFERRING PROVIDER: [name]*  REFERRING DIAG: W09.811 (ICD-10-CM) - Status post total left knee replacement  Rationale for Evaluation and Treatment: Rehabilitation  THERAPY DIAG:  Stiffness of left knee, not elsewhere classified  Acute pain of left knee  Muscle weakness (generalized)  Other abnormalities of gait and mobility  Localized edema  ONSET DATE: DOS: 02/22/23   SUBJECTIVE:                                                                                                                                                                                           SUBJECTIVE  STATEMENT: Pt still reporting occasional buckling of his knee. Pt reporting 1/10 pain today at rest.    PERTINENT HISTORY:  Crohn's disease, asthma, OA, hx Rt wrist fx 2022, bil RTC repair  PAIN:   NPRS scale:  1/10, 4/10 with movements and flexion Pain location: inner thigh L LE  Pain description: stretching "like a muscle or something"  Aggravating factors: nothing  Relieving factors: nothing   PRECAUTIONS:  None  RED FLAGS: None   WEIGHT BEARING RESTRICTIONS:  No  FALLS:  Has patient fallen in last 6 months? 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  PLOF:  Independent and Leisure: bowling  PATIENT GOALS:  Return to bowling, be back to normal   OBJECTIVE:   PATIENT SURVEYS:  03/13/23 FOTO 33 (predicted 62) 04/23/23: FOTO update: 63 %   COGNITIVE STATUS: Within functional limits for tasks assessed   SENSATION: WFL  GAIT: 03/13/23 Distance walked: 100' within clinc Assistive device utilized: Single point cane Level of assistance: Modified independence Comments: decr stance on Lt; decr hip/knee flexion on Lt   EDEMA: 03/13/23 Knee Joint Line: Rt: 39.2 cm  / Lt: 46 cm   LOWER EXTREMITY ROM:     ROM Right 03/13/23 Left 03/13/23 Left 03/19/23 Left  03/25/23 Left 03/27/23 Left 04/03/23 Left  04/11/23 Post MUA Left 04/12/2023 Left 04/17/23 Left 04/18/23 Left 04/23/23   Knee flexion  A: 45 P: 50 (54 sitting with percussive device) A: 65 P: 73 AAROM seated edge of mat table 67*  A: 79  (supine) A: 82 P: 84 Seated P: 91* A: 82* After manual therapy P: 100* AROM supine heel slide 103 AROM seated 102*, AAROM 102* seated; 104* with knee flexion stretch on box  AA:  112  (supine)  A: 108 (supine) AAROM: 102  A: 100   Knee extension A: 0 A: -19 (seated LAQ) P: -4 A: -10 P: -4 A: 10* seated heel propped on stool   A: -9 (seated LAQ) P: -2 Seated quad set -7* Supine  P: -4*  AAROM -5*, AROM -20*  A: -8 P: -4   (Blank rows = not tested)   LOWER EXTREMITY MMT:    03/13/23: not formally tested; grossly 3-/5  MMT Right 04/01/2023 Left 04/01/2023  Knee flexion    Knee extension 5/5 95.6 lbs 4+/5 35.2, 39 lbs   (Blank rows = not tested)                  Today's Treatment                                                       DATE:   04/23/23 TherEx Recumbent bike x 8 min; full revolutions seat 11 - 9 Shuttle LE press BLE 150# 3x10, single leg 81# 3x10 LLE LLE LAQ 5# 3x10; 3 sec hold Sit to/from stand without UE support 2x10 Standing TKE green TB 2 x 10 holding 3 sec Seated SLR: 4# 2 x 10 (pt needed cues for locking out  AAROM heel slides with strap and ball under foot x 20 holding stretch at end range Modalities Vaso x 10 min; Lt knee; mod pressure 34 deg with elevation    04/18/23 TherEx Recumbent bike x 8 min; full revolutions seat 11 - 9 Shuttle LE press BLE 125# 3x10, single leg 75# 3x10 LLE LLE LAQ 5# 3x10; 3 sec hold Sit to/from stand without UE support 2x10 LLE SLS x15 sec; then on compliant surface 5x15 sec AA heel slides 2x10 ROM measurements - see above  Modalities Vaso x 10 min; Lt knee; mod pressure 34 deg with elevation    04/17/23 TherEx Scifit bike x10 min total progressing from seat 12-->10   full rotations with cues to not compensate with hip hikes surgical LE ROM check Forward step ups 8 inch box 2x12 L LE  Knee flexion stretch on 8 inch box 12x5 second holds- to 104*  Shuttle LE press BLE 118# x20, single leg 50# 2x15 surgical LE  LAQs green TB 15x3 second holds  Seated limited ROM SLRs with focus on minimizing quad lag x10 L LE  Manual Percussion gun L inner thigh/hip adductors- very tight and sensitive  spasm noted, light vibration/pressure used today         PATIENT EDUCATION:  Education details: HEP Person educated: Patient Education method: Programmer, multimedia, Facilities manager, and Handouts Education comprehension: verbalized understanding, returned demonstration, and needs further education  HOME EXERCISE PROGRAM:  Access Code: [email or web address] URL: [email or web address] Date: 04/17/2023 Prepared by: Nedra Hai  Exercises - Long Sitting Quad Set  - 5-10 x daily - 7 x weekly - 1 sets - 5-10 reps - 5 sec hold - Supine Heel Slide with Strap  - 5-10 x daily - 7 x weekly - 1 sets - 5-10 reps - 5 sec hold - Seated Knee Extension AROM  - 5-10 x daily - 7 x weekly - 1 sets - 5-10 reps - 3-5 sec hold - Prone Quadriceps Stretch with Strap  - 1 x daily - 7 x weekly - 2 sets - 30 sec  hold - Prone Knee Flexion  - 1 x daily - 7 x weekly - 2 sets - 10 reps - Sit to Stand  - 1 x daily - 7 x weekly - 2 sets - 10 reps - Seated Knee Extension with Resistance  - 1 x daily - 7 x weekly - 2 sets - 10 reps - 3 seconds  hold - Seated Small Alternating Straight Leg Lifts with Heel Touch  - 1 x daily - 7 x weekly - 2 sets - 10 reps   ASSESSMENT:  CLINICAL IMPRESSION: Pt tolerating exercises well. Pt with improved FOTO score of 63% up from 33% at initial evaluation. Pt still progressing with left quad strength and overall functional mobility. Pt's ROM digressed from last visit due to pt not feeling well and he reported he hasn't been working his flexion as much. Pt agreeing to focus on improved ROM and overall strengthening with HEP. Recommending continued skilled PT interventions.    GOALS: Goals reviewed with patient? Yes  SHORT TERM GOALS: Target date: 04/24/2023   Independent with updated HEP Goal status:  updated 04/11/2023  2.  Lt knee PROM 2-0-100 deg for improved mobility and function Goal status: updated 04/11/2023   LONG TERM GOALS: Target date: 05/30/2022  Independent with final  HEP Goal status: on going 04/01/2023  2.  FOTO score improved to 62 Goal status: MET 63% 04/23/23  3.  Lt knee AROM improved to 5-0-95 for improved function and mobility Goal status: on going 04/01/2023  4.  Report pain < 2/10 with standing and walking activities for improved function Goal status: on going 04/01/2023  5.  Amb with LRAD modified independent without significant gait deviations for improved function Goal status: on going 04/01/2023   PLAN:  PT FREQUENCY:  5x/wk for 2 weeks following MUA, then 2x/wk for 5 weeks  PT DURATION:  7 weeks additional following MUA  PLANNED INTERVENTIONS: 97164- PT Re-evaluation, 97110-Therapeutic exercises, 97530- Therapeutic activity, 97112- Neuromuscular re-education, 97535- Self Care, 21308- Manual therapy, [id number]- Gait training, [phone]- Aquatic Therapy, 97014- Electrical stimulation (unattended), 97016- Vasopneumatic device, Patient/Family education, Balance training, Stair training, Taping, Dry Needling, DME instructions, Cryotherapy, and Moist heat.  PLAN FOR NEXT SESSION: continue to update HEP & pt education,  Manual and therex for mobility, strength and progressions thereof PRN.  Still having occasional knee buckling, is it improving?   NEXT MD VISIT: 04/24/23  Narda Amber, PT, MPT 04/23/23 10:09 AM   04/23/23 10:09 AM

## 2023-04-24 ENCOUNTER — Encounter: Payer: Self-pay | Admitting: Orthopaedic Surgery

## 2023-04-24 ENCOUNTER — Ambulatory Visit (INDEPENDENT_AMBULATORY_CARE_PROVIDER_SITE_OTHER): Payer: BC Managed Care – PPO | Admitting: Physical Therapy

## 2023-04-24 ENCOUNTER — Encounter: Payer: Self-pay | Admitting: Physical Therapy

## 2023-04-24 ENCOUNTER — Ambulatory Visit: Payer: BC Managed Care – PPO | Admitting: Orthopaedic Surgery

## 2023-04-24 DIAGNOSIS — M6281 Muscle weakness (generalized): Secondary | ICD-10-CM

## 2023-04-24 DIAGNOSIS — R2689 Other abnormalities of gait and mobility: Secondary | ICD-10-CM | POA: Diagnosis not present

## 2023-04-24 DIAGNOSIS — R2681 Unsteadiness on feet: Secondary | ICD-10-CM

## 2023-04-24 DIAGNOSIS — R600 Localized edema: Secondary | ICD-10-CM

## 2023-04-24 DIAGNOSIS — M25562 Pain in left knee: Secondary | ICD-10-CM

## 2023-04-24 DIAGNOSIS — M25662 Stiffness of left knee, not elsewhere classified: Secondary | ICD-10-CM | POA: Diagnosis not present

## 2023-04-24 DIAGNOSIS — Z96652 Presence of left artificial knee joint: Secondary | ICD-10-CM

## 2023-04-24 NOTE — Therapy (Signed)
OUTPATIENT PHYSICAL THERAPY TREATMENT   Patient Name: Randy Wilson MRN: [id number] DOB:[birth year], 57 y.o., male Today's Date: 04/24/2023      END OF SESSION:  PT End of Session - 04/24/23 0931     Visit Number 16    Number of Visits 30    Date for PT Re-Evaluation 05/31/23    Authorization Type [employer] $52 copay    Progress Note Due on Visit 25   Progress note sent on 04/23/23   PT Start Time 0930    PT Stop Time 1008    PT Time Calculation (min) 38 min    Activity Tolerance Patient tolerated treatment well    Behavior During Therapy [facility] for tasks assessed/performed                          Past Medical History:  Diagnosis Date   Crohn's disease of small intestine (HCC) 2017   followed by dr h. danis;  s/p colon resection 11-02-2015,  on humira   History of asthma    child   History of colon polyps    History of COVID-19 05/30/2020   positive result in epicrunny nose/cough x 3 weeks all symptoms resolved   History of gout    last flare up 1 year ago per pt on 04-03-2021   OA (osteoarthritis)    Right wrist fracture    fell off ladder per pt on 02-08-2021   Right wrist fracture, closed, initial encounter 01/22/2021   Past Surgical History:  Procedure Laterality Date   ANKLE SURGERY Left 2002   bone spurs removed   COLONOSCOPY     last one 02-25-2020  by dr h. danis   HARDWARE REMOVAL Right 04/06/2021   Procedure: Right wrist removal of hardware;  Surgeon: [name], MD;  Location: [facility];  Service: Orthopedics;  Laterality: Right;  with local anesthesia Needs 30 minutes   KNEE CLOSED REDUCTION Left 04/11/2023   Procedure: CLOSED MANIPULATION LEFT KNEE;  Surgeon: [name], MD;  Location: [facility];  Service: Orthopedics;  Laterality: Left;   LAPAROSCOPIC ILEOCECECTOMY N/A 11/02/2015   Procedure: LAPAROSCOPIC ILEOCECECTOMY;  Surgeon: [name], MD;  Location: [facility];  Service: General;   Laterality: N/A;   ORIF WRIST FRACTURE Right 02/09/2021   Procedure: OPEN REDUCTION INTERNAL FIXATION (ORIF) WRIST FRACTURE;  Surgeon: [name], MD;  Location: [facility];  Service: Orthopedics;  Laterality: Right;  WITH REGIONAL   ROTATOR CUFF REPAIR Bilateral    SHOULDER ARTHROSCOPY W/ SUBACROMIAL DECOMPRESSION AND DISTAL CLAVICLE EXCISION Left 05/07/2008   @[facility]  by dr [name];   bone spurs removed   TOTAL KNEE ARTHROPLASTY Left 02/22/2023   Procedure: LEFT TOTAL KNEE ARTHROPLASTY;  Surgeon: [name], MD;  Location: [facility];  Service: Orthopedics;  Laterality: Left;   Patient Active Problem List   Diagnosis Date Noted   Arthrofibrosis of left total knee arthroplasty 04/10/2023   Status post total left knee replacement 02/22/2023   Hyperpigmented skin lesion 10/09/2022   Acute pain of left knee 08/01/2022   Dyslipidemia 03/08/2022   Crohn's disease of small intestine (HCC) 03/01/2022   Residual hemorrhoidal skin tags 03/31/2020   Prediabetes 01/20/2020   Erectile dysfunction 01/20/2020   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   Asthma 04/24/2015   Hx of colonic polyp 11/08/2014   History of colonic polyps 09/17/2014    PCP: Georgina Quint, MD  REFERRING PROVIDER: Magnus Ivan,  Vanita Panda*  REFERRING DIAG: [phone] (ICD-10-CM) - Status post total left knee replacement  Rationale for Evaluation and Treatment: Rehabilitation  THERAPY DIAG:  Stiffness of left knee, not elsewhere classified  Acute pain of left knee  Muscle weakness (generalized)  Other abnormalities of gait and mobility  Localized edema  Unsteadiness on feet  ONSET DATE: DOS: 02/22/23   SUBJECTIVE:                                                                                                                                                                                           SUBJECTIVE STATEMENT: Doing pretty well, knee isn't buckling like it was    PERTINENT HISTORY:  Crohn's disease, asthma, OA, hx Rt wrist fx 2022, bil RTC repair  PAIN:   NPRS scale:  1/10, 4/10 with movements and flexion Pain location: inner thigh L LE  Pain description: stretching "like a muscle or something"  Aggravating factors: nothing  Relieving factors: nothing   PRECAUTIONS:  None  RED FLAGS: None   WEIGHT BEARING RESTRICTIONS:  No  FALLS:  Has patient fallen in last 6 months? 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  PLOF:  Independent and Leisure: bowling  PATIENT GOALS:  Return to bowling, be back to normal   OBJECTIVE:   PATIENT SURVEYS:  03/13/23 FOTO 33 (predicted 62) 04/23/23: FOTO update: 63 %   COGNITIVE STATUS: Within functional limits for tasks assessed   SENSATION: WFL  GAIT: 03/13/23 Distance walked: 100' within clinc Assistive device utilized: Single point cane Level of assistance: Modified independence Comments: decr stance on Lt; decr hip/knee flexion on Lt   EDEMA: 03/13/23 Knee Joint Line: Rt: 39.2 cm  / Lt: 46 cm   LOWER EXTREMITY ROM:     ROM Right 03/13/23 Left 03/13/23 Left 03/19/23 Left  03/25/23 Left 03/27/23 Left 04/03/23 Left  04/11/23 Post MUA Left 04/12/2023 Left 04/17/23 Left 04/18/23 Left 04/23/23  Left 04/24/23  Knee flexion  A: 45 P: 50 (54 sitting with percussive device) A: 65 P: 73 AAROM seated edge of mat table 67*  A: 79  (supine) A: 82 P: 84 Seated P: 91* A: 82* After manual therapy P: 100* AROM supine heel slide 103 AROM seated 102*, AAROM 102* seated; 104* with knee flexion stretch on box  AA: 112 (supine)  A: 108 (supine) AAROM: 102  A: 100  AA: 115  A: 113   Knee extension A: 0 A: -19  (seated LAQ) P: -4 A: -10 P: -4 A: 10* seated heel propped on stool   A: -9 (seated LAQ) P: -2 Seated quad set -7* Supine  P: -4*  AAROM -5*, AROM -20*  A: -8 P: -4 A: -7 P: 0   (Blank rows = not tested)   LOWER EXTREMITY MMT:    03/13/23: not formally tested; grossly 3-/5  MMT Right 04/01/2023 Left 04/01/2023  Knee flexion    Knee extension 5/5 95.6 lbs 4+/5 35.2, 39 lbs   (Blank rows = not tested)                  Today's Treatment                                                       DATE:  04/24/23 TherEx Recumbent bike L14-9 x 8 min; full revolutions seat 11 - 8 Shuttle LE press BLE 150# 3x10, single leg 81# 3x10 LLE Seated LAQ 5# on Lt 3x10; 5 sec hold ROM measurements - see above AA heel slides 2x10 BATCA knee extension 2x15; LLE only 10# BATCA hamstring curl 2x15; LLE only 20# Calf raises x20 reps; no UE support LLE SLS 8, 20, 40 sec without UE support   04/23/23 TherEx Recumbent bike x 8 min; full revolutions seat 11 - 9 Shuttle LE press BLE 150# 3x10, single leg 81# 3x10 LLE LLE LAQ 5# 3x10; 3 sec hold Sit to/from stand without UE support 2x10 Standing TKE green TB 2 x 10 holding 3 sec Seated SLR: 4# 2 x 10 (pt needed cues for locking out  AAROM heel slides with strap and ball under foot x 20 holding stretch at end range  Modalities Vaso x 10 min; Lt knee; mod pressure 34 deg with elevation    04/18/23 TherEx Recumbent bike x 8 min; full revolutions seat 11 - 9 Shuttle LE press BLE 125# 3x10, single leg 75# 3x10 LLE LLE LAQ 5# 3x10; 3 sec hold Sit to/from stand without UE support 2x10 LLE SLS x15 sec; then on compliant surface 5x15 sec AA heel slides 2x10 ROM measurements - see above  Modalities Vaso x 10 min; Lt knee; mod pressure 34 deg with elevation    04/17/23 TherEx Scifit bike x10 min total progressing from seat 12-->10   full rotations with cues to not compensate with hip hikes surgical LE ROM check Forward step ups 8 inch box  2x12 L LE  Knee flexion stretch on 8 inch box 12x5 second holds- to 104*  Shuttle LE press BLE 118# x20, single leg 50# 2x15 surgical LE  LAQs green TB 15x3 second holds  Seated limited ROM SLRs with focus on minimizing quad lag x10 L LE  Manual Percussion gun L inner thigh/hip adductors- very tight and sensitive spasm noted, light vibration/pressure used today         PATIENT EDUCATION:  Education details: HEP Person educated: Patient Education method: Programmer, multimedia, Facilities manager, and Handouts Education comprehension: verbalized understanding, returned demonstration, and needs further education  HOME EXERCISE PROGRAM:  Access Code: [email or web address] URL: [email or web address] Date: 04/17/2023 Prepared by: Nedra Hai  Exercises - Long Sitting Quad Set  - 5-10 x daily - 7 x weekly - 1 sets - 5-10 reps - 5 sec hold -  Supine Heel Slide with Strap  - 5-10 x daily - 7 x weekly - 1 sets - 5-10 reps - 5 sec hold - Seated Knee Extension AROM  - 5-10 x daily - 7 x weekly - 1 sets - 5-10 reps - 3-5 sec hold - Prone Quadriceps Stretch with Strap  - 1 x daily - 7 x weekly - 2 sets - 30 sec hold - Prone Knee Flexion  - 1 x daily - 7 x weekly - 2 sets - 10 reps - Sit to Stand  - 1 x daily - 7 x weekly - 2 sets - 10 reps - Seated Knee Extension with Resistance  - 1 x daily - 7 x weekly - 2 sets - 10 reps - 3 seconds  hold - Seated Small Alternating Straight Leg Lifts with Heel Touch  - 1 x daily - 7 x weekly - 2 sets - 10 reps   ASSESSMENT:  CLINICAL IMPRESSION: Good improvement in ROM noted today compared to yesterday.  Anticipate we are nearing d/c with PT so plan to use next 1-2 weeks to address any remaining functional limitations.  Will continue to benefit from PT to maximize function.   GOALS: Goals reviewed with patient? Yes  SHORT TERM GOALS: Target date: 04/24/2023   Independent with updated HEP Goal status:  updated 04/11/2023  2.  Lt knee PROM 2-0-100 deg for  improved mobility and function Goal status: updated 04/11/2023   LONG TERM GOALS: Target date: 05/30/2022  Independent with final HEP Goal status: on going 04/01/2023  2.  FOTO score improved to 62 Goal status: MET 63% 04/23/23  3.  Lt knee AROM improved to 5-0-95 for improved function and mobility Goal status: on going 04/01/2023  4.  Report pain < 2/10 with standing and walking activities for improved function Goal status: on going 04/01/2023  5.  Amb with LRAD modified independent without significant gait deviations for improved function Goal status: on going 04/01/2023   PLAN:  PT FREQUENCY:  5x/wk for 2 weeks following MUA, then 2x/wk for 5 weeks  PT DURATION:  7 weeks additional following MUA  PLANNED INTERVENTIONS: 97164- PT Re-evaluation, 97110-Therapeutic exercises, 97530- Therapeutic activity, 97112- Neuromuscular re-education, 97535- Self Care, 16109- Manual therapy, [id number]- Gait training, [phone]- Aquatic Therapy, 97014- Electrical stimulation (unattended), 97016- Vasopneumatic device, Patient/Family education, Balance training, Stair training, Taping, Dry Needling, DME instructions, Cryotherapy, and Moist heat.  PLAN FOR NEXT SESSION: give gym program, begin working on d/c planning and transition to HEP and community fitness.    NEXT MD VISIT: 04/24/23  Clarita Crane, PT, DPT 04/24/23 10:10 AM

## 2023-04-24 NOTE — Progress Notes (Signed)
The patient is now just past 8 weeks status post a left total knee replacement.  His postoperative course was complicated by arthrofibrosis and 2 weeks ago we took him to the operating room and performed a manipulation under anesthesia.  He says his knee is still swollen but he is really pushed hard through physical therapy and getting his motion back.  He is walking without assistive device.  He is only 57 years old.  On exam his extension is almost full of his left knee and his flexion is to past to 100 degrees.  Overall he looks much improved.  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  I gave him a note to keep him out of work until June 03, 2023 when he can go back to full work without restrictions.  The next time I need to see him is not for 3 months.  Will have a standing AP and lateral of his left operative knee at that visit.  If there are issues before then he knows to let us know.

## 2023-04-29 ENCOUNTER — Ambulatory Visit (INDEPENDENT_AMBULATORY_CARE_PROVIDER_SITE_OTHER): Payer: BC Managed Care – PPO | Admitting: Physical Therapy

## 2023-04-29 ENCOUNTER — Encounter: Payer: Self-pay | Admitting: Physical Therapy

## 2023-04-29 DIAGNOSIS — M25562 Pain in left knee: Secondary | ICD-10-CM | POA: Diagnosis not present

## 2023-04-29 DIAGNOSIS — M6281 Muscle weakness (generalized): Secondary | ICD-10-CM | POA: Diagnosis not present

## 2023-04-29 DIAGNOSIS — R600 Localized edema: Secondary | ICD-10-CM

## 2023-04-29 DIAGNOSIS — R2689 Other abnormalities of gait and mobility: Secondary | ICD-10-CM | POA: Diagnosis not present

## 2023-04-29 DIAGNOSIS — R2681 Unsteadiness on feet: Secondary | ICD-10-CM

## 2023-04-29 DIAGNOSIS — M25662 Stiffness of left knee, not elsewhere classified: Secondary | ICD-10-CM

## 2023-04-29 NOTE — Therapy (Signed)
OUTPATIENT PHYSICAL THERAPY TREATMENT   Patient Name: Randy Wilson MRN: [id number] DOB:19-Dec-1965, 57 y.o., male Today's Date: 04/29/2023      END OF SESSION:  PT End of Session - 04/29/23 0934     Visit Number 17    Number of Visits 30    Date for PT Re-Evaluation 05/31/23    Authorization Type [employer] $52 copay    Progress Note Due on Visit 25   Progress note sent on 04/23/23   PT Start Time 0930    PT Stop Time 1008    PT Time Calculation (min) 38 min    Activity Tolerance Patient tolerated treatment well    Behavior During Therapy [facility] for tasks assessed/performed                           Past Medical History:  Diagnosis Date   Crohn's disease of small intestine (HCC) 2017   followed by dr h. danis;  s/p colon resection 11-02-2015,  on humira   History of asthma    child   History of colon polyps    History of COVID-19 05/30/2020   positive result in epicrunny nose/cough x 3 weeks all symptoms resolved   History of gout    last flare up 1 year ago per pt on 04-03-2021   OA (osteoarthritis)    Right wrist fracture    fell off ladder per pt on 02-08-2021   Right wrist fracture, closed, initial encounter 01/22/2021   Past Surgical History:  Procedure Laterality Date   ANKLE SURGERY Left 2002   bone spurs removed   COLONOSCOPY     last one 02-25-2020  by dr h. danis   HARDWARE REMOVAL Right 04/06/2021   Procedure: Right wrist removal of hardware;  Surgeon: [name], MD;  Location: [facility];  Service: Orthopedics;  Laterality: Right;  with local anesthesia Needs 30 minutes   KNEE CLOSED REDUCTION Left 04/11/2023   Procedure: CLOSED MANIPULATION LEFT KNEE;  Surgeon: [name], MD;  Location: [facility];  Service: Orthopedics;  Laterality: Left;   LAPAROSCOPIC ILEOCECECTOMY N/A 11/02/2015   Procedure: LAPAROSCOPIC ILEOCECECTOMY;  Surgeon: [name], MD;  Location: [facility];  Service: General;   Laterality: N/A;   ORIF WRIST FRACTURE Right 02/09/2021   Procedure: OPEN REDUCTION INTERNAL FIXATION (ORIF) WRIST FRACTURE;  Surgeon: [name], MD;  Location: [facility];  Service: Orthopedics;  Laterality: Right;  WITH REGIONAL   ROTATOR CUFF REPAIR Bilateral    SHOULDER ARTHROSCOPY W/ SUBACROMIAL DECOMPRESSION AND DISTAL CLAVICLE EXCISION Left 05/07/2008   @[facility]  by dr [name];   bone spurs removed   TOTAL KNEE ARTHROPLASTY Left 02/22/2023   Procedure: LEFT TOTAL KNEE ARTHROPLASTY;  Surgeon: [name], MD;  Location: [facility];  Service: Orthopedics;  Laterality: Left;   Patient Active Problem List   Diagnosis Date Noted   Arthrofibrosis of left total knee arthroplasty 04/10/2023   Status post total left knee replacement 02/22/2023   Hyperpigmented skin lesion 10/09/2022   Acute pain of left knee 08/01/2022   Dyslipidemia 03/08/2022   Crohn's disease of small intestine (HCC) 03/01/2022   Residual hemorrhoidal skin tags 03/31/2020   Prediabetes 01/20/2020   Erectile dysfunction 01/20/2020   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   Asthma 04/24/2015   Hx of colonic polyp 11/08/2014   History of colonic polyps 09/17/2014    PCP: Georgina Quint, MD  REFERRING PROVIDER:  [name]*  REFERRING DIAG: [id number] (ICD-10-CM) - Status post total left knee replacement  Rationale for Evaluation and Treatment: Rehabilitation  THERAPY DIAG:  Stiffness of left knee, not elsewhere classified  Acute pain of left knee  Muscle weakness (generalized)  Other abnormalities of gait and mobility  Localized edema  Unsteadiness on feet  ONSET DATE: DOS: 02/22/23   SUBJECTIVE:                                                                                                                                                                                           SUBJECTIVE STATEMENT: Out of work until 1/6; MD pleased with progress    PERTINENT HISTORY:  Crohn's disease, asthma, OA, hx Rt wrist fx 2022, bil RTC repair  PAIN:   NPRS scale:  1/10, 4/10 with movements and flexion Pain location: inner thigh L LE  Pain description: stretching "like a muscle or something"  Aggravating factors: nothing  Relieving factors: nothing   PRECAUTIONS:  None  RED FLAGS: None   WEIGHT BEARING RESTRICTIONS:  No  FALLS:  Has patient fallen in last 6 months? 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  PLOF:  Independent and Leisure: bowling  PATIENT GOALS:  Return to bowling, be back to normal   OBJECTIVE:   PATIENT SURVEYS:  03/13/23 FOTO 33 (predicted 62) 04/23/23: FOTO update: 63 %   COGNITIVE STATUS: Within functional limits for tasks assessed   SENSATION: WFL  GAIT: 03/13/23 Distance walked: 100' within clinc Assistive device utilized: Single point cane Level of assistance: Modified independence Comments: decr stance on Lt; decr hip/knee flexion on Lt   EDEMA: 03/13/23 Knee Joint Line: Rt: 39.2 cm  / Lt: 46 cm   LOWER EXTREMITY ROM:     ROM Right 03/13/23 Left 03/13/23 Left 03/19/23 Left  03/25/23 Left 03/27/23 Left 04/03/23 Left  04/11/23 Post MUA Left 04/12/2023 Left 04/17/23 Left 04/18/23 Left 04/23/23  Left 04/24/23  Knee flexion  A: 45 P: 50 (54 sitting with percussive device) A: 65 P: 73 AAROM seated edge of mat table 67*  A: 79  (supine) A: 82 P: 84 Seated P: 91* A: 82* After manual therapy P: 100* AROM supine heel slide 103 AROM seated 102*, AAROM 102* seated; 104* with knee flexion stretch on box  AA: 112 (supine)  A: 108 (supine) AAROM: 102  A: 100  AA:  115  A: 113   Knee extension A: 0 A: -19  (seated LAQ) P: -4 A: -10 P: -4 A: 10* seated heel propped on stool   A: -9 (seated LAQ) P: -2 Seated quad set -7* Supine  P: -4*  AAROM -5*, AROM -20*  A: -8 P: -4 A: -7 P: 0   (Blank rows = not tested)   LOWER EXTREMITY MMT:    03/13/23: not formally tested; grossly 3-/5  MMT Right 04/01/2023 Left 04/01/2023  Knee flexion    Knee extension 5/5 95.6 lbs 4+/5 35.2, 39 lbs   (Blank rows = not tested)                  Today's Treatment                                                       DATE:  04/29/23 TherEx Recumbent bike L10 x 8 min; full revolutions seat 10 - 7 Shuttle LE press BLE 162# 3x10, single leg 87# 3x10 LLE BATCA knee extension 3x10; LLE only 10# BATCA hamstring curl 3x10; LLE only 20# TRX squats 3x10 Incline board calf stretch 3x30 sec Calf raises 3x10 RDL 15# KB 3x10 SLS 20-45 sec  04/24/23 TherEx Recumbent bike L14-9 x 8 min; full revolutions seat 11 - 8 Shuttle LE press BLE 150# 3x10, single leg 81# 3x10 LLE Seated LAQ 5# on Lt 3x10; 5 sec hold ROM measurements - see above AA heel slides 2x10 BATCA knee extension 2x15; LLE only 10# BATCA hamstring curl 2x15; LLE only 20# Calf raises x20 reps; no UE support LLE SLS 8, 20, 40 sec without UE support   04/23/23 TherEx Recumbent bike x 8 min; full revolutions seat 11 - 9 Shuttle LE press BLE 150# 3x10, single leg 81# 3x10 LLE LLE LAQ 5# 3x10; 3 sec hold Sit to/from stand without UE support 2x10 Standing TKE green TB 2 x 10 holding 3 sec Seated SLR: 4# 2 x 10 (pt needed cues for locking out  AAROM heel slides with strap and ball under foot x 20 holding stretch at end range  Modalities Vaso x 10 min; Lt knee; mod pressure 34 deg with elevation     PATIENT EDUCATION:  Education details: HEP Person educated: Patient Education method: Explanation, Demonstration, and Handouts Education comprehension: verbalized understanding, returned demonstration, and needs further education  HOME  EXERCISE PROGRAM:  Access Code: [email or web address] URL: [email or web address] Date: 04/17/2023 Prepared by: Nedra Hai  Exercises - Long Sitting Quad Set  - 5-10 x daily - 7 x weekly - 1 sets - 5-10 reps - 5 sec hold - Supine Heel Slide with Strap  - 5-10 x daily - 7 x weekly - 1 sets - 5-10 reps - 5 sec hold - Seated Knee Extension AROM  - 5-10 x daily - 7 x weekly - 1 sets - 5-10 reps - 3-5 sec hold - Prone Quadriceps Stretch with Strap  - 1 x daily - 7 x weekly - 2 sets - 30 sec hold - Prone Knee Flexion  - 1 x daily - 7 x weekly - 2 sets - 10 reps - Sit to Stand  - 1 x daily - 7 x weekly - 2 sets - 10 reps - Seated Knee Extension with Resistance  - 1 x daily -  7 x weekly - 2 sets - 10 reps - 3 seconds  hold - Seated Small Alternating Straight Leg Lifts with Heel Touch  - 1 x daily - 7 x weekly - 2 sets - 10 reps   ASSESSMENT:  CLINICAL IMPRESSION: Pt tolerated session well today working on progressing balance and strengthening.  Plan to transition to HEP and gym program in the next few visits.  GOALS: Goals reviewed with patient? Yes  SHORT TERM GOALS: Target date: 04/24/2023   Independent with updated HEP Goal status:  updated 04/11/2023  2.  Lt knee PROM 2-0-100 deg for improved mobility and function Goal status: updated 04/11/2023   LONG TERM GOALS: Target date: 05/30/2022  Independent with final HEP Goal status: on going 04/01/2023  2.  FOTO score improved to 62 Goal status: MET 63% 04/23/23  3.  Lt knee AROM improved to 5-0-95 for improved function and mobility Goal status: on going 04/01/2023  4.  Report pain < 2/10 with standing and walking activities for improved function Goal status: on going 04/01/2023  5.  Amb with LRAD modified independent without significant gait deviations for improved function Goal status: on going 04/01/2023   PLAN:  PT FREQUENCY:  5x/wk for 2 weeks following MUA, then 2x/wk for 5 weeks  PT DURATION:  7 weeks additional  following MUA  PLANNED INTERVENTIONS: 97164- PT Re-evaluation, 97110-Therapeutic exercises, 97530- Therapeutic activity, 97112- Neuromuscular re-education, 97535- Self Care, 91478- Manual therapy, [id number]- Gait training, [phone]- Aquatic Therapy, 97014- Electrical stimulation (unattended), 97016- Vasopneumatic device, Patient/Family education, Balance training, Stair training, Taping, Dry Needling, DME instructions, Cryotherapy, and Moist heat.  PLAN FOR NEXT SESSION: give gym/home program, begin working on d/c planning and transition to HEP and community fitness.    NEXT MD VISIT: 04/24/23  Clarita Crane, PT, DPT 04/29/23 10:09 AM

## 2023-05-01 ENCOUNTER — Encounter: Payer: Self-pay | Admitting: Physical Therapy

## 2023-05-01 ENCOUNTER — Ambulatory Visit: Payer: BC Managed Care – PPO | Admitting: Physical Therapy

## 2023-05-01 ENCOUNTER — Ambulatory Visit: Payer: BC Managed Care – PPO

## 2023-05-01 VITALS — BP 150/77 | HR 73 | Temp 98.3°F | Resp 18 | Ht 73.0 in | Wt 225.0 lb

## 2023-05-01 DIAGNOSIS — M25662 Stiffness of left knee, not elsewhere classified: Secondary | ICD-10-CM | POA: Diagnosis not present

## 2023-05-01 DIAGNOSIS — R2689 Other abnormalities of gait and mobility: Secondary | ICD-10-CM

## 2023-05-01 DIAGNOSIS — R2681 Unsteadiness on feet: Secondary | ICD-10-CM

## 2023-05-01 DIAGNOSIS — M25562 Pain in left knee: Secondary | ICD-10-CM | POA: Diagnosis not present

## 2023-05-01 DIAGNOSIS — K5000 Crohn's disease of small intestine without complications: Secondary | ICD-10-CM | POA: Diagnosis not present

## 2023-05-01 DIAGNOSIS — M6281 Muscle weakness (generalized): Secondary | ICD-10-CM

## 2023-05-01 DIAGNOSIS — R600 Localized edema: Secondary | ICD-10-CM

## 2023-05-01 MED ORDER — RISANKIZUMAB-RZAA 600 MG/10ML IV SOLN
600.0000 mg | Freq: Once | INTRAVENOUS | Status: AC
  Administered 2023-05-01: 600 mg via INTRAVENOUS
  Filled 2023-05-01: qty 10

## 2023-05-01 NOTE — Therapy (Signed)
OUTPATIENT PHYSICAL THERAPY TREATMENT   Patient Name: Randy Wilson MRN: [id number] DOB:14-Apr-1966, 57 y.o., male Today's Date: 05/01/2023      END OF SESSION:  PT End of Session - 05/01/23 0923     Visit Number 18    Number of Visits 30    Date for PT Re-Evaluation 05/31/23    Authorization Type [employer] $52 copay    Progress Note Due on Visit 25   Progress note sent on 04/23/23   PT Start Time 0927    PT Stop Time 1007    PT Time Calculation (min) 40 min    Activity Tolerance Patient tolerated treatment well    Behavior During Therapy [facility] for tasks assessed/performed                Past Medical History:  Diagnosis Date   Crohn's disease of small intestine (HCC) 2017   followed by dr h. danis;  s/p colon resection 11-02-2015,  on humira   History of asthma    child   History of colon polyps    History of COVID-19 05/30/2020   positive result in epicrunny nose/cough x 3 weeks all symptoms resolved   History of gout    last flare up 1 year ago per pt on 04-03-2021   OA (osteoarthritis)    Right wrist fracture    fell off ladder per pt on 02-08-2021   Right wrist fracture, closed, initial encounter 01/22/2021   Past Surgical History:  Procedure Laterality Date   ANKLE SURGERY Left 2002   bone spurs removed   COLONOSCOPY     last one 02-25-2020  by dr h. danis   HARDWARE REMOVAL Right 04/06/2021   Procedure: Right wrist removal of hardware;  Surgeon: [name], MD;  Location: [facility] [place];  Service: Orthopedics;  Laterality: Right;  with local anesthesia Needs 30 minutes   KNEE CLOSED REDUCTION Left 04/11/2023   Procedure: CLOSED MANIPULATION LEFT KNEE;  Surgeon: [name], MD;  Location: [facility];  Service: Orthopedics;  Laterality: Left;   LAPAROSCOPIC ILEOCECECTOMY N/A 11/02/2015   Procedure: LAPAROSCOPIC ILEOCECECTOMY;  Surgeon: [name], MD;  Location: [facility];  Service: General;  Laterality: N/A;   ORIF  WRIST FRACTURE Right 02/09/2021   Procedure: OPEN REDUCTION INTERNAL FIXATION (ORIF) WRIST FRACTURE;  Surgeon: [name], MD;  Location: [facility] [place];  Service: Orthopedics;  Laterality: Right;  WITH REGIONAL   ROTATOR CUFF REPAIR Bilateral    SHOULDER ARTHROSCOPY W/ SUBACROMIAL DECOMPRESSION AND DISTAL CLAVICLE EXCISION Left 05/07/2008   @[facility]  by dr [name];   bone spurs removed   TOTAL KNEE ARTHROPLASTY Left 02/22/2023   Procedure: LEFT TOTAL KNEE ARTHROPLASTY;  Surgeon: [name], MD;  Location: [facility];  Service: Orthopedics;  Laterality: Left;   Patient Active Problem List   Diagnosis Date Noted   Arthrofibrosis of left total knee arthroplasty 04/10/2023   Status post total left knee replacement 02/22/2023   Hyperpigmented skin lesion 10/09/2022   Acute pain of left knee 08/01/2022   Dyslipidemia 03/08/2022   Crohn's disease of small intestine (HCC) 03/01/2022   Residual hemorrhoidal skin tags 03/31/2020   Prediabetes 01/20/2020   Erectile dysfunction 01/20/2020   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   Asthma 04/24/2015   Hx of colonic polyp 11/08/2014   History of colonic polyps 09/17/2014    PCP: Georgina Quint, MD  REFERRING PROVIDER: [name]*  REFERRING DIAG: W09.811 (ICD-10-CM) - Status post  total left knee replacement  Rationale for Evaluation and Treatment: Rehabilitation  THERAPY DIAG:  Stiffness of left knee, not elsewhere classified  Acute pain of left knee  Muscle weakness (generalized)  Other abnormalities of gait and mobility  Localized edema  Unsteadiness on feet  ONSET DATE: DOS: 02/22/23   SUBJECTIVE:                                                                                                                                                                                           SUBJECTIVE STATEMENT: Doing pretty well; knee still feels stiff   PERTINENT HISTORY:  Crohn's disease,  asthma, OA, hx Rt wrist fx 2022, bil RTC repair  PAIN:   NPRS scale: 0/10  Pain location: inner thigh L LE  Pain description: stretching "like a muscle or something"  Aggravating factors: nothing  Relieving factors: nothing   PRECAUTIONS:  None  RED FLAGS: None   WEIGHT BEARING RESTRICTIONS:  No  FALLS:  Has patient fallen in last 6 months? 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  PLOF:  Independent and Leisure: bowling  PATIENT GOALS:  Return to bowling, be back to normal   OBJECTIVE:   PATIENT SURVEYS:  03/13/23 FOTO 33 (predicted 62) 04/23/23: FOTO update: 63 %   COGNITIVE STATUS: Within functional limits for tasks assessed   SENSATION: WFL  GAIT: 03/13/23 Distance walked: 100' within clinc Assistive device utilized: Single point cane Level of assistance: Modified independence Comments: decr stance on Lt; decr hip/knee flexion on Lt   EDEMA: 03/13/23 Knee Joint Line: Rt: 39.2 cm  / Lt: 46 cm   LOWER EXTREMITY ROM:     ROM Right 03/13/23 Left 03/13/23 Left 03/19/23 Left  03/25/23 Left 03/27/23 Left 04/03/23 Left  04/11/23 Post MUA Left 04/12/2023 Left 04/17/23 Left 04/18/23 Left 04/23/23  Left 04/24/23  Knee flexion  A: 45 P: 50 (54 sitting with percussive device) A: 65 P: 73 AAROM seated edge of mat table 67*  A: 79  (supine) A: 82 P: 84 Seated P: 91* A: 82* After manual therapy P: 100* AROM supine heel slide 103 AROM seated 102*, AAROM 102* seated; 104* with knee flexion stretch on box  AA: 112 (supine)  A: 108 (supine) AAROM: 102  A: 100  AA: 115  A: 113   Knee extension A: 0 A: -19 (seated LAQ) P: -4 A: -10  P: -4 A: 10* seated heel propped on stool   A:  -9 (seated LAQ) P: -2 Seated quad set -7* Supine  P: -4*  AAROM -5*, AROM -20*  A: -8 P: -4 A: -7 P: 0   (Blank rows = not tested)   LOWER EXTREMITY MMT:    03/13/23: not formally tested; grossly 3-/5  MMT Right 04/01/2023 Left 04/01/2023  Knee flexion    Knee extension 5/5 95.6 lbs 4+/5 35.2, 39 lbs   (Blank rows = not tested)                  Today's Treatment                                                       DATE:  05/01/23 TherEx Recumbent bike L10 x 8 min; full revolutions seat 10 - 7 Shuttle LE press BLE 162# 3x10, single leg 87# 3x10 LLE TRX squats 3x10 RDL 20# KB 3x10 Cal raises 3x10 off incline board  Modalities Vaso x 10 min; Lt knee; mod pressure 34 deg with elevation  04/29/23 TherEx Recumbent bike L10 x 8 min; full revolutions seat 10 - 7 Shuttle LE press BLE 162# 3x10, single leg 87# 3x10 LLE BATCA knee extension 3x10; LLE only 10# BATCA hamstring curl 3x10; LLE only 20# TRX squats 3x10 Incline board calf stretch 3x30 sec Calf raises 3x10 RDL 15# KB 3x10 SLS 20-45 sec  04/24/23 TherEx Recumbent bike L14-9 x 8 min; full revolutions seat 11 - 8 Shuttle LE press BLE 150# 3x10, single leg 81# 3x10 LLE Seated LAQ 5# on Lt 3x10; 5 sec hold ROM measurements - see above AA heel slides 2x10 BATCA knee extension 2x15; LLE only 10# BATCA hamstring curl 2x15; LLE only 20# Calf raises x20 reps; no UE support LLE SLS 8, 20, 40 sec without UE support   04/23/23 TherEx Recumbent bike x 8 min; full revolutions seat 11 - 9 Shuttle LE press BLE 150# 3x10, single leg 81# 3x10 LLE LLE LAQ 5# 3x10; 3 sec hold Sit to/from stand without UE support 2x10 Standing TKE green TB 2 x 10 holding 3 sec Seated SLR: 4# 2 x 10 (pt needed cues for locking out  AAROM heel slides with strap and ball under foot x 20 holding stretch at end range  Modalities Vaso x 10 min; Lt knee; mod pressure 34 deg with elevation     PATIENT EDUCATION:  Education details:  HEP Person educated: Patient Education method: Explanation, Demonstration, and Handouts Education comprehension: verbalized understanding, returned demonstration, and needs further education  HOME EXERCISE PROGRAM: Access Code: [email or web address] URL: [email or web address] Date: 05/01/2023 Prepared by: Moshe Cipro  Exercises - Supine Heel Slide with Strap  - 5-10 x daily - 7 x weekly - 1 sets - 5-10 reps - 5 sec hold - Prone Quadriceps Stretch with Strap  - 1 x daily - 7 x weekly - 2 sets - 30 sec hold - Seated Small Alternating Straight Leg Lifts with Heel Touch  - 1 x daily - 7 x weekly - 2 sets - 10 reps - Kettlebell Deadlift  - 1 x daily - 7 x weekly - 3 sets - 10 reps - Squat with TRX  - 1 x daily - 7 x weekly - 3 sets - 10 reps - Single  Leg Knee Extension with Weight Machine  - 1 x daily - 7 x weekly - 3 sets - 10 reps - Single Leg Hamstring Curl with Weight Machine  - 1 x daily - 7 x weekly - 3 sets - 10 reps - Single Leg Press  - 1 x daily - 7 x weekly - 3 sets - 10 reps - Heel Raise on Step  - 1 x daily - 7 x weekly - 3 sets - 10 reps   ASSESSMENT:  CLINICAL IMPRESSION: Pt tolerated session well today and added to HEP to include gym program.  Anticipate he will be ready for d/c next visit.  GOALS: Goals reviewed with patient? Yes  SHORT TERM GOALS: Target date: 04/24/2023   Independent with updated HEP Goal status:  MET 05/01/23  2.  Lt knee PROM 2-0-100 deg for improved mobility and function Goal status: updated 04/11/2023   LONG TERM GOALS: Target date: 05/30/2022  Independent with final HEP Goal status: on going 04/01/2023  2.  FOTO score improved to 62 Goal status: MET 63% 04/23/23  3.  Lt knee AROM improved to 5-0-95 for improved function and mobility Goal status: on going 04/01/2023  4.  Report pain < 2/10 with standing and walking activities for improved function Goal status: MET 05/01/23  5.  Amb with LRAD modified independent without  significant gait deviations for improved function Goal status: on going 04/01/2023   PLAN:  PT FREQUENCY:  5x/wk for 2 weeks following MUA, then 2x/wk for 5 weeks  PT DURATION:  7 weeks additional following MUA  PLANNED INTERVENTIONS: 97164- PT Re-evaluation, 97110-Therapeutic exercises, 97530- Therapeutic activity, 97112- Neuromuscular re-education, 97535- Self Care, [id number]- Manual therapy, [id number]- Gait training, [phone]- Aquatic Therapy, 97014- Electrical stimulation (unattended), 97016- Vasopneumatic device, Patient/Family education, Balance training, Stair training, Taping, Dry Needling, DME instructions, Cryotherapy, and Moist heat.  PLAN FOR NEXT SESSION: check goals, d/c PT   NEXT MD VISIT: 04/24/23  Clarita Crane, PT, DPT 05/01/23 10:06 AM

## 2023-05-01 NOTE — Progress Notes (Signed)
Diagnosis: Crohn's Disease  Provider:  Chilton Greathouse MD  Procedure: IV Infusion  IV Type: Peripheral, IV Location: L Forearm  Skyrizi ([id number]), Dose: 600 mg  Infusion Start Time: 1308  Infusion Stop Time: 1415  Post Infusion IV Care: Peripheral IV Discontinued  Discharge: Condition: Good, Destination: Home . AVS Declined  Performed by:  Garnette Czech, RN

## 2023-05-06 ENCOUNTER — Ambulatory Visit (INDEPENDENT_AMBULATORY_CARE_PROVIDER_SITE_OTHER): Payer: BC Managed Care – PPO | Admitting: Physical Therapy

## 2023-05-06 ENCOUNTER — Encounter: Payer: Self-pay | Admitting: Physical Therapy

## 2023-05-06 DIAGNOSIS — R2681 Unsteadiness on feet: Secondary | ICD-10-CM

## 2023-05-06 DIAGNOSIS — M25562 Pain in left knee: Secondary | ICD-10-CM | POA: Diagnosis not present

## 2023-05-06 DIAGNOSIS — M25662 Stiffness of left knee, not elsewhere classified: Secondary | ICD-10-CM | POA: Diagnosis not present

## 2023-05-06 DIAGNOSIS — R2689 Other abnormalities of gait and mobility: Secondary | ICD-10-CM

## 2023-05-06 DIAGNOSIS — M6281 Muscle weakness (generalized): Secondary | ICD-10-CM

## 2023-05-06 DIAGNOSIS — R600 Localized edema: Secondary | ICD-10-CM

## 2023-05-06 NOTE — Therapy (Signed)
OUTPATIENT PHYSICAL THERAPY TREATMENT DISCHARGE SUMMARY   Patient Name: Randy Wilson MRN: [id number] DOB:11-Sep-1965, 57 y.o., male Today's Date: 05/06/2023      END OF SESSION:  PT End of Session - 05/06/23 0937     Visit Number 19    Authorization Type [employer] $52 copay    Progress Note Due on Visit 25   Progress note sent on 04/23/23   PT Start Time 0932    PT Stop Time 0950    PT Time Calculation (min) 18 min    Activity Tolerance Patient tolerated treatment well    Behavior During Therapy [facility] [place] for tasks assessed/performed                 Past Medical History:  Diagnosis Date   Crohn's disease of small intestine (HCC) 2017   followed by dr h. danis;  s/p colon resection 11-02-2015,  on humira   History of asthma    child   History of colon polyps    History of COVID-19 05/30/2020   positive result in epicrunny nose/cough x 3 weeks all symptoms resolved   History of gout    last flare up 1 year ago per pt on 04-03-2021   OA (osteoarthritis)    Right wrist fracture    fell off ladder per pt on 02-08-2021   Right wrist fracture, closed, initial encounter 01/22/2021   Past Surgical History:  Procedure Laterality Date   ANKLE SURGERY Left 2002   bone spurs removed   COLONOSCOPY     last one 02-25-2020  by dr h. danis   HARDWARE REMOVAL Right 04/06/2021   Procedure: Right wrist removal of hardware;  Surgeon: [name], MD;  Location: [facility];  Service: Orthopedics;  Laterality: Right;  with local anesthesia Needs 30 minutes   KNEE CLOSED REDUCTION Left 04/11/2023   Procedure: CLOSED MANIPULATION LEFT KNEE;  Surgeon: [name], MD;  Location: [facility];  Service: Orthopedics;  Laterality: Left;   LAPAROSCOPIC ILEOCECECTOMY N/A 11/02/2015   Procedure: LAPAROSCOPIC ILEOCECECTOMY;  Surgeon: [name], MD;  Location: [facility];  Service: General;  Laterality: N/A;   ORIF WRIST FRACTURE Right 02/09/2021    Procedure: OPEN REDUCTION INTERNAL FIXATION (ORIF) WRIST FRACTURE;  Surgeon: [name], MD;  Location: [facility];  Service: Orthopedics;  Laterality: Right;  WITH REGIONAL   ROTATOR CUFF REPAIR Bilateral    SHOULDER ARTHROSCOPY W/ SUBACROMIAL DECOMPRESSION AND DISTAL CLAVICLE EXCISION Left 05/07/2008   @[facility]  by dr [name];   bone spurs removed   TOTAL KNEE ARTHROPLASTY Left 02/22/2023   Procedure: LEFT TOTAL KNEE ARTHROPLASTY;  Surgeon: [name], MD;  Location: [facility];  Service: Orthopedics;  Laterality: Left;   Patient Active Problem List   Diagnosis Date Noted   Arthrofibrosis of left total knee arthroplasty 04/10/2023   Status post total left knee replacement 02/22/2023   Hyperpigmented skin lesion 10/09/2022   Acute pain of left knee 08/01/2022   Dyslipidemia 03/08/2022   Crohn's disease of small intestine (HCC) 03/01/2022   Residual hemorrhoidal skin tags 03/31/2020   Prediabetes 01/20/2020   Erectile dysfunction 01/20/2020   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   Asthma 04/24/2015   Hx of colonic polyp 11/08/2014   History of colonic polyps 09/17/2014    PCP: Georgina Quint, MD  REFERRING PROVIDER: [name]  REFERRING DIAG: Q65.784 (ICD-10-CM) - Status post total left knee replacement  Rationale for Evaluation and Treatment: Rehabilitation  THERAPY DIAG:  Stiffness of left knee, not elsewhere classified  Acute pain of left knee  Muscle weakness (generalized)  Other abnormalities of gait and mobility  Localized edema  Unsteadiness on feet  ONSET DATE: DOS: 02/22/23   SUBJECTIVE:                                                                                                                                                                                           SUBJECTIVE STATEMENT: Knee feels a little stiff but overall doing well with    PERTINENT HISTORY:  Crohn's disease, asthma, OA, hx Rt  wrist fx 2022, bil RTC repair  PAIN:   NPRS scale: 0/10  Pain location: inner thigh L LE  Pain description: stretching "like a muscle or something"  Aggravating factors: nothing  Relieving factors: nothing   PRECAUTIONS:  None  RED FLAGS: None   WEIGHT BEARING RESTRICTIONS:  No  FALLS:  Has patient fallen in last 6 months? 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  PLOF:  Independent and Leisure: bowling  PATIENT GOALS:  Return to bowling, be back to normal   OBJECTIVE:   PATIENT SURVEYS:  03/13/23 FOTO 33 (predicted 62) 04/23/23: FOTO update: 63 %   COGNITIVE STATUS: Within functional limits for tasks assessed   SENSATION: WFL  GAIT: 03/13/23 Distance walked: 100' within clinc Assistive device utilized: Single point cane Level of assistance: Modified independence Comments: decr stance on Lt; decr hip/knee flexion on Lt   EDEMA: 03/13/23 Knee Joint Line: Rt: 39.2 cm  / Lt: 46 cm   LOWER EXTREMITY ROM:     ROM Right 03/13/23 Left 03/13/23 Left 03/19/23 Left  03/25/23 Left 03/27/23 Left 04/03/23 Left  04/11/23 Post MUA Left 04/12/2023 Left 04/17/23 Left 04/18/23 Left 04/23/23  Left 04/24/23 Left 05/06/23  Knee flexion  A: 45 P: 50 (54 sitting with percussive device) A: 65 P: 73 AAROM seated edge of mat table 67*  A: 79  (supine) A: 82 P: 84 Seated P: 91* A: 82* After manual therapy P: 100* AROM supine heel slide 103 AROM seated 102*, AAROM 102* seated; 104* with knee flexion stretch on box  AA: 112 (supine)  A: 108 (supine) AAROM: 102  A: 100  AA: 115  A: 113  A: 116  Knee extension A: 0 A: -19 (seated LAQ) P: -4 A: -10 P: -4 A: 10*  seated heel propped on stool    A: -9 (seated LAQ) P: -2 Seated quad set -7* Supine  P: -4*  AAROM -5*, AROM -20*  A: -8 P: -4 A: -7 P: 0 A: -3 (seated LAQ) P: 0   (Blank rows = not tested)   LOWER EXTREMITY MMT:    03/13/23: not formally tested; grossly 3-/5  MMT Right 04/01/2023 Left 04/01/2023  Knee flexion    Knee extension 5/5 95.6 lbs 4+/5 35.2, 39 lbs   (Blank rows = not tested)                  Today's Treatment                                                       DATE:  05/06/23 TherEx Recumbent bike L10 x 8 min; full revolutions seat 10 - 7 ROM measurements - see above for details AA supine heel slides x 20 reps Discussed current HEP and pt reports going to the gym since last visit.  No concerns or issues  05/01/23 TherEx Recumbent bike L10 x 8 min; full revolutions seat 10 - 7 Shuttle LE press BLE 162# 3x10, single leg 87# 3x10 LLE TRX squats 3x10 RDL 20# KB 3x10 Cal raises 3x10 off incline board  Modalities Vaso x 10 min; Lt knee; mod pressure 34 deg with elevation  04/29/23 TherEx Recumbent bike L10 x 8 min; full revolutions seat 10 - 7 Shuttle LE press BLE 162# 3x10, single leg 87# 3x10 LLE BATCA knee extension 3x10; LLE only 10# BATCA hamstring curl 3x10; LLE only 20# TRX squats 3x10 Incline board calf stretch 3x30 sec Calf raises 3x10 RDL 15# KB 3x10 SLS 20-45 sec  04/24/23 TherEx Recumbent bike L14-9 x 8 min; full revolutions seat 11 - 8 Shuttle LE press BLE 150# 3x10, single leg 81# 3x10 LLE Seated LAQ 5# on Lt 3x10; 5 sec hold ROM measurements - see above AA heel slides 2x10 BATCA knee extension 2x15; LLE only 10# BATCA hamstring curl 2x15; LLE only 20# Calf raises x20 reps; no UE support LLE SLS 8, 20, 40 sec without UE support   04/23/23 TherEx Recumbent bike x 8 min; full revolutions seat 11 - 9 Shuttle LE press BLE 150# 3x10, single leg 81# 3x10 LLE LLE LAQ 5# 3x10; 3 sec hold Sit to/from stand without UE support 2x10 Standing TKE green TB 2 x 10 holding  3 sec Seated SLR: 4# 2 x 10 (pt needed cues for locking out  AAROM heel slides with strap and ball under foot x 20 holding stretch at end range  Modalities Vaso x 10 min; Lt knee; mod pressure 34 deg with elevation     PATIENT EDUCATION:  Education details: HEP Person educated: Patient Education method: Explanation, Demonstration, and Handouts Education comprehension: verbalized understanding, returned demonstration, and needs further education  HOME EXERCISE PROGRAM: Access Code: [email or web address] URL: [email or web address] Date: 05/01/2023 Prepared by: Moshe Cipro  Exercises - Supine Heel Slide with Strap  - 5-10 x daily - 7 x weekly - 1 sets - 5-10 reps - 5 sec hold - Prone Quadriceps Stretch with Strap  - 1 x daily - 7 x weekly - 2 sets - 30 sec hold - Seated Small Alternating Straight Leg Lifts with Heel Touch  - 1 x daily -  7 x weekly - 2 sets - 10 reps - Kettlebell Deadlift  - 1 x daily - 7 x weekly - 3 sets - 10 reps - Squat with TRX  - 1 x daily - 7 x weekly - 3 sets - 10 reps - Single Leg Knee Extension with Weight Machine  - 1 x daily - 7 x weekly - 3 sets - 10 reps - Single Leg Hamstring Curl with Weight Machine  - 1 x daily - 7 x weekly - 3 sets - 10 reps - Single Leg Press  - 1 x daily - 7 x weekly - 3 sets - 10 reps - Heel Raise on Step  - 1 x daily - 7 x weekly - 3 sets - 10 reps   ASSESSMENT:  CLINICAL IMPRESSION: Pt has met all goals at this time and is ready for d/c from PT.  Will d/c PT today.  GOALS: Goals reviewed with patient? Yes  SHORT TERM GOALS: Target date: 04/24/2023   Independent with updated HEP Goal status:  MET 05/01/23  2.  Lt knee PROM 2-0-100 deg for improved mobility and function Goal status: updated 04/11/2023   LONG TERM GOALS: Target date: 05/30/2022  Independent with final HEP Goal status: MET 05/06/23  2.  FOTO score improved to 62 Goal status: MET 63% 04/23/23  3.  Lt knee AROM improved to 5-0-95 for  improved function and mobility Goal status: MET 05/06/23  4.  Report pain < 2/10 with standing and walking activities for improved function Goal status: MET 05/01/23  5.  Amb with LRAD modified independent without significant gait deviations for improved function Goal status: MET 05/06/23   PLAN:  PT FREQUENCY:  5x/wk for 2 weeks following MUA, then 2x/wk for 5 weeks  PT DURATION:  7 weeks additional following MUA  PLANNED INTERVENTIONS: 97164- PT Re-evaluation, 97110-Therapeutic exercises, 97530- Therapeutic activity, 97112- Neuromuscular re-education, 97535- Self Care, 04540- Manual therapy, [id number]- Gait training, [phone]- Aquatic Therapy, 97014- Electrical stimulation (unattended), 97016- Vasopneumatic device, Patient/Family education, Balance training, Stair training, Taping, Dry Needling, DME instructions, Cryotherapy, and Moist heat.  PLAN FOR NEXT SESSION: d/c PT today   NEXT MD VISIT: 04/24/23  Clarita Crane, PT, DPT 05/06/23 9:54 AM   PHYSICAL THERAPY DISCHARGE SUMMARY  Visits from Start of Care: 19  Current functional level related to goals / functional outcomes: See above   Remaining deficits: See above   Education / Equipment: HEP   Patient agrees to discharge. Patient goals were met. Patient is being discharged due to meeting the stated rehab goals.  Clarita Crane, PT, DPT 05/06/23 9:54 AM  [facility] [address] [place], [place], [address] Phone: [phone]   Fax:  [phone]

## 2023-05-08 ENCOUNTER — Encounter: Payer: BC Managed Care – PPO | Admitting: Physical Therapy

## 2023-05-13 ENCOUNTER — Encounter: Payer: BC Managed Care – PPO | Admitting: Physical Therapy

## 2023-05-15 ENCOUNTER — Encounter: Payer: BC Managed Care – PPO | Admitting: Physical Therapy

## 2023-05-23 ENCOUNTER — Encounter: Payer: BC Managed Care – PPO | Admitting: Rehabilitative and Restorative Service Providers"

## 2023-05-27 ENCOUNTER — Encounter: Payer: BC Managed Care – PPO | Admitting: Physical Therapy

## 2023-05-27 ENCOUNTER — Encounter: Payer: Self-pay | Admitting: Gastroenterology

## 2023-05-30 ENCOUNTER — Ambulatory Visit: Payer: 59 | Admitting: Gastroenterology

## 2023-05-30 ENCOUNTER — Encounter: Payer: BC Managed Care – PPO | Admitting: Physical Therapy

## 2023-05-30 ENCOUNTER — Encounter: Payer: Self-pay | Admitting: Gastroenterology

## 2023-05-30 ENCOUNTER — Ambulatory Visit: Payer: BC Managed Care – PPO

## 2023-05-30 VITALS — BP 140/86 | HR 88 | Wt 227.0 lb

## 2023-05-30 DIAGNOSIS — Z7960 Long term (current) use of unspecified immunomodulators and immunosuppressants: Secondary | ICD-10-CM

## 2023-05-30 DIAGNOSIS — K5000 Crohn's disease of small intestine without complications: Secondary | ICD-10-CM | POA: Diagnosis not present

## 2023-05-30 NOTE — Progress Notes (Signed)
 [facility] Progress Note  Chief Complaint: Ileal Crohn's disease, status post prior surgery  Subjective  Prior history  Summary of pertinent GI issues: Ileal Crohn's disease presenting with giant inflammatory polyp on ileocecal valve.  Ileocecal resection, periods of time afterwards with noncompliant follow-up, even after starting Humira .  Later improved adherence to follow-up on Humira  monotherapy,, poor control on colonoscopy September 2023.  Recommended Skyrizi , insurance would approve Stelara  which was started February 2024. At office visit June 2024, he was complaining of increased diarrhea toward the latter part of his dose timeframe, fecal calprotectin elevated at 205, trough Stelara  level undetected with elevated antibodies at 269. After multiple subsequent denials and a lengthy and frustrating appeal process, he was approved for Skyrizi  in late September 2024.    Initial Skyrizi  infusion 04/03/2023   _________________  Randy Wilson is glad to report significant improvement since starting Skyrizi .  He has no abdominal pain, and typically has 2 formed stools per day on average without blood.  Appetite is good and weight stable.  He denies any chronic upper digestive symptoms.  No reactions to infusions.  He is due for his third infusion next week and then will start subcutaneous injections which he feels that he will have no problem doing.  He is also recovering from a November left total knee replacement and subsequent joint manipulation ROS: Cardiovascular:  no chest pain Respiratory: no dyspnea  The patient's Past Medical, Family and Social History were reviewed and are on file in the EMR. Past Medical History:  Diagnosis Date   Crohn's disease of small intestine (HCC) 2017   followed by dr h. danis;  s/p colon resection 11-02-2015,  on humira    History of asthma    child   History of colon polyps    History of COVID-19 05/30/2020   positive result in epicrunny nose/cough x  3 weeks all symptoms resolved   History of gout    last flare up 1 year ago per pt on 04-03-2021   OA (osteoarthritis)    Right wrist fracture    fell off ladder per pt on 02-08-2021   Right wrist fracture, closed, initial encounter 01/22/2021    Past Surgical History:  Procedure Laterality Date   ANKLE SURGERY Left 2002   bone spurs removed   COLONOSCOPY     last one 02-25-2020  by dr h. danis   HARDWARE REMOVAL Right 04/06/2021   Procedure: Right wrist removal of hardware;  Surgeon: Alyse Agent, MD;  Location: [facility];  Service: Orthopedics;  Laterality: Right;  with local anesthesia Needs 30 minutes   KNEE CLOSED REDUCTION Left 04/11/2023   Procedure: CLOSED MANIPULATION LEFT KNEE;  Surgeon: Vernetta Lonni GRADE, MD;  Location: [facility];  Service: Orthopedics;  Laterality: Left;   LAPAROSCOPIC ILEOCECECTOMY N/A 11/02/2015   Procedure: LAPAROSCOPIC ILEOCECECTOMY;  Surgeon: Bernarda Ned, MD;  Location: [facility];  Service: General;  Laterality: N/A;   ORIF WRIST FRACTURE Right 02/09/2021   Procedure: OPEN REDUCTION INTERNAL FIXATION (ORIF) WRIST FRACTURE;  Surgeon: Alyse Agent, MD;  Location: [facility] [place];  Service: Orthopedics;  Laterality: Right;  WITH REGIONAL   ROTATOR CUFF REPAIR Bilateral    SHOULDER ARTHROSCOPY W/ SUBACROMIAL DECOMPRESSION AND DISTAL CLAVICLE EXCISION Left 05/07/2008   @[facility]  by dr yvone;   bone spurs removed   TOTAL KNEE ARTHROPLASTY Left 02/22/2023   Procedure: LEFT TOTAL KNEE ARTHROPLASTY;  Surgeon: Vernetta Lonni GRADE, MD;  Location: [facility];  Service:  Orthopedics;  Laterality: Left;     Objective:  Med list reviewed  Current Outpatient Medications:    cetirizine  (ZYRTEC ) 10 MG tablet, Take 1 tablet (10 mg total) by mouth daily. (Patient taking differently: Take 10 mg by mouth daily as needed for allergies.), Disp: 30 tablet, Rfl: 11   Risankizumab -rzaa (SKYRIZI ) 180 MG/1.2ML SOCT, Inject 1.2  mLs into the skin every 8 (eight) weeks. BEGIN AT WEEK 12, Disp: 1.2 mL, Rfl: 5   allopurinol  (ZYLOPRIM ) 100 MG tablet, TAKE 1 TABLET BY MOUTH EVERY DAY (Patient not taking: Reported on 04/08/2023), Disp: 90 tablet, Rfl: 3   EPINEPHrine  0.3 mg/0.3 mL IJ SOAJ injection, Inject 0.3 mg into the muscle as needed for anaphylaxis. (Patient not taking: Reported on 05/30/2023), Disp: 1 each, Rfl: 3   ketorolac  (TORADOL ) 10 MG tablet, Take 1 tablet (10 mg total) by mouth every 6 (six) hours. Take with snack for the next 3 days (Patient not taking: Reported on 05/30/2023), Disp: 12 tablet, Rfl: 0   oxyCODONE  (OXY IR/ROXICODONE ) 5 MG immediate release tablet, Take 1-2 tablets (5-10 mg total) by mouth 3 (three) times daily as needed for severe pain (pain score 7-10). (Patient not taking: Reported on 05/30/2023), Disp: 30 tablet, Rfl: 0   tetrahydrozoline-zinc (VISINE-AC) 0.05-0.25 % ophthalmic solution, Place 2 drops into both eyes 3 (three) times daily as needed (irritated eyes). (Patient not taking: Reported on 05/30/2023), Disp: , Rfl:    tiZANidine  (ZANAFLEX ) 4 MG tablet, Take 1 tablet (4 mg total) by mouth every 6 (six) hours as needed for muscle spasms. (Patient not taking: Reported on 05/30/2023), Disp: 180 tablet, Rfl: 1   Vital signs in last 24 hrs: Vitals:   05/30/23 0819  BP: (!) 140/86  Pulse: 88   Wt Readings from Last 3 Encounters:  05/30/23 227 lb (103 kg)  05/01/23 225 lb (102.1 kg)  04/11/23 235 lb (106.6 kg)    Physical Exam Well-appearing Cardiac: Regular without appreciable murmur,  no peripheral edema Pulm: clear to auscultation bilaterally, normal RR and effort noted Abdomen: soft, no tenderness, with active bowel sounds. No guarding or palpable hepatosplenomegaly.    Labs:   ___________________________________________ Radiologic studies:   ____________________________________________ Other:   _____________________________________________   Encounter Diagnoses  Name  Primary?   Crohn's disease of small intestine without complication (HCC) Yes   Long-term use of immunosuppressant medication     Assessment and Plan Currently doing very well near the end of his Skyrizi  induction phase with significant symptom improvement and no apparent side effects of treatment.    Plan: Continue current treatment plan, follow-up with me in 6 months or sooner if needed. Get a flu and COVID-vaccine if not done already.  22 minutes were spent on this encounter (including chart review, history/exam, counseling/coordination of care, and documentation) > 50% of that time was spent on counseling and coordination of care.   Randy Wilson

## 2023-05-30 NOTE — Patient Instructions (Signed)
 We will contact you to get your 6 month follow up!  _______________________________________________________  If your blood pressure at your visit was 140/90 or greater, please contact your primary care physician to follow up on this.  _______________________________________________________  If you are age 60 or older, your body mass index should be between 23-30. Your Body mass index is 29.95 kg/m. If this is out of the aforementioned range listed, please consider follow up with your Primary Care Provider.  If you are age 59 or younger, your body mass index should be between 19-25. Your Body mass index is 29.95 kg/m. If this is out of the aformentioned range listed, please consider follow up with your Primary Care Provider.   ________________________________________________________  The [facility] providers would like to encourage you to use MYCHART to communicate with providers for non-urgent requests or questions.  Due to long hold times on the telephone, sending your provider a message by [facility] may be a faster and more efficient way to get a response.  Please allow 48 business hours for a response.  Please remember that this is for non-urgent requests.  _______________________________________________________ It was a pleasure to see you today!  Thank you for trusting me with your gastrointestinal care!

## 2023-05-31 ENCOUNTER — Ambulatory Visit: Payer: BC Managed Care – PPO

## 2023-06-03 ENCOUNTER — Other Ambulatory Visit: Payer: Self-pay

## 2023-06-03 ENCOUNTER — Encounter: Payer: BC Managed Care – PPO | Admitting: Physical Therapy

## 2023-06-03 ENCOUNTER — Telehealth: Payer: Self-pay

## 2023-06-03 NOTE — Telephone Encounter (Signed)
 Auth Submission: APPROVED Site of care: Site of care: [facility] Payer: [employer] Medication & CPT/J Code(s) submitted: Skyrizi  (Risankizumab -rzaa) G7672 Route of submission (phone, fax, portal): portal Phone # Fax # Auth type: Buy/Bill PB Units/visits requested: 600mg  x 1 dose Reference number: [id number] Approval from: 05/30/23 to 08/27/23   Only 1 more induction dose is needed.

## 2023-06-05 ENCOUNTER — Encounter: Payer: BC Managed Care – PPO | Admitting: Physical Therapy

## 2023-06-14 ENCOUNTER — Encounter: Payer: Self-pay | Admitting: Gastroenterology

## 2023-06-14 ENCOUNTER — Ambulatory Visit: Payer: 59

## 2023-06-14 VITALS — BP 158/93 | HR 56 | Temp 97.4°F | Resp 18 | Ht 73.0 in | Wt 246.4 lb

## 2023-06-14 DIAGNOSIS — K5000 Crohn's disease of small intestine without complications: Secondary | ICD-10-CM

## 2023-06-14 MED ORDER — SODIUM CHLORIDE 0.9 % IV SOLN
600.0000 mg | Freq: Once | INTRAVENOUS | Status: AC
  Administered 2023-06-14: 600 mg via INTRAVENOUS
  Filled 2023-06-14: qty 10

## 2023-06-14 NOTE — Progress Notes (Signed)
Diagnosis: Crohn's disease  Provider:  Chilton Greathouse MD  Procedure: IV Infusion  IV Type: Peripheral, IV Location: R Forearm  Skyrizi (risankizumab-rzaa), Dose: 600 mg  Infusion Start Time: 1045  Infusion Stop Time: 1203  Post Infusion IV Care: Peripheral IV Discontinued  Discharge: Condition: Good, Destination: Home . AVS Declined  Performed by:  Garnette Czech, RN

## 2023-06-20 ENCOUNTER — Encounter: Payer: Self-pay | Admitting: Physician Assistant

## 2023-06-20 ENCOUNTER — Other Ambulatory Visit (INDEPENDENT_AMBULATORY_CARE_PROVIDER_SITE_OTHER): Payer: 59

## 2023-06-20 ENCOUNTER — Ambulatory Visit: Payer: 59 | Admitting: Physician Assistant

## 2023-06-20 DIAGNOSIS — M25561 Pain in right knee: Secondary | ICD-10-CM

## 2023-06-20 DIAGNOSIS — Z96652 Presence of left artificial knee joint: Secondary | ICD-10-CM

## 2023-06-20 MED ORDER — METHYLPREDNISOLONE ACETATE 40 MG/ML IJ SUSP
40.0000 mg | INTRAMUSCULAR | Status: AC | PRN
  Administered 2023-06-20: 40 mg via INTRA_ARTICULAR

## 2023-06-20 MED ORDER — LIDOCAINE HCL 1 % IJ SOLN
5.0000 mL | INTRAMUSCULAR | Status: AC | PRN
  Administered 2023-06-20: 5 mL

## 2023-06-20 NOTE — Progress Notes (Signed)
HPI: Randy Wilson returns today follow-up left knee.  He underwent a left total knee arthroplasty 02/22/2023 and a left knee manipulation under anesthesia on 04/11/2023.  He still has some soreness the lateral aspect of the knee.  He has not had any injury to the left knee.  However his main complaint today is right knee pain.  Takes ibuprofen which is not helping with the knee pain.  He feels that the knee is swollen.  He reports that he is prediabetic.  Review of systems: Negative for fevers chills.   Physical exam: General Well-developed well-nourished male no acute distress mood affect appropriate. Psych: Alert and oriented x 3. Left knee full extension flexion to approximately 115 degrees.  No instability valgus varus stressing.  No abnormal warmth erythema or effusion.  Surgical incisions healed well.  Right knee positive effusion no abnormal warmth erythema.  Otherwise he has full extension and flexion beyond 90 degrees.  Radiographs: AP view bilateral knees and lateral right knee: Left total knee arthroplasty components appear well-seated.  Left knee is well located.  Right knee well located.  Moderate narrowing medial joint line.  Mild changes lateral compartment with osteophyte off the femoral condyle.  Mild to moderate patellofemoral changes.  No acute fractures or bony abnormalities of either knee.  Impression: Status post left total knee arthroplasty Right knee osteoarthritis   Plan: Recommend he continue to work on range of motion strengthening left knee.  In regards to the right knee given the effusion recommend aspiration and injection with cortisone.  Have him work on quad strengthening exercises.  He can follow-up with [place] as needed for the right knee.  He understands to wait least 3 months between cortisone injections.  In regards to his left knee we will see him back at 1 year postop sooner if there is any questions or concerns.  Questions were encouraged and answered at  length.      Procedure Note  Patient: Randy Wilson             Date of Birth: 1965/06/29           MRN: [id number]             Visit Date: 06/20/2023  Procedures: Visit Diagnoses:  1. Right knee pain, unspecified chronicity   2. Status post total left knee replacement     Large Joint Inj: R knee on 06/20/2023 5:26 PM Indications: pain Details: 22 G 1.5 in needle, superolateral approach  Arthrogram: No  Medications: 5 mL lidocaine 1 %; 40 mg methylPREDNISolone acetate 40 MG/ML Aspirate: 45 mL yellow Outcome: tolerated well, no immediate complications Procedure, treatment alternatives, risks and benefits explained, specific risks discussed. Consent was given by the patient. Immediately prior to procedure a time out was called to verify the correct patient, procedure, equipment, support staff and site/side marked as required. Patient was prepped and draped in the usual sterile fashion.

## 2023-07-03 ENCOUNTER — Encounter: Payer: 59 | Admitting: Emergency Medicine

## 2023-07-09 ENCOUNTER — Encounter: Payer: Self-pay | Admitting: Emergency Medicine

## 2023-07-09 ENCOUNTER — Ambulatory Visit (INDEPENDENT_AMBULATORY_CARE_PROVIDER_SITE_OTHER): Payer: 59 | Admitting: Emergency Medicine

## 2023-07-09 VITALS — BP 142/90 | HR 72 | Temp 97.4°F | Ht 73.0 in | Wt 240.0 lb

## 2023-07-09 DIAGNOSIS — Z13228 Encounter for screening for other metabolic disorders: Secondary | ICD-10-CM

## 2023-07-09 DIAGNOSIS — R29818 Other symptoms and signs involving the nervous system: Secondary | ICD-10-CM | POA: Insufficient documentation

## 2023-07-09 DIAGNOSIS — Z130 Encounter for screening for diseases of the blood and blood-forming organs and certain disorders involving the immune mechanism: Secondary | ICD-10-CM

## 2023-07-09 DIAGNOSIS — Z125 Encounter for screening for malignant neoplasm of prostate: Secondary | ICD-10-CM

## 2023-07-09 DIAGNOSIS — Z1329 Encounter for screening for other suspected endocrine disorder: Secondary | ICD-10-CM | POA: Diagnosis not present

## 2023-07-09 DIAGNOSIS — Z0001 Encounter for general adult medical examination with abnormal findings: Secondary | ICD-10-CM | POA: Diagnosis not present

## 2023-07-09 DIAGNOSIS — E785 Hyperlipidemia, unspecified: Secondary | ICD-10-CM | POA: Diagnosis not present

## 2023-07-09 DIAGNOSIS — R030 Elevated blood-pressure reading, without diagnosis of hypertension: Secondary | ICD-10-CM

## 2023-07-09 DIAGNOSIS — R7303 Prediabetes: Secondary | ICD-10-CM | POA: Diagnosis not present

## 2023-07-09 DIAGNOSIS — R399 Unspecified symptoms and signs involving the genitourinary system: Secondary | ICD-10-CM | POA: Diagnosis not present

## 2023-07-09 DIAGNOSIS — K5000 Crohn's disease of small intestine without complications: Secondary | ICD-10-CM | POA: Diagnosis not present

## 2023-07-09 DIAGNOSIS — M24662 Ankylosis, left knee: Secondary | ICD-10-CM

## 2023-07-09 LAB — CBC WITH DIFFERENTIAL/PLATELET
Basophils Absolute: 0 10*3/uL (ref 0.0–0.1)
Basophils Relative: 0.4 % (ref 0.0–3.0)
Eosinophils Absolute: 0.3 10*3/uL (ref 0.0–0.7)
Eosinophils Relative: 5.3 % — ABNORMAL HIGH (ref 0.0–5.0)
HCT: 43.4 % (ref 39.0–52.0)
Hemoglobin: 14.8 g/dL (ref 13.0–17.0)
Lymphocytes Relative: 27.8 % (ref 12.0–46.0)
Lymphs Abs: 1.7 10*3/uL (ref 0.7–4.0)
MCHC: 34.2 g/dL (ref 30.0–36.0)
MCV: 84.4 fL (ref 78.0–100.0)
Monocytes Absolute: 0.5 10*3/uL (ref 0.1–1.0)
Monocytes Relative: 8.8 % (ref 3.0–12.0)
Neutro Abs: 3.5 10*3/uL (ref 1.4–7.7)
Neutrophils Relative %: 57.7 % (ref 43.0–77.0)
Platelets: 271 10*3/uL (ref 150.0–400.0)
RBC: 5.14 Mil/uL (ref 4.22–5.81)
RDW: 14.2 % (ref 11.5–15.5)
WBC: 6.1 10*3/uL (ref 4.0–10.5)

## 2023-07-09 LAB — LIPID PANEL
Cholesterol: 176 mg/dL (ref 0–200)
HDL: 53.1 mg/dL (ref >39.00)
LDL Cholesterol: 99 mg/dL (ref 0–99)
NonHDL: 122.41
Total CHOL/HDL Ratio: 3
Triglycerides: 115 mg/dL (ref 0.0–149.0)
VLDL: 23 mg/dL (ref 0.0–40.0)

## 2023-07-09 LAB — COMPREHENSIVE METABOLIC PANEL
ALT: 19 U/L (ref 0–53)
AST: 18 U/L (ref 0–37)
Albumin: 4.7 g/dL (ref 3.5–5.2)
Alkaline Phosphatase: 70 U/L (ref 39–117)
BUN: 12 mg/dL (ref 6–23)
CO2: 30 meq/L (ref 19–32)
Calcium: 9.7 mg/dL (ref 8.4–10.5)
Chloride: 103 meq/L (ref 96–112)
Creatinine, Ser: 0.89 mg/dL (ref 0.40–1.50)
GFR: 93.47 mL/min (ref >60.00)
Glucose, Bld: 102 mg/dL — ABNORMAL HIGH (ref 70–99)
Potassium: 4.3 meq/L (ref 3.5–5.1)
Sodium: 140 meq/L (ref 135–145)
Total Bilirubin: 0.7 mg/dL (ref 0.2–1.2)
Total Protein: 7.8 g/dL (ref 6.0–8.3)

## 2023-07-09 LAB — HEMOGLOBIN A1C: Hgb A1c MFr Bld: 6.1 % (ref 4.6–6.5)

## 2023-07-09 LAB — PSA: PSA: 1.41 ng/mL (ref 0.10–4.00)

## 2023-07-09 MED ORDER — TAMSULOSIN HCL 0.4 MG PO CAPS: 0.4000 mg | ORAL_CAPSULE | Freq: Every day | ORAL | 3 refills | Status: DC

## 2023-07-09 NOTE — Assessment & Plan Note (Signed)
Stable. Diet and nutrition discussed.  The 10-year ASCVD risk score (Arnett DK, et al., 2019) is: 9.5%   Values used to calculate the score:     Age: 58 years     Sex: Male     Is Non-Hispanic African American: Yes     Diabetic: No     Tobacco smoker: No     Systolic Blood Pressure: 142 mmHg     Is BP treated: No     HDL Cholesterol: 52.9 mg/dL     Total Cholesterol: 191 mg/dL

## 2023-07-09 NOTE — Assessment & Plan Note (Signed)
Stable.  Sees gastrointestinal doctor on a regular basis. Started on [employer] about 3 months ago

## 2023-07-09 NOTE — Assessment & Plan Note (Signed)
Elevated blood pressure reading in the office Advised to monitor blood pressure readings at home daily for the next several weeks and keep a log.  Advised to contact the office if numbers persistently abnormal.  May need to be started on antihypertensive medication.  Cardiovascular risks associated with hypertension discussed.  Dietary approaches to stop hypertension discussed.  Benefits of exercise discussed.

## 2023-07-09 NOTE — Assessment & Plan Note (Signed)
Still in chronic pain to left knee Need to follow-up with orthopedist

## 2023-07-09 NOTE — Progress Notes (Signed)
Randy Wilson 58 y.o.   Chief Complaint  Patient presents with   Annual Exam    Patient here for physical. Also c/o of still having knee pain. Pt is taking Skyrizi 80mg  I could not look this up    HISTORY OF PRESENT ILLNESS: This is a 58 y.o. male here for annual exam. History of Crohns disease.Started on [employer] 3 mos ago. Has the following complaints/concerns: 1.  Ongoing pain to left knee.  Status post total knee replacement last September with manipulation procedure shortly after that 2.  Slightly elevated blood pressure readings at home 3.  Lower urinary tract symptoms with nocturia and loss of flow 4.  Lack of sleeping quality.  Benadryl helps. No other complaints or medical concerns today.  HPI   Prior to Admission medications   Not on File    Allergies  Allergen Reactions   Tramadol Itching   Shrimp [Shellfish Allergy] Hives    Patient Active Problem List   Diagnosis Date Noted   Arthrofibrosis of left total knee arthroplasty 04/10/2023   Status post total left knee replacement 02/22/2023   Hyperpigmented skin lesion 10/09/2022   Dyslipidemia 03/08/2022   Crohn's disease of small intestine (HCC) 03/01/2022   Residual hemorrhoidal skin tags 03/31/2020   Prediabetes 01/20/2020   Erectile dysfunction 01/20/2020   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   Asthma 04/24/2015   Hx of colonic polyp 11/08/2014   History of colonic polyps 09/17/2014    Past Medical History:  Diagnosis Date   Crohn's disease of small intestine (HCC) 2017   followed by dr h. danis;  s/p colon resection 11-02-2015,  on humira   History of asthma    child   History of colon polyps    History of COVID-19 05/30/2020   positive result in epicrunny nose/cough x 3 weeks all symptoms resolved   History of gout    last flare up 1 year ago per pt on 04-03-2021   OA (osteoarthritis)    Right wrist fracture    fell off ladder per pt on 02-08-2021   Right wrist fracture, closed, initial  encounter 01/22/2021    Past Surgical History:  Procedure Laterality Date   ANKLE SURGERY Left 2002   bone spurs removed   COLONOSCOPY     last one 02-25-2020  by dr h. danis   HARDWARE REMOVAL Right 04/06/2021   Procedure: Right wrist removal of hardware;  Surgeon: [name], MD;  Location[facility];  Service: Orthopedics;  Laterality: Right;  with local anesthesia Needs 30 minutes   KNEE CLOSED REDUCTION Left 04/11/2023   Procedure: CLOSED MANIPULATION LEFT KNEE;  Surgeon: [name], MD;  Location: [facility];  Service: Orthopedics;  Laterality: Left;   LAPAROSCOPIC ILEOCECECTOMY N/A 11/02/2015   Procedure: LAPAROSCOPIC ILEOCECECTOMY;  Surgeon: [name], MD;  Location: [facility];  Service: General;  Laterality: N/A;   ORIF WRIST FRACTURE Right 02/09/2021   Procedure: OPEN REDUCTION INTERNAL FIXATION (ORIF) WRIST FRACTURE;  Surgeon: [name], MD;  Location: [facility];  Service: Orthopedics;  Laterality: Right;  WITH REGIONAL   ROTATOR CUFF REPAIR Bilateral    SHOULDER ARTHROSCOPY W/ SUBACROMIAL DECOMPRESSION AND DISTAL CLAVICLE EXCISION Left 05/07/2008   @[facility]  by dr [name];   bone spurs removed   TOTAL KNEE ARTHROPLASTY Left 02/22/2023   Procedure: LEFT TOTAL KNEE ARTHROPLASTY;  Surgeon: [name], MD;  Location: [facility];  Service: Orthopedics;  Laterality: Left;  Social History   Socioeconomic History   Marital status: Married    Spouse name: Debbe Odea    Number of children: 3   Years of education: Not on file   Highest education level: High school graduate  Occupational History   Occupation: Recieving Solicitor    Comment: [employer]  Tobacco Use   Smoking status: Former    Current packs/day: 0.00    Average packs/day: 1 pack/day for 25.0 years (25.0 ttl pk-yrs)    Types: Cigarettes    Start date: 01/21/1981    Quit date: 01/21/2006    Years since quitting: 17.4   Smokeless tobacco: Never   Vaping Use   Vaping status: Never Used  Substance and Sexual Activity   Alcohol use: Yes    Alcohol/week: 6.0 standard drinks of alcohol    Types: 6 Cans of beer per week    Comment: [facility] beer   Drug use: Never   Sexual activity: Yes    Comment: with monogamous wife, she takes OCP  Other Topics Concern   Not on file  Social History Narrative   Pt is from [place].    Social Drivers of Corporate investment banker Strain: Low Risk  (10/24/2017)   Overall Financial Resource Strain (CARDIA)    Difficulty of Paying Living Expenses: Not hard at all  Food Insecurity: No Food Insecurity (02/22/2023)   Hunger Vital Sign    Worried About Running Out of Food in the Last Year: Never true    Ran Out of Food in the Last Year: Never true  Transportation Needs: No Transportation Needs (02/22/2023)   PRAPARE - Administrator, Civil Service (Medical): No    Lack of Transportation (Non-Medical): No  Physical Activity: Sufficiently Active (10/24/2017)   Exercise Vital Sign    Days of Exercise per Week: 4 days    Minutes of Exercise per Session: 60 min  Stress: No Stress Concern Present (10/24/2017)   [employer] of Occupational Health - Occupational Stress Questionnaire    Feeling of Stress : Not at all  Social Connections: Moderately Integrated (10/24/2017)   Social Connection and Isolation Panel [NHANES]    Frequency of Communication with Friends and Family: More than three times a week    Frequency of Social Gatherings with Friends and Family: More than three times a week    Attends Religious Services: 1 to 4 times per year    Active Member of [employer] or Organizations: No    Attends Banker Meetings: Never    Marital Status: Married  Catering manager Violence: Not At Risk (02/22/2023)   Humiliation, Afraid, Rape, and Kick questionnaire    Fear of Current or Ex-Partner: No    Emotionally Abused: No    Physically Abused: No    Sexually Abused: No    Family  History  Problem Relation Age of Onset   Asthma Mother 33       asthma attack    Asthma Sister    Stomach cancer Sister    Stroke Brother        Myocardial infarction   Stroke Brother    Colon cancer Neg Hx    Rectal cancer Neg Hx    Esophageal cancer Neg Hx    Colon polyps Neg Hx    Allergic rhinitis Neg Hx    Angioedema Neg Hx    Eczema Neg Hx    Urticaria Neg Hx    Liver cancer Neg Hx    Pancreatic  cancer Neg Hx      Review of Systems  Constitutional:  Positive for malaise/fatigue. Negative for chills and fever.  HENT: Negative.  Negative for congestion and sore throat.   Respiratory: Negative.  Negative for cough and shortness of breath.   Cardiovascular: Negative.  Negative for chest pain and palpitations.  Gastrointestinal:  Positive for constipation. Negative for abdominal pain, diarrhea, nausea and vomiting.  Genitourinary:  Positive for frequency. Negative for dysuria.       Nocturia  Musculoskeletal:  Positive for joint pain.  Skin: Negative.  Negative for rash.  Neurological: Negative.  Negative for dizziness and headaches.  All other systems reviewed and are negative.   Vitals:   07/09/23 0926  BP: (!) 142/90  Pulse: 72  Temp: (!) 97.4 F (36.3 C)  SpO2: 95%    Physical Exam Vitals reviewed.  Constitutional:      Appearance: Normal appearance.  HENT:     Head: Normocephalic.     Right Ear: Tympanic membrane, ear canal and external ear normal.     Left Ear: Tympanic membrane, ear canal and external ear normal.     Mouth/Throat:     Mouth: Mucous membranes are moist.     Pharynx: Oropharynx is clear.  Eyes:     Extraocular Movements: Extraocular movements intact.     Conjunctiva/sclera: Conjunctivae normal.     Pupils: Pupils are equal, round, and reactive to light.  Cardiovascular:     Rate and Rhythm: Normal rate and regular rhythm.     Pulses: Normal pulses.     Heart sounds: Normal heart sounds.  Pulmonary:     Effort: Pulmonary effort is  normal.     Breath sounds: Normal breath sounds.  Abdominal:     General: There is no distension.     Palpations: Abdomen is soft.     Tenderness: There is no abdominal tenderness.  Musculoskeletal:     Cervical back: No tenderness.  Lymphadenopathy:     Cervical: No cervical adenopathy.  Skin:    General: Skin is warm and dry.     Capillary Refill: Capillary refill takes less than 2 seconds.  Neurological:     General: No focal deficit present.     Mental Status: He is alert and oriented to person, place, and time.  Psychiatric:        Mood and Affect: Mood normal.        Behavior: Behavior normal.      ASSESSMENT & PLAN: Problem List Items Addressed This Visit       Digestive   Crohn's disease of small intestine (HCC)   Stable.  Sees gastrointestinal doctor on a regular basis. Started on [employer] about 3 months ago      Relevant Orders   CBC with Differential/Platelet     Musculoskeletal and Integument   Arthrofibrosis of left total knee arthroplasty   Still in chronic pain to left knee Need to follow-up with orthopedist        Other   Prediabetes   Stable.  Diet and nutrition discussed. Advised to decrease amount of daily carbohydrate intake and daily calories and increase amount of plant based protein in his diet.      Relevant Orders   Hemoglobin A1c   Dyslipidemia   Stable. Diet and nutrition discussed.  The 10-year ASCVD risk score (Arnett DK, et al., 2019) is: 9.5%   Values used to calculate the score:     Age: 68 years  Sex: Male     Is Non-Hispanic African American: Yes     Diabetic: No     Tobacco smoker: No     Systolic Blood Pressure: 142 mmHg     Is BP treated: No     HDL Cholesterol: 52.9 mg/dL     Total Cholesterol: 191 mg/dL       Relevant Orders   Lipid panel   Elevated blood-pressure reading without diagnosis of hypertension   Elevated blood pressure reading in the office Advised to monitor blood pressure readings at home  daily for the next several weeks and keep a log.  Advised to contact the office if numbers persistently abnormal.  May need to be started on antihypertensive medication.  Cardiovascular risks associated with hypertension discussed.  Dietary approaches to stop hypertension discussed.  Benefits of exercise discussed.      Lower urinary tract symptoms   Active and affecting quality of life.  PSA done today.  Recommend Flomax 0.4 mg at bedtime      Relevant Medications   tamsulosin (FLOMAX) 0.4 MG CAPS capsule   Suspected sleep apnea   Patient snores loudly, sometimes wakes up with headaches, has general malaise and occasional daytime somnolence No quality sleep May have obstructive sleep apnea Recommend sleep studies Referral placed today      Relevant Orders   Ambulatory referral to Sleep Studies   Other Visit Diagnoses       Encounter for general adult medical examination with abnormal findings    -  Primary   Relevant Orders   CBC with Differential/Platelet   Comprehensive metabolic panel   Hemoglobin A1c   PSA   Lipid panel     Screening for deficiency anemia       Relevant Orders   CBC with Differential/Platelet     Screening for endocrine, metabolic and immunity disorder       Relevant Orders   Comprehensive metabolic panel     Screening for prostate cancer       Relevant Orders   PSA      Modifiable risk factors discussed with patient. Anticipatory guidance according to age provided. The following topics were also discussed: Social Determinants of Health Smoking.  Non-smoker Diet and nutrition Benefits of exercise Cancer screening and review of colonoscopy report from 2023. Vaccinations review and recommendations Cardiovascular risk assessment and need for blood work Mental health including depression and anxiety Fall and accident prevention  Patient Instructions  Health Maintenance, Male Adopting a healthy lifestyle and getting preventive care are  important in promoting health and wellness. Ask your health care provider about: The right schedule for you to have regular tests and exams. Things you can do on your own to prevent diseases and keep yourself healthy. What should I know about diet, weight, and exercise? Eat a healthy diet  Eat a diet that includes plenty of vegetables, fruits, low-fat dairy products, and lean protein. Do not eat a lot of foods that are high in solid fats, added sugars, or sodium. Maintain a healthy weight Body mass index (BMI) is a measurement that can be used to identify possible weight problems. It estimates body fat based on height and weight. Your health care provider can help determine your BMI and help you achieve or maintain a healthy weight. Get regular exercise Get regular exercise. This is one of the most important things you can do for your health. Most adults should: Exercise for at least 150 minutes each week. The exercise should  increase your heart rate and make you sweat (moderate-intensity exercise). Do strengthening exercises at least twice a week. This is in addition to the moderate-intensity exercise. Spend less time sitting. Even light physical activity can be beneficial. Watch cholesterol and blood lipids Have your blood tested for lipids and cholesterol at 60 years of age, then have this test every 5 years. You may need to have your cholesterol levels checked more often if: Your lipid or cholesterol levels are high. You are older than 60 years of age. You are at high risk for heart disease. What should I know about cancer screening? Many types of cancers can be detected early and may often be prevented. Depending on your health history and family history, you may need to have cancer screening at various ages. This may include screening for: Colorectal cancer. Prostate cancer. Skin cancer. Lung cancer. What should I know about heart disease, diabetes, and high blood pressure? Blood  pressure and heart disease High blood pressure causes heart disease and increases the risk of stroke. This is more likely to develop in people who have high blood pressure readings or are overweight. Talk with your health care provider about your target blood pressure readings. Have your blood pressure checked: Every 3-5 years if you are 35-29 years of age. Every year if you are 64 years old or older. If you are between the ages of 92 and 55 and are a current or former smoker, ask your health care provider if you should have a one-time screening for abdominal aortic aneurysm (AAA). Diabetes Have regular diabetes screenings. This checks your fasting blood sugar level. Have the screening done: Once every three years after age 21 if you are at a normal weight and have a low risk for diabetes. More often and at a younger age if you are overweight or have a high risk for diabetes. What should I know about preventing infection? Hepatitis B If you have a higher risk for hepatitis B, you should be screened for this virus. Talk with your health care provider to find out if you are at risk for hepatitis B infection. Hepatitis C Blood testing is recommended for: Everyone born from [birth year] through [birth year]. Anyone with known risk factors for hepatitis C. Sexually transmitted infections (STIs) You should be screened each year for STIs, including gonorrhea and chlamydia, if: You are sexually active and are younger than 60 years of age. You are older than 60 years of age and your health care provider tells you that you are at risk for this type of infection. Your sexual activity has changed since you were last screened, and you are at increased risk for chlamydia or gonorrhea. Ask your health care provider if you are at risk. Ask your health care provider about whether you are at high risk for HIV. Your health care provider may recommend a prescription medicine to help prevent HIV infection. If you choose to take  medicine to prevent HIV, you should first get tested for HIV. You should then be tested every 3 months for as long as you are taking the medicine. Follow these instructions at home: Alcohol use Do not drink alcohol if your health care provider tells you not to drink. If you drink alcohol: Limit how much you have to 0-2 drinks a day. Know how much alcohol is in your drink. In the [place] one drink equals one 12 oz bottle of beer (355 mL), one 5 oz glass of wine (148 mL), or one 1 oz  glass of hard liquor (44 mL). Lifestyle Do not use any products that contain nicotine or tobacco. These products include cigarettes, chewing tobacco, and vaping devices, such as e-cigarettes. If you need help quitting, ask your health care provider. Do not use street drugs. Do not share needles. Ask your health care provider for help if you need support or information about quitting drugs. General instructions Schedule regular health, dental, and eye exams. Stay current with your vaccines. Tell your health care provider if: You often feel depressed. You have ever been abused or do not feel safe at home. Summary Adopting a healthy lifestyle and getting preventive care are important in promoting health and wellness. Follow your health care provider's instructions about healthy diet, exercising, and getting tested or screened for diseases. Follow your health care provider's instructions on monitoring your cholesterol and blood pressure. This information is not intended to replace advice given to you by your health care provider. Make sure you discuss any questions you have with your health care provider. Document Revised: 10/03/2020 Document Reviewed: 10/03/2020 Elsevier Patient Education  2024 Elsevier Inc.      Edwina Barth, MD [facility]

## 2023-07-09 NOTE — Assessment & Plan Note (Signed)
Patient snores loudly, sometimes wakes up with headaches, has general malaise and occasional daytime somnolence No quality sleep May have obstructive sleep apnea Recommend sleep studies Referral placed today

## 2023-07-09 NOTE — Assessment & Plan Note (Signed)
Active and affecting quality of life.  PSA done today.  Recommend Flomax 0.4 mg at bedtime

## 2023-07-09 NOTE — Patient Instructions (Signed)
Health Maintenance, Male  Adopting a healthy lifestyle and getting preventive care are important in promoting health and wellness. Ask your health care provider about:  The right schedule for you to have regular tests and exams.  Things you can do on your own to prevent diseases and keep yourself healthy.  What should I know about diet, weight, and exercise?  Eat a healthy diet    Eat a diet that includes plenty of vegetables, fruits, low-fat dairy products, and lean protein.  Do not eat a lot of foods that are high in solid fats, added sugars, or sodium.  Maintain a healthy weight  Body mass index (BMI) is a measurement that can be used to identify possible weight problems. It estimates body fat based on height and weight. Your health care provider can help determine your BMI and help you achieve or maintain a healthy weight.  Get regular exercise  Get regular exercise. This is one of the most important things you can do for your health. Most adults should:  Exercise for at least 150 minutes each week. The exercise should increase your heart rate and make you sweat (moderate-intensity exercise).  Do strengthening exercises at least twice a week. This is in addition to the moderate-intensity exercise.  Spend less time sitting. Even light physical activity can be beneficial.  Watch cholesterol and blood lipids  Have your blood tested for lipids and cholesterol at 60 years of age, then have this test every 5 years.  You may need to have your cholesterol levels checked more often if:  Your lipid or cholesterol levels are high.  You are older than 61 years of age.  You are at high risk for heart disease.  What should I know about cancer screening?  Many types of cancers can be detected early and may often be prevented. Depending on your health history and family history, you may need to have cancer screening at various ages. This may include screening for:  Colorectal cancer.  Prostate cancer.  Skin cancer.  Lung  cancer.  What should I know about heart disease, diabetes, and high blood pressure?  Blood pressure and heart disease  High blood pressure causes heart disease and increases the risk of stroke. This is more likely to develop in people who have high blood pressure readings or are overweight.  Talk with your health care provider about your target blood pressure readings.  Have your blood pressure checked:  Every 3-5 years if you are 9-95 years of age.  Every year if you are 85 years old or older.  If you are between the ages of 29 and 29 and are a current or former smoker, ask your health care provider if you should have a one-time screening for abdominal aortic aneurysm (AAA).  Diabetes  Have regular diabetes screenings. This checks your fasting blood sugar level. Have the screening done:  Once every three years after age 23 if you are at a normal weight and have a low risk for diabetes.  More often and at a younger age if you are overweight or have a high risk for diabetes.  What should I know about preventing infection?  Hepatitis B  If you have a higher risk for hepatitis B, you should be screened for this virus. Talk with your health care provider to find out if you are at risk for hepatitis B infection.  Hepatitis C  Blood testing is recommended for:  Everyone born from [birth year] through 1967.  Anyone  with known risk factors for hepatitis C.  Sexually transmitted infections (STIs)  You should be screened each year for STIs, including gonorrhea and chlamydia, if:  You are sexually active and are younger than 60 years of age.  You are older than 60 years of age and your health care provider tells you that you are at risk for this type of infection.  Your sexual activity has changed since you were last screened, and you are at increased risk for chlamydia or gonorrhea. Ask your health care provider if you are at risk.  Ask your health care provider about whether you are at high risk for HIV. Your health care provider  may recommend a prescription medicine to help prevent HIV infection. If you choose to take medicine to prevent HIV, you should first get tested for HIV. You should then be tested every 3 months for as long as you are taking the medicine.  Follow these instructions at home:  Alcohol use  Do not drink alcohol if your health care provider tells you not to drink.  If you drink alcohol:  Limit how much you have to 0-2 drinks a day.  Know how much alcohol is in your drink. In the [place] one drink equals one 12 oz bottle of beer (355 mL), one 5 oz glass of wine (148 mL), or one 1 oz glass of hard liquor (44 mL).  Lifestyle  Do not use any products that contain nicotine or tobacco. These products include cigarettes, chewing tobacco, and vaping devices, such as e-cigarettes. If you need help quitting, ask your health care provider.  Do not use street drugs.  Do not share needles.  Ask your health care provider for help if you need support or information about quitting drugs.  General instructions  Schedule regular health, dental, and eye exams.  Stay current with your vaccines.  Tell your health care provider if:  You often feel depressed.  You have ever been abused or do not feel safe at home.  Summary  Adopting a healthy lifestyle and getting preventive care are important in promoting health and wellness.  Follow your health care provider's instructions about healthy diet, exercising, and getting tested or screened for diseases.  Follow your health care provider's instructions on monitoring your cholesterol and blood pressure.  This information is not intended to replace advice given to you by your health care provider. Make sure you discuss any questions you have with your health care provider.  Document Revised: 10/03/2020 Document Reviewed: 10/03/2020  Elsevier Patient Education  2024 [employer].

## 2023-07-09 NOTE — Assessment & Plan Note (Signed)
Stable.  Diet and nutrition discussed. Advised to decrease amount of daily carbohydrate intake and daily calories and increase amount of plant based protein in his diet.

## 2023-07-10 ENCOUNTER — Encounter: Payer: Self-pay | Admitting: Emergency Medicine

## 2023-07-17 ENCOUNTER — Ambulatory Visit (INDEPENDENT_AMBULATORY_CARE_PROVIDER_SITE_OTHER): Payer: 59 | Admitting: Orthopaedic Surgery

## 2023-07-17 ENCOUNTER — Telehealth: Payer: Self-pay

## 2023-07-17 ENCOUNTER — Encounter: Payer: Self-pay | Admitting: Orthopaedic Surgery

## 2023-07-17 DIAGNOSIS — M1711 Unilateral primary osteoarthritis, right knee: Secondary | ICD-10-CM | POA: Diagnosis not present

## 2023-07-17 NOTE — Telephone Encounter (Signed)
 er

## 2023-07-17 NOTE — Progress Notes (Signed)
HPI: Mr. Bring returns today wanting his right knee drained.  He was seen [phone] knee was aspirated and cortisone injection was given.  States the cortisone injection helped for about 2 weeks.  He also notes that fluid did not reaccumulate for 2 weeks.  He has been back in the gym working out.  He can someone use still recovering from left total knee arthroplasty 02/22/2023.  Left knee is doing well.  Known osteoarthritis right knee.  Radiographs right knee shows moderate narrowing medial joint line mild lateral compartmental changes.  Mild to moderate patellofemoral changes.  He is tried over-the-counter NSAIDs without significant relief.  He has also tried bracing.  Review of systems: See HPI  Physical exam: Left knee examination pain.  Right knee positive effusion no abnormal warmth erythema.  Otherwise good range of motion.  Right knee crepitus noted on aspirated total 50 cc of yellow fluid obtained.  Impression: Right knee osteoarthritis  Plan: Given patient's failure of conservative treatment recommend viscosupplementation injection right knee.  We will gain approval for this and have him back once the injection is available.  Discussed with him that low impact quad strengthening exercises.  Questions were encouraged and answered at length.

## 2023-07-17 NOTE — Telephone Encounter (Signed)
 Right knee gel injection

## 2023-08-06 NOTE — Telephone Encounter (Signed)
VOB has been submitted for Monovisc, right knee.

## 2023-12-23 ENCOUNTER — Other Ambulatory Visit (HOSPITAL_COMMUNITY): Payer: Self-pay

## 2023-12-23 ENCOUNTER — Telehealth: Payer: Self-pay

## 2023-12-23 NOTE — Telephone Encounter (Signed)
 Pharmacy Patient Advocate Encounter   Received notification from Patient Insurance that prior authorization for Skyrizi  is required/requested.   Insurance verification completed.   The patient is insured through [employer] .   Per test claim: xxx

## 2023-12-23 NOTE — Telephone Encounter (Signed)
 Called and spoke with patient. He reports that he is still taking the Skyrizi . He states he still takes 90 mg every 60 days and that Dr. Legrand is still the current prescriber for this medication.

## 2023-12-25 ENCOUNTER — Other Ambulatory Visit: Payer: Self-pay

## 2023-12-25 MED ORDER — SKYRIZI 360 MG/2.4ML ~~LOC~~ SOCT: 360.0000 mg | SUBCUTANEOUS | 6 refills | Status: DC

## 2023-12-25 NOTE — Telephone Encounter (Signed)
 Reviewed patient's current and past medication records. Noted documentation of patient's induction infusions for 04/03/23, 05/01/23, and 06/14/23. No documentation found for Skyrizi  OBI orders. [facility]. Verified that patient has been receiving Skyrizi  injection with the dosage of 180 mg/1.2 ml. Reports patient last filled on 11/01/2023. While on the phone with the pharmacy tech, she ran a test claim to determine if a PA was needed, and verified that no PA was needed at this time.

## 2023-12-25 NOTE — Telephone Encounter (Signed)
 Patient is due for 6 month f/u appointment with Dr. Legrand. Attempted to reach patient to schedule. No answer. Left vm for patient to return call. Will also send my chart message.

## 2023-12-25 NOTE — Telephone Encounter (Signed)
 Thanks for working on that and for sending me an update.  Please see my last note that I would like to increase his maintenance dose of Skyrizi  to 360 mg every 8 weeks.  - H. Danis

## 2023-12-25 NOTE — Telephone Encounter (Signed)
 Orders placed for Skyrizi  360 mg.

## 2023-12-25 NOTE — Telephone Encounter (Signed)
 Documentation of this patient's Skyrizi  treatment is all over his chart with phone notes from September 2024 and my clinic note from January 2025 (which also includes his very clear indication for this medication).  He received his Skyrizi  induction infusions late 2024 in early 2025 and subsequently started maintenance injections of 180 mg every 8 weeks.  Now that he is stable on that dosing, it needs to be increased to 360 mg every 8 weeks for optimal dosing.  If there is an outstanding prior authorization for this then I am unaware of it and it needs to be attended to ASAP to avoid interruptions in his therapy and potential flare of his condition.   -Victory Brand, MD

## 2023-12-26 ENCOUNTER — Other Ambulatory Visit (HOSPITAL_COMMUNITY): Payer: Self-pay

## 2023-12-26 ENCOUNTER — Other Ambulatory Visit: Payer: Self-pay

## 2023-12-26 MED ORDER — SKYRIZI 360 MG/2.4ML ~~LOC~~ SOCT: 360.0000 mg | SUBCUTANEOUS | 6 refills | Status: AC

## 2023-12-26 NOTE — Telephone Encounter (Signed)
 Called patient. Discussed increasing Skyrizi  dose. Patient verbalized understanding. Also discussed with patient scheduling 6 month f/u. Pt in agreement to schedule. Appt scheduled for 01/01/2024 at 1420

## 2023-12-26 NOTE — Telephone Encounter (Signed)
 Patient insurance will not allow fill at [facility]. Please cancel and send to original pharmacy, [facility]

## 2023-12-26 NOTE — Telephone Encounter (Signed)
 Medication resent to [facility].

## 2024-01-01 ENCOUNTER — Encounter: Payer: Self-pay | Admitting: Gastroenterology

## 2024-01-01 ENCOUNTER — Ambulatory Visit: Admitting: Gastroenterology

## 2024-01-01 VITALS — BP 130/80 | HR 78 | Ht 73.0 in | Wt 229.0 lb

## 2024-01-01 DIAGNOSIS — Z79620 Long term (current) use of immunosuppressive biologic: Secondary | ICD-10-CM | POA: Diagnosis not present

## 2024-01-01 DIAGNOSIS — Z7960 Long term (current) use of unspecified immunomodulators and immunosuppressants: Secondary | ICD-10-CM

## 2024-01-01 DIAGNOSIS — K5000 Crohn's disease of small intestine without complications: Secondary | ICD-10-CM

## 2024-01-01 NOTE — Patient Instructions (Signed)
 You will be due for a recall colonoscopy in December 2025. We will send you a reminder in the mail when it gets closer to that time.   _______________________________________________________  If your blood pressure at your visit was 140/90 or greater, please contact your primary care physician to follow up on this.  _______________________________________________________  If you are age 60 or older, your body mass index should be between 23-30. Your Body mass index is 30.21 kg/m. If this is out of the aforementioned range listed, please consider follow up with your Primary Care Provider.  If you are age 100 or younger, your body mass index should be between 19-25. Your Body mass index is 30.21 kg/m. If this is out of the aformentioned range listed, please consider follow up with your Primary Care Provider.   ________________________________________________________  The [facility] providers would like to encourage you to use MYCHART to communicate with providers for non-urgent requests or questions.  Due to long hold times on the telephone, sending your provider a message by [facility] may be a faster and more efficient way to get a response.  Please allow 48 business hours for a response.  Please remember that this is for non-urgent requests.  _______________________________________________________  [facility] is using a team-based approach to care.  Your team is made up of your doctor and two to three APPS. Our APPS (Nurse Practitioners and Physician Assistants) work with your physician to ensure care continuity for you. They are fully qualified to address your health concerns and develop a treatment plan. They communicate directly with your gastroenterologist to care for you. Seeing the Advanced Practice Practitioners on your physician's team can help you by facilitating care more promptly, often allowing for earlier appointments, access to diagnostic testing, procedures, and other  specialty referrals.    Thank you for trusting me with your gastrointestinal care!    Dr. Victory Legrand [facility]

## 2024-01-01 NOTE — Progress Notes (Signed)
 Randy Wilson  Chief Complaint: Ileal Crohn's disease  Summary of GI history:  Ileal Crohn's disease presenting with giant inflammatory polyp on ileocecal valve.  Ileocecal resection, periods of time afterwards with noncompliant follow-up, even after starting Humira .  Later improved adherence to follow-up on Humira  monotherapy,, poor control on colonoscopy September 2023.  Recommended Skyrizi , insurance would approve Stelara  which was started February 2024. At office visit June 2024, he was complaining of increased diarrhea toward the latter part of his dose timeframe, fecal calprotectin elevated at 205, trough Stelara  level undetected with elevated antibodies at 269. After multiple subsequent denials and a lengthy and frustrating appeal process, he was approved for Skyrizi  in late September 2024.    Initial Skyrizi  infusion 04/03/2023 Last colonoscopy Sept 2023 (when on Humira  monotherapy)  Subjective  HPI:  Randy Wilson is here for follow-up today, and he is glad to report that he is feeling well overall. Chart notes reflect there was some recent questions on his maintenance Skyrizi  approval.  With that, I increased his dose from 180 milligrams to 360 mg.  He denies abdominal pain, and his bowel habits are regular with formed stool he denies rectal bleeding.  Appetite is good and weight stable.  ROS: Cardiovascular:  no chest pain Respiratory: no dyspnea  The patient's Past Medical, Family and Social History were reviewed and are on file in the EMR. Past Medical History:  Diagnosis Date   Crohn's disease of small intestine (HCC) 2017   followed by dr h. danis;  s/p colon resection 11-02-2015,  on humira    History of asthma    child   History of colon polyps    History of COVID-19 05/30/2020   positive result in epicrunny nose/cough x 3 weeks all symptoms resolved   History of gout    last flare up 1 year ago per pt on 04-03-2021   OA (osteoarthritis)    Right  wrist fracture    fell off ladder per pt on 02-08-2021   Right wrist fracture, closed, initial encounter 01/22/2021    Past Surgical History:  Procedure Laterality Date   ANKLE SURGERY Left 2002   bone spurs removed   COLONOSCOPY     last one 02-25-2020  by dr h. danis   HARDWARE REMOVAL Right 04/06/2021   Procedure: Right wrist removal of hardware;  Surgeon: Alyse Agent, MD;  Location: [facility];  Service: Orthopedics;  Laterality: Right;  with local anesthesia Needs 30 minutes   KNEE CLOSED REDUCTION Left 04/11/2023   Procedure: CLOSED MANIPULATION LEFT KNEE;  Surgeon: Vernetta Lonni GRADE, MD;  Location: [facility];  Service: Orthopedics;  Laterality: Left;   LAPAROSCOPIC ILEOCECECTOMY N/A 11/02/2015   Procedure: LAPAROSCOPIC ILEOCECECTOMY;  Surgeon: Bernarda Ned, MD;  Location: [facility];  Service: General;  Laterality: N/A;   ORIF WRIST FRACTURE Right 02/09/2021   Procedure: OPEN REDUCTION INTERNAL FIXATION (ORIF) WRIST FRACTURE;  Surgeon: Alyse Agent, MD;  Location: [facility];  Service: Orthopedics;  Laterality: Right;  WITH REGIONAL   ROTATOR CUFF REPAIR Bilateral    SHOULDER ARTHROSCOPY W/ SUBACROMIAL DECOMPRESSION AND DISTAL CLAVICLE EXCISION Left 05/07/2008   @[facility]  by dr yvone;   bone spurs removed   TOTAL KNEE ARTHROPLASTY Left 02/22/2023   Procedure: LEFT TOTAL KNEE ARTHROPLASTY;  Surgeon: Vernetta Lonni GRADE, MD;  Location: [facility];  Service: Orthopedics;  Laterality: Left;    Objective:  Med list reviewed  Current Outpatient Medications:    SKYRIZI   360 MG/2.4ML SOCT, Inject 360 mg into the skin every 8 (eight) weeks., Disp: 2.4 mL, Rfl: 6   tamsulosin  (FLOMAX ) 0.4 MG CAPS capsule, Take 1 capsule (0.4 mg total) by mouth daily. (Patient not taking: Reported on 01/01/2024), Disp: 30 capsule, Rfl: 3   Vital signs in last 24 hrs: Vitals:   01/01/24 1421  BP: 130/80  Pulse: 78   Wt Readings from Last 3  Encounters:  01/01/24 229 lb (103.9 kg)  07/09/23 240 lb (108.9 kg)  06/14/23 246 lb 6.4 oz (111.8 kg)    Physical Exam  Well-appearing HEENT: sclera anicteric, oral mucosa moist without lesions Neck: supple, no thyromegaly, JVD or lymphadenopathy Cardiac: Regular without appreciable murmur,  no peripheral edema Pulm: clear to auscultation bilaterally, normal RR and effort noted Abdomen: soft, no tenderness, with active bowel sounds. No guarding or palpable hepatosplenomegaly. Skin; warm and dry, no jaundice or rash      Latest Ref Rng & Units 07/09/2023   10:31 AM 02/23/2023    3:11 AM 02/13/2023   11:30 AM  CBC  WBC 4.0 - 10.5 K/uL 6.1  13.4  6.2   Hemoglobin 13.0 - 17.0 g/dL 85.1  86.5  85.9   Hematocrit 39.0 - 52.0 % 43.4  40.5  41.9   Platelets 150.0 - 400.0 K/uL 271.0  267  245       Latest Ref Rng & Units 07/09/2023   10:31 AM 02/23/2023    3:11 AM 02/13/2023   11:30 AM  CMP  Glucose 70 - 99 mg/dL 897  862  889   BUN 6 - 23 mg/dL 12  12  15    Creatinine 0.40 - 1.50 mg/dL 9.10  9.27  9.08   Sodium 135 - 145 mEq/L 140  134  140   Potassium 3.5 - 5.1 mEq/L 4.3  4.4  4.0   Chloride 96 - 112 mEq/L 103  101  106   CO2 19 - 32 mEq/L 30  23  23    Calcium 8.4 - 10.5 mg/dL 9.7  8.8  9.2   Total Protein 6.0 - 8.3 g/dL 7.8     Total Bilirubin 0.2 - 1.2 mg/dL 0.7     Alkaline Phos 39 - 117 U/L 70     AST 0 - 37 U/L 18     ALT 0 - 53 U/L 19       No more recent data  _____________________________________________ Assessment & Plan  Assessment: Encounter Diagnoses  Name Primary?   Crohn's disease of small intestine without complication (HCC) Yes   Long-term use of immunosuppressant medication    Good symptomatic control of his postsurgical ileal Crohn's disease on Skyrizi  monotherapy.  Will be increasing the dose based on current practice guidelines  I would like him to have a colonoscopy to assess disease activity toward the end of this year after he is on the higher  dose at least a few months.  I put him in for recall, and advised him to put a reminder in his calendar to call us  around Thanksgiving if he has not yet heard from us  about that.  He will also get a flu shot this fall   Victory LITTIE Brand III

## 2024-01-17 NOTE — Telephone Encounter (Signed)
 Pharmacy Patient Advocate Encounter  Received notification from [facility] that Prior Authorization for Skyrizi  360mg  + 180mg  + 150mg  has been APPROVED from 01-16-2024 to 01-15-2025

## 2024-02-19 ENCOUNTER — Ambulatory Visit: Payer: 59 | Admitting: Orthopaedic Surgery

## 2024-03-16 ENCOUNTER — Ambulatory Visit: Payer: Self-pay

## 2024-03-16 NOTE — Telephone Encounter (Signed)
 FYI Only or Action Required?: FYI only for provider.  Patient was last seen in primary care on 07/09/2023 by Purcell Emil Schanz, MD.  Called Nurse Triage reporting Dizziness.  Symptoms began several weeks ago.  Interventions attempted: Rest, hydration, or home remedies.  Symptoms are: unchanged.  Triage Disposition: See Physician Within 24 Hours  Patient/caregiver understands and will follow disposition?: Yes       Copied from CRM #[id number]. Topic: Clinical - Red Word Triage >> Mar 16, 2024 12:59 PM Zy'onna H wrote: Red Word that prompted transfer to Nurse Triage:  Patient experiencing dizziness for over 2 weeks now Losing balance  Looking to schedule an Appt.    **Transf. to NT** Reason for Disposition  [1] MODERATE dizziness (e.g., interferes with normal activities) AND [2] has NOT been evaluated by doctor (or NP/PA) for this  (Exception: Dizziness caused by heat exposure, sudden standing, or poor fluid intake.)  Answer Assessment - Initial Assessment Questions 1. DESCRIPTION: Describe your dizziness.     It's like a stand up or lay down I experience dizziness 2. LIGHTHEADED: Do you feel lightheaded? (e.g., somewhat faint, woozy, weak upon standing)     woozy 3. VERTIGO: Do you feel like either you or the room is spinning or tilting? (i.e., vertigo)     denies 4. SEVERITY: How bad is it?  Do you feel like you are going to faint? Can you stand and walk?     Is able to walk still 5. ONSET:  When did the dizziness begin?     2 weeks 6. AGGRAVATING FACTORS: Does anything make it worse? (e.g., standing, change in head position)     Change in head position, laying to standing. 7. HEART RATE: Can you tell me your heart rate? How many beats in 15 seconds?  (Note: Not all patients can do this.)       N/a 8. CAUSE: What do you think is causing the dizziness? (e.g., decreased fluids or food, diarrhea, emotional distress, heat exposure, new medicine,  sudden standing, vomiting; unknown)     unknown 9. RECURRENT SYMPTOM: Have you had dizziness before? If Yes, ask: When was the last time? What happened that time?     denies 10. OTHER SYMPTOMS: Do you have any other symptoms? (e.g., fever, chest pain, vomiting, diarrhea, bleeding)       Intermittent frontal H/a 2-3/10. Denies other sx. 11. PREGNANCY: Is there any chance you are pregnant? When was your last menstrual period?       N/a  Protocols used: Dizziness - Lightheadedness-A-AH

## 2024-03-17 ENCOUNTER — Ambulatory Visit: Admitting: Emergency Medicine

## 2024-03-17 ENCOUNTER — Ambulatory Visit: Payer: Self-pay | Admitting: Emergency Medicine

## 2024-03-17 ENCOUNTER — Encounter: Payer: Self-pay | Admitting: Emergency Medicine

## 2024-03-17 VITALS — BP 130/88 | HR 62 | Temp 98.0°F | Ht 74.0 in | Wt 242.0 lb

## 2024-03-17 DIAGNOSIS — Z125 Encounter for screening for malignant neoplasm of prostate: Secondary | ICD-10-CM

## 2024-03-17 DIAGNOSIS — E785 Hyperlipidemia, unspecified: Secondary | ICD-10-CM

## 2024-03-17 DIAGNOSIS — R7303 Prediabetes: Secondary | ICD-10-CM

## 2024-03-17 DIAGNOSIS — R29818 Other symptoms and signs involving the nervous system: Secondary | ICD-10-CM

## 2024-03-17 DIAGNOSIS — K5000 Crohn's disease of small intestine without complications: Secondary | ICD-10-CM

## 2024-03-17 DIAGNOSIS — R42 Dizziness and giddiness: Secondary | ICD-10-CM | POA: Diagnosis not present

## 2024-03-17 DIAGNOSIS — Z23 Encounter for immunization: Secondary | ICD-10-CM | POA: Diagnosis not present

## 2024-03-17 DIAGNOSIS — R030 Elevated blood-pressure reading, without diagnosis of hypertension: Secondary | ICD-10-CM

## 2024-03-17 LAB — CBC WITH DIFFERENTIAL/PLATELET
Basophils Absolute: 0 K/uL (ref 0.0–0.1)
Basophils Relative: 0.4 % (ref 0.0–3.0)
Eosinophils Absolute: 0.4 K/uL (ref 0.0–0.7)
Eosinophils Relative: 7.3 % — ABNORMAL HIGH (ref 0.0–5.0)
HCT: 41.6 % (ref 39.0–52.0)
Hemoglobin: 14 g/dL (ref 13.0–17.0)
Lymphocytes Relative: 26.9 % (ref 12.0–46.0)
Lymphs Abs: 1.6 K/uL (ref 0.7–4.0)
MCHC: 33.6 g/dL (ref 30.0–36.0)
MCV: 85.7 fl (ref 78.0–100.0)
Monocytes Absolute: 0.6 K/uL (ref 0.1–1.0)
Monocytes Relative: 9.8 % (ref 3.0–12.0)
Neutro Abs: 3.3 K/uL (ref 1.4–7.7)
Neutrophils Relative %: 55.6 % (ref 43.0–77.0)
Platelets: 253 K/uL (ref 150.0–400.0)
RBC: 4.85 Mil/uL (ref 4.22–5.81)
RDW: 13.7 % (ref 11.5–15.5)
WBC: 5.9 K/uL (ref 4.0–10.5)

## 2024-03-17 LAB — LIPID PANEL
Cholesterol: 176 mg/dL (ref 0–200)
HDL: 48.1 mg/dL (ref >39.00)
LDL Cholesterol: 114 mg/dL — ABNORMAL HIGH (ref 0–99)
NonHDL: 128.25
Total CHOL/HDL Ratio: 4
Triglycerides: 72 mg/dL (ref 0.0–149.0)
VLDL: 14.4 mg/dL (ref 0.0–40.0)

## 2024-03-17 LAB — COMPREHENSIVE METABOLIC PANEL WITH GFR
ALT: 16 U/L (ref 0–53)
AST: 14 U/L (ref 0–37)
Albumin: 4.5 g/dL (ref 3.5–5.2)
Alkaline Phosphatase: 59 U/L (ref 39–117)
BUN: 14 mg/dL (ref 6–23)
CO2: 29 meq/L (ref 19–32)
Calcium: 9.2 mg/dL (ref 8.4–10.5)
Chloride: 104 meq/L (ref 96–112)
Creatinine, Ser: 0.87 mg/dL (ref 0.40–1.50)
GFR: 93.66 mL/min (ref >60.00)
Glucose, Bld: 106 mg/dL — ABNORMAL HIGH (ref 70–99)
Potassium: 4.8 meq/L (ref 3.5–5.1)
Sodium: 140 meq/L (ref 135–145)
Total Bilirubin: 0.4 mg/dL (ref 0.2–1.2)
Total Protein: 7 g/dL (ref 6.0–8.3)

## 2024-03-17 LAB — VITAMIN B12: Vitamin B-12: 220 pg/mL (ref 211–911)

## 2024-03-17 LAB — PSA: PSA: 1.01 ng/mL (ref 0.10–4.00)

## 2024-03-17 LAB — VITAMIN D 25 HYDROXY (VIT D DEFICIENCY, FRACTURES): VITD: 12.47 ng/mL — ABNORMAL LOW (ref 30.00–100.00)

## 2024-03-17 LAB — TSH: TSH: 2.09 u[IU]/mL (ref 0.35–5.50)

## 2024-03-17 LAB — HEMOGLOBIN A1C: Hgb A1c MFr Bld: 5.8 % (ref 4.6–6.5)

## 2024-03-17 NOTE — Assessment & Plan Note (Signed)
 Stable. Diet and nutrition discussed.  The 10-year ASCVD risk score (Arnett DK, et al., 2019) is: 8%   Values used to calculate the score:     Age: 59 years     Clincally relevant sex: Male     Is Non-Hispanic African American: Yes     Diabetic: No     Tobacco smoker: No     Systolic Blood Pressure: 130 mmHg     Is BP treated: No     HDL Cholesterol: 53.1 mg/dL     Total Cholesterol: 176 mg/dL

## 2024-03-17 NOTE — Assessment & Plan Note (Signed)
 Patient snores loudly, sometimes wakes up with headaches, has general malaise and occasional daytime somnolence No quality sleep May have obstructive sleep apnea Recommend sleep studies Referral placed today

## 2024-03-17 NOTE — Assessment & Plan Note (Signed)
 BP Readings from Last 3 Encounters:  03/17/24 130/88  01/01/24 130/80  07/09/23 (!) 142/90  Elevated blood pressure reading in the office Advised to monitor blood pressure readings at home daily for the next several weeks and keep a log.  Advised to contact the office if numbers persistently abnormal.  May need to be started on antihypertensive medication.  Cardiovascular risks associated with hypertension discussed.  Dietary approaches to stop hypertension discussed.  Benefits of exercise discussed.

## 2024-03-17 NOTE — Assessment & Plan Note (Signed)
 Stable.  Sees gastrointestinal doctor on a regular basis. Started on Skyrizi  about 8 months ago

## 2024-03-17 NOTE — Assessment & Plan Note (Signed)
 Of 2 weeks duration.  Not improving.  Constant Clinically stable.  No red flag signs or symptoms Unknown trigger at this point. Obstructive sleep apnea may be significant contributing factor Diet and nutrition discussed Recommend blood work today Recommend sleep studies.  Referral placed today. Advised to monitor symptoms and contact the office if no better or worse during the next couple of weeks Continue monitoring blood pressure readings at home

## 2024-03-17 NOTE — Assessment & Plan Note (Signed)
 Stable.  Diet and nutrition discussed. Advised to decrease amount of daily carbohydrate intake and daily calories and increase amount of plant based protein in his diet.

## 2024-03-17 NOTE — Progress Notes (Signed)
 Randy [facility] 58 y.o.   Chief Complaint  Patient presents with   Dizziness    Started about two weeks ago. States light headidness. Wants Flu vaccine. Will get Pneumonina and Hep B vaccine.     HISTORY OF PRESENT ILLNESS: This is a 58 y.o. male A1A complaining of feeling dizzy for 2 weeks.  No other associated symptoms At times balance feels off but no falls or syncopal episodes Denies flulike symptoms No changes in medications. No lifestyle changes Does not sleep much.  4 to 5 hours per night.  Snores loudly.  Wakes up tired. No other associated symptoms History of hypertension.  Blood pressure has been within normal limits at home No other complaints or medical concerns today.  Dizziness Pertinent negatives include no abdominal pain, chest pain, chills, congestion, coughing, fever, nausea, rash, sore throat or vomiting.     Prior to Admission medications   Medication Sig Start Date End Date Taking? Authorizing Provider  SKYRIZI  360 MG/2.4ML SOCT Inject 360 mg into the skin every 8 (eight) weeks. 12/26/23  Yes Randy [facility], Randy LITTIE MOULD, [facility]  tamsulosin  (FLOMAX ) 0.4 MG CAPS capsule Take 1 capsule (0.4 mg total) by mouth daily. Patient not taking: Reported on 01/01/2024 07/09/23   Randy Randy Schanz, [facility]    Allergies  Allergen Reactions   Tramadol  Itching   Shrimp [Shellfish Allergy] Hives    Patient Active Problem List   Diagnosis Date Noted   Elevated blood-pressure reading without diagnosis of hypertension 07/09/2023   Lower urinary tract symptoms 07/09/2023   Suspected sleep apnea 07/09/2023   Arthrofibrosis of left total knee arthroplasty 04/10/2023   Status post total left knee replacement 02/22/2023   Hyperpigmented skin lesion 10/09/2022   Dyslipidemia 03/08/2022   Crohn's disease of small intestine (HCC) 03/01/2022   Residual hemorrhoidal skin tags 03/31/2020   Prediabetes 01/20/2020   Erectile dysfunction 01/20/2020   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    Asthma 04/24/2015   Hx of colonic polyp 11/08/2014   History of colonic polyps 09/17/2014    Past Medical History:  Diagnosis Date   Crohn's disease of small intestine (HCC) 2017   followed by dr Randy [facility];  s/p colon resection 11-02-2015,  on humira    History of asthma    child   History of colon polyps    History of COVID-19 05/30/2020   positive result in epicrunny nose/cough x 3 weeks all symptoms resolved   History of gout    last flare up 1 year ago per pt on 04-03-2021   OA (osteoarthritis)    Right wrist fracture    fell off ladder per pt on 02-08-2021   Right wrist fracture, closed, initial encounter 01/22/2021    Past Surgical History:  Procedure Laterality Date   ANKLE SURGERY Left 2002   bone spurs removed   COLONOSCOPY     last one 02-25-2020  by dr Randy [facility]   HARDWARE REMOVAL Right 04/06/2021   Procedure: Right wrist removal of hardware;  Surgeon: Randy [facility];  Location: [facility];  Service: Orthopedics;  Laterality: Right;  with local anesthesia Needs 30 minutes   KNEE CLOSED REDUCTION Left 04/11/2023   Procedure: CLOSED MANIPULATION LEFT KNEE;  Surgeon: Randy Randy GRADE, [facility];  Location: [facility];  Service: Orthopedics;  Laterality: Left;   LAPAROSCOPIC ILEOCECECTOMY N/A 11/02/2015   Procedure: LAPAROSCOPIC ILEOCECECTOMY;  Surgeon: Randy Ned, [facility];  Location: [facility];  Service: General;  Laterality: N/A;   ORIF  WRIST FRACTURE Right 02/09/2021   Procedure: OPEN REDUCTION INTERNAL FIXATION (ORIF) WRIST FRACTURE;  Surgeon: Randy [facility];  Location: [facility];  Service: Orthopedics;  Laterality: Right;  WITH REGIONAL   ROTATOR CUFF REPAIR Bilateral    SHOULDER ARTHROSCOPY W/ SUBACROMIAL DECOMPRESSION AND DISTAL CLAVICLE EXCISION Left 05/07/2008   @[facility]  by dr yvone;   bone spurs removed   TOTAL KNEE ARTHROPLASTY Left 02/22/2023   Procedure: LEFT TOTAL KNEE ARTHROPLASTY;  Surgeon: Randy Randy GRADE, [facility];  Location: [facility];  Service: Orthopedics;  Laterality: Left;    Social History   Socioeconomic History   Marital status: Married    Spouse name: Randy [facility]    Number of children: 3   Years of education: Not on file   Highest education level: High school graduate  Occupational History   Occupation: Recieving Solicitor    Comment: Randy [facility]  Tobacco Use   Smoking status: Former    Current packs/day: 0.00    Average packs/day: 1 pack/day for 25.0 years (25.0 ttl pk-yrs)    Types: Cigarettes    Start date: 01/21/1981    Quit date: 01/21/2006    Years since quitting: 18.1   Smokeless tobacco: Never  Vaping Use   Vaping status: Never Used  Substance and Sexual Activity   Alcohol use: Yes    Alcohol/week: 6.0 standard drinks of alcohol    Types: 6 Cans of beer per week    Comment: [facility] beer   Drug use: Never   Sexual activity: Yes    Comment: with monogamous wife, she takes OCP  Other Topics Concern   Not on file  Social History Narrative   Pt is from [place].    Social Drivers of Corporate investment banker Strain: Low Risk  (10/24/2017)   Overall Financial Resource Strain (CARDIA)    Difficulty of Paying Living Expenses: Not hard at all  Food Insecurity: No Food Insecurity (02/22/2023)   Hunger Vital Sign    Worried About Running Out of Food in the Last Year: Never true    Ran Out of Food in the Last Year: Never true  Transportation Needs: No Transportation Needs (02/22/2023)   PRAPARE - Administrator, Civil Service (Medical): No    Lack of Transportation (Non-Medical): No  Physical Activity: Sufficiently Active (10/24/2017)   Exercise Vital Sign    Days of Exercise per Week: 4 days    Minutes of Exercise per Session: 60 min  Stress: No Stress Concern Present (10/24/2017)   [employer] of Occupational Health - Occupational Stress Questionnaire    Feeling of Stress : Not at all  Social Connections: Moderately Integrated (10/24/2017)   Social Connection  and Isolation Panel    Frequency of Communication with Friends and Family: More than three times a week    Frequency of Social Gatherings with Friends and Family: More than three times a week    Attends Religious Services: 1 to 4 times per year    Active Member of [employer] or Organizations: No    Attends Banker Meetings: Never    Marital Status: Married  Catering manager Violence: Not At Risk (02/22/2023)   Humiliation, Afraid, Rape, and Kick questionnaire    Fear of Current or Ex-Partner: No    Emotionally Abused: No    Physically Abused: No    Sexually Abused: No    Family History  Problem Relation Age of Onset   Asthma Mother 9  asthma attack    Asthma Sister    Stomach cancer Sister    Stroke Brother        Myocardial infarction   Stroke Brother    Colon cancer Neg Hx    Rectal cancer Neg Hx    Esophageal cancer Neg Hx    Colon polyps Neg Hx    Allergic rhinitis Neg Hx    Angioedema Neg Hx    Eczema Neg Hx    Urticaria Neg Hx    Liver cancer Neg Hx    Pancreatic cancer Neg Hx      Review of Systems  Constitutional: Negative.  Negative for chills and fever.  HENT: Negative.  Negative for congestion and sore throat.   Respiratory: Negative.  Negative for cough and shortness of breath.   Cardiovascular: Negative.  Negative for chest pain and palpitations.  Gastrointestinal:  Negative for abdominal pain, diarrhea, nausea and vomiting.  Genitourinary: Negative.  Negative for dysuria and hematuria.  Skin: Negative.  Negative for rash.  Neurological:  Positive for dizziness.  All other systems reviewed and are negative.   Vitals:   03/17/24 0848  BP: 130/88  Pulse: 62  Temp: 98 F (36.7 C)  SpO2: 95%    Physical Exam Vitals reviewed.  Constitutional:      Appearance: Normal appearance.  HENT:     Head: Normocephalic.     Right Ear: Tympanic membrane, ear canal and external ear normal.     Left Ear: Tympanic membrane, ear canal and external  ear normal.     Mouth/Throat:     Mouth: Mucous membranes are moist.     Pharynx: Oropharynx is clear.  Eyes:     Extraocular Movements: Extraocular movements intact.     Pupils: Pupils are equal, round, and reactive to light.  Neck:     Vascular: No carotid bruit.  Cardiovascular:     Rate and Rhythm: Normal rate and regular rhythm.     Pulses: Normal pulses.     Heart sounds: Normal heart sounds.  Pulmonary:     Effort: Pulmonary effort is normal.     Breath sounds: Normal breath sounds.  Abdominal:     Palpations: Abdomen is soft.     Tenderness: There is no abdominal tenderness.  Musculoskeletal:     Cervical back: No tenderness.     Right lower leg: No edema.     Left lower leg: No edema.  Lymphadenopathy:     Cervical: No cervical adenopathy.  Skin:    General: Skin is warm and dry.  Neurological:     General: No focal deficit present.     Mental Status: He is alert and oriented to person, place, and time.  Psychiatric:        Mood and Affect: Mood normal.        Behavior: Behavior normal.      ASSESSMENT & PLAN: A total of 40 minutes was spent with the patient and counseling/coordination of care regarding preparing for this visit, review of most recent office visit notes, review of multiple chronic medical conditions and their management, differential diagnosis of dizziness and need for blood work today, possibility of obstructive sleep apnea and need for sleep studies, review of all medications, review of most recent bloodwork results, review of health maintenance items, education on nutrition, prognosis, documentation, and need for follow up.   Problem List Items Addressed This Visit       Digestive   Crohn's disease of small  intestine (HCC)   Stable.  Sees gastrointestinal doctor on a regular basis. Started on Skyrizi  about 8 months ago        Other   Prediabetes   Stable.  Diet and nutrition discussed. Advised to decrease amount of daily carbohydrate  intake and daily calories and increase amount of plant based protein in his diet.      Dyslipidemia   Stable. Diet and nutrition discussed.  The 10-year ASCVD risk score (Arnett DK, et al., 2019) is: 8%   Values used to calculate the score:     Age: 50 years     Clincally relevant sex: Male     Is Non-Hispanic African American: Yes     Diabetic: No     Tobacco smoker: No     Systolic Blood Pressure: 130 mmHg     Is BP treated: No     HDL Cholesterol: 53.1 mg/dL     Total Cholesterol: 176 mg/dL       Elevated blood-pressure reading without diagnosis of hypertension   BP Readings from Last 3 Encounters:  03/17/24 130/88  01/01/24 130/80  07/09/23 (!) 142/90  Elevated blood pressure reading in the office Advised to monitor blood pressure readings at home daily for the next several weeks and keep a log.  Advised to contact the office if numbers persistently abnormal.  May need to be started on antihypertensive medication.  Cardiovascular risks associated with hypertension discussed.  Dietary approaches to stop hypertension discussed.  Benefits of exercise discussed.       Suspected sleep apnea   Patient snores loudly, sometimes wakes up with headaches, has general malaise and occasional daytime somnolence No quality sleep May have obstructive sleep apnea Recommend sleep studies Referral placed today      Relevant Orders   Ambulatory referral to Sleep Studies   Dizziness - Primary   Of 2 weeks duration.  Not improving.  Constant Clinically stable.  No red flag signs or symptoms Unknown trigger at this point. Obstructive sleep apnea may be significant contributing factor Diet and nutrition discussed Recommend blood work today Recommend sleep studies.  Referral placed today. Advised to monitor symptoms and contact the office if no better or worse during the next couple of weeks Continue monitoring blood pressure readings at home      Relevant Orders   Comprehensive  metabolic panel with GFR   CBC with Differential/Platelet   Hemoglobin A1c   Lipid panel   TSH   Vitamin B12   VITAMIN D 25 Hydroxy (Vit-D Deficiency, Fractures)   Other Visit Diagnoses       Screening for prostate cancer       Relevant Orders   PSA      Patient Instructions  Dizziness Dizziness is a common problem. It makes you feel unsteady or light-headed. You may feel like you're about to faint. Dizziness can lead to getting hurt if you stumble or fall. It's more common to feel dizzy if you're an older adult. Many things can cause you to feel dizzy. These include: Medicines. Dehydration. This is when there's not enough water  in your body. Illness. Follow these instructions at home: Eating and drinking  Drink enough fluid to keep your pee (urine) pale yellow. This helps keep you from getting dehydrated. Try to drink more clear fluids, such as water . Do not drink alcohol. Try to limit how much caffeine you take in. Try to limit how much salt, also called sodium, you take in. Activity Try not  to make quick movements. Stand up slowly from sitting in a chair. Steady yourself until you feel okay. In the morning, first sit up on the side of the bed. When you feel okay, hold onto something and slowly stand up. Do this until you know that your balance is okay. If you need to stand in one place for a long time, move your legs often. Tighten and relax the muscles in your legs while you're standing. Do not drive or use machines if you feel dizzy. Avoid bending down if you feel dizzy. Place items in your home so you can reach them without leaning over. Lifestyle Do not smoke, vape, or use products with nicotine or tobacco in them. If you need help quitting, talk with your health care provider. Try to lower your stress level. You can do this by using methods like yoga or meditation. Talk with your provider if you need help. General instructions Watch your dizziness for any  changes. Take your medicines only as told by your provider. Talk with your provider if you think you're dizzy because of a medicine you're taking. Tell a friend or a family member that you're feeling dizzy. If they spot any changes in your behavior, have them call your provider. Contact a health care provider if: Your dizziness doesn't go away, or you have new symptoms. Your dizziness gets worse. You feel like you may vomit. You have trouble hearing. You have a fever. You have neck pain or a stiff neck. You fall or get hurt. Get help right away if: You vomit each time you eat or drink. You have watery poop and can't eat or drink. You have trouble talking, walking, swallowing, or using your arms, hands, or legs. You feel very weak. You're bleeding. You're not thinking clearly, or you have trouble forming sentences. A friend or family member may spot this. Your vision changes, or you get a very bad headache. These symptoms may be an emergency. Call 911 right away. Do not wait to see if the symptoms will go away. Do not drive yourself to the [facility]. This information is not intended to replace advice given to you by your health care provider. Make sure you discuss any questions you have with your health care provider. Document Revised: 02/14/2023 Document Reviewed: 06/28/2022 Elsevier Patient Education  2024 Elsevier Inc.    Randy Schaumann, [facility]

## 2024-03-17 NOTE — Patient Instructions (Signed)
 Dizziness Dizziness is a common problem. It makes you feel unsteady or light-headed. You may feel like you're about to faint. Dizziness can lead to getting hurt if you stumble or fall. It's more common to feel dizzy if you're an older adult. Many things can cause you to feel dizzy. These include: Medicines. Dehydration. This is when there's not enough water in your body. Illness. Follow these instructions at home: Eating and drinking  Drink enough fluid to keep your pee (urine) pale yellow. This helps keep you from getting dehydrated. Try to drink more clear fluids, such as water. Do not drink alcohol. Try to limit how much caffeine you take in. Try to limit how much salt, also called sodium, you take in. Activity Try not to make quick movements. Stand up slowly from sitting in a chair. Steady yourself until you feel okay. In the morning, first sit up on the side of the bed. When you feel okay, hold onto something and slowly stand up. Do this until you know that your balance is okay. If you need to stand in one place for a long time, move your legs often. Tighten and relax the muscles in your legs while you're standing. Do not drive or use machines if you feel dizzy. Avoid bending down if you feel dizzy. Place items in your home so you can reach them without leaning over. Lifestyle Do not smoke, vape, or use products with nicotine or tobacco in them. If you need help quitting, talk with your health care provider. Try to lower your stress level. You can do this by using methods like yoga or meditation. Talk with your provider if you need help. General instructions Watch your dizziness for any changes. Take your medicines only as told by your provider. Talk with your provider if you think you're dizzy because of a medicine you're taking. Tell a friend or a family member that you're feeling dizzy. If they spot any changes in your behavior, have them call your provider. Contact a health care  provider if: Your dizziness doesn't go away, or you have new symptoms. Your dizziness gets worse. You feel like you may vomit. You have trouble hearing. You have a fever. You have neck pain or a stiff neck. You fall or get hurt. Get help right away if: You vomit each time you eat or drink. You have watery poop and can't eat or drink. You have trouble talking, walking, swallowing, or using your arms, hands, or legs. You feel very weak. You're bleeding. You're not thinking clearly, or you have trouble forming sentences. A friend or family member may spot this. Your vision changes, or you get a very bad headache. These symptoms may be an emergency. Call 911 right away. Do not wait to see if the symptoms will go away. Do not drive yourself to the hospital. This information is not intended to replace advice given to you by your health care provider. Make sure you discuss any questions you have with your health care provider. Document Revised: 02/14/2023 Document Reviewed: 06/28/2022 Elsevier Patient Education  2024 [employer].

## 2024-03-19 ENCOUNTER — Ambulatory Visit: Admitting: Orthopaedic Surgery

## 2024-03-30 ENCOUNTER — Encounter: Payer: Self-pay | Admitting: Radiology

## 2024-04-09 ENCOUNTER — Telehealth: Payer: Self-pay

## 2024-04-09 NOTE — Telephone Encounter (Signed)
 Copied from CRM [id number]. Topic: Clinical - Medical Advice >> Apr 09, 2024  4:05 PM Lauren C wrote: Reason for CRM: Patient calling in to request a referral to someone that could better help him figure out why he is experiencing dizziness. He says he does not know what kind of dr he needs to see and has no preferences, but says he is dizzy all day, especially after bending down, and would like to get to the bottom of it. Declined nurse triage. He was seen for this problem 10/21. Requesting call back at [phone] for next steps or referral information.

## 2024-04-15 ENCOUNTER — Ambulatory Visit: Admitting: Orthopaedic Surgery

## 2024-05-06 ENCOUNTER — Ambulatory Visit (INDEPENDENT_AMBULATORY_CARE_PROVIDER_SITE_OTHER): Admitting: Neurology

## 2024-05-06 ENCOUNTER — Encounter: Payer: Self-pay | Admitting: Neurology

## 2024-05-06 VITALS — BP 132/82 | HR 56 | Ht 74.0 in | Wt 236.6 lb

## 2024-05-06 DIAGNOSIS — G8929 Other chronic pain: Secondary | ICD-10-CM

## 2024-05-06 DIAGNOSIS — R0683 Snoring: Secondary | ICD-10-CM

## 2024-05-06 DIAGNOSIS — G4726 Circadian rhythm sleep disorder, shift work type: Secondary | ICD-10-CM | POA: Diagnosis not present

## 2024-05-06 DIAGNOSIS — R519 Headache, unspecified: Secondary | ICD-10-CM

## 2024-05-06 DIAGNOSIS — R0981 Nasal congestion: Secondary | ICD-10-CM

## 2024-05-06 DIAGNOSIS — S060X1S Concussion with loss of consciousness of 30 minutes or less, sequela: Secondary | ICD-10-CM

## 2024-05-06 MED ORDER — ALPRAZOLAM 0.5 MG PO TABS: 0.5000 mg | ORAL_TABLET | Freq: Every evening | ORAL | 0 refills | Status: DC | PRN
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
## 2024-05-13 ENCOUNTER — Other Ambulatory Visit

## 2024-05-13 DIAGNOSIS — G4726 Circadian rhythm sleep disorder, shift work type: Secondary | ICD-10-CM

## 2024-05-13 DIAGNOSIS — R0683 Snoring: Secondary | ICD-10-CM

## 2024-05-13 DIAGNOSIS — R0981 Nasal congestion: Secondary | ICD-10-CM

## 2024-05-13 DIAGNOSIS — R519 Headache, unspecified: Secondary | ICD-10-CM | POA: Diagnosis not present

## 2024-05-13 DIAGNOSIS — G8929 Other chronic pain: Secondary | ICD-10-CM | POA: Diagnosis not present

## 2024-05-13 DIAGNOSIS — S060X1S Concussion with loss of consciousness of 30 minutes or less, sequela: Secondary | ICD-10-CM

## 2024-05-13 MED ORDER — GADOBENATE DIMEGLUMINE 529 MG/ML IV SOLN
20.0000 mL | Freq: Once | INTRAVENOUS | Status: AC | PRN
  Administered 2024-05-13: 08:00:00 20 mL via INTRAVENOUS

## 2024-05-18 ENCOUNTER — Ambulatory Visit: Payer: Self-pay | Admitting: *Deleted

## 2024-05-18 DIAGNOSIS — G4733 Obstructive sleep apnea (adult) (pediatric): Secondary | ICD-10-CM

## 2024-05-18 DIAGNOSIS — R0683 Snoring: Secondary | ICD-10-CM

## 2024-05-27 ENCOUNTER — Ambulatory Visit: Admitting: Neurology

## 2024-05-27 DIAGNOSIS — G4726 Circadian rhythm sleep disorder, shift work type: Secondary | ICD-10-CM

## 2024-05-27 DIAGNOSIS — G4733 Obstructive sleep apnea (adult) (pediatric): Secondary | ICD-10-CM | POA: Diagnosis not present

## 2024-05-27 DIAGNOSIS — S060X1S Concussion with loss of consciousness of 30 minutes or less, sequela: Secondary | ICD-10-CM

## 2024-05-27 DIAGNOSIS — R0981 Nasal congestion: Secondary | ICD-10-CM

## 2024-05-27 DIAGNOSIS — R519 Headache, unspecified: Secondary | ICD-10-CM

## 2024-05-27 DIAGNOSIS — R0683 Snoring: Secondary | ICD-10-CM

## 2024-06-04 NOTE — Progress Notes (Unsigned)
 SABRA

## 2024-06-06 ENCOUNTER — Encounter: Payer: Self-pay | Admitting: Gastroenterology
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
## 2024-06-15 ENCOUNTER — Encounter: Payer: Self-pay | Admitting: Emergency Medicine

## 2024-06-15 ENCOUNTER — Ambulatory Visit: Admitting: Emergency Medicine

## 2024-06-15 VITALS — BP 124/98 | HR 63 | Temp 98.2°F | Ht 74.0 in | Wt 237.0 lb

## 2024-06-15 DIAGNOSIS — L089 Local infection of the skin and subcutaneous tissue, unspecified: Secondary | ICD-10-CM | POA: Diagnosis not present

## 2024-06-15 DIAGNOSIS — L729 Follicular cyst of the skin and subcutaneous tissue, unspecified: Secondary | ICD-10-CM | POA: Diagnosis not present

## 2024-06-15 MED ORDER — CEFADROXIL 500 MG PO CAPS: 500.0000 mg | ORAL_CAPSULE | Freq: Two times a day (BID) | ORAL | 0 refills | Status: DC

## 2024-06-15 NOTE — Assessment & Plan Note (Signed)
 Clinically stable.  No red flag signs or symptoms. Hard masses.  No fluctuation.  No signs of abscess formation. Some skin cellulitis.  Recommend to start cefadroxil  500 mg twice a day for 7 days Local topical treatment discussed. Advised to contact the office if no better or worse during the next several days

## 2024-06-15 NOTE — Progress Notes (Unsigned)
 Randy Wilson 59 y.o.   Chief Complaint  Patient presents with   Mass    Pt states that it itch and he cannot put Deoderant on and sometimes cause pain it has been there for over 2 weeks. Right armpit    HISTORY OF PRESENT ILLNESS: This is a 59 y.o. male complaining of itchy and red soft tissue masses to both axilla right more than left for the past 2 weeks No other associated symptoms No other complaints or medical concerns today.  HPI   Prior to Admission medications  Medication Sig Start Date End Date Taking? Authorizing Provider  ALPRAZolam  (XANAX ) 0.5 MG tablet Take 1 tablet (0.5 mg total) by mouth at bedtime as needed (pre sleep study and pre MRI). 05/06/24  Yes Dohmeier, Dedra, MD  cefadroxil  (DURICEF) 500 MG capsule Take 1 capsule (500 mg total) by mouth 2 (two) times daily for 7 days. 06/15/24 06/22/24 Yes Markasia Carrol, Emil Schanz, MD  SKYRIZI  360 MG/2.4ML SOCT Inject 360 mg into the skin every 8 (eight) weeks. 12/26/23  Yes Legrand Victory LITTIE DOUGLAS, MD    Allergies[1]  Patient Active Problem List   Diagnosis Date Noted   Dizziness 03/17/2024   Elevated blood-pressure reading without diagnosis of hypertension 07/09/2023   Lower urinary tract symptoms 07/09/2023   Suspected sleep apnea 07/09/2023   Arthrofibrosis of left total knee arthroplasty 04/10/2023   Status post total left knee replacement 02/22/2023   Hyperpigmented skin lesion 10/09/2022   Dyslipidemia 03/08/2022   Crohn's disease of small intestine (HCC) 03/01/2022   Residual hemorrhoidal skin tags 03/31/2020   Prediabetes 01/20/2020   Erectile dysfunction 01/20/2020   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   Asthma 04/24/2015   Hx of colonic polyp 11/08/2014   History of colonic polyps 09/17/2014    Past Medical History:  Diagnosis Date   Crohn's disease of small intestine (HCC) 2017   followed by dr h. danis;  s/p colon resection 11-02-2015,  on humira    History of asthma    child    History of colon polyps    History of COVID-19 05/30/2020   positive result in epicrunny nose/cough x 3 weeks all symptoms resolved   History of gout    last flare up 1 year ago per pt on 04-03-2021   OA (osteoarthritis)    Right wrist fracture    fell off ladder per pt on 02-08-2021   Right wrist fracture, closed, initial encounter 01/22/2021    Past Surgical History:  Procedure Laterality Date   ANKLE SURGERY Left 2002   bone spurs removed   COLONOSCOPY     last one 02-25-2020  by dr h. danis   HARDWARE REMOVAL Right 04/06/2021   Procedure: Right wrist removal of hardware;  Surgeon: Alyse Agent, MD;  Location: [facility];  Service: Orthopedics;  Laterality: Right;  with local anesthesia Needs 30 minutes   KNEE CLOSED REDUCTION Left 04/11/2023   Procedure: CLOSED MANIPULATION LEFT KNEE;  Surgeon: Vernetta Lonni GRADE, MD;  [facility];  Service: Orthopedics;  Laterality: Left;   LAPAROSCOPIC ILEOCECECTOMY N/A 11/02/2015   Procedure: LAPAROSCOPIC ILEOCECECTOMY;  Surgeon: Bernarda Ned, MD;  Location: [facility];  Service: General;  Laterality: N/A;   ORIF WRIST FRACTURE Right 02/09/2021   Procedure: OPEN REDUCTION INTERNAL FIXATION (ORIF) WRIST FRACTURE;  Surgeon: Alyse Agent, MD;  Location[facility];  Service: Orthopedics;  Laterality: Right;  WITH REGIONAL   ROTATOR CUFF REPAIR Bilateral  SHOULDER ARTHROSCOPY W/ SUBACROMIAL DECOMPRESSION AND DISTAL CLAVICLE EXCISION Left 05/07/2008   @[facility]  by dr yvone;   bone spurs removed   TOTAL KNEE ARTHROPLASTY Left 02/22/2023   Procedure: LEFT TOTAL KNEE ARTHROPLASTY;  Surgeon: Vernetta Lonni GRADE, MD;  Location: [facility];  Service: Orthopedics;  Laterality: Left;    Social History   Socioeconomic History   Marital status: Married    Spouse name: [name]    Number of children: 3   Years of education: Not on file   Highest education level: High school  graduate  Occupational History   Occupation: Recieving Solicitor    Comment: [employer]  Tobacco Use   Smoking status: Former    Current packs/day: 0.00    Average packs/day: 1 pack/day for 25.0 years (25.0 ttl pk-yrs)    Types: Cigarettes    Start date: 01/21/1981    Quit date: 01/21/2006    Years since quitting: 18.4   Smokeless tobacco: Never  Vaping Use   Vaping status: Never Used  Substance and Sexual Activity   Alcohol use: Yes    Alcohol/week: 6.0 standard drinks of alcohol    Types: 6 Cans of beer per week    Comment: [facility] beer   Drug use: Never   Sexual activity: Yes    Comment: with monogamous wife, she takes OCP  Other Topics Concern   Not on file  Social History Narrative   Pt is from [place].       1 cup of coffee weekly and 3 sodas a week    Social Drivers of Health   Tobacco Use: Medium Risk (05/06/2024)   Patient History    Smoking Tobacco Use: Former    Smokeless Tobacco Use: Never    Passive Exposure: Not on Actuary Strain: Not on file  Food Insecurity: No Food Insecurity (02/22/2023)   Hunger Vital Sign    Worried About Running Out of Food in the Last Year: Never true    Ran Out of Food in the Last Year: Never true  Transportation Needs: No Transportation Needs (02/22/2023)   PRAPARE - Administrator, Civil Service (Medical): No    Lack of Transportation (Non-Medical): No  Physical Activity: Not on file  Stress: Not on file  Social Connections: Not on file  Intimate Partner Violence: Not At Risk (02/22/2023)   Humiliation, Afraid, Rape, and Kick questionnaire    Fear of Current or Ex-Partner: No    Emotionally Abused: No    Physically Abused: No    Sexually Abused: No  Depression (PHQ2-9): Low Risk (03/17/2024)   Depression (PHQ2-9)    PHQ-2 Score: 0  Alcohol Screen: Not on file  Housing: Low Risk (02/22/2023)   Housing    Last Housing Risk Score: 0  Utilities: Not At Risk (02/22/2023)   AHC  Utilities    Threatened with loss of utilities: No  Health Literacy: Not on file    Family History  Problem Relation Age of Onset   Asthma Mother 62       asthma attack    Asthma Sister    Stomach cancer Sister    Stroke Brother        Myocardial infarction   Stroke Brother    Colon cancer Neg Hx    Rectal cancer Neg Hx    Esophageal cancer Neg Hx    Colon polyps Neg Hx    Allergic rhinitis Neg Hx    Angioedema Neg Hx  Eczema Neg Hx    Urticaria Neg Hx    Liver cancer Neg Hx    Pancreatic cancer Neg Hx    Seizures Neg Hx    Sleep apnea Neg Hx      Review of Systems  Constitutional: Negative.  Negative for chills and fever.  HENT: Negative.  Negative for congestion and sore throat.   Respiratory: Negative.  Negative for cough and shortness of breath.   Cardiovascular: Negative.  Negative for chest pain and palpitations.  Gastrointestinal:  Negative for abdominal pain, nausea and vomiting.  Skin:  Positive for itching and rash.  Neurological: Negative.  Negative for dizziness and headaches.  All other systems reviewed and are negative.   Vitals:   06/15/24 1553  BP: (!) 124/98  Pulse: 63  Temp: 98.2 F (36.8 C)  SpO2: 96%    Physical Exam Vitals reviewed.  Constitutional:      Appearance: Normal appearance.  HENT:     Head: Normocephalic.  Eyes:     Extraocular Movements: Extraocular movements intact.  Cardiovascular:     Rate and Rhythm: Normal rate.  Pulmonary:     Effort: Pulmonary effort is normal.  Skin:    General: Skin is warm and dry.     Comments: Axillae: Small nontender hard erythematous nonfluctuant masses both sides  Neurological:     Mental Status: He is alert and oriented to person, place, and time.  Psychiatric:        Mood and Affect: Mood normal.        Behavior: Behavior normal.      ASSESSMENT & PLAN: I personally spent a total of 30 minutes minutes in the care of the patient today including preparing to  see the patient, getting/reviewing separately obtained history, performing a medically appropriate exam/evaluation, counseling and educating, documenting clinical information in the EHR, coordinating care, and prognosis, need for antibiotics, and need for follow-up if no better or worse during the next several days.  Problem List Items Addressed This Visit       Musculoskeletal and Integument   Infected cyst of skin - Primary   Clinically stable.  No red flag signs or symptoms. Hard masses.  No fluctuation.  No signs of abscess formation. Some skin cellulitis.  Recommend to start cefadroxil  500 mg twice a day for 7 days Local topical treatment discussed. Advised to contact the office if no better or worse during the next several days      Relevant Medications   cefadroxil  (DURICEF) 500 MG capsule   Patient Instructions  Pocket of Fluid in the Skin (Epidermoid Cyst): What to Know  An epidermoid cyst is a small lump under your skin. The cyst contains a substance that is thick and oily. What are the causes? A blocked hair follicle. A hair curls and re-enters the skin instead of growing straight out of the skin. A blocked pore. Irritated skin. An injury to the skin. Certain conditions that are passed from parent to child. Human papillomavirus (HPV). This happens rarely when cysts occur on the bottom of the feet. Long-term sun damage to the skin. What increases the risk? Having acne. Being male. Having an injury to the skin. Being past puberty. Certain conditions that are passed down through family (genetic disorder). What are the signs or symptoms? The only sign of this type of cyst may be a small, painless lump under the skin. These cysts are usually painless, but they can get infected. Symptoms of infection may include: Redness.  Inflammation. Tenderness. Warmth. Fever. A bad-smelling substance that drains from the cyst. Pus that drains from the cyst. How is this treated? If  a cyst becomes inflamed, treatment may include: Opening and draining the cyst. Antibiotics. Shots of medicines called steroids that help lessen inflammation. Surgery to remove the cyst if it's large, painful, or could turn into cancer. Do not try to open or squeeze a cyst yourself. Follow these instructions at home: Medicines Take your medicines only as told. If you were given antibiotics, take them as told. Do not stop taking them even if you start to feel better. General instructions Keep the area around your cyst clean and dry. Wear loose, dry clothing. Avoid touching your cyst. Check your cyst every day for signs of infection. Check for: Redness, swelling, or pain. Fluid or blood. Warmth. Pus or a bad smell. Keep all follow-up visits to make sure there's no discomfort or infection. Contact a doctor if: You have any signs of infection. Your cyst doesn't get better or gets worse. You get a cyst that looks different from other cysts you've had. You have a fever. You have redness that spreads from the cyst. This information is not intended to replace advice given to you by your health care provider. Make sure you discuss any questions you have with your health care provider. Document Revised: 12/28/2022 Document Reviewed: 12/28/2022 Elsevier Patient Education  2024 Elsevier Inc.     Emil Schaumann, MD [facility]      [1] Allergies Allergen Reactions   Tramadol  Itching   Shrimp [Shellfish Allergy] Hives

## 2024-06-15 NOTE — Patient Instructions (Signed)
 Pocket of Fluid in the Skin (Epidermoid Cyst): What to Know  An epidermoid cyst is a small lump under your skin. The cyst contains a substance that is thick and oily. What are the causes? A blocked hair follicle. A hair curls and re-enters the skin instead of growing straight out of the skin. A blocked pore. Irritated skin. An injury to the skin. Certain conditions that are passed from parent to child. Human papillomavirus (HPV). This happens rarely when cysts occur on the bottom of the feet. Long-term sun damage to the skin. What increases the risk? Having acne. Being male. Having an injury to the skin. Being past puberty. Certain conditions that are passed down through family (genetic disorder). What are the signs or symptoms? The only sign of this type of cyst may be a small, painless lump under the skin. These cysts are usually painless, but they can get infected. Symptoms of infection may include: Redness. Inflammation. Tenderness. Warmth. Fever. A bad-smelling substance that drains from the cyst. Pus that drains from the cyst. How is this treated? If a cyst becomes inflamed, treatment may include: Opening and draining the cyst. Antibiotics. Shots of medicines called steroids that help lessen inflammation. Surgery to remove the cyst if it's large, painful, or could turn into cancer. Do not try to open or squeeze a cyst yourself. Follow these instructions at home: Medicines Take your medicines only as told. If you were given antibiotics, take them as told. Do not stop taking them even if you start to feel better. General instructions Keep the area around your cyst clean and dry. Wear loose, dry clothing. Avoid touching your cyst. Check your cyst every day for signs of infection. Check for: Redness, swelling, or pain. Fluid or blood. Warmth. Pus or a bad smell. Keep all follow-up visits to make sure there's no discomfort or infection. Contact a doctor if: You have  any signs of infection. Your cyst doesn't get better or gets worse. You get a cyst that looks different from other cysts you've had. You have a fever. You have redness that spreads from the cyst. This information is not intended to replace advice given to you by your health care provider. Make sure you discuss any questions you have with your health care provider. Document Revised: 12/28/2022 Document Reviewed: 12/28/2022 Elsevier Patient Education  2024 [employer].

## 2024-06-22 NOTE — Telephone Encounter (Signed)
 Hadassa Cermak D, CMA  Joylene, Bradley; Ziegler, Melissa; Tucker, Dolanda; Cain, [place] New orders have been placed for the above pt, DOB: 10/04/1965 Thanks

## 2024-06-26 ENCOUNTER — Ambulatory Visit

## 2024-06-26 VITALS — Ht 74.0 in | Wt 227.0 lb

## 2024-06-26 DIAGNOSIS — K5000 Crohn's disease of small intestine without complications: Secondary | ICD-10-CM

## 2024-06-26 MED ORDER — NA SULFATE-K SULFATE-MG SULF 17.5-3.13-1.6 GM/177ML PO SOLN: 1.0000 | Freq: Once | ORAL | 0 refills | Status: AC

## 2024-07-10 ENCOUNTER — Encounter: Admitting: Gastroenterology

## 2024-07-23 ENCOUNTER — Encounter: Admitting: Gastroenterology
